# Patient Record
Sex: Male | Born: 1940 | Race: White | Hispanic: No | Marital: Married | Smoking: Former smoker
Health system: Southern US, Community
[De-identification: ages and names within clinical notes are randomized; demographics above are authoritative.]

## PROBLEM LIST (undated history)

## (undated) DIAGNOSIS — E119 Type 2 diabetes mellitus without complications: Secondary | ICD-10-CM

## (undated) DIAGNOSIS — I2510 Atherosclerotic heart disease of native coronary artery without angina pectoris: Secondary | ICD-10-CM

## (undated) DIAGNOSIS — J45909 Unspecified asthma, uncomplicated: Secondary | ICD-10-CM

## (undated) DIAGNOSIS — J449 Chronic obstructive pulmonary disease, unspecified: Secondary | ICD-10-CM

## (undated) DIAGNOSIS — Z951 Presence of aortocoronary bypass graft: Secondary | ICD-10-CM

## (undated) DIAGNOSIS — C61 Malignant neoplasm of prostate: Secondary | ICD-10-CM

## (undated) DIAGNOSIS — E785 Hyperlipidemia, unspecified: Secondary | ICD-10-CM

## (undated) DIAGNOSIS — Z9289 Personal history of other medical treatment: Secondary | ICD-10-CM

## (undated) HISTORY — DX: Chronic obstructive pulmonary disease, unspecified: J44.9

## (undated) HISTORY — PX: TONSILLECTOMY: SUR1361

## (undated) HISTORY — PX: PROSTATE BIOPSY: SHX241

## (undated) HISTORY — DX: Hyperlipidemia, unspecified: E78.5

## (undated) HISTORY — DX: Presence of aortocoronary bypass graft: Z95.1

## (undated) HISTORY — DX: Unspecified asthma, uncomplicated: J45.909

## (undated) HISTORY — DX: Atherosclerotic heart disease of native coronary artery without angina pectoris: I25.10

## (undated) HISTORY — PX: NASAL SEPTOPLASTY W/ TURBINOPLASTY: SHX2070

## (undated) HISTORY — DX: Malignant neoplasm of prostate: C61

## (undated) HISTORY — DX: Type 2 diabetes mellitus without complications: E11.9

## (undated) HISTORY — DX: Personal history of other medical treatment: Z92.89

---

## 1999-07-21 ENCOUNTER — Ambulatory Visit (HOSPITAL_COMMUNITY): Admission: RE | Admit: 1999-07-21 | Discharge: 1999-07-21 | Payer: Self-pay | Admitting: Internal Medicine

## 2001-03-19 HISTORY — PX: CARDIAC CATHETERIZATION: SHX172

## 2001-10-06 ENCOUNTER — Encounter: Payer: Self-pay | Admitting: Cardiology

## 2001-10-06 ENCOUNTER — Encounter: Admission: RE | Admit: 2001-10-06 | Discharge: 2001-10-06 | Payer: Self-pay | Admitting: Cardiology

## 2001-10-09 ENCOUNTER — Ambulatory Visit (HOSPITAL_COMMUNITY): Admission: RE | Admit: 2001-10-09 | Discharge: 2001-10-09 | Payer: Self-pay | Admitting: Cardiology

## 2001-10-20 ENCOUNTER — Encounter: Payer: Self-pay | Admitting: Cardiology

## 2001-10-20 ENCOUNTER — Ambulatory Visit (HOSPITAL_COMMUNITY): Admission: RE | Admit: 2001-10-20 | Discharge: 2001-10-20 | Payer: Self-pay | Admitting: Cardiology

## 2006-03-19 DIAGNOSIS — Z951 Presence of aortocoronary bypass graft: Secondary | ICD-10-CM

## 2006-03-19 HISTORY — DX: Presence of aortocoronary bypass graft: Z95.1

## 2006-05-29 ENCOUNTER — Encounter: Admission: RE | Admit: 2006-05-29 | Discharge: 2006-05-29 | Payer: Self-pay | Admitting: Cardiology

## 2006-05-30 ENCOUNTER — Inpatient Hospital Stay (HOSPITAL_COMMUNITY): Admission: RE | Admit: 2006-05-30 | Discharge: 2006-06-04 | Payer: Self-pay | Admitting: Cardiology

## 2006-05-30 ENCOUNTER — Ambulatory Visit: Payer: Self-pay | Admitting: Cardiothoracic Surgery

## 2006-05-30 ENCOUNTER — Encounter: Payer: Self-pay | Admitting: Vascular Surgery

## 2006-05-31 HISTORY — PX: CORONARY ARTERY BYPASS GRAFT: SHX141

## 2006-06-21 ENCOUNTER — Ambulatory Visit: Payer: Self-pay | Admitting: Cardiothoracic Surgery

## 2006-06-21 ENCOUNTER — Encounter: Admission: RE | Admit: 2006-06-21 | Discharge: 2006-06-21 | Payer: Self-pay | Admitting: Cardiothoracic Surgery

## 2006-06-27 ENCOUNTER — Encounter (HOSPITAL_COMMUNITY): Admission: RE | Admit: 2006-06-27 | Discharge: 2006-09-25 | Payer: Self-pay | Admitting: Cardiology

## 2006-07-19 ENCOUNTER — Encounter: Admission: RE | Admit: 2006-07-19 | Discharge: 2006-07-19 | Payer: Self-pay | Admitting: Cardiology

## 2006-07-23 ENCOUNTER — Ambulatory Visit (HOSPITAL_COMMUNITY): Admission: RE | Admit: 2006-07-23 | Discharge: 2006-07-23 | Payer: Self-pay | Admitting: Cardiothoracic Surgery

## 2006-10-01 ENCOUNTER — Encounter (HOSPITAL_COMMUNITY): Admission: RE | Admit: 2006-10-01 | Discharge: 2006-12-30 | Payer: Self-pay | Admitting: Cardiology

## 2007-01-18 ENCOUNTER — Encounter (HOSPITAL_COMMUNITY): Admission: RE | Admit: 2007-01-18 | Discharge: 2007-03-18 | Payer: Self-pay | Admitting: Cardiology

## 2007-03-20 ENCOUNTER — Encounter (HOSPITAL_COMMUNITY): Admission: RE | Admit: 2007-03-20 | Discharge: 2007-06-18 | Payer: Self-pay | Admitting: Cardiology

## 2007-06-19 ENCOUNTER — Encounter (HOSPITAL_COMMUNITY): Admission: RE | Admit: 2007-06-19 | Discharge: 2007-08-17 | Payer: Self-pay | Admitting: Cardiology

## 2007-10-08 DIAGNOSIS — Z9289 Personal history of other medical treatment: Secondary | ICD-10-CM

## 2007-10-08 HISTORY — DX: Personal history of other medical treatment: Z92.89

## 2008-07-02 ENCOUNTER — Inpatient Hospital Stay (HOSPITAL_COMMUNITY): Admission: AD | Admit: 2008-07-02 | Discharge: 2008-07-06 | Payer: Self-pay | Admitting: Internal Medicine

## 2008-07-08 ENCOUNTER — Inpatient Hospital Stay (HOSPITAL_COMMUNITY): Admission: AD | Admit: 2008-07-08 | Discharge: 2008-07-13 | Payer: Self-pay | Admitting: Internal Medicine

## 2010-06-18 HISTORY — PX: OTHER SURGICAL HISTORY: SHX169

## 2010-06-21 ENCOUNTER — Other Ambulatory Visit: Payer: Self-pay | Admitting: Internal Medicine

## 2010-06-21 DIAGNOSIS — R413 Other amnesia: Secondary | ICD-10-CM

## 2010-06-28 ENCOUNTER — Ambulatory Visit
Admission: RE | Admit: 2010-06-28 | Discharge: 2010-06-28 | Disposition: A | Discharge status: Home / Self Care | Payer: Medicare Other | Source: Ambulatory Visit | Attending: Internal Medicine | Admitting: Internal Medicine

## 2010-06-28 DIAGNOSIS — R413 Other amnesia: Secondary | ICD-10-CM

## 2010-06-28 LAB — GLUCOSE, CAPILLARY
Glucose-Capillary: 117 mg/dL — ABNORMAL HIGH (ref 70–99)
Glucose-Capillary: 118 mg/dL — ABNORMAL HIGH (ref 70–99)
Glucose-Capillary: 131 mg/dL — ABNORMAL HIGH (ref 70–99)
Glucose-Capillary: 105 mg/dL — ABNORMAL HIGH (ref 70–99)
Glucose-Capillary: 111 mg/dL — ABNORMAL HIGH (ref 70–99)
Glucose-Capillary: 115 mg/dL — ABNORMAL HIGH (ref 70–99)
Glucose-Capillary: 121 mg/dL — ABNORMAL HIGH (ref 70–99)
Glucose-Capillary: 122 mg/dL — ABNORMAL HIGH (ref 70–99)
Glucose-Capillary: 124 mg/dL — ABNORMAL HIGH (ref 70–99)
Glucose-Capillary: 153 mg/dL — ABNORMAL HIGH (ref 70–99)
Glucose-Capillary: 157 mg/dL — ABNORMAL HIGH (ref 70–99)
Glucose-Capillary: 177 mg/dL — ABNORMAL HIGH (ref 70–99)
Glucose-Capillary: 98 mg/dL (ref 70–99)

## 2010-06-28 LAB — COMPREHENSIVE METABOLIC PANEL
AST: 18 U/L (ref 0–37)
Albumin: 2.7 g/dL — ABNORMAL LOW (ref 3.5–5.2)
Alkaline Phosphatase: 45 U/L (ref 39–117)
Alkaline Phosphatase: 45 U/L (ref 39–117)
BUN: 15 mg/dL (ref 6–23)
BUN: 22 mg/dL (ref 6–23)
CO2: 25 mEq/L (ref 19–32)
CO2: 26 mEq/L (ref 19–32)
Calcium: 8.1 mg/dL — ABNORMAL LOW (ref 8.4–10.5)
Calcium: 9 mg/dL (ref 8.4–10.5)
Calcium: 9.2 mg/dL (ref 8.4–10.5)
Chloride: 106 mEq/L (ref 96–112)
Chloride: 108 mEq/L (ref 96–112)
Creatinine, Ser: 0.68 mg/dL (ref 0.4–1.5)
Creatinine, Ser: 0.71 mg/dL (ref 0.4–1.5)
GFR calc Af Amer: >60 mL/min (ref >60)
GFR calc Af Amer: >60 mL/min (ref >60)
GFR calc non Af Amer: >60 mL/min (ref >60)
GFR calc non Af Amer: >60 mL/min (ref >60)
Glucose, Bld: 92 mg/dL (ref 70–99)
Potassium: 3.4 mEq/L — ABNORMAL LOW (ref 3.5–5.1)
Potassium: 4 mEq/L (ref 3.5–5.1)
Potassium: 3.9 mEq/L (ref 3.5–5.1)
Total Protein: 6.8 g/dL (ref 6.0–8.3)

## 2010-06-28 LAB — CBC
HCT: 40.6 % (ref 39.0–52.0)
HCT: 39.3 % (ref 39.0–52.0)
HCT: 40 % (ref 39.0–52.0)
Hemoglobin: 12.7 g/dL — ABNORMAL LOW (ref 13.0–17.0)
Hemoglobin: 13.4 g/dL (ref 13.0–17.0)
Hemoglobin: 13.6 g/dL (ref 13.0–17.0)
MCHC: 34.7 g/dL (ref 30.0–36.0)
MCHC: 34.8 g/dL (ref 30.0–36.0)
MCHC: 34 g/dL (ref 30.0–36.0)
Platelets: 197 10*3/uL (ref 150–400)
RBC: 4.11 MIL/uL — ABNORMAL LOW (ref 4.22–5.81)
RBC: 4.42 MIL/uL (ref 4.22–5.81)
RBC: 4.48 MIL/uL (ref 4.22–5.81)
RDW: 12.2 % (ref 11.5–15.5)
RDW: 12.5 % (ref 11.5–15.5)
RDW: 12.6 % (ref 11.5–15.5)
WBC: 7.3 10*3/uL (ref 4.0–10.5)
WBC: 8 10*3/uL (ref 4.0–10.5)

## 2010-06-28 LAB — DIFFERENTIAL
Eosinophils Absolute: 0.3 10*3/uL (ref 0.0–0.7)
Eosinophils Relative: 4 % (ref 0–5)
Lymphocytes Relative: 28 % (ref 12–46)
Lymphs Abs: 2 10*3/uL (ref 0.7–4.0)
Monocytes Absolute: 0.7 10*3/uL (ref 0.1–1.0)
Monocytes Relative: 10 % (ref 3–12)

## 2010-06-28 LAB — CULTURE, BLOOD (ROUTINE X 2): Culture: NO GROWTH

## 2010-06-28 LAB — WOUND CULTURE

## 2010-06-28 LAB — BASIC METABOLIC PANEL
Calcium: 8.6 mg/dL (ref 8.4–10.5)
GFR calc Af Amer: >60 mL/min (ref >60)
GFR calc non Af Amer: >60 mL/min (ref >60)
Glucose, Bld: 127 mg/dL — ABNORMAL HIGH (ref 70–99)
Potassium: 4 mEq/L (ref 3.5–5.1)
Sodium: 137 mEq/L (ref 135–145)

## 2010-06-28 LAB — HIGH SENSITIVITY CRP: CRP, High Sensitivity: 161.5 mg/L — ABNORMAL HIGH

## 2010-06-28 LAB — SEDIMENTATION RATE: Sed Rate: 21 mm/hr — ABNORMAL HIGH (ref 0–16)

## 2010-08-01 NOTE — Discharge Summary (Signed)
[name], [name]                ACCOUNT NO.:  [id number]   MEDICAL RECORD NO.:  [id number]          PATIENT TYPE:  INP   LOCATION:  1518                         FACILITY:  [facility]   PHYSICIAN:  Massie Maroon, MD        DATE OF BIRTH:  1941/02/25   DATE OF ADMISSION:  07/08/2008  DATE OF DISCHARGE:  07/13/2008                               DISCHARGE SUMMARY   DISCHARGE DIAGNOSES:  1. Cellulitis.  2. Infected olecranon bursa?   CONSULTATIONS:  With Dr. Bradly Bienenstock IV.   PROCEDURES:  Right elbow x-ray which showed degenerative changes of the  elbow without osteolysis suspicious for osteomyelitis, subcutaneous  edema consistent clinically with cellulitis.   HOSPITAL COURSE:  A 70 year old male with a history of CAD status post  CABG who complained of redness over the right elbow beginning June 29, 2008.  Apparently after he had fallen the prior Saturday there was a  slight warmth.  There was no history of gout, but at 1 time his former  PCP thought he might have had it.  The patient is not sure if he has had  gout in the past or not.  The patient denied any fevers or chills prior  to that time.  There was slight pain with right arm movement, and the  skin over the right elbow was draining some slight serosanguinous fluid.  This was sent from outpatient clinic for a Gram's stain and culture on  June 30, 2008.  The patient was treated with Augmentin. However, the  redness spread down his right arm into his forearm and it was slightly  swollen, so he was reevaluated on July 02, 2008 and then admitted for  IV antibiotics and treated with vancomycin and Zosyn April 16 to April  20.  During that time Gram stain cultures revealed Staphylococcus aureus  that was pansensitive except to penicillin.  The warmth and redness down  his right arm receded, and there was no swelling at the time of  discharge.  There was some slight serous drainage, but this had  decreased.  The patient had been  told not to move his right elbow  excessively. However, he was using it, and there was still some redness  surrounding the right elbow, so he was readmitted on July 08, 2008 for  persistent cellulitis, possible  olecranon bursa infection.  The patient  was restarted on vancomycin and Zosyn, and seen by Dr. Melvyn Novas on July 10, 2008 and put in a splint.  He did not require debridement of the  bursa or bursa removal.  The patient appeared to be doing better  clinically on the vancomycin and Zosyn, and will be discharged home on  Avelox again.  Wound culture on admission showed no organisms and no  growth.   PAST MEDICAL HISTORY:  1. CAD status post CABG on May 30, 2006 by Dr. Donata Clay.  2. Hypertension.  3. Hyperlipidemia.  4. Glucose intolerance.  5. Dislocation of the left elbow.  6. Cataracts.  7. BPH.  8. Cellulitis June 29, 2008.  PAST SURGICAL HISTORY:  1. CABG on May 30, 2006.  2. Deviated septum repair.  3. T and A.   SOCIAL HISTORY:  The patient was born in [place], [place], and grew up  in [place].  He is a former smoker and quit smoking in 2004, and  smoked 1 pack per day x40 years.  He does not smoke presently.  He does  not use alcohol.   FAMILY HISTORY:  Mother had diabetes.  Father died at age 23 from  Alzheimer's disease.  His father had a history of coronary artery  disease with a heart attack at age 15.  Of course, he was a smoker up to  the age of 1 and a pipe smoker until the age of 55.  His mother died at  age 16 of a stroke.  She had hypertension as well as diabetes.  Siblings:  The patient has 1 brother in good health.  Paternal  grandmother had question of CVA/dementia.  Maternal grandmother had a  stroke in the past.   ALLERGIES:  No known drug allergies.   DISCHARGE MEDICATIONS:  1. Pravastatin 40 mg p.o. q.h.s.  2. Lisinopril 5 mg p.o. daily.  3. Metformin 500 mg 1/2 p.o. daily.  4. Enteric-coated aspirin 325 mg p.o. daily.  5.  Folic acid 1 mg p.o. daily.  6. Fish oil 300 mg p.o. b.i.d.  7. Avelox 40 mg p.o. daily.   PHYSICAL EXAMINATION:  Temperature 97.7, pulse 58, blood pressure  151/88, pulse ox 96% on room air.  HEENT:  Anicteric, EOMI, no  nystagmus.  Pupils 1.5 mm, symmetric, direct, consensual, near reflexes  intact.  Mucous membranes moist.  NECK:  No JVD, no bruit, no thyromegaly.  HEART:  Regular rate and rhythm, S1, S2.  No murmurs, gallops or rubs.  LUNGS:  Clear to auscultation bilaterally.  ABDOMEN:  Soft, nontender, nondistended, positive bowel sounds.  EXTREMITIES:  No cyanosis, clubbing or edema.  Slight erythema over the  right elbow at 2 cm diameter possibly due to immobility, no warmth, no  tenderness, drainage has apparently almost ceased.  There is a pinpoint  spot of yellow on the Telfa.  NEUROLOGICAL:  Nonfocal.  LYMPH NODES:  No adenopathy.   LABORATORIES:  WBC 8.0, hemoglobin 13.4, platelet count 299.   These are labs on July 10, 2008.   BUN 13, creatinine 0.82, sodium 137, potassium 4.0.   ASSESSMENT:  1. Cellulitis.  2. Possibly infected olecranon bursa.  3. Coronary artery disease status post CABG.  4. Hypertension.  5. Hyperlipidemia.  6. Glucose intolerance.  7. History of recurrent dislocation of left elbow.  8. Cataracts.  9. Benign prostatic hypertrophy.  10.Cellulitis June 29, 2008.   PLAN:  The patient will finish IV antibiotic today, and be discharged on  Avelox 400 mg p.o. daily x1 week.  The patient will follow up with Dr.  Melvyn Novas tomorrow and his primary care physician in 1 week.  The patient  will try to continue to not move his elbow excessively.  Wife  understands how to dress his elbow.  Appreciate Dr. Glenna Durand input.      Massie Maroon, MD  Electronically Signed     JYK/MEDQ  D:  07/13/2008  T:  07/13/2008  Job:  [id number]   cc:   Madelynn Done, MD  Fax: [phone]

## 2010-08-01 NOTE — Discharge Summary (Signed)
[name], [name]                ACCOUNT NO.:  [id number]   MEDICAL RECORD NO.:  [id number]          PATIENT TYPE:  INP   LOCATION:  6734                         FACILITY:  [facility]   PHYSICIAN:  Massie Maroon, MD        DATE OF BIRTH:  29-Apr-1940   DATE OF ADMISSION:  07/02/2008  DATE OF DISCHARGE:  07/06/2008                               DISCHARGE SUMMARY   DISCHARGE DIAGNOSES:  1. Cellulitis.  2. Hypokalemia.  3. Coronary artery disease status post coronary artery bypass graft on      May 30, 2006 by Dr. Donata Clay.  4. Hypertension.  5. Hyperlipidemia.  6. Glucose intolerance.  7. Recurrent dislocation of the left elbow.  8. Cataracts.  9. Benign prostatic hypertrophy.   PROCEDURES:  None.   ALLERGIES:  No known drug allergies.   DISCHARGE MEDICATIONS:  1. Avelox 400 mg p.o. daily x7 days.  2. Pravastatin 40 mg p.o. at bedtime.  3. Lexapro 5 mg p.o. daily.  4. Metformin 500 mg p.o. daily.  5. Enteric-coated aspirin 325 mg p.o. daily.  6. Folic acid 1 mg p.o. daily.  7. Fish oil 300 mg p.o. b.i.d.   HOSPITAL COURSE:  A 70 year old male with a history of CAD, status post  coronary artery bypass graft who complained of redness over the right  elbow.  Redness began on June 29, 2008.  The patient had apparently  fallen the prior Saturday and there was slight warmth.  There was no  definitive diagnosis of gout that at one time his prior physician, Dr.  Dimas [place] thought he might have it.  There was leg pain with  right arm movement initially and the skin over the right elbow was  draining some slight serosanguineous fluid.  This was aspirated in the  clinic on June 30, 2008 and sent for Gram-stain culture and the patient  was treated with Augmentin 875 mg p.o. b.i.d., however, the redness  spread down his right arm to his forearm and the patient was reevaluated  on July 02, 2008.  The patient was then admitted for IV antibiotics  after having failed p.o.  antibiotics.  The patient was started on  vancomycin 1 gram IV b.i.d. as well as Zosyn 3.375 grams IV q.6 h.  The  patient was treated empirically while awaiting cultures.  The patient  remained afebrile through the course of hospitalization.  He was still  having some serous drainage from his right elbow but this was decreasing  throughout the course of his stay.  His Gram-stain and culture came back  positive for Staphylococcus aureus, however, it was pansensitive except  for penicillin.  The warmth and redness down his right forearm receded  and at the time of discharge, there was no apparent evidence of  cellulitis like on his forearm nor his elbow.  There was some slight  serous drainage but this was decreased markedly and almost completely  stopped.  The patient has been moving his right elbow excessively and he  has been warned not to do this so that his elbow  can heal.   PAST MEDICAL HISTORY/PAST SURGICAL HISTORY/SOCIAL HISTORY/FAMILY  HISTORY:  Please see dictated H and P on July 02, 2008.   REVIEW OF SYSTEMS:  Negative for fever, negative for all 10 organ  systems except for pertinent positives as stated above.   PHYSICAL EXAMINATION:  VITAL SIGNS:  Temperature 98.0, pulse 62, blood  pressure 143/77, and pulse ox 100% on room air.  HEENT:  Anicteric, EOMI, no nystagmus, pupils 1.5 mm, symmetric, direct,  consensual reflexes intact.  NECK:  No JVD, no bruit, no thyromegaly.  HEART:  Regular rate and rhythm.  S1-S2.  No murmurs, gallops, or rubs.  LUNGS:  Clear to auscultation bilaterally.  ABDOMEN:  Soft, obese, nontender, and nondistended.  Positive bowel  sounds.  EXTREMITIES:  No cyanosis, clubbing or edema, swelling in the right  forearm has gone down to normal.  The patient has good movement of his  elbow.  There is some slight serous drainage from his right elbow  opening where he apparently had an abrasion.  It is very minimal at this  point.  NEUROLOGIC:   Nonfocal.  LYMPH NODES:  No adenopathy.   LABORATORY DATA:  July 04, 2008, uric acid 4.1.  Sodium 138, potassium  3.4, BUN 13, and creatinine 0.71.  AST 16, ALT 15, albumin 2.7, and  calcium 8.1.  WBC 7.3, hemoglobin 12.7, and platelet count 210.  July 02, 2008, ESR 21 and CRP 161.5.   ASSESSMENT:  1. Right elbow, forearm cellulitis refractory to Augmentin p.o.      antibiotic.  2. Hypokalemia.  3. Anemia.  4. Hypertension.  5. Hyperlipidemia.  6. Glucose intolerance.  7. Coronary artery disease status post coronary artery bypass graft,      May 30, 2006 by Dr. Donata Clay.  8. History of recurrent dislocation of left elbow.  9. Cataracts.  10.Benign prostatic hypertrophy.   PLAN:  The patient will be discharged on Avelox 400 mg p.o. daily x7  days for cellulitis.  Cellulitis should be sensitive to this since it  was staph aureus pansensitive except for penicillin.  Blood cultures x2  sets were negative for growth.  The patient will try to not move his  right elbow much.  He will try to move it minimally.  He can try to keep  it in a sling for a few days.  The patient will use bacitracin or triple  antibiotic ointment or Neosporin topically and cover with Telfa and keep  it wrapped until the wound is healed.  The patient should followup with  Dr. Pearson Grippe in 1 week.     Massie Maroon, MD  Electronically Signed    JYK/MEDQ  D:  07/06/2008  T:  07/07/2008  Job:  [id number]

## 2010-08-01 NOTE — H&P (Signed)
[name], [name]                ACCOUNT NO.:  [id number]   MEDICAL RECORD NO.:  [id number]          PATIENT TYPE:  OBV   LOCATION:  6729                         FACILITY:  [facility]   PHYSICIAN:  Massie Maroon, MD        DATE OF BIRTH:  05-29-1940   DATE OF ADMISSION:  07/02/2008  DATE OF DISCHARGE:                              HISTORY & PHYSICAL   CHIEF COMPLAINT:  Cellulitis.   HISTORY OF PRESENT ILLNESS:  A 70 year old male with a history of CAD,  status post CABG, who complains of redness over the elbow.  The redness  began on June 29, 2008.  He had apparently fallen the prior Saturday.  There was a slight warmth.  There is no history of gout.  Though at one-  time, Dr. Lendell Caprice, his previous primary care physician thought he might  have it.  The patient denies any fever, chills.  There is slight pain  with right arm movement.  The skin over the right elbow was draining  some slight serosanguineous fluid.  This was aspirated in the clinic  when he was seen on June 30, 2008, and sent for Gram stain and culture.  The patient was given Augmentin on June 30, 2008, 875 mg p.o. b.i.d. to  treat the cellulitis.  However, the redness spread down his arm into his  forearm.  The patient came to clinic on July 02, 2008, for reevaluation  and was noted to have spreading cellulitis.  The patient will be  admitted for observation and IV antibiotics.   PAST MEDICAL HISTORY:  1. CAD, status post CABG in May 30, 2006, by Dr. Donata Clay.  2. Hypertension.  3. Hyperlipidemia.  4. Glucose intolerance.  5. Recurrent dislocation of the left elbow.  6. Cataracts.  7. BPH.   PAST SURGICAL HISTORY:  1. CABG on May 30, 2006.  2. Deviated septum repair.  3. T&A.   SOCIAL HISTORY:  The patient was born in [place] and grew up  in [place].  He is a former smoker, who quit smoking in 2004 and  smoked about one pack per day x40 years.  He does not smoke presently  and he does not  use alcohol.   FAMILY HISTORY:  Mother had diabetes.  Father died at age 54 from  Alzheimer disease.  His father had a history of coronary artery disease  with heart attack at age 22.  Of course, he was a smoker up to age 42  and a pipe smoker until the age of 16.  His mother died at age 66 of a  stroke.  She had hypertension as well as diabetes.  Siblings:  The  patient has one brother in good health.  A paternal grandmother had?  CVA/dementia.  Maternal grandmother had a stroke in the past.   ALLERGIES:  No known drug allergies.   MEDICATIONS:  1. Pravastatin 40 mg p.o. nightly.  2. Lisinopril 5 mg p.o. daily.  3. Metformin 500 mg 1 p.o. daily.  4. Enteric-coated aspirin 325 mg p.o. daily.  5. Folic  acid 1 mg p.o. daily.  6. Fish oil 300 mg p.o. b.i.d.   REVIEW OF SYSTEMS:  Negative for fever, chills, chest pain,  palpitations, shortness of breath, nausea, vomiting, diarrhea.  Review  of systems is negative for all 10 organ systems except for pertinent  positives stated above.   PHYSICAL EXAMINATION:  VITAL SIGNS:  Temperature afebrile, 98.2, pulse  82, blood pressure 143/80, and pulse ox 96% on room air.  HEENT:  Anicteric, EOMI, no nystagmus, pupils 1.5 mm, symmetric, direct,  consensual reflexes intact.  Mucous membranes moist.  Tongue midline.  NECK:  No JVD, no bruit, no thyromegaly.  HEART:  Midline scar.  Regular rate and rhythm.  S1 and S2.  No murmurs,  gallops, or rubs.  LUNGS:  Clear to auscultation bilaterally.  ABDOMEN:  Soft, nontender, nondistended.  Positive bowel sounds.  EXTREMITIES:  There is very minimal swelling of the right forearm,  slight erythema on the lateral and medial aspect of the forearm.  There  is a slight opening over the right elbow, which drained some  serosanguinous fluid.  There is good range of motion at the right elbow.  There is no complaint of pain with movement of the right elbow on  passive or active motion.  The patient has good  pulses, radial and ulnar  in the right wrist.  NEUROLOGIC:  No resting tremor, cranial nerves II through XII intact,  reflexes 2+, symmetric, diffuse with downgoing toes bilaterally, motor  strength 5/5 in all 4 extremities, pinprick intact, monofilament intact.  LYMPH NODES:  No adenopathy.   LABORATORY DATA:  Blood cultures x2 sets are pending.  WBC 12.3,  hemoglobin 14.1, and platelet count 197.   Sodium 141, potassium 4.0, chloride 108, bicarb 25, BUN 15, creatinine  0.68, AST 18, and ALT 23.   Gram stain culture of the right elbow fluid is still pending.   ASSESSMENT:  1. Cellulitis.  2. Hypertension.  3. Hyperlipidemia.  4. Glucose intolerance.  5. Coronary artery disease, status post coronary artery bypass graft.  6. Benign prostatic hypertrophy.  7. History of recurrent dislocation of left elbow.   PLAN:  The patient has obviously failed outpatient oral antibiotics.  The patient will be started on vanco 1 g IV q.12 h. along with Zosyn  3.375 g IV q.6 h.  Blood cultures and wound culture are pending.  We  will tailor antibiotics based on the studies.  The patient's last  fingerstick blood sugars b.i.d.  He will be covered by insulin sliding  scale.  For his hyperlipidemia, we will continue him on his Vytorin.  For his  hypertension, we will continue on lisinopril 5 mg p.o. daily.  The  patient is to remain on aspirin for his coronary artery disease.  For  DVT prophylaxis, we will use Lovenox 40 mg subcu daily.      Massie Maroon, MD  Electronically Signed     JYK/MEDQ  D:  07/02/2008  T:  07/03/2008  Job:  [id number]   cc:   Thereasa Solo. Little, M.D.

## 2010-08-01 NOTE — H&P (Signed)
[name], [name]                ACCOUNT NO.:  [id number]   MEDICAL RECORD NO.:  [id number]          PATIENT TYPE:  INP   LOCATION:  1518                         [facility]ACILITY[facility]   PHYSICIAN:  Massie Maroon, MD        DATE OF BIRTH:  04/19/1940   DATE OF ADMISSION:  07/08/2008  DATE OF DISCHARGE:                              HISTORY & PHYSICAL   CHIEF COMPLAINT:  Redness, right elbow.   HISTORY OF PRESENT ILLNESS:  A 70 year old male with a history of CAD  status post CABG who complained of redness over the right elbow.  The  redness apparently began June 29, 2008, after he had apparently fallen  the prior Saturday.  There was some slight warmth.  There was no history  of gout.  At one time, Dr. Lendell Caprice, his primary care physician,  however, thought he might have had it.  So, the patient was not sure if  he had gout or not.  The patient denied any fever or chills prior to  that time.  There was slight pain with right arm movement, and the skin  over the right elbow was draining some slight serosanguineous fluid.  This was sent from the outpatient clinic for Gram's stain and culture.  This was on June 30, 2008.  The patient was given Augmentin June 30, 2008, 875 mg p.o. b.i.d. to treat cellulitis.  However, the redness  spread down his right arm into his forearm, and it was slightly swollen.  Thus, he came back to the clinic on July 02, 2008, for reevaluation.  The patient was admitted on July 02, 2008, for spreading cellulitis  refractory to p.o. antibiotics.  The patient was treated with vancomycin  and Zosyn during his inpatient admission from April 16 to July 06, 2008.  During that time, the Gram stain culture revealed Staphylococcus  aureus which was pansensitive except to penicillin.  The warmth and  redness down his right arm receded, and there was no swelling at the  time of discharge.  There was some slight serous drainage, but this had  decreased and almost  completely stopped.  The patient had been told not  to move his right elbow excessively.  However, he has been using it  according to his wife.  She noted some redness surrounding the right  elbow, and this was persistent besides being on p.o. Avelox which he has  been taking since discharge on July 06, 2008.  The patient will be  readmitted for cellulitis, rule out elbow infection.   PAST MEDICAL HISTORY:  1. CAD status post CABG on May 30, 2006, by Dr. Donata Clay.  2. Hypertension.  3. Hyperlipidemia.  4. Glucose intolerance.  5. Recurrent dislocation of left elbow.  6. Cataracts.  7. BPH.  8. Cellulitis June 29, 2008.   PAST SURGICAL HISTORY:  1. CABG on May 30, 2006.  2. Deviated septum repair.  3. T and A.   SOCIAL HISTORY:  The patient was born in [place], [place], and grew up  in [place].  He is a  former smoker and quit smoking in 2004 and smoked  1 pack per day for 40 years.  He does not smoke presently.  He does not  use alcohol.   FAMILY HISTORY:  Mother had diabetes.  Father died at age 6 from  Alzheimer's disease.  His father had a history of coronary artery  disease with a heart attack at age 35.  Of course, he was a smoker up to  age 88 and a pipe smoker until the age of 2.  His mother died at age 28  of a stroke.  She had hypertension as well as diabetes.  Siblings:  The  patient has one brother in good health.  Paternal grandmother had  question CVA/dementia.  Maternal grandmother had stroke in the past.   ALLERGIES:  No known drug allergies.   MEDICATIONS:  1. Pravastatin 40 mg p.o. every night.  2. Lisinopril 5 mg p.o. daily.  3. Metformin 500 mg 1 p.o. daily.  4. Enteric-coated aspirin 325 mg p.o. daily.  5. Folic acid 1 mg p.o. daily.  6. Fish oil 300 mg p.o. b.i.d.  7. Avelox 40 mg p.o. daily.  8. Lexapro 5 mg p.o. daily.   REVIEW OF SYSTEMS:  Negative for all 10 organ systems except for  pertinent positives stated above.  Specifically,  no fever, no chills.   PHYSICAL EXAMINATION:  Temperature 97.7, pulse 62, blood pressure  137/82, pulse oximetry 98% on room air.  HEENT:  Anicteric, EOMI, no nystagmus, pupils 1.5 mm, symmetric, direct,  consensual, near reflex intact.  Mucous membranes moist.  NECK:  No JVD, no bruit, no thyromegaly, no adenopathy.  HEART:  Midline scar, regular rate and rhythm.  S1-S2.  No murmurs,  gallops or rubs.  LUNGS:  Clear to auscultation bilaterally.  ABDOMEN:  Is soft, nontender, nondistended.  Positive bowel sounds.  EXTREMITIES:  No cyanosis, clubbing or edema.  There is a 0.5-mm pinhole  that is draining serous fluid from the right elbow, slight redness  around the right elbow, no warmth.  The patient has good range of motion  of the right elbow, no complaints of pain with movement.  NEUROLOGIC EXAM:  Nonfocal.  LYMPH NODES:  No adenopathy.  SKIN:  A 0.75 x 1.5-cm nodule on the inner aspect of the right elbow  which feels either like a lipoma or a possible hematoma.   LABORATORY DATA:  WBC 9.7, hemoglobin 13.6, platelet count 284.  Sodium  144, potassium 3.9, chloride 109, bicarb 26, BUN 22, creatinine 0.8, AST  48, ALT 47, calcium 9.2.   ASSESSMENT:  1. Cellulitis.  2. Coronary artery disease status post coronary artery bypass grafting      on May 30, 2006.  3. Hypertension.  4. Hyperlipidemia.  5. Glucose intolerance.  6. Recurrent dislocation of the left elbow.  7. Cataracts.  8. Benign prostatic hypertrophy.   PLAN:  The patient will be restarted on vancomycin and Zosyn.  We will  obtain a wound consult as well as orthopedic consult for evaluation of  his right elbow.  We will use triple antibiotic ointment or Neosporin  topically along with Telfa to cover the wound until healed.  We  appreciate the input from our wound care team as well as orthopedic  physicians.  Will obtain an x-ray of the elbow.  For DVT prophylaxis, we  will use SCDs and TEDS.  At the present time,  the patient is ambulatory.      Massie Maroon,  MD  Electronically Signed     JYK/MEDQ  D:  07/08/2008  T:  07/08/2008  Job:  [id number]   cc:   Massie Maroon, MD  Fax: [phone]

## 2010-08-01 NOTE — Consult Note (Signed)
NAMEANTUANE, EASTRIDGE                ACCOUNT NO.:  [id number]   MEDICAL RECORD NO.:  [id number]          PATIENT TYPE:  INP   LOCATION:  1518                         FACILITY:  [facility]   PHYSICIAN:  Madelynn Done, MD  DATE OF BIRTH:  October 15, 1940   DATE OF CONSULTATION:  07/10/2008  DATE OF DISCHARGE:                                 CONSULTATION   REASON FOR INPATIENT HOSPITAL CONSULTATION:  Right elbow olecranon  bursitis.   REQUESTING PHYSICIAN:  Dr. Pearson Grippe.   HISTORY:  Mr. Tatsch is a 70 year old right-hand dominant gentleman who  presented to his physician on June 29, 2008 with a painful swollen  right elbow.  The patient was then placed on oral antibiotics.  He has  pain and swelling increasing and he represented with hot draining from  his right elbow.  He was admitted to the hospital for a short course of  IV antibiotics and care.  He was discharged from the hospital after  this, subsequently went down and responded to treatment.  He was  followed up in the office and on Thursday, July 08, 2008 was noted to  still have a small amount of serous drainage from his elbow and some  redness and was readmitted back to [facility] for evaluation.  He was continued on the IV antibiotics he had been previously on.  For  himself and his wife, the cultures grew out staph and said it was not  MRSA.  He has no history of infections of the elbow.  He has otherwise  been in good health with no other reasons for hospitalization.   I was consulted for evaluation of the elbow given that it still has not  completely resolved in terms of the small amount of drainage that is  present and the small amount of redness.   PAST MEDICAL HISTORY:  Coronary artery disease, hypertension.   PAST SURGICAL HISTORY:  Coronary artery bypass grafting, deviated septum  and tonsils and adenoids.   MEDICATIONS:  See current medication list.   ALLERGIES:  NO KNOWN DRUG ALLERGIES.   SOCIAL  HISTORY:  He and his wife both live here in [place].  He is a  nonsmoker.   REVIEW OF SYSTEMS:  No recent illnesses or hospitalization.   EXAMINATION:  He is a healthy-appearing male.  Afebrile.  Vital signs are stable and normal.  He is alert and oriented to person, place and time, in no acute  distress.   On examination of the right upper extremity, the patient does have a  small amount of focal redness around the elbow olecranon surface.  It  does not extend up the arm.  There is no lymphangitis.  He has full  elbow motion that is pain free.  He has full forearm rotation that is  pain free.  He is able to flex and extend his wrist without pain.  He is  able to extend his thumb and extend his digits.  Fingertips are warm,  well-perfused, good capillary refill, good station.  He does have a  small area, about the  size is of a pinhole, over the olecranon surface  with a small amount of serous drainage, it did not appear to be  infected.   IMPRESSION:  Right elbow, septic bursitis, improved with antibiotics  with small amount of serous drainage.   PLAN:  Today the findings were reviewed with  Mr. Prien as well as his  wife. The patient has what sounds like a septic bursitis that  spontaneously drained.  It seems as though it responded to the oral and  IV antibiotic coverage, not completely resolved.  We talked about the  options at the bedside.  We talked about continuing the antibiotics and  a short course of immobilization versus opening the elbow up and  debriding what remains of the olecranon bursa.  After talking with them  today in detail, we have elected to try a course of immobilization and  continue the oral antibiotics that he has been on.  We talked about  doing daily dressing changes and continuing with the splint.  The wife  was at the bedside and was shown how to do the dressings and splint.  She is a former Engineer, civil (consulting) and voiced understanding of this procedure.  We   talked about if it starts getting more red and more swollen or if he  starts having any subjective fevers and chills, he needs to come back  in.  They were given my contact information to reach me if there are any  problems.  If any problems arise, the patient may require further  intervention, but they would like to consider trying again finishing up  a course of nonoperative treatment to avoid any further invasive  intervention for his elbow.  All questions were answered and encouraged.  They are going to come back and see me in the office on Wednesday for  evaluation and check.   The patient can go home on oral antibiotics and they are in agreement  with this.      Madelynn Done, MD  Electronically Signed     FWO/MEDQ  D:  07/11/2008  T:  07/11/2008  Job:  [id number]   cc:   Fayrene Fearing, MD Kin

## 2010-08-04 NOTE — Consult Note (Signed)
[name], [name] NO.:  [id number]   MEDICAL RECORD NO.:  [id number]          PATIENT TYPE:  INP   LOCATION:  2308                         FACILITY:  [facility]   PHYSICIAN:  Kerin Perna, M.D.  DATE OF BIRTH:  Oct 28, 1940   DATE OF CONSULTATION:  05/30/2006  DATE OF DISCHARGE:                                 CONSULTATION   REFERRING PHYSICIAN:  Thereasa Solo. Little, M.D.   PRIMARY CARE PHYSICIAN:  Janae Bridgeman. Lendell Caprice, M.D.   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   CHIEF COMPLAINT:  Chest pain.   HISTORY OF PRESENT ILLNESS:  I was asked to evaluate this 70 year old,  white male smoker for potential surgical coronary revascularization for  recently diagnosed severe three-vessel coronary disease.  The patient  had an abnormal stress test in 2003, and had a catheterization which  showed a chronically occluded LAD, but with good collateralization and  good LV function.  He had been treated medically until recently when he  developed exertional chest pain going up stairs or hills or with  increased effort.  The stress test repeated recently showed increased  ischemic changes and he underwent elective outpatient catheterization  today by Dr. Clarene Duke.  This demonstrated his occluded LAD, but with  decreased collateralization has he had a high-grade stenosis of the  circumflex, OM-2 90% and a 70% stenosis of the OM-1.  The distal right  coronary had a 70% stenosis and overall LV function was still well-  preserved.  LVEDP was 22 and there is no evidence of aortic stenosis or  mitral regurgitation.  Based on his coronary anatomy and symptoms, he  was felt to be a candidate for surgical coronary revascularization.   PAST MEDICAL HISTORY:  1. Hypertension.  2. Hyperlipidemia.  3. Active smoker.  4. No known allergies.  5. Status post nasoseptal repair under general anesthesia.  6. Three-vessel  coronary artery disease with Class III angina.   MEDICATIONS:  1. Cozaar 50 mg daily.  2. Aspirin 81 mg daily.  3. Toprol XL 25 mg daily.  4. Vytorin 10/20 one p.o. daily.  5. Vitamin C one tablet daily.   ALLERGIES:  No known drug allergies.   SOCIAL HISTORY:  He is retired and married.  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  He smokes half pack of cigarettes a day and drinks alcohol  infrequently.   FAMILY HISTORY:  Negative for diabetes or coronary disease.   REVIEW OF SYSTEMS:  CONSTITUTIONAL:  Negative for fever or weight loss.  ENT:  Negative for dental complaints of difficulty swallowing.  THORACIC:  Negative for chest trauma or abnormal chest x-ray.  CARDIAC:  Positive for Class III exertional tightness, but no resting symptoms of  orthopnea or PND.  GI:  Negative for hepatitis, jaundice or blood per  rectum.  GU:  Negative for kidney stones or BPH.  VASCULAR:  Negative  for DVT, claudication or TIA.  NEUROLOGIC:  Negative for stroke or  seizure.  HEMATOLOGIC:  Negative for anemia or bleeding disorder.  PHYSICAL EXAMINATION:  VITAL SIGNS:  Height 5 feet 6 inches, weight 150  pounds, blood pressure 130/60, pulse 80, respirations 18.  GENERAL:  A pleasant, middle-aged male in the catheterization lab  holding area in no distress.  HEENT:  Normocephalic, full EOMs.  Pharynx clear.  NECK:  Supple without JVD, mass or carotid bruit.  LYMPHATICS:  No palpable supraclavicular or cervical adenopathy.  LUNGS:  Breath sounds are clear and equal and there is no thoracic  deformity.  CARDIAC:  Regular rate and rhythm without S3 gallop or murmur.  ABDOMEN:  Soft, nontender without pulsatile mass or organomegaly.  EXTREMITIES:  No clubbing, cyanosis or edema.  Peripheral pulses are 2+  in all extremities.  SKIN:  Without rash or lesion.  NEUROLOGIC:  Alert and oriented without focal motor deficit.   LABORATORY DATA AND X-RAY FINDINGS:  The pre-CABG Doppler exam shows no   significant carotid artery stenosis.  ABIs are greater than 1  bilaterally and brachial artery pressures are equal in each upper  extremity.   I reviewed the coronary arteriogram with Dr. Clarene Duke and I agree with  this impression of severe three-vessel disease.   IMPRESSION/RECOMMENDATIONS:  The patient will be prepared for surgical  coronary revascularization for his severe three-vessel anatomy and Class  III progressive symptoms with an abnormal stress test.  I discussed the  procedure in detail with the patent and his wife and they understand and  agree to proceed with surgery with what I feel is an informed consent.   Thank you for the consultation.      Kerin Perna, M.D.  Electronically Signed     PV/MEDQ  D:  05/30/2006  T:  05/31/2006  Job:  [id number]

## 2010-08-04 NOTE — Discharge Summary (Signed)
[place]  Patient:    [name], [name] Visit Number: [id number] MRN: [id number]          Service Type: CAT Location: [facility] 2899 10 Attending Physician:  Loreli Dollar Dictated by:   Julieanne Manson, M.D. Admit Date:  10/09/2001 Discharge Date: 10/09/2001   CC:         Cardiac Catheterization Laboratory  Raymond C. Eloise Harman., M.D.   Discharge Summary  INDICATIONS FOR PROCEDURE: The patient is a 70 year old male who had a standard screening exercise treadmill test that was positive for anterior ischemia, brought in for outpatient cardiac catheterization. He is asymptomatic.  DESCRIPTION OF PROCEDURE: The patient was prepped and draped in the usual sterile fashion exposing the right groin.  Following local anesthetic with 1% Xylocaine, the Seldinger technique was employed and a 5 [place] introducer sheath was placed in the right femoral artery.  Selective left and right coronary arteriography, ventriculography in the RAO projection, and a distal aortogram was performed.  EQUIPMENT: The 5 French Judkins configuration catheters.  COMPLICATIONS: None.  TOTAL CONTRAST USED: 110.  RESULTS: 1. Hemodynamic monitoring: Central aortic pressure 157/83, left    ventricular pressure 158/15 with no significant aortic valve gradient noted    at the time of pullback. 2. Ventriculography: Ventriculography in the RAO projection revealed    normal left ventricular systolic function. The left ventricle is    well opacified. No mitral regurgitation was seen. Of note, the    anterior wall showed normal systolic function. Ejection fraction was    approximately 55%. The end-diastolic pressure was 15.  DISTAL AORTOGRAM: The distal aortogram showed only minimal irregularities. There was no evidence of renal artery stenosis and there were mild irregularities in the iliacs.  CORONARY ARTERIOGRAPHY: Calcification was seen on fluoroscopy in  the distribution of the left main LAD and right coronary artery. 1. Left main: Normal. 2. LAD: The LAD was 100% occluded proximally at the septal perforator. There    was late filling of the distal LAD via left to left collaterals. 3. Circumflex: The circumflex was a large vessel that gave rise to a hybrid    bifurcating large OM system. There was proximal 30% narrowing in the    circumflex. After the OM bifurcated into two large limbs, both the    limbs had some proximal 40% narrowing right at the bifurcation. The    remainder of the distal vessels were free of disease. 4. Right coronary artery: The right coronary artery had an area of 50%    narrowing in its midportion where calcification was noted. The remainder    of the irregularities in the distal vessel and in the PDA and    posterolateral branches were well less than 40%.  CONCLUSIONS: 1. Normal left ventricular systolic function, in particular normal anterior    wall motion. 2. Total occlusion of the left anterior descending with left to left    collaterals. 3. Moderate disease in the right coronary artery and mild disease in the    circumflex.  At this point, the patient will be managed medically. He needs a stress Cardiolite study to make sure he does not have a large anterior ischemic burden. He is asymptomatic and should not require any type of intervention to the LAD. My only concern is whether or not the lesion in the right could be causing some degree of ischemia, although there were no right to left collaterals noted. Dictated by:   Julieanne Manson, M.D.  Attending Physician:  Loreli Dollar DD:  10/09/01 TD:  10/13/01 Job: [id number]

## 2010-08-04 NOTE — Op Note (Signed)
NAMEHERNDON, GRILL                ACCOUNT NO.:  [id number]   MEDICAL RECORD NO.:  [id number]          PATIENT TYPE:  INP   LOCATION:  2308                         FACILITY:  [facility]   PHYSICIAN:  Kerin Perna, M.D.  DATE OF BIRTH:  1941-01-18   DATE OF PROCEDURE:  05/30/2006  DATE OF DISCHARGE:                               OPERATIVE REPORT   OPERATION:  Coronary artery bypass grafting x4 (left internal mammary  artery to LAD, sequential saphenous vein graft to OM 1 and OM 2,  saphenous vein graft to posterior descending) using endoscopically  harvested saphenous vein.   PREOPERATIVE DIAGNOSIS:  Class III progressive angina with three-vessel  disease.   POSTOPERATIVE DIAGNOSIS:  Class III progressive angina with three-vessel  disease.   SURGEON:  Kerin Perna, MD   ASSISTANT:  Gershon Crane, PA-C   ANESTHESIA:  General by Dr. Sharee Holster   INDICATIONS:  The patient is a 70 year old smoker with a history of  hyperlipidemia and hypertension with known coronary disease who  presented with increased exertional angina.  A stress test showed  ischemia anteriorly and an elective cath today showed chronically  occluded LAD with high-grade stenosis of the circumflex and a moderate  stenosis of the right coronary with reduced right to left collaterals to  the occluded LAD territory.  Because of his progress disease and  symptoms, he was felt to be a candidate for surgical revascularization.   Prior to surgery, I examined the patient in the cath lab holding area  and reviewed the results of the cath with the patient and his wife.  I  discussed indications, benefits and alternatives of coronary bypass  surgery, for treatment of his three-vessel coronary disease.  I reviewed  the risks of the operation including risks of MI, CVA, bleeding, blood  transfusion requirement, wound infection, and death.  After reviewing  the issues with the patient and family, he demonstrated his  understanding and agreed to proceed with the operation under what I felt  was an informed consent.   OPERATIVE FINDINGS:  The vein was harvested endoscopically from the  right leg and was small but was adequate.  The mammary artery was a good  vessel but small with good flow.  The coronaries were heavily diseased.  The LAD was occluded down to the distal third of its length where the  anastomosis was created.  The patient did not receive any blood products  for the operation.   PROCEDURE:  The patient was brought to the operating room and operative  placed supine on the operating table.  General anesthesia was induced  under invasive hemodynamic monitoring.  The chest, abdomen and legs were  prepped with Betadine and draped as a sterile field.  A sternal incision  was made.  The the saphenous vein was harvested endoscopically from the  right leg.  The left internal mammary artery was harvested as a pedicle  graft from its origin at the subclavian vessels.  Heparin was  administered and the ACT was documented as being therapeutic.  The  sternal retractor was placed, and  the pericardium was opened and  suspended.  Pursestrings are placed in the ascending aorta and right  atrium and the patient was cannulated and placed on bypass.  The  coronary arteries were identified for grafting.  The mammary artery and  vein grafts were prepared for the distal anastomoses.  Cardioplegia  catheters were placed for both antegrade aortic and retrograde coronary  sinus cardioplegia.  The patient was cooled to 32 degrees and the aortic  crossclamp was applied.   Then 800 mL of cold blood cardioplegia was delivered to the coronaries  in split doses between the antegrade aortic and retrograde coronary  sinus catheters.  There is good cardioplegic arrest.   The distal coronary anastomoses were then performed.  The first distal  anastomosis was to the posterior descending branch on the right.  This  was a  1.5-mm vessel with proximal 70-80% stenosis.  Reverse saphenous  vein was sewn end-to-side with running 7-0 Prolene.  There is good flow  through the graft.  The second and third distal anastomoses consisted of  a sequential vein graft to the OM 1 and OM 2.  The OM 1 was a 1.5 mm  vessel with proximal 70% stenosis.  A side-to-side anastomosis was  constructed using running 7-0 Prolene.  The third distal anastomosis was  a continuation of the sequential vein graft to the OM 2.  This was a  larger vessel with a high-grade 90% stenosis and the end of the vein was  sewn end-to-side with running 7-0 Prolene with good flow through the  graft.  Cardioplegia was redosed.  The fourth distal anastomosis was the  distal third of the LAD.  Here it was a 1.5-mm vessel and was totally  occluded proximally.  The left IMA pedicle was brought through an  opening created in the left lateral pericardium.  It was brought down  onto the LAD and sewn end-to-side with running 8-0 Prolene.  There is  excellent flow through the anastomosis after briefly releasing the  pedicle bulldog clamp on the mammary pedicle.  The bulldog was reapplied  and the pedicle was secured to the epicardium.  Cardioplegia was  redosed.   While the crossclamp was still in place, 2 proximal vein anastomoses  were placed on the ascending aorta using a 4.0-mm punch running 7-0  Prolene.  Prior to tying down the final proximal anastomosis, air was  vented from the coronaries and the left side of the heart using a dose  of retrograde warm blood cardioplegia (hot shot).  The proximal  anastomosis was tied, and the crossclamp was removed.   The heart resumed a spontaneous rhythm.  The cardioplegia catheters were  removed.  Air was aspirated from the vein grafts with a 27 gauge needle.  The grafts were open; each had good flow and hemostasis was documented  at the proximal and distal anastomoses.  The patient was rewarmed to 37 degrees and  temporary pacing wires were applied.  When the patient had  been adequately reperfused, the lungs were re-expanded and the  ventilator was resumed.  The patient was weaned from bypass without  difficulty.  Cardiac output and blood pressure were stable.  Protamine  was administered without adverse reaction.  The cannulas were removed.  The mediastinum was irrigated with warm antibiotic irrigation.  The leg  incision was irrigated and closed in the standard fashion.  The superior  pericardial fat was closed over the aorta.  Two mediastinal and a left  pleural chest tube were placed and brought out through separate  incisions.  The sternum was closed with interrupted steel wire.  The  pectoralis fascia was closed in running #1 Vicryl.  The subcutaneous and  skin layers were closed in running Vicryl and sterile dressings were  applied.  Total bypass time was 120 minutes; crossclamp time was 74  minutes.      Kerin Perna, M.D.  Electronically Signed     PV/MEDQ  D:  05/30/2006  T:  06/01/2006  Job:  [id number]   cc:   Thereasa Solo. Little, M.D.

## 2010-08-04 NOTE — Cardiovascular Report (Signed)
NAMEPRYOR, GUETTLER                ACCOUNT NO.:  [id number]   MEDICAL RECORD NO.:  [id number]          PATIENT TYPE:  INP   LOCATION:  2807                         FACILITY:  [facility]   PHYSICIAN:  Thereasa Solo. Little, M.D. DATE OF BIRTH:  1940-12-02   DATE OF PROCEDURE:  05/30/2006  DATE OF DISCHARGE:                            CARDIAC CATHETERIZATION   INDICATIONS FOR TEST:  This 70 year old male has a history of smoking,  family history of coronary disease, and has hyperlipidemia and  hypertension.  He had a cardiac catheterization performed in 2003 that  showed total occlusion of the LAD with left-to-left collaterals and mild  disease in his circumflex and right system.  A standard Cardiolite study  was performed, not for symptoms, on May 28, 2006.  This showed  inferior-anterior ischemia and well preserved LV function.  The patient  is not having angina but was brought in for outpatient cardiac  catheterization because of a high-risk nuclear study.   After obtaining informed consent, the patient was prepped and draped in  the usual sterile fashion exposing the right groin.  Local anesthetic  with 1% Xylocaine, the Seldinger technique employed, and a 5-French  introducer sheath was placed in the right femoral artery.  Left and  right coronary arteriography, ventriculography in the RAO projection and  distal aortogram was performed.   COMPLICATIONS:  None.   TOTAL CONTRAST:  90 mL.   EQUIPMENT:  5-French Judkins configuration catheters.   RESULTS:  1. Hemodynamic monitoring:  Central aortic pressure was 159/83.  Left      ventricular pressure was 161/10, and there was no aortic valve      gradient at the time of pullback.  2. Ventriculography.  Ventriculography in the RAO projection using 20      mL of contrasted at 12 mL per second showed normal LV systolic      function with no wall motion abnormalities.  Ejection fraction was      55%; no mitral regurgitation was seen.   Left ventricular end-      diastolic pressure was 23.  3. Distal aortogram.  The distal aortogram done above the level of the      renal arteries using 25 mL of contrast at 15 mL per second showed      no evidence of renal artery stenosis.  No abdominal aortic      aneurysm.  No proximal iliac disease.  4. Coronary arteriography:  On fluoroscopy, calcification seen in      distribution of the left main and proximal LAD.   1. Left main that bifurcated and was free of disease.  2. LAD.  The LAD was 100% occluded proximally.  There was faint      visualization of the LAD from left-to-left collaterals and right-to-      left collaterals.  3. Circumflex.  The circumflex was a moderate-sized vessel.  Just      proximal to the bifurcation of 2 OMs, there was an area of 60%      narrowing that extended down into the ostium OM #2, and this  ostium      was subtotaled; OM #2 was a large vessel.  OM #1 had moderate      disease in the proximal portion of 20-30%.  4. Right coronary artery.  The right coronary was a dominant vessel.      In the midportion was a concentric area of 70% narrowing, and there      were mild irregularities in the distal portion of the vessel.  The      PDA and posterolateral vessel were free of disease.  There was      faint collaterals from the right appeared to the distal LAD.   CONCLUSION:  Total occlusion of the left anterior descending with  collaterals, subtotaled ostium of obtuse marginal #2 with moderate  disease in the circumflex and moderate disease in the right coronary  artery.  1. Well-preserved left ventricular systolic function.   Because of his anatomy, he needs bypass surgery.  I have discussed this  with Dr. Donata Clay who will see the patient and proceed on with surgery,  either later today or the first of the week depending on what the  patient's preference is.           ______________________________  Thereasa Solo. Little, M.D.     ABL/MEDQ  D:   05/30/2006  T:  05/31/2006  Job:  [id number]   cc:   Janae Bridgeman. Eloise Harman., M.D.  Dr. Selena Batten  Cath Lab

## 2010-08-04 NOTE — Discharge Summary (Signed)
NAMEIRVAN, TIEDT NO.:  [id number]   MEDICAL RECORD NO.:  [id number]          PATIENT TYPE:  INP   LOCATION:  2007                         FACILITY:  [facility]   PHYSICIAN:  Kerin Perna, M.D.  DATE OF BIRTH:  [birth year]   DATE OF ADMISSION:  05/30/2006  DATE OF DISCHARGE:                               DISCHARGE SUMMARY   HISTORY OF PRESENT ILLNESS:  The patient is a 70 year old male who was  admitted this hospitalization due to increasing symptoms of chest pain  for cardiac catheterization.  The patient has a known abnormal stress  test in 2003 and a catheterization done at that time showed a  chronically occluded LAD, but with good collateralization and good LV  function.  He had been treated medically up until recently when he  developed exertional chest pain going up hills or stairs or with  increased effort.  A stress test was done recently and this revealed  ischemic changes and he was admitted this hospitalization for elective  cardiac catheterization by Dr. Clarene Duke.   PAST MEDICAL HISTORY:  1. Hypertension.  2. Hyperlipidemia.  3. Active smoker.  4. No known allergies.  5. Status post nasal septal repair.  6. 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   MEDICATIONS PRIOR TO ADMISSION:  1. Cozaar 50 mg daily.  2. Aspirin 81 mg daily.  3. Toprol XL 25 mg daily.  4. Vytorin 10/20 one daily.  5. Vitamin C one tablet daily.   ALLERGIES:  No known drug allergies.   SOCIAL HISTORY/FAMILY HISTORY/REVIEW OF SYMPTOMS/PHYSICAL EXAMINATION:  Please see the history and physical done at the time of admission.   HOSPITAL COURSE:  The patient was admitted as stated for cardiac  catheterization.  This demonstrated an occluded LAD, but with decreased  collateralization and a high-grade stenosis of the circumflex, obtuse  marginal 2, 90% and a 70% stenosis of the obtuse marginal 1.  The distal  right coronary had a 70% stenosis and overall  left ventricular function  was still well preserved.  Left ventricular end-diastolic pressure was  22 and there was no evidence of aortic stenosis or mitral regurgitation.  Based on his coronary anatomy and his symptoms, he was felt to be a  candidate for surgical revascularization and he was referred to Dr.  Kathlee Nations Trigt for cardiothoracic surgical consultation.  His studies  were reviewed by Dr. Donata Clay and after evaluating the patient he  agreed that surgical revascularization was his best option at this time  and, in fact, due to the severity of the findings, it was Dr. Zenaida Niece  Trigt's opinion that he should proceed emergently.   PROCEDURE:  On May 30, 2006, he was taken to the operating room on an  emergent basis and underwent the following procedure:  Coronary artery  bypass grafting x4 with the following grafts replaced:   1. Left internal mammary artery to the LAD.  2. Sequential saphenous graft to the obtuse marginal 1 and obtuse      marginal 2 in a sequential fashion.  3. Saphenous vein graft  to the posterior descending.   Patient tolerated the procedure well and was taken to the surgical  intensive care unit in stable condition.   POSTOPERATIVE HOSPITAL COURSE:  The patient has done quite well.  He has  maintained stable hemodynamics.  All routines lines, monitors and  drainage devices were discontinued in the standard fashion.  He  initially did require some low-dose pressor support, but this was weaned  without difficulty.  He has responded well to a gentle diuresis.  He was  weaned from the ventilator without difficulty.  He has tolerated a  gradual increase in activity commensurate to the level of postoperative  convalescence standard cardiac rehabilitation post cardiac surgical  modalities.  The patient has had a postoperative atrial fibrillation.  This has been subsequently chemically cardioverted with initially  intravenous amiodarone and subsequent conversion  to oral.  He is  maintaining a normal sinus rhythm at this time.  His incisions are  healing well without evidence of infection.  He is afebrile.  Oxygen has  been weaned and he maintains good saturations on room air.  His  laboratory values do reveal a moderate postoperative anemia, but this is  stable.  Most recent hemoglobin and hematocrit dated June 03, 2006 are  8.8 and 25.6, respectively.  Currently, the patient's status is felt to  be stable for tentative discharge in the morning of June 04, 2006  pending morning round reevaluation.   INSTRUCTIONS:  The patient received written instructions in regard to  medications, activity, diet, wound care and followup.   FOLLOWUP:  Followup includes:  1. Dr. Donata Clay in three weeks.  The office will arrange this.  2. Dr. Clarene Duke in two weeks.  He is instructed to call for an      appointment.   MEDICATIONS ON DISCHARGE:  Are as follows:  1. Aspirin 325 mg daily.  2. Lopressor 25 mg twice daily.  3. Currently, ACE inhibitor is on hold due to low relative blood      pressure, systolic in the low 100s.  4. Vytorin 10/20 mg daily.  5. Amiodarone 400 mg twice daily for an additional 13 days then 400 mg      daily.  6. For pain, oxycodone 5 mg one or two every four to six hours as      needed.   FINAL DIAGNOSIS:  Severe multi-vessel coronary artery disease as  described.   OTHER DIAGNOSES:  Include:  1. Postoperative atrial fibrillation.  2. Postoperative anemia.  3. Hypertension.  4. Hyperlipidemia.  5. Tobacco abuse.  6. Previous nasal septal repair.      Rowe Clack, P.A.-C.      Kerin Perna, M.D.  Electronically Signed    WEG/MEDQ  D:  06/03/2006  T:  06/03/2006  Job:  [id number]   cc:   Thereasa Solo. Little, M.D.  Janae Bridgeman. Eloise Harman., M.D.

## 2011-03-08 ENCOUNTER — Other Ambulatory Visit (HOSPITAL_COMMUNITY): Payer: Self-pay | Admitting: Internal Medicine

## 2011-03-08 ENCOUNTER — Other Ambulatory Visit (HOSPITAL_COMMUNITY): Payer: Self-pay | Admitting: *Deleted

## 2011-03-08 DIAGNOSIS — R221 Localized swelling, mass and lump, neck: Secondary | ICD-10-CM

## 2011-03-08 DIAGNOSIS — T17308A Unspecified foreign body in larynx causing other injury, initial encounter: Secondary | ICD-10-CM

## 2011-03-16 ENCOUNTER — Ambulatory Visit (HOSPITAL_COMMUNITY)
Admission: RE | Admit: 2011-03-16 | Discharge: 2011-03-16 | Disposition: A | Discharge status: Home / Self Care | Payer: Medicare Other | Source: Ambulatory Visit | Attending: Internal Medicine | Admitting: Internal Medicine

## 2011-03-16 DIAGNOSIS — R6889 Other general symptoms and signs: Secondary | ICD-10-CM | POA: Insufficient documentation

## 2011-03-16 DIAGNOSIS — R221 Localized swelling, mass and lump, neck: Secondary | ICD-10-CM

## 2011-03-16 DIAGNOSIS — T17308A Unspecified foreign body in larynx causing other injury, initial encounter: Secondary | ICD-10-CM

## 2011-03-16 DIAGNOSIS — R220 Localized swelling, mass and lump, head: Secondary | ICD-10-CM | POA: Insufficient documentation

## 2011-03-16 NOTE — Procedures (Signed)
Modified Barium Swallow Procedure Note Patient Details  Name: DEMON VOLANTE MRN: [id number] Date of Birth: 1940-10-02  Today's Date: 03/16/2011   Past Medical History: No past medical history on file. Past Surgical History: No past surgical history on file.  Recommendation/Prognosis  Clinical Impression Dysphagia Diagnosis: Within Functional Limits Clinical impression: Pt oropharyx viewed in both lateral and A-P positions.  No oral or pharyngeal dysphagia was identified, and no difficulty with thin, puree, solid consistency or tablet.  Given pt report of swelling to the right neck, radiologist confirmed no observable abnormality.  Recommend follow up with ENT to evaluate etiology of pt symptoms. Recommendations Recommended Consults: Consider ENT evaluation Solid Consistency: Regular Liquid Consistency: Thin Liquid Administration via: Cup;Straw Medication Administration: Whole meds with liquid Supervision: Patient able to self feed Oral Care Recommendations: Patient independent with oral care Follow up Recommendations: None Prognosis Prognosis for Safe Diet Advancement: Good Individuals Consulted Consulted and Agree with Results and Recommendations: Patient Report Sent to : Referring physician  General:  Date of Onset: 03/16/11 HPI: Pt indicated an episode a few weeks ago where he experienced swelling in his right neck after eating.  The sensation is variable.  The pt reports no overt s/s aspiration. Type of Study: Initial MBS Diet Prior to this Study: Regular;Thin liquids Temperature Spikes Noted: No Respiratory Status: Room air History of Intubation: No Behavior/Cognition: Alert;Cooperative;Pleasant mood Oral Cavity - Dentition: Adequate natural dentition Oral Motor / Sensory Function: Within functional limits Vision: Functional for self-feeding Patient Positioning: Upright in chair Baseline Vocal Quality: Normal Volitional Cough: Strong Volitional Swallow: Able to  elicit Anatomy: Within functional limits Pharyngeal Secretions: Not observed secondary MBS Ice chips: Not tested Oral Phase Oral Preparation/Oral Phase Oral Phase: WFL Pharyngeal Phase  Pharyngeal Phase Pharyngeal Phase: Within functional limits Cervical Esophageal Phase  Cervical Esophageal Phase Cervical Esophageal Phase: [facility] Maebelle Sulton B. Rucha Wissinger, MSP, CCC-SLP [phone]

## 2012-08-20 ENCOUNTER — Encounter: Payer: Self-pay | Admitting: Internal Medicine

## 2013-01-28 ENCOUNTER — Other Ambulatory Visit: Payer: Self-pay | Admitting: Internal Medicine

## 2013-01-28 DIAGNOSIS — R42 Dizziness and giddiness: Secondary | ICD-10-CM

## 2013-01-29 ENCOUNTER — Ambulatory Visit
Admission: RE | Admit: 2013-01-29 | Discharge: 2013-01-29 | Disposition: A | Discharge status: Home / Self Care | Payer: Medicare Other | Source: Ambulatory Visit | Attending: Internal Medicine | Admitting: Internal Medicine

## 2013-01-29 ENCOUNTER — Ambulatory Visit: Payer: Medicare Other | Admitting: Internal Medicine

## 2013-01-29 DIAGNOSIS — R42 Dizziness and giddiness: Secondary | ICD-10-CM

## 2013-01-30 ENCOUNTER — Encounter: Payer: Self-pay | Admitting: *Deleted

## 2013-02-05 ENCOUNTER — Ambulatory Visit (INDEPENDENT_AMBULATORY_CARE_PROVIDER_SITE_OTHER): Payer: Medicare Other | Admitting: Internal Medicine

## 2013-02-05 ENCOUNTER — Encounter: Payer: Self-pay | Admitting: Internal Medicine

## 2013-02-05 VITALS — BP 120/76 | HR 66 | Ht 66.5 in | Wt 153.9 lb

## 2013-02-05 DIAGNOSIS — E785 Hyperlipidemia, unspecified: Secondary | ICD-10-CM | POA: Insufficient documentation

## 2013-02-05 DIAGNOSIS — I2510 Atherosclerotic heart disease of native coronary artery without angina pectoris: Secondary | ICD-10-CM

## 2013-02-05 DIAGNOSIS — Z951 Presence of aortocoronary bypass graft: Secondary | ICD-10-CM

## 2013-02-05 DIAGNOSIS — D62 Acute posthemorrhagic anemia: Secondary | ICD-10-CM

## 2013-02-05 DIAGNOSIS — K922 Gastrointestinal hemorrhage, unspecified: Secondary | ICD-10-CM | POA: Insufficient documentation

## 2013-02-05 DIAGNOSIS — E119 Type 2 diabetes mellitus without complications: Secondary | ICD-10-CM

## 2013-02-05 HISTORY — DX: Presence of aortocoronary bypass graft: Z95.1

## 2013-02-05 HISTORY — DX: Type 2 diabetes mellitus without complications: E11.9

## 2013-02-05 NOTE — Progress Notes (Signed)
OFFICE NOTE  Chief Complaint:  Recent hypotension, bradycardia, dark stools  Primary Care Physician: Pearson Grippe, MD  HPI:  [name] is a pleasant 72 yo male with a history of CAD and emergent CABG in 2008 x 4 vessels (LIMA to LAD, sequential SVG to OM1 and OM 2, SVG to PDA) , complicated by transient postop atrial fibrillation. He has had no recurrence of any arrhythmias.  He has been maintained on 325 mg aspirin.  Any recurrence of chest pain. Recently was seen in Dr. Elmyra Ricks office after an episode of hypotension and bradycardia. He was noted to be pale and somewhat lethargic. His wife had noted that he had dark stools and there was concern for possible GI bleeding. He did have laboratory work which indicated an acute drop in hemoglobin and hematocrit. His H&H was 14 and 41 on 07/28/2012 and decreased to 10 and 31 on 01/29/2013.  Because he was having some confusion and dizziness apparently he underwent a CT scan of the head which was negative and a chest x-ray which was unremarkable. Stool Y. x-ray never performed. He was however referred to a GI doctor and has an upcoming appointment to his blood pressure medicine was decreased due to hypotension and is normotensive today. He still reports some fatigue but his symptoms have improved he denies any ongoing melena or hematochezia.  PMHx:  Past Medical History  Diagnosis Date  . CAD (coronary artery disease)     cath & CABG in 2008  . S/P CABG (coronary artery bypass graft) 2008    post-op AF with no recurrence   . Diabetes     type 2  . Dyslipidemia   . History of nuclear stress test 10/08/2007    bruce myoview; normal scan with attenuation artifact; no ischemia/infarct; low risk     Past Surgical History  Procedure Laterality Date  . Nasal septoplasty w/ turbinoplasty  1970s  . Cardiac catheterization  2003    total occlusion of LAD with left to left collaterals, mod disease of RCA & mild disease in Cfx (Dr. Langston Reusing)  .  Coronary artery bypass graft  05/31/2006    LIMA to LAD, SVG to OM1 & OM2, SVG to PDA (Dr. Donata Clay)  . Carotid doppler  06/2010    stenosis of ICA less than 50%     FAMHx:  Family History  Problem Relation Age of Onset  . Diabetes Mother     died of CVA at 74, also HTN  . Alzheimer's disease Father   . CAD Father   . Heart attack Father 56    also HTN  . Stroke Maternal Grandmother     SOCHx:   reports that he quit smoking about 6 years ago. He has never used smokeless tobacco. He reports that he does not drink alcohol or use illicit drugs.  ALLERGIES:  No Known Allergies  ROS: A comprehensive review of systems was negative except for: Constitutional: positive for fatigue Gastrointestinal: positive for melena  HOME MEDS: Current Outpatient Prescriptions  Medication Sig Dispense Refill  . albuterol (PROVENTIL HFA;VENTOLIN HFA) 108 (90 BASE) MCG/ACT inhaler Inhale 2 puffs into the lungs every 6 (six) hours as needed for wheezing or shortness of breath.      . Cyanocobalamin (VITAMIN B-12) 1000 MCG SUBL Place 2,000 mcg under the tongue daily.      . folic acid (FOLVITE) 1 MG tablet Take 1 mg by mouth daily.      [name]  lisinopril (PRINIVIL,ZESTRIL) 5 MG tablet Take 2.5 mg by mouth daily.       . metFORMIN (GLUCOPHAGE) 500 MG tablet Take 500 mg by mouth daily with breakfast.      . Omega-3 Fatty Acids (FISH OIL) 300 MG CAPS Take 1 capsule by mouth daily.       [name] omeprazole (PRILOSEC OTC) 20 MG tablet Take 20 mg by mouth daily.      . pioglitazone (ACTOS) 15 MG tablet Take 15 mg by mouth daily.      . pravastatin (PRAVACHOL) 40 MG tablet Take 40 mg by mouth daily.       No current facility-administered medications for this visit.    LABS/IMAGING: No results found for this or any previous visit (from the past 48 hour(s)). No results found.  VITALS: BP 120/76  Pulse 66  Ht 5' 6.5" (1.689 m)  Wt 153 lb 14.4 oz (69.809 kg)  BMI 24.47 kg/m2  EXAM: General appearance: alert  and no distress Neck: no carotid bruit and no JVD Lungs: clear to auscultation bilaterally Heart: regular rate and rhythm, S1, S2 normal, no murmur, click, rub or gallop Abdomen: soft, non-tender; bowel sounds normal; no masses,  no organomegaly Extremities: extremities normal, atraumatic, no cyanosis or edema Pulses: 2+ and symmetric Skin: Skin color pale, texture, turgor normal. No rashes or lesions Neurologic: Grossly normal Psych: Mood, affect normal  EKG: Normal sinus rhythm at 60  ASSESSMENT: 1. Coronary artery disease status post four-vessel CABG in  2. Postoperative atrial fibrillation without recurrence 3. Diabetes type 2 4. Dyslipidemia 5. Recent probable GI bleed 6. Acute blood loss anemia  PLAN: 1.   [name] most likely had a recent GI bleed. It seems to resolve spontaneously, but I agree with GI referral for endoscopy and/or colonoscopy. Given the recent bleeding, would recommend holding his aspirin for now until he can be better evaluated. He was recently started on omeprazole he should continue to take this for acid reduction. If his workup remains negative, then I would recommend restarting aspirin 81 mg daily. There is no benefit to higher dose 162 mg aspirin versus 81 mg aspirin other than increased bleeding risk. Plan to see him back annually or sooner as necessary.  Chrystie Nose, MD, [facility] Attending Cardiologist [facility]  Thelma Lorenzetti C 02/05/2013, 3:29 PM

## 2013-02-05 NOTE — Patient Instructions (Addendum)
STOP aspirin.  Your physician wants you to follow-up in: 1 year. You will receive a reminder letter in the mail two months in advance. If you don't receive a letter, please call our office to schedule the follow-up appointment.

## 2013-02-10 ENCOUNTER — Other Ambulatory Visit: Payer: Self-pay | Admitting: *Deleted

## 2013-02-10 MED ORDER — PRAVASTATIN SODIUM 40 MG PO TABS: 40.0000 mg | ORAL_TABLET | Freq: Every day | ORAL | Status: DC

## 2013-02-10 NOTE — Telephone Encounter (Signed)
Rx was sent to pharmacy electronically. 

## 2013-03-05 ENCOUNTER — Telehealth: Payer: Self-pay | Admitting: Internal Medicine

## 2013-03-05 NOTE — Telephone Encounter (Signed)
Returned call.  Left message to call back before 4pm or tomorrow before 4pm.    Per Dr. Blanchie Dessert note on 11.20.14, if pt's workup is negative, then he would recommend pt restart ASA at 81 mg.  Will confirm w/ Dr. [name].

## 2013-03-05 NOTE — Telephone Encounter (Signed)
Had GI bleed,the doctor wants him to take 81 mg of aspirin starting in January. Is this all right?If not at home please leave a message.

## 2013-03-05 NOTE — Telephone Encounter (Signed)
Message forwarded to Dr. Hilty.  

## 2013-03-06 NOTE — Telephone Encounter (Signed)
Pt called back and verified x 2.  Informed per MD.  Pt verbalized understanding and agreed w/ plan.

## 2013-03-06 NOTE — Telephone Encounter (Signed)
Yes .. Agree with starting 81 mg aspirin daily.  Dr. [name]

## 2013-03-06 NOTE — Telephone Encounter (Signed)
Returning your call from yesterday. °

## 2013-03-06 NOTE — Telephone Encounter (Signed)
Returned call.  Left message to call back before 4pm.  Will inform about ASA when call returned.

## 2013-04-28 ENCOUNTER — Other Ambulatory Visit: Payer: Self-pay | Admitting: *Deleted

## 2013-04-28 ENCOUNTER — Encounter: Payer: Medicare Other | Admitting: Neurology

## 2013-04-28 MED ORDER — LISINOPRIL 5 MG PO TABS: 2.5000 mg | ORAL_TABLET | Freq: Every day | ORAL | Status: DC

## 2013-04-29 ENCOUNTER — Encounter (INDEPENDENT_AMBULATORY_CARE_PROVIDER_SITE_OTHER): Payer: Self-pay

## 2013-04-29 ENCOUNTER — Ambulatory Visit (INDEPENDENT_AMBULATORY_CARE_PROVIDER_SITE_OTHER): Payer: Self-pay

## 2013-04-29 ENCOUNTER — Ambulatory Visit (INDEPENDENT_AMBULATORY_CARE_PROVIDER_SITE_OTHER): Payer: Medicare Other | Admitting: Diagnostic Neuroimaging

## 2013-04-29 DIAGNOSIS — R200 Anesthesia of skin: Secondary | ICD-10-CM

## 2013-04-29 DIAGNOSIS — R209 Unspecified disturbances of skin sensation: Secondary | ICD-10-CM

## 2013-04-29 DIAGNOSIS — Z0289 Encounter for other administrative examinations: Secondary | ICD-10-CM

## 2013-04-29 NOTE — Procedures (Signed)
   [facility]  NCS (NERVE CONDUCTION STUDY) WITH EMG (ELECTROMYOGRAPHY) REPORT   STUDY DATE: 04/29/13 PATIENT NAME: [name] DOB: 1941-02-11 MRN: [id number]  ORDERING CLINICIAN: Jani Gravel, MD  TECHNOLOGIST: Laretta Alstrom ELECTROMYOGRAPHER: Earlean Polka. Penumalli, MD  CLINICAL INFORMATION: 73 year old male with left thigh numbness and pain.  FINDINGS: NERVE CONDUCTION STUDY: Bilateral peroneal and bilateral tibial motor responses have normal distal latencies, amplitude, conduction velocities and F-wave latencies. Bilateral H reflex responses are normal. Bilateral peroneal, saphenous and lateral femoral cutaneous sensory responses are normal and symmetric.  NEEDLE ELECTROMYOGRAPHY: Needle examination of selected muscles of the left lower extremity (tibialis anterior, gastrocnemius, vastus medialis, tensor fascia lata, biceps femoris short head) shows no abnormal spontaneous activity at rest and normal motor unit recruitment on exertion. Left L4-5 and L5-S1 paraspinal muscles are normal.  IMPRESSION:  Normal study. No electrodiagnostic evidence of large fiber neuropathy or lumbar radiculopathy at this time.   INTERPRETING PHYSICIAN:  Penni Bombard, MD Certified in Neurology, Neurophysiology and Neuroimaging  [facility] [address] [place], [place] [address] [phone]

## 2014-02-02 ENCOUNTER — Ambulatory Visit (INDEPENDENT_AMBULATORY_CARE_PROVIDER_SITE_OTHER): Payer: Medicare Other | Admitting: Internal Medicine

## 2014-02-02 ENCOUNTER — Encounter: Payer: Self-pay | Admitting: Internal Medicine

## 2014-02-02 VITALS — BP 132/82 | HR 75 | Ht 65.0 in | Wt 159.7 lb

## 2014-02-02 DIAGNOSIS — I2583 Coronary atherosclerosis due to lipid rich plaque: Secondary | ICD-10-CM

## 2014-02-02 DIAGNOSIS — D62 Acute posthemorrhagic anemia: Secondary | ICD-10-CM

## 2014-02-02 DIAGNOSIS — E785 Hyperlipidemia, unspecified: Secondary | ICD-10-CM

## 2014-02-02 DIAGNOSIS — Z951 Presence of aortocoronary bypass graft: Secondary | ICD-10-CM

## 2014-02-02 DIAGNOSIS — I2510 Atherosclerotic heart disease of native coronary artery without angina pectoris: Secondary | ICD-10-CM

## 2014-02-02 NOTE — Patient Instructions (Signed)
Your physician wants you to follow-up in: 1 year with Dr. Hilty. You will receive a reminder letter in the mail two months in advance. If you don't receive a letter, please call our office to schedule the follow-up appointment.  

## 2014-02-02 NOTE — Progress Notes (Signed)
OFFICE NOTE  Chief Complaint:  No complaints  Primary Care Physician: Jani Gravel, MD  HPI:  [name] is a pleasant 73 yo male with a history of CAD and emergent CABG in 2008 x 4 vessels (LIMA to LAD, sequential SVG to OM1 and OM 2, SVG to PDA) , complicated by transient postop atrial fibrillation. He has had no recurrence of any arrhythmias.  He has been maintained on 325 mg aspirin.  Any recurrence of chest pain. Recently was seen in Dr. Julianne Rice office after an episode of hypotension and bradycardia. He was noted to be pale and somewhat lethargic. His wife had noted that he had dark stools and there was concern for possible GI bleeding. He did have laboratory work which indicated an acute drop in hemoglobin and hematocrit. His H&H was 14 and 41 on 07/28/2012 and decreased to 10 and 31 on 01/29/2013.  Because he was having some confusion and dizziness apparently he underwent a CT scan of the head which was negative and a chest x-ray which was unremarkable. Stool Y. x-ray never performed. He was however referred to a GI doctor and has an upcoming appointment to his blood pressure medicine was decreased due to hypotension and is normotensive today. He still reports some fatigue but his symptoms have improved he denies any ongoing melena or hematochezia.  [name] returns today for follow-up. It turns out that he did have some gastric erosions which appears to have healed on a PPI. I reduced his aspirin 81 mg and he's had no further bleeding episodes. He denies any chest pain or shortness of breath.  PMHx:  Past Medical History  Diagnosis Date  . CAD (coronary artery disease)     cath & CABG in 2008  . S/P CABG (coronary artery bypass graft) 2008    post-op AF with no recurrence   . Diabetes     type 2  . Dyslipidemia   . History of nuclear stress test 10/08/2007    bruce myoview; normal scan with attenuation artifact; no ischemia/infarct; low risk     Past Surgical History    Procedure Laterality Date  . Nasal septoplasty w/ turbinoplasty  1970s  . Cardiac catheterization  2003    total occlusion of LAD with left to left collaterals, mod disease of RCA & mild disease in Cfx (Dr. Rockne Menghini)  . Coronary artery bypass graft  05/31/2006    LIMA to LAD, SVG to OM1 & OM2, SVG to PDA (Dr. Prescott Gum)  . Carotid doppler  06/2010    stenosis of ICA less than 50%     FAMHx:  Family History  Problem Relation Age of Onset  . Diabetes Mother     died of CVA at 42, also HTN  . Alzheimer's disease Father   . CAD Father   . Heart attack Father 16    also HTN  . Stroke Maternal Grandmother     SOCHx:   reports that he quit smoking about 7 years ago. He has never used smokeless tobacco. He reports that he does not drink alcohol or use illicit drugs.  ALLERGIES:  No Known Allergies  ROS: A comprehensive review of systems was negative.  HOME MEDS: Current Outpatient Prescriptions  Medication Sig Dispense Refill  . albuterol (PROVENTIL HFA;VENTOLIN HFA) 108 (90 BASE) MCG/ACT inhaler Inhale 2 puffs into the lungs every 6 (six) hours as needed for wheezing or shortness of breath.    [name] aspirin (ASPIRIN EC) 81  MG EC tablet Take 81 mg by mouth daily. Swallow whole.    . Cyanocobalamin (VITAMIN B-12) 1000 MCG SUBL Place 2,000 mcg under the tongue daily.    . folic acid (FOLVITE) 1 MG tablet Take 1 mg by mouth daily.    [name] lisinopril (PRINIVIL,ZESTRIL) 5 MG tablet Take 0.5 tablets (2.5 mg total) by mouth daily. 90 tablet 2  . metFORMIN (GLUCOPHAGE) 500 MG tablet Take 500 mg by mouth daily with breakfast.    . Omega-3 Fatty Acids (FISH OIL) 300 MG CAPS Take 1 capsule by mouth daily.     [name] omeprazole (PRILOSEC OTC) 20 MG tablet Take 20 mg by mouth daily.    . pioglitazone (ACTOS) 30 MG tablet   3  . pravastatin (PRAVACHOL) 40 MG tablet Take 1 tablet (40 mg total) by mouth daily. 90 tablet 3   No current facility-administered medications for this visit.     LABS/IMAGING: No results found for this or any previous visit (from the past 48 hour(s)). No results found.  VITALS: BP 132/82 mmHg  Pulse 75  Ht 5\' 5"  (1.651 m)  Wt 159 lb 11.2 oz (72.439 kg)  BMI 26.58 kg/m2  EXAM: General appearance: alert and no distress Neck: no carotid bruit and no JVD Lungs: clear to auscultation bilaterally Heart: regular rate and rhythm, S1, S2 normal, no murmur, click, rub or gallop Abdomen: soft, non-tender; bowel sounds normal; no masses,  no organomegaly Extremities: extremities normal, atraumatic, no cyanosis or edema Pulses: 2+ and symmetric Skin: Skin color pale, texture, turgor normal. No rashes or lesions Neurologic: Grossly normal Psych: Mood, affect normal  EKG: Sinus rhythm with a PVs at 75  ASSESSMENT: 1. Coronary artery disease status post four-vessel CABG in 2008 2. Postoperative atrial fibrillation without recurrence 3. Diabetes type 2 4. Dyslipidemia 5. Recent GI bleed 6. Acute blood loss anemia - resolved  PLAN: 1.   [name] had GI bleeding due to erosive gastritis. His symptoms have resolved on PPI. We decreased his aspirin. He denies any chest pain or worsening shortness of breath. He's had no recurrence of A. Fib. His cholesterol is followed by his primary care provider and his anemia secondary to GI bleeding has resolved. No changes in his medications today and we'll see him back annually or sooner as necessary.  Pixie Casino, MD, [facility] Attending Cardiologist [facility]  HILTY,Kenneth C 02/02/2014, 2:17 PM

## 2014-02-03 ENCOUNTER — Encounter: Payer: Self-pay | Admitting: Neurology

## 2014-02-09 ENCOUNTER — Encounter: Payer: Self-pay | Admitting: Neurology

## 2014-02-17 ENCOUNTER — Other Ambulatory Visit: Payer: Self-pay

## 2014-02-17 MED ORDER — PRAVASTATIN SODIUM 40 MG PO TABS: 40.0000 mg | ORAL_TABLET | Freq: Every day | ORAL | Status: DC

## 2014-02-17 NOTE — Telephone Encounter (Signed)
Rx sent to pharmacy   

## 2014-04-13 ENCOUNTER — Other Ambulatory Visit: Payer: Self-pay | Admitting: *Deleted

## 2014-04-13 MED ORDER — LISINOPRIL 5 MG PO TABS: 2.5000 mg | ORAL_TABLET | Freq: Every day | ORAL | Status: DC

## 2014-04-19 ENCOUNTER — Other Ambulatory Visit: Payer: Self-pay | Admitting: Internal Medicine

## 2014-04-19 MED ORDER — LISINOPRIL 5 MG PO TABS: 2.5000 mg | ORAL_TABLET | Freq: Every day | ORAL | Status: DC

## 2014-04-19 NOTE — Telephone Encounter (Signed)
Pt need a new prescription ,please say it is an override over the mail order. Please call his Lisinopril 5 mg # 45 to [facility] [phone].

## 2014-04-19 NOTE — Telephone Encounter (Signed)
Rx refill sent to patient pharmacy   

## 2014-04-29 DIAGNOSIS — R7309 Other abnormal glucose: Secondary | ICD-10-CM | POA: Diagnosis not present

## 2014-04-29 DIAGNOSIS — I10 Essential (primary) hypertension: Secondary | ICD-10-CM | POA: Diagnosis not present

## 2014-04-29 DIAGNOSIS — Z125 Encounter for screening for malignant neoplasm of prostate: Secondary | ICD-10-CM | POA: Diagnosis not present

## 2014-05-06 ENCOUNTER — Encounter: Payer: Self-pay | Admitting: Internal Medicine

## 2014-05-06 DIAGNOSIS — Z0000 Encounter for general adult medical examination without abnormal findings: Secondary | ICD-10-CM | POA: Diagnosis not present

## 2014-05-06 DIAGNOSIS — R7309 Other abnormal glucose: Secondary | ICD-10-CM | POA: Diagnosis not present

## 2014-05-06 DIAGNOSIS — I10 Essential (primary) hypertension: Secondary | ICD-10-CM | POA: Diagnosis not present

## 2014-05-06 DIAGNOSIS — E780 Pure hypercholesterolemia: Secondary | ICD-10-CM | POA: Diagnosis not present

## 2014-08-11 DIAGNOSIS — R0981 Nasal congestion: Secondary | ICD-10-CM | POA: Diagnosis not present

## 2014-09-13 DIAGNOSIS — H02421 Myogenic ptosis of right eyelid: Secondary | ICD-10-CM | POA: Diagnosis not present

## 2014-09-13 DIAGNOSIS — H02422 Myogenic ptosis of left eyelid: Secondary | ICD-10-CM | POA: Diagnosis not present

## 2014-10-20 DIAGNOSIS — H2512 Age-related nuclear cataract, left eye: Secondary | ICD-10-CM | POA: Diagnosis not present

## 2014-11-05 DIAGNOSIS — R7309 Other abnormal glucose: Secondary | ICD-10-CM | POA: Diagnosis not present

## 2014-11-05 DIAGNOSIS — I10 Essential (primary) hypertension: Secondary | ICD-10-CM | POA: Diagnosis not present

## 2014-11-05 DIAGNOSIS — E538 Deficiency of other specified B group vitamins: Secondary | ICD-10-CM | POA: Diagnosis not present

## 2014-11-09 DIAGNOSIS — I10 Essential (primary) hypertension: Secondary | ICD-10-CM | POA: Diagnosis not present

## 2014-11-09 DIAGNOSIS — E780 Pure hypercholesterolemia: Secondary | ICD-10-CM | POA: Diagnosis not present

## 2014-11-09 DIAGNOSIS — R739 Hyperglycemia, unspecified: Secondary | ICD-10-CM | POA: Diagnosis not present

## 2014-11-09 DIAGNOSIS — I2510 Atherosclerotic heart disease of native coronary artery without angina pectoris: Secondary | ICD-10-CM | POA: Diagnosis not present

## 2014-11-10 DIAGNOSIS — H2512 Age-related nuclear cataract, left eye: Secondary | ICD-10-CM | POA: Diagnosis not present

## 2014-11-15 ENCOUNTER — Encounter: Payer: Self-pay | Admitting: Internal Medicine

## 2014-11-25 DIAGNOSIS — H2511 Age-related nuclear cataract, right eye: Secondary | ICD-10-CM | POA: Diagnosis not present

## 2015-01-03 DIAGNOSIS — H2511 Age-related nuclear cataract, right eye: Secondary | ICD-10-CM | POA: Diagnosis not present

## 2015-01-03 DIAGNOSIS — Z961 Presence of intraocular lens: Secondary | ICD-10-CM | POA: Diagnosis not present

## 2015-01-12 DIAGNOSIS — H2511 Age-related nuclear cataract, right eye: Secondary | ICD-10-CM | POA: Diagnosis not present

## 2015-02-03 ENCOUNTER — Ambulatory Visit (INDEPENDENT_AMBULATORY_CARE_PROVIDER_SITE_OTHER): Payer: Medicare Other | Admitting: Internal Medicine

## 2015-02-03 ENCOUNTER — Encounter: Payer: Self-pay | Admitting: Internal Medicine

## 2015-02-03 VITALS — BP 120/64 | HR 90 | Ht 66.0 in | Wt 156.0 lb

## 2015-02-03 DIAGNOSIS — Z951 Presence of aortocoronary bypass graft: Secondary | ICD-10-CM | POA: Diagnosis not present

## 2015-02-03 DIAGNOSIS — E785 Hyperlipidemia, unspecified: Secondary | ICD-10-CM | POA: Diagnosis not present

## 2015-02-03 DIAGNOSIS — I2510 Atherosclerotic heart disease of native coronary artery without angina pectoris: Secondary | ICD-10-CM

## 2015-02-03 DIAGNOSIS — E1100 Type 2 diabetes mellitus with hyperosmolarity without nonketotic hyperglycemic-hyperosmolar coma (NKHHC): Secondary | ICD-10-CM | POA: Diagnosis not present

## 2015-02-03 NOTE — Progress Notes (Signed)
OFFICE NOTE  Chief Complaint:  No complaints  Primary Care Physician: Jani Gravel, MD  HPI:  [name] is a pleasant 74 yo male with a history of CAD and emergent CABG in 2008 x 4 vessels (LIMA to LAD, sequential SVG to OM1 and OM 2, SVG to PDA) , complicated by transient postop atrial fibrillation. He has had no recurrence of any arrhythmias.  He has been maintained on 325 mg aspirin.  Any recurrence of chest pain. Recently was seen in Dr. Julianne Rice office after an episode of hypotension and bradycardia. He was noted to be pale and somewhat lethargic. His wife had noted that he had dark stools and there was concern for possible GI bleeding. He did have laboratory work which indicated an acute drop in hemoglobin and hematocrit. His H&H was 14 and 41 on 07/28/2012 and decreased to 10 and 31 on 01/29/2013.  Because he was having some confusion and dizziness apparently he underwent a CT scan of the head which was negative and a chest x-ray which was unremarkable. Stool Y. x-ray never performed. He was however referred to a GI doctor and has an upcoming appointment to his blood pressure medicine was decreased due to hypotension and is normotensive today. He still reports some fatigue but his symptoms have improved he denies any ongoing melena or hematochezia.  [name] returns today for follow-up. It turns out that he did have some gastric erosions which appears to have healed on a PPI. I reduced his aspirin 81 mg and he's had no further bleeding episodes. He denies any chest pain or shortness of breath.   I saw [name] back in the office today. Overall he seems to be doing well and is asymptomatic. He had recent laboratory work which again shows a stable cholesterol with a total around 150 and LDL around 80. He is tolerating medications without any difficulty. He remains physically active without any shortness of breath or chest pain.  It is been 8 years since his bypass surgery and he has not had  reassessment of he is bypass graft since that time.  PMHx:  Past Medical History  Diagnosis Date  . CAD (coronary artery disease)     cath & CABG in 2008  . S/P CABG (coronary artery bypass graft) 2008    post-op AF with no recurrence   . Diabetes ([place])     type 2  . Dyslipidemia   . History of nuclear stress test 10/08/2007    bruce myoview; normal scan with attenuation artifact; no ischemia/infarct; low risk     Past Surgical History  Procedure Laterality Date  . Nasal septoplasty w/ turbinoplasty  1970s  . Cardiac catheterization  2003    total occlusion of LAD with left to left collaterals, mod disease of RCA & mild disease in Cfx (Dr. Rockne Menghini)  . Coronary artery bypass graft  05/31/2006    LIMA to LAD, SVG to OM1 & OM2, SVG to PDA (Dr. Prescott Gum)  . Carotid doppler  06/2010    stenosis of ICA less than 50%     FAMHx:  Family History  Problem Relation Age of Onset  . Diabetes Mother     died of CVA at 65, also HTN  . Alzheimer's disease Father   . CAD Father   . Heart attack Father 78    also HTN  . Stroke Maternal Grandmother     SOCHx:   reports that he quit smoking about  8 years ago. He has never used smokeless tobacco. He reports that he does not drink alcohol or use illicit drugs.  ALLERGIES:  No Known Allergies  ROS: A comprehensive review of systems was negative.  HOME MEDS: Current Outpatient Prescriptions  Medication Sig Dispense Refill  . albuterol (PROVENTIL HFA;VENTOLIN HFA) 108 (90 BASE) MCG/ACT inhaler Inhale 2 puffs into the lungs every 6 (six) hours as needed for wheezing or shortness of breath.    [name] aspirin (ASPIRIN EC) 81 MG EC tablet Take 81 mg by mouth daily. Swallow whole.    . Cyanocobalamin (VITAMIN B-12) 1000 MCG SUBL Place 2,000 mcg under the tongue daily.    . folic acid (FOLVITE) 1 MG tablet Take 1 mg by mouth daily.    [name] lisinopril (PRINIVIL,ZESTRIL) 5 MG tablet Take 0.5 tablets (2.5 mg total) by mouth daily. 45 tablet 0  .  metFORMIN (GLUCOPHAGE) 500 MG tablet Take 500 mg by mouth daily with breakfast.    . Omega-3 Fatty Acids (FISH OIL) 300 MG CAPS Take 1 capsule by mouth daily.     [name] omeprazole (PRILOSEC OTC) 20 MG tablet Take 20 mg by mouth daily.    . pioglitazone (ACTOS) 30 MG tablet   3  . pravastatin (PRAVACHOL) 40 MG tablet Take 1 tablet (40 mg total) by mouth daily. 90 tablet 3   No current facility-administered medications for this visit.    LABS/IMAGING: No results found for this or any previous visit (from the past 48 hour(s)). No results found.  VITALS: BP 120/64 mmHg  Pulse 90  Ht 5\' 6"  (1.676 m)  Wt 156 lb (70.761 kg)  BMI 25.19 kg/m2  EXAM: General appearance: alert and no distress Neck: no carotid bruit and no JVD Lungs: clear to auscultation bilaterally Heart: regular rate and rhythm, S1, S2 normal, no murmur, click, rub or gallop Abdomen: soft, non-tender; bowel sounds normal; no masses,  no organomegaly Extremities: extremities normal, atraumatic, no cyanosis or edema Pulses: 2+ and symmetric Skin: Skin color pale, texture, turgor normal. No rashes or lesions Neurologic: Grossly normal Psych: Mood, affect normal  EKG: Sinus rhythm at 90, nonspecific ST changes  ASSESSMENT: 1. Coronary artery disease status post four-vessel CABG in 2008 2. Postoperative atrial fibrillation without recurrence 3. Diabetes type 2 4. Dyslipidemia 5.   History of GI bleeding  PLAN: 1.   [name]  Seems to be doing fairly well without any recurrent A. Fib. He had some GI bleeding last year but has not recurred. His diabetes is well controlled. Cholesterol is at goal. He is due for screening assessment of his coronary artery bypass graft which is indicated by guidelines in asymptomatic patients after 5 years.  I'm recommending an exercise nuclear stress test. I'll contact him with those results but otherwise plan to see him back in a year unless there abnormal.  Pixie Casino, MD,  [facility] Attending Cardiologist [place] 02/03/2015, 5:07 PM

## 2015-02-03 NOTE — Patient Instructions (Signed)
Your physician discussed the importance of regular exercise and recommended that you start or continue a regular exercise program for good health.  Your physician recommends that you schedule a follow-up appointment in 1 year.

## 2015-02-08 DIAGNOSIS — Z23 Encounter for immunization: Secondary | ICD-10-CM | POA: Diagnosis not present

## 2015-02-09 ENCOUNTER — Telehealth (HOSPITAL_COMMUNITY): Payer: Self-pay

## 2015-02-09 NOTE — Telephone Encounter (Signed)
Encounter complete. 

## 2015-02-15 ENCOUNTER — Ambulatory Visit (HOSPITAL_COMMUNITY)
Admission: RE | Admit: 2015-02-15 | Discharge: 2015-02-15 | Disposition: A | Discharge status: Home / Self Care | Payer: Medicare Other | Source: Ambulatory Visit | Attending: Cardiovascular Disease | Admitting: Cardiovascular Disease

## 2015-02-15 DIAGNOSIS — I10 Essential (primary) hypertension: Secondary | ICD-10-CM | POA: Diagnosis not present

## 2015-02-15 DIAGNOSIS — Z951 Presence of aortocoronary bypass graft: Secondary | ICD-10-CM | POA: Diagnosis not present

## 2015-02-15 DIAGNOSIS — R0609 Other forms of dyspnea: Secondary | ICD-10-CM | POA: Insufficient documentation

## 2015-02-15 DIAGNOSIS — R5383 Other fatigue: Secondary | ICD-10-CM | POA: Insufficient documentation

## 2015-02-15 DIAGNOSIS — Z8249 Family history of ischemic heart disease and other diseases of the circulatory system: Secondary | ICD-10-CM | POA: Diagnosis not present

## 2015-02-15 DIAGNOSIS — Z87891 Personal history of nicotine dependence: Secondary | ICD-10-CM | POA: Insufficient documentation

## 2015-02-15 LAB — MYOCARDIAL PERFUSION IMAGING
CHL CUP NUCLEAR SRS: 6
CHL CUP NUCLEAR SSS: 6
LV dias vol: 108 mL
LVSYSVOL: 59 mL
Peak HR: 88 {beats}/min
Rest HR: 60 {beats}/min
SDS: 0
TID: 1.04

## 2015-02-15 MED ORDER — TECHNETIUM TC 99M SESTAMIBI GENERIC - CARDIOLITE
10.8000 | Freq: Once | INTRAVENOUS | Status: AC | PRN
  Administered 2015-02-15: 10.8 via INTRAVENOUS

## 2015-02-15 MED ORDER — REGADENOSON 0.4 MG/5ML IV SOLN
0.4000 mg | Freq: Once | INTRAVENOUS | Status: AC
  Administered 2015-02-15: 0.4 mg via INTRAVENOUS

## 2015-02-15 MED ORDER — TECHNETIUM TC 99M SESTAMIBI GENERIC - CARDIOLITE
32.0000 | Freq: Once | INTRAVENOUS | Status: AC | PRN
  Administered 2015-02-15: 32 via INTRAVENOUS

## 2015-03-09 DIAGNOSIS — H00025 Hordeolum internum left lower eyelid: Secondary | ICD-10-CM | POA: Diagnosis not present

## 2015-03-31 DIAGNOSIS — Z961 Presence of intraocular lens: Secondary | ICD-10-CM | POA: Diagnosis not present

## 2015-03-31 DIAGNOSIS — H00025 Hordeolum internum left lower eyelid: Secondary | ICD-10-CM | POA: Diagnosis not present

## 2015-04-18 ENCOUNTER — Other Ambulatory Visit: Payer: Self-pay | Admitting: Internal Medicine

## 2015-04-18 NOTE — Telephone Encounter (Signed)
Rx request sent to pharmacy.  

## 2015-05-25 DIAGNOSIS — L729 Follicular cyst of the skin and subcutaneous tissue, unspecified: Secondary | ICD-10-CM | POA: Diagnosis not present

## 2015-05-25 DIAGNOSIS — D239 Other benign neoplasm of skin, unspecified: Secondary | ICD-10-CM | POA: Diagnosis not present

## 2015-05-25 DIAGNOSIS — L720 Epidermal cyst: Secondary | ICD-10-CM | POA: Diagnosis not present

## 2015-05-25 DIAGNOSIS — D485 Neoplasm of uncertain behavior of skin: Secondary | ICD-10-CM | POA: Diagnosis not present

## 2015-06-16 DIAGNOSIS — Z961 Presence of intraocular lens: Secondary | ICD-10-CM | POA: Diagnosis not present

## 2015-06-20 DIAGNOSIS — Z125 Encounter for screening for malignant neoplasm of prostate: Secondary | ICD-10-CM | POA: Diagnosis not present

## 2015-06-20 DIAGNOSIS — Z0000 Encounter for general adult medical examination without abnormal findings: Secondary | ICD-10-CM | POA: Diagnosis not present

## 2015-06-20 DIAGNOSIS — I10 Essential (primary) hypertension: Secondary | ICD-10-CM | POA: Diagnosis not present

## 2015-06-20 DIAGNOSIS — R739 Hyperglycemia, unspecified: Secondary | ICD-10-CM | POA: Diagnosis not present

## 2015-06-23 DIAGNOSIS — Z7982 Long term (current) use of aspirin: Secondary | ICD-10-CM | POA: Diagnosis not present

## 2015-06-23 DIAGNOSIS — Z1212 Encounter for screening for malignant neoplasm of rectum: Secondary | ICD-10-CM | POA: Diagnosis not present

## 2015-06-23 DIAGNOSIS — J301 Allergic rhinitis due to pollen: Secondary | ICD-10-CM | POA: Diagnosis not present

## 2015-06-23 DIAGNOSIS — Z23 Encounter for immunization: Secondary | ICD-10-CM | POA: Diagnosis not present

## 2015-06-23 DIAGNOSIS — Z0000 Encounter for general adult medical examination without abnormal findings: Secondary | ICD-10-CM | POA: Diagnosis not present

## 2015-06-27 DIAGNOSIS — E119 Type 2 diabetes mellitus without complications: Secondary | ICD-10-CM | POA: Diagnosis not present

## 2015-06-27 DIAGNOSIS — E7800 Pure hypercholesterolemia, unspecified: Secondary | ICD-10-CM | POA: Diagnosis not present

## 2015-06-27 DIAGNOSIS — I2510 Atherosclerotic heart disease of native coronary artery without angina pectoris: Secondary | ICD-10-CM | POA: Diagnosis not present

## 2015-07-07 DIAGNOSIS — E119 Type 2 diabetes mellitus without complications: Secondary | ICD-10-CM | POA: Diagnosis not present

## 2015-08-17 DIAGNOSIS — R198 Other specified symptoms and signs involving the digestive system and abdomen: Secondary | ICD-10-CM | POA: Diagnosis not present

## 2015-08-18 DIAGNOSIS — R197 Diarrhea, unspecified: Secondary | ICD-10-CM | POA: Diagnosis not present

## 2015-08-18 DIAGNOSIS — E119 Type 2 diabetes mellitus without complications: Secondary | ICD-10-CM | POA: Diagnosis not present

## 2015-08-31 DIAGNOSIS — N402 Nodular prostate without lower urinary tract symptoms: Secondary | ICD-10-CM | POA: Diagnosis not present

## 2015-09-15 DIAGNOSIS — N402 Nodular prostate without lower urinary tract symptoms: Secondary | ICD-10-CM | POA: Diagnosis not present

## 2015-09-16 ENCOUNTER — Encounter (HOSPITAL_COMMUNITY): Payer: Self-pay | Admitting: *Deleted

## 2015-09-16 ENCOUNTER — Emergency Department (HOSPITAL_COMMUNITY)
Admission: EM | Admit: 2015-09-16 | Discharge: 2015-09-16 | Disposition: A | Discharge status: Home / Self Care | Payer: Medicare Other | Attending: Emergency Medicine | Admitting: Emergency Medicine

## 2015-09-16 DIAGNOSIS — Z7984 Long term (current) use of oral hypoglycemic drugs: Secondary | ICD-10-CM | POA: Diagnosis not present

## 2015-09-16 DIAGNOSIS — Z79899 Other long term (current) drug therapy: Secondary | ICD-10-CM | POA: Diagnosis not present

## 2015-09-16 DIAGNOSIS — Z7982 Long term (current) use of aspirin: Secondary | ICD-10-CM | POA: Insufficient documentation

## 2015-09-16 DIAGNOSIS — Z951 Presence of aortocoronary bypass graft: Secondary | ICD-10-CM | POA: Insufficient documentation

## 2015-09-16 DIAGNOSIS — Z87891 Personal history of nicotine dependence: Secondary | ICD-10-CM | POA: Diagnosis not present

## 2015-09-16 DIAGNOSIS — M79605 Pain in left leg: Secondary | ICD-10-CM | POA: Diagnosis not present

## 2015-09-16 DIAGNOSIS — E119 Type 2 diabetes mellitus without complications: Secondary | ICD-10-CM | POA: Diagnosis not present

## 2015-09-16 DIAGNOSIS — M79662 Pain in left lower leg: Secondary | ICD-10-CM | POA: Diagnosis not present

## 2015-09-16 DIAGNOSIS — I2510 Atherosclerotic heart disease of native coronary artery without angina pectoris: Secondary | ICD-10-CM | POA: Diagnosis not present

## 2015-09-16 DIAGNOSIS — M7062 Trochanteric bursitis, left hip: Secondary | ICD-10-CM | POA: Diagnosis not present

## 2015-09-16 DIAGNOSIS — M25552 Pain in left hip: Secondary | ICD-10-CM | POA: Diagnosis not present

## 2015-09-16 LAB — I-STAT CHEM 8, ED
BUN: 16 mg/dL (ref 6–20)
CHLORIDE: 104 mmol/L (ref 101–111)
CREATININE: 0.9 mg/dL (ref 0.61–1.24)
Calcium, Ion: 1.19 mmol/L (ref 1.12–1.23)
GLUCOSE: 107 mg/dL — AB (ref 65–99)
HCT: 43 % (ref 39.0–52.0)
Hemoglobin: 14.6 g/dL (ref 13.0–17.0)
Potassium: 4.3 mmol/L (ref 3.5–5.1)
Sodium: 141 mmol/L (ref 135–145)
TCO2: 26 mmol/L (ref 0–100)

## 2015-09-16 LAB — D-DIMER, QUANTITATIVE (NOT AT ARMC): D DIMER QUANT: 0.34 ug{FEU}/mL (ref 0.00–0.50)

## 2015-09-16 NOTE — ED Notes (Signed)
Pt in c/o left calf pain that started this afternoon, pt had a prostate biopsy yesterday, called MD and they referred him here for blood clot r/o

## 2015-09-16 NOTE — Discharge Instructions (Signed)
You were seen and evaluated today for your intermittent leg pain. Please follow-up with your primary care physician for further evaluation. At this time it does not appear that you have a blood clot in your leg.  Musculoskeletal Pain Musculoskeletal pain is muscle and boney aches and pains. These pains can occur in any part of the body. Your caregiver may treat you without knowing the cause of the pain. They may treat you if blood or urine tests, X-rays, and other tests were normal.  CAUSES There is often not a definite cause or reason for these pains. These pains may be caused by a type of germ (virus). The discomfort may also come from overuse. Overuse includes working out too hard when your body is not fit. Boney aches also come from weather changes. Bone is sensitive to atmospheric pressure changes. HOME CARE INSTRUCTIONS   Ask when your test results will be ready. Make sure you get your test results.  Only take over-the-counter or prescription medicines for pain, discomfort, or fever as directed by your caregiver. If you were given medications for your condition, do not drive, operate machinery or power tools, or sign legal documents for 24 hours. Do not drink alcohol. Do not take sleeping pills or other medications that may interfere with treatment.  Continue all activities unless the activities cause more pain. When the pain lessens, slowly resume normal activities. Gradually increase the intensity and duration of the activities or exercise.  During periods of severe pain, bed rest may be helpful. Lay or sit in any position that is comfortable.  Putting ice on the injured area.  Put ice in a bag.  Place a towel between your skin and the bag.  Leave the ice on for 15 to 20 minutes, 3 to 4 times a day.  Follow up with your caregiver for continued problems and no reason can be found for the pain. If the pain becomes worse or does not go away, it may be necessary to repeat tests or do  additional testing. Your caregiver may need to look further for a possible cause. SEEK IMMEDIATE MEDICAL CARE IF:  You have pain that is getting worse and is not relieved by medications.  You develop chest pain that is associated with shortness or breath, sweating, feeling sick to your stomach (nauseous), or throw up (vomit).  Your pain becomes localized to the abdomen.  You develop any new symptoms that seem different or that concern you. MAKE SURE YOU:   Understand these instructions.  Will watch your condition.  Will get help right away if you are not doing well or get worse.   This information is not intended to replace advice given to you by your health care provider. Make sure you discuss any questions you have with your health care provider.   Document Released: 03/05/2005 Document Revised: 05/28/2011 Document Reviewed: 11/07/2012 Elsevier Interactive Patient Education [employer].

## 2015-09-16 NOTE — ED Provider Notes (Signed)
CSN: [id number]     Arrival date & time 09/16/15  18 History   First MD Initiated Contact with Patient 09/16/15 1705     Chief Complaint  Patient presents with  . Leg Pain   (Consider location/radiation/quality/duration/timing/severity/associated sxs/prior Treatment) HPI Patient presents with concerns of left calf pain started this afternoon. Patient had a prostate biopsy yesterday and a recent 5 hour trip last week. No history of DVT or PE. Not on any anticoagulation except aspirin (which he has recently discontinued for the biopsy) no active cancer (patient is being worked up for nodule but does not have a diagnosis of malignancy), shortness of breath or chest pain, no leg swelling or asymmetry, no recent surgery. Patient states the pain is immediately upon bearing weight, no recent falls. He states the pain is cramp-like and comes and goes with any pressure. No redness or swelling or rashes along the leg. No fevers no chills. He does have a coronary artery disease but does not describe the pain has progressively worse with exertion and it does not necessarily improve upon resting. The pain resolves with removal of weightbearing. He states it feels like he sprained his calf.  Past Medical History  Diagnosis Date  . CAD (coronary artery disease)     cath & CABG in 2008  . S/P CABG (coronary artery bypass graft) 2008    post-op AF with no recurrence   . Diabetes ([place])     type 2  . Dyslipidemia   . History of nuclear stress test 10/08/2007    bruce myoview; normal scan with attenuation artifact; no ischemia/infarct; low risk    Past Surgical History  Procedure Laterality Date  . Nasal septoplasty w/ turbinoplasty  1970s  . Cardiac catheterization  2003    total occlusion of LAD with left to left collaterals, mod disease of RCA & mild disease in Cfx (Dr. Rockne Menghini)  . Coronary artery bypass graft  05/31/2006    LIMA to LAD, SVG to OM1 & OM2, SVG to PDA (Dr. Prescott Gum)  . Carotid doppler   06/2010    stenosis of ICA less than 50%    Family History  Problem Relation Age of Onset  . Diabetes Mother     died of CVA at 92, also HTN  . Alzheimer's disease Father   . CAD Father   . Heart attack Father 52    also HTN  . Stroke Maternal Grandmother    Social History  Substance Use Topics  . Smoking status: Former Smoker    Quit date: 01/31/2007  . Smokeless tobacco: Never Used  . Alcohol Use: No     Comment: few drinks a month    Review of Systems  Constitutional: Negative for fever.  Musculoskeletal: Negative for joint swelling and arthralgias.  Skin: Negative for rash and wound.  Allergic/Immunologic: Negative for immunocompromised state.  Neurological: Negative for weakness.  Hematological: Does not bruise/bleed easily.  All other systems reviewed and are negative.     Allergies  Review of patient's allergies indicates no known allergies.  Home Medications   Prior to Admission medications   Medication Sig Start Date End Date Taking? Authorizing Provider  albuterol (PROVENTIL HFA;VENTOLIN HFA) 108 (90 BASE) MCG/ACT inhaler Inhale 2 puffs into the lungs every 6 (six) hours as needed for wheezing or shortness of breath.   Yes Historical Provider, MD  aspirin EC 81 MG tablet Take 81 mg by mouth at bedtime.   Yes Historical Provider, MD  Cyanocobalamin (VITAMIN B-12 SL) Place 1 tablet under the tongue daily.   Yes Historical Provider, MD  folic acid (FOLVITE) 1 MG tablet Take 1 mg by mouth daily.   Yes Historical Provider, MD  lisinopril (PRINIVIL,ZESTRIL) 5 MG tablet Take one-half tablet by  mouth daily Patient taking differently: Take one-half tablet by  mouth daily at bedtime 04/18/15  Yes Pixie Casino, MD  metFORMIN (GLUCOPHAGE) 500 MG tablet Take 500 mg by mouth 2 (two) times daily with a meal.    Yes Historical Provider, MD  Omega-3 Fatty Acids (FISH OIL PO) Take 1 capsule by mouth at bedtime.   Yes Historical Provider, MD  pravastatin (PRAVACHOL) 40 MG  tablet Take 1 tablet (40 mg total) by mouth daily. 02/17/14  Yes Pixie Casino, MD  ranitidine (ZANTAC) 300 MG tablet Take 300 mg by mouth daily. 07/05/15  Yes Historical Provider, MD   BP 131/75 mmHg  Pulse 74  Temp(Src) 98 F (36.7 C) (Oral)  Resp 16  SpO2 98% Physical Exam  Constitutional: He appears well-developed and well-nourished. No distress.  HENT:  Head: Normocephalic and atraumatic.  Left Ear: External ear normal.  Eyes: Conjunctivae are normal. Pupils are equal, round, and reactive to light. Right eye exhibits no discharge. Left eye exhibits no discharge.  Neck: Normal range of motion. Neck supple.  Cardiovascular: Normal rate and regular rhythm.   No murmur heard. Pulmonary/Chest: Effort normal and breath sounds normal. No respiratory distress.  Abdominal: Soft. Bowel sounds are normal. He exhibits no distension and no mass. There is no tenderness. There is no rebound and no guarding.  Musculoskeletal: Normal range of motion. He exhibits no edema or tenderness.  Left lower extremity without swelling, no pitting edema, no localized tenderness along the deep venous system, no swelling, no collateral superficial veins present, no asymmetry, no rash, no erythema, no warmth. 2+ DP and PT pulses. Normal sensation. Full range of motion of the knee/ankle. Good color.  Neurological: He is alert.  Skin: Skin is warm. He is not diaphoretic.  Psychiatric: He has a normal mood and affect.    ED Course  Procedures (including critical care time) Labs Review Labs Reviewed  I-STAT CHEM 8, ED - Abnormal; Notable for the following:    Glucose, Bld 107 (*)    All other components within normal limits  D-DIMER, QUANTITATIVE (NOT AT [facility])    Imaging Review No results found. I have personally reviewed and evaluated these images and lab results as part of my medical decision-making.   EKG Interpretation None      MDM   Final diagnoses:  Pain of left lower extremity    CMP  unremarkable, d-dimer negative and patient low risk for DVT. No ultrasound indicated. Patient's exam and reassuring without evidence of cellulitis, arterial occlusion, DVT, or other emergency. Likely musculoskeletal etiology. Patient safe for follow-up with primary care provider. Patient understands that he is to return for severe pain, fevers, numbness, or other concerning symptoms. Patient discharged in good condition.    Karma Greaser, MD 09/17/15 [id number]  Harvel Quale, MD 09/17/15 [id number]

## 2015-09-29 DIAGNOSIS — N402 Nodular prostate without lower urinary tract symptoms: Secondary | ICD-10-CM | POA: Diagnosis not present

## 2015-09-29 DIAGNOSIS — C61 Malignant neoplasm of prostate: Secondary | ICD-10-CM | POA: Diagnosis not present

## 2015-10-06 DIAGNOSIS — I10 Essential (primary) hypertension: Secondary | ICD-10-CM | POA: Diagnosis not present

## 2015-10-06 DIAGNOSIS — E7800 Pure hypercholesterolemia, unspecified: Secondary | ICD-10-CM | POA: Diagnosis not present

## 2015-10-06 DIAGNOSIS — E119 Type 2 diabetes mellitus without complications: Secondary | ICD-10-CM | POA: Diagnosis not present

## 2015-10-07 DIAGNOSIS — C61 Malignant neoplasm of prostate: Secondary | ICD-10-CM | POA: Diagnosis not present

## 2015-10-25 DIAGNOSIS — J069 Acute upper respiratory infection, unspecified: Secondary | ICD-10-CM | POA: Diagnosis not present

## 2015-10-25 DIAGNOSIS — Z789 Other specified health status: Secondary | ICD-10-CM | POA: Diagnosis not present

## 2015-11-14 DIAGNOSIS — H10502 Unspecified blepharoconjunctivitis, left eye: Secondary | ICD-10-CM | POA: Diagnosis not present

## 2015-11-28 ENCOUNTER — Other Ambulatory Visit (HOSPITAL_COMMUNITY): Payer: Self-pay | Admitting: Respiratory Therapy

## 2015-11-28 DIAGNOSIS — R05 Cough: Secondary | ICD-10-CM | POA: Diagnosis not present

## 2015-11-28 DIAGNOSIS — E119 Type 2 diabetes mellitus without complications: Secondary | ICD-10-CM | POA: Diagnosis not present

## 2015-11-28 DIAGNOSIS — Z23 Encounter for immunization: Secondary | ICD-10-CM | POA: Diagnosis not present

## 2015-11-28 DIAGNOSIS — I2510 Atherosclerotic heart disease of native coronary artery without angina pectoris: Secondary | ICD-10-CM | POA: Diagnosis not present

## 2015-11-28 DIAGNOSIS — R059 Cough, unspecified: Secondary | ICD-10-CM

## 2015-11-28 DIAGNOSIS — I10 Essential (primary) hypertension: Secondary | ICD-10-CM | POA: Diagnosis not present

## 2015-12-06 DIAGNOSIS — C61 Malignant neoplasm of prostate: Secondary | ICD-10-CM | POA: Diagnosis not present

## 2015-12-06 DIAGNOSIS — Z08 Encounter for follow-up examination after completed treatment for malignant neoplasm: Secondary | ICD-10-CM | POA: Diagnosis not present

## 2015-12-08 ENCOUNTER — Ambulatory Visit (HOSPITAL_COMMUNITY): Payer: Medicare Other

## 2015-12-12 DIAGNOSIS — J069 Acute upper respiratory infection, unspecified: Secondary | ICD-10-CM | POA: Diagnosis not present

## 2015-12-22 ENCOUNTER — Ambulatory Visit (HOSPITAL_COMMUNITY)
Admission: RE | Admit: 2015-12-22 | Discharge: 2015-12-22 | Disposition: A | Discharge status: Home / Self Care | Payer: Medicare Other | Source: Ambulatory Visit | Attending: Internal Medicine | Admitting: Internal Medicine

## 2015-12-22 DIAGNOSIS — R05 Cough: Secondary | ICD-10-CM | POA: Diagnosis not present

## 2015-12-26 DIAGNOSIS — J019 Acute sinusitis, unspecified: Secondary | ICD-10-CM | POA: Diagnosis not present

## 2015-12-26 DIAGNOSIS — R05 Cough: Secondary | ICD-10-CM | POA: Diagnosis not present

## 2015-12-26 LAB — PULMONARY FUNCTION TEST
DL/VA % pred: 53 %
DL/VA: 2.3 ml/min/mmHg/L
DLCO UNC % PRED: 45 %
DLCO UNC: 12.22 ml/min/mmHg
FEF 25-75 Pre: 0.49 L/sec
FEF2575-%Pred-Pre: 27 %
FEV1-%Pred-Pre: 58 %
FEV1-Pre: 1.47 L
FEV1FVC-%Pred-Pre: 63 %
FEV6-%PRED-PRE: 84 %
FEV6-Pre: 2.75 L
FEV6FVC-%Pred-Pre: 91 %
FVC-%Pred-Pre: 92 %
FVC-Pre: 3.23 L
PRE FEV1/FVC RATIO: 45 %
PRE FEV6/FVC RATIO: 85 %
RV % PRED: 203 %
RV: 4.82 L
TLC % pred: 133 %
TLC: 8.31 L

## 2016-01-12 DIAGNOSIS — J439 Emphysema, unspecified: Secondary | ICD-10-CM | POA: Diagnosis not present

## 2016-01-12 DIAGNOSIS — Z8709 Personal history of other diseases of the respiratory system: Secondary | ICD-10-CM | POA: Diagnosis not present

## 2016-02-03 ENCOUNTER — Encounter: Payer: Self-pay | Admitting: Pulmonary Disease

## 2016-02-03 ENCOUNTER — Other Ambulatory Visit: Payer: Self-pay | Admitting: Pulmonary Disease

## 2016-02-03 ENCOUNTER — Other Ambulatory Visit: Payer: Medicare Other

## 2016-02-03 ENCOUNTER — Ambulatory Visit (INDEPENDENT_AMBULATORY_CARE_PROVIDER_SITE_OTHER): Payer: Medicare Other | Admitting: Pulmonary Disease

## 2016-02-03 DIAGNOSIS — Z9109 Other allergy status, other than to drugs and biological substances: Secondary | ICD-10-CM

## 2016-02-03 DIAGNOSIS — J4489 Other specified chronic obstructive pulmonary disease: Secondary | ICD-10-CM

## 2016-02-03 DIAGNOSIS — I10 Essential (primary) hypertension: Secondary | ICD-10-CM | POA: Insufficient documentation

## 2016-02-03 DIAGNOSIS — J449 Chronic obstructive pulmonary disease, unspecified: Secondary | ICD-10-CM

## 2016-02-03 DIAGNOSIS — E781 Pure hyperglyceridemia: Secondary | ICD-10-CM | POA: Insufficient documentation

## 2016-02-03 DIAGNOSIS — C61 Malignant neoplasm of prostate: Secondary | ICD-10-CM | POA: Insufficient documentation

## 2016-02-03 HISTORY — DX: Other allergy status, other than to drugs and biological substances: Z91.09

## 2016-02-03 HISTORY — DX: Pure hyperglyceridemia: E78.1

## 2016-02-03 HISTORY — DX: Other specified chronic obstructive pulmonary disease: J44.89

## 2016-02-03 HISTORY — DX: Chronic obstructive pulmonary disease, unspecified: J44.9

## 2016-02-03 HISTORY — DX: Essential (primary) hypertension: I10

## 2016-02-03 NOTE — Progress Notes (Signed)
Subjective:    Patient ID: [name], male    DOB: 01/19/1941, 75 y.o.   MRN: [id number]  HPI Patient was started on Serevent Diskus on 10/26 with Atrovent HFA 4 times daily scheduled. The patient was only recently diagnosed with COPD. He reports over the last year he has had 3 "severe colds". He was treated with antibiotics but no Prednisone that he recalls. At the time he had a cough productive of a green mucus. He reports he has had allergies as a child that are seasonal as well as allergies to cats and hay. He denies any history of recurrent bronchitis prior to this year. He reports as a child he did have asthma/breathing problems. No history of pneumonia. He has been using an albuterol inhaler intermittent with wheezing & dyspnea, especially when he is around cats. He hasn't started the other inhalers he was prescribed. His wife reports he has a chronic cough. No nocturnal awakenings recently with breathing problems, coughing, or wheezing. Denies any recent sinus congestion or pressure. Reports "rare" reflux or dyspepsia. No morning brash water taste. He does take Zantac daily for previous peptic ulcer disease. No rashes but does bruise easily.   Review of Systems Does have some mild joint pain in his hip. Otherwise no joint swelling, stiffness, or erythema. Denies any dysuria or hematuria. A pertinent 14 point review of systems is negative except as per the history of presenting illness.  No Known Allergies  Current Outpatient Prescriptions on File Prior to Visit  Medication Sig Dispense Refill  . albuterol (PROVENTIL HFA;VENTOLIN HFA) 108 (90 BASE) MCG/ACT inhaler Inhale 2 puffs into the lungs every 6 (six) hours as needed for wheezing or shortness of breath.    [name] aspirin EC 81 MG tablet Take 81 mg by mouth at bedtime.    . Cyanocobalamin (VITAMIN B-12 SL) Place 1 tablet under the tongue daily.    . folic acid (FOLVITE) 1 MG tablet Take 1 mg by mouth daily.    [name] lisinopril  (PRINIVIL,ZESTRIL) 5 MG tablet Take one-half tablet by  mouth daily 45 tablet 2  . metFORMIN (GLUCOPHAGE) 500 MG tablet Take 500 mg by mouth 2 (two) times daily with a meal.     . Omega-3 Fatty Acids (FISH OIL PO) Take 1 capsule by mouth at bedtime.    . pravastatin (PRAVACHOL) 40 MG tablet Take 1 tablet (40 mg total) by mouth daily. 90 tablet 3  . ranitidine (ZANTAC) 300 MG tablet Take 300 mg by mouth daily.     No current facility-administered medications on file prior to visit.     Past Medical History:  Diagnosis Date  . Asthma   . CAD (coronary artery disease)    cath & CABG in 2008  . COPD (chronic obstructive pulmonary disease) ([place])   . Diabetes ([place])    type 2  . Dyslipidemia   . History of nuclear stress test 10/08/2007   bruce myoview; normal scan with attenuation artifact; no ischemia/infarct; low risk   . Prostate cancer ([place])   . S/P CABG (coronary artery bypass graft) 2008   post-op AF with no recurrence     Past Surgical History:  Procedure Laterality Date  . CARDIAC CATHETERIZATION  2003   total occlusion of LAD with left to left collaterals, mod disease of RCA & mild disease in Cfx (Dr. Rockne Menghini)  . Carotid Doppler  06/2010   stenosis of ICA less than 50%   . CORONARY ARTERY BYPASS  GRAFT  05/31/2006   LIMA to LAD, SVG to OM1 & OM2, SVG to PDA (Dr. Prescott Gum)  . NASAL SEPTOPLASTY W/ TURBINOPLASTY  1970s  . PROSTATE BIOPSY    . TONSILLECTOMY      Family History  Problem Relation Age of Onset  . Diabetes Mother     died of CVA at 21, also HTN  . Alzheimer's disease Father   . CAD Father   . Heart attack Father 75    also HTN  . Stroke Maternal Grandmother   . Lung disease Neg Hx     Social History   Social History  . Marital status: Married    Spouse name: N/A  . Number of children: N/A  . Years of education: N/A   Social History Main Topics  . Smoking status: Former Smoker    Packs/day: 1.00    Years: 43.00    Types: Cigarettes    Start  date: 12/25/1963    Quit date: 01/31/2007  . Smokeless tobacco: Never Used  . Alcohol use 0.0 oz/week     Comment: few drinks a month  . Drug use: No  . Sexual activity: Not Asked   Other Topics Concern  . None   Social History Narrative   Originally from [place], [place]. Moved to [place] in 1968. 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 He grew up in [place], [place]. Currently has a dog. No bird or mold exposure.       Objective:   Physical Exam BP 116/64 (BP Location: Left Arm, Cuff Size: Normal)   Pulse 70   Ht 5\' 6"  (1.676 m)   Wt 148 lb (67.1 kg)   SpO2 97%   BMI 23.89 kg/m  General:  Awake. Alert. No acute distress. Wife with the patient today. Integument:  Warm & dry. No rash on exposed skin. No bruising. Lymphatics:  No appreciated cervical or supraclavicular lymphadenoapthy. HEENT:  Moist mucus membranes. No oral ulcers. No scleral injection or icterus.  Cardiovascular:  Regular rate. No edema. Normal S1 & S2.  Pulmonary:  Good aeration & clear to auscultation bilaterally. Symmetric chest wall expansion. No accessory muscle use. Abdomen: Soft. Normal bowel sounds. Nondistended. Grossly nontender. Musculoskeletal:  Normal bulk and tone. Hand grip strength 5/5 bilaterally. No joint deformity or effusion appreciated. Neurological:  CN 2-12 grossly in tact. No meningismus. Moving all 4 extremities equally. Symmetric brachioradialis deep tendon reflexes. Psychiatric:  Mood and affect congruent. Speech normal rhythm, rate & tone.   PFT 12/22/15: FVC 3.23 L (92%) FEV1 1.47 L (58%) FEV1/FVC 0.45 FEF 25-75 0.49 L (26%) TLC 8.31 L (133%) RV 203% ERV 218% DLCO uncorrected 45%    Assessment & Plan:  75 y.o. male with newly diagnosed COPD. Patient appears to have mixed type based on spirometry with evidence of air trapping and hyperinflation as well on lung volumes and personal history of environmental allergies. I spent a significant amount  time today discussing the pathophysiology of asthma as well as COPD. I instructed the patient contact my office if he had any new breathing problems or questions before his next appointment.  1. COPD Mixed Type: Screening for alpha-1 antitrypsin deficiency. Repeat spirometry with bronchodilator challenge and next appointment. Starting patient on Qvar 2 puffs twice daily. 2. Environmental Allergies: Checking RAST panel. 3. Health Maintenance:  S/P Influenza Vaccine October 2017. Patient's wife investigating on pneumonia vaccines. 4. Follow-up: Return to clinic in  6 weeks or sooner if needed.  Sonia Baller Ashok Cordia, M.D. [facility] Pager:  [phone] After 3pm or if no response, call [phone] 11:28 AM 02/03/16

## 2016-02-03 NOTE — Patient Instructions (Addendum)
   Continue using your Albuterol/ProAir inhaler as needed.  Use the Qvar inhaler we have given you - 2 puff twice daily. Make sure you rinse, gargle, spit, brush your teeth, & brush your tongue after you use this to keep from getting thrush.   Call me if you have any new breathing problems before before your next appointment.  TESTS ORDERED: 1. 6MWT on room air at next appointment 2. Spirometry with bronchodilator at next appointment 3. Serum alpha-1 antitrypsin & RAST Panel today

## 2016-02-06 LAB — RESPIRATORY ALLERGY PROFILE REGION II ~~LOC~~
ALLERGEN, A. ALTERNATA, M6: 4.8 kU/L — AB
ALLERGEN, CEDAR TREE, T6: 0.12 kU/L — AB
ALLERGEN, D PTERNOYSSINUS, D1: 6.23 kU/L — AB
ALLERGEN, P. NOTATUM, M1: 0.55 kU/L — AB
Allergen, C. Herbarum, M2: 0.97 kU/L — ABNORMAL HIGH
Allergen, Comm Silver Birch, t9: <0.1 kU/L
Allergen, Cottonwood, t14: <0.1 kU/L
Allergen, Mouse Urine Protein, e78: 0.82 kU/L — ABNORMAL HIGH
Allergen, Mulberry, t76: <0.1 kU/L
Allergen, Oak,t7: <0.1 kU/L
Bermuda Grass: <0.1 kU/L
COMMON RAGWEED: 0.18 kU/L — AB
Cat Dander: 27.5 kU/L — ABNORMAL HIGH
Cockroach: 0.18 kU/L — ABNORMAL HIGH
D. FARINAE: 9.29 kU/L — AB
Dog Dander: 6.74 kU/L — ABNORMAL HIGH
Elm IgE: <0.1 kU/L
IgE (Immunoglobulin E), Serum: 689 kU/L — ABNORMAL HIGH (ref <115)
ROUGH PIGWEED IGE: 0.27 kU/L — AB
TIMOTHY GRASS: 2.24 kU/L — AB

## 2016-02-11 LAB — ALPHA-1 ANTITRYPSIN PHENOTYPE: A-1 Antitrypsin: 134 mg/dL (ref 83–199)

## 2016-02-15 ENCOUNTER — Ambulatory Visit (INDEPENDENT_AMBULATORY_CARE_PROVIDER_SITE_OTHER): Payer: Medicare Other | Admitting: Internal Medicine

## 2016-02-15 ENCOUNTER — Telehealth: Payer: Self-pay | Admitting: Internal Medicine

## 2016-02-15 ENCOUNTER — Encounter: Payer: Self-pay | Admitting: Internal Medicine

## 2016-02-15 VITALS — BP 136/80 | HR 72 | Ht 66.0 in | Wt 151.4 lb

## 2016-02-15 DIAGNOSIS — I255 Ischemic cardiomyopathy: Secondary | ICD-10-CM | POA: Diagnosis not present

## 2016-02-15 DIAGNOSIS — Z951 Presence of aortocoronary bypass graft: Secondary | ICD-10-CM

## 2016-02-15 DIAGNOSIS — E785 Hyperlipidemia, unspecified: Secondary | ICD-10-CM

## 2016-02-15 DIAGNOSIS — I10 Essential (primary) hypertension: Secondary | ICD-10-CM | POA: Diagnosis not present

## 2016-02-15 MED ORDER — LISINOPRIL 5 MG PO TABS: 2.5000 mg | ORAL_TABLET | Freq: Two times a day (BID) | ORAL | 2 refills | Status: DC

## 2016-02-15 NOTE — Telephone Encounter (Signed)
Returned call and spoke to [name]. She's aware she may drop off results from outside lab from April 2017. This was for lipid and CMET. Will have converted to EMR & review w Dr. Debara Pickett to see if recommended to recheck at interval based on available data.

## 2016-02-15 NOTE — Patient Instructions (Signed)
Medication Instructions:  DOUBLE UP on your Lisinopril--CALL THE OFFICE ONCE YOU GET HOME TO GIVE [place] YOUR CURRENT DOSAGE OF LISINOPRIL  Labwork: Your physician recommends that you return for lab work in: [place] FASTING-CMP, LIPIDS  Testing/Procedures: Your physician has requested that you have an echocardiogram. Echocardiography is a painless test that uses sound waves to create images of your heart. It provides your doctor with information about the size and shape of your heart and how well your heart's chambers and valves are working. This procedure takes approximately one hour. There are no restrictions for this procedure.  Follow-Up: Your physician wants you to follow-up in: [place]. You will receive a reminder letter in the mail two months in advance. If you don't receive a letter, please call our office to schedule the follow-up appointment.  Any Other Special Instructions Will Be Listed Below (If Applicable).     If you need a refill on your cardiac medications before your next appointment, please call your pharmacy.

## 2016-02-15 NOTE — Telephone Encounter (Signed)
Pt was seen today and labs were ordered, he had them done by PCP in April, does he need to repeat them? Wife will bring the results by the office tomorrow

## 2016-02-16 ENCOUNTER — Telehealth: Payer: Self-pay | Admitting: *Deleted

## 2016-02-16 DIAGNOSIS — I255 Ischemic cardiomyopathy: Secondary | ICD-10-CM

## 2016-02-16 HISTORY — DX: Ischemic cardiomyopathy: I25.5

## 2016-02-16 NOTE — Progress Notes (Addendum)
OFFICE NOTE  Chief Complaint:  No complaints  Primary Care Physician: Horatio Pel, MD  HPI:  [name] is a pleasant 75 yo male with a history of CAD and emergent CABG in 2008 x 4 vessels (LIMA to LAD, sequential SVG to OM1 and OM 2, SVG to PDA) , complicated by transient postop atrial fibrillation. He has had no recurrence of any arrhythmias.  He has been maintained on 325 mg aspirin.  Any recurrence of chest pain. Recently was seen in Dr. Julianne Rice office after an episode of hypotension and bradycardia. He was noted to be pale and somewhat lethargic. His wife had noted that he had dark stools and there was concern for possible GI bleeding. He did have laboratory work which indicated an acute drop in hemoglobin and hematocrit. His H&H was 14 and 41 on 07/28/2012 and decreased to 10 and 31 on 01/29/2013.  Because he was having some confusion and dizziness apparently he underwent a CT scan of the head which was negative and a chest x-ray which was unremarkable. Stool Y. x-ray never performed. He was however referred to a GI doctor and has an upcoming appointment to his blood pressure medicine was decreased due to hypotension and is normotensive today. He still reports some fatigue but his symptoms have improved he denies any ongoing melena or hematochezia.  [name] returns today for follow-up. It turns out that he did have some gastric erosions which appears to have healed on a PPI. I reduced his aspirin 81 mg and he's had no further bleeding episodes. He denies any chest pain or shortness of breath.   I saw [name] back in the office today. Overall he seems to be doing well and is asymptomatic. He had recent laboratory work which again shows a stable cholesterol with a total around 150 and LDL around 80. He is tolerating medications without any difficulty. He remains physically active without any shortness of breath or chest pain.  It is been 8 years since his bypass surgery and he  has not had reassessment of he is bypass graft since that time.  02/15/2016  [name] returns today for follow-up. Overall he feels well. He denies any chest pain or worsening shortness of breath. He's had no recurrent A. fib. His blood pressure is well-controlled. Weight is within normal limits. He was switched from lisinopril to losartan 25 mg daily due to cough.  PMHx:  Past Medical History:  Diagnosis Date  . Asthma   . CAD (coronary artery disease)    cath & CABG in 2008  . COPD (chronic obstructive pulmonary disease) ([place])   . Diabetes ([place])    type 2  . Dyslipidemia   . History of nuclear stress test 10/08/2007   bruce myoview; normal scan with attenuation artifact; no ischemia/infarct; low risk   . Prostate cancer ([place])   . S/P CABG (coronary artery bypass graft) 2008   post-op AF with no recurrence     Past Surgical History:  Procedure Laterality Date  . CARDIAC CATHETERIZATION  2003   total occlusion of LAD with left to left collaterals, mod disease of RCA & mild disease in Cfx (Dr. Rockne Menghini)  . Carotid Doppler  06/2010   stenosis of ICA less than 50%   . CORONARY ARTERY BYPASS GRAFT  05/31/2006   LIMA to LAD, SVG to OM1 & OM2, SVG to PDA (Dr. Prescott Gum)  . NASAL SEPTOPLASTY W/ TURBINOPLASTY  1970s  . PROSTATE BIOPSY    .  TONSILLECTOMY      FAMHx:  Family History  Problem Relation Age of Onset  . Diabetes Mother     died of CVA at 34, also HTN  . Alzheimer's disease Father   . CAD Father   . Heart attack Father 32    also HTN  . Stroke Maternal Grandmother   . Lung disease Neg Hx     SOCHx:   reports that he quit smoking about 9 years ago. His smoking use included Cigarettes. He started smoking about 52 years ago. He has a 43.00 pack-year smoking history. He has never used smokeless tobacco. He reports that he drinks alcohol. He reports that he does not use drugs.  ALLERGIES:  No Known Allergies  ROS: A comprehensive review of systems was  negative.  HOME MEDS: Current Outpatient Prescriptions  Medication Sig Dispense Refill  . beclomethasone (QVAR) 40 MCG/ACT inhaler Inhale 2 puffs into the lungs 2 (two) times daily.    [name] aspirin EC 81 MG tablet Take 81 mg by mouth at bedtime.    . Cyanocobalamin (VITAMIN B-12 SL) Place 1 tablet under the tongue daily.    . folic acid (FOLVITE) 1 MG tablet Take 1 mg by mouth daily.    [name] lisinopril (PRINIVIL,ZESTRIL) 5 MG tablet Take 0.5 tablets (2.5 mg total) by mouth 2 (two) times daily. 60 tablet 2  . metFORMIN (GLUCOPHAGE) 500 MG tablet Take 500 mg by mouth 2 (two) times daily with a meal.     . Omega-3 Fatty Acids (FISH OIL PO) Take 1 capsule by mouth at bedtime.    . pantoprazole (PROTONIX) 40 MG tablet Take 1 tablet by mouth as directed.    . pravastatin (PRAVACHOL) 40 MG tablet Take 1 tablet (40 mg total) by mouth daily. 90 tablet 3  . ranitidine (ZANTAC) 300 MG tablet Take 300 mg by mouth daily.     No current facility-administered medications for this visit.     LABS/IMAGING: No results found for this or any previous visit (from the past 48 hour(s)). No results found.  VITALS: BP 136/80   Pulse 72   Ht 5\' 6"  (1.676 m)   Wt 151 lb 6.4 oz (68.7 kg)   BMI 24.44 kg/m   EXAM: General appearance: alert and no distress Neck: no carotid bruit and no JVD Lungs: clear to auscultation bilaterally Heart: regular rate and rhythm, S1, S2 normal, no murmur, click, rub or gallop Abdomen: soft, non-tender; bowel sounds normal; no masses,  no organomegaly Extremities: extremities normal, atraumatic, no cyanosis or edema Pulses: 2+ and symmetric Skin: Skin color pale, texture, turgor normal. No rashes or lesions Neurologic: Grossly normal Psych: Mood, affect normal  EKG: Sinus rhythm at 72  ASSESSMENT: 1. Coronary artery disease status post four-vessel CABG in 2008 2. Postoperative atrial fibrillation without recurrence 3. Diabetes type 2 4. Dyslipidemia 5.   History of GI  bleeding  PLAN: 1.   [name] seems to be doing fairly well without any recurrent A. Fib. His diabetes is well controlled. Cholesterol is at goal. Stress testing last year was negative for ischemia. Blood pressure is Well controlled-continue current dose of losartan. Overall he think he is doing well. Check routine FLP and CMET. Follow-up with me in 6 months or sooner as necessary.  Pixie Casino, MD, [facility] Attending Cardiologist [facility]  Pixie Casino 02/16/2016, 5:50 PM

## 2016-02-16 NOTE — Telephone Encounter (Signed)
Keep on losartan 25 mg daily.  Dr. Lemmie Evens

## 2016-02-16 NOTE — Telephone Encounter (Signed)
Pt of Dr. Debara Pickett Seen 11/29 and had med adjustment  Wife wanted to clarify that the patient was not taking lisinopril when seen the other day - he was actually taking losartan 25mg  daily as prescribed by his PCP. He was switched due to persistent dry cough on ACE  He received instruction to double the dose of lisinopril - since he is not taking this, does he need follow that instruction with the losartan, or keep it at the 25mg  daily?  Also had labwork to be scanned. Nothing more recent than April of 2017 but wife wanted to know if pt needs the lipid panel done at this time or if this can wait. I will put to attn of medical records to scan.  Msg routed to Dr. Debara Pickett.

## 2016-02-20 NOTE — Telephone Encounter (Signed)
Left detailed msg (OK per DPR) w instruction to call if further questions.

## 2016-02-23 DIAGNOSIS — J439 Emphysema, unspecified: Secondary | ICD-10-CM | POA: Diagnosis not present

## 2016-03-01 ENCOUNTER — Telehealth: Payer: Self-pay | Admitting: Pulmonary Disease

## 2016-03-01 MED ORDER — BECLOMETHASONE DIPROPIONATE 80 MCG/ACT IN AERS: 1.0000 | INHALATION_SPRAY | Freq: Two times a day (BID) | RESPIRATORY_TRACT | 0 refills | Status: DC

## 2016-03-01 NOTE — Telephone Encounter (Signed)
We can do Qvar 51mcg. Just do 1 puff twice daily. Thanks.

## 2016-03-01 NOTE — Telephone Encounter (Signed)
Spoke with pt's wife, aware of recs. Sample left up front for pickup.  Nothing further needed.

## 2016-03-01 NOTE — Telephone Encounter (Signed)
Called and spoke with pts wife and she is aware that we do not have any samples of the qvar 40.    JN she stated that the pt is in the donut hole and cannot afford to pay over $200 for the qvar 40.  We do have some samples of the qvar 80 here, or if you wanted to try him on something else.  She stated that they think that this medication has helped some.  She stated that the pt has appt with JN in January and will have a different insurance starting Jan 1.  JN please advise. thanks

## 2016-03-08 ENCOUNTER — Ambulatory Visit (HOSPITAL_COMMUNITY): Payer: Medicare Other | Attending: Internal Medicine

## 2016-03-08 ENCOUNTER — Other Ambulatory Visit: Payer: Self-pay

## 2016-03-08 DIAGNOSIS — I340 Nonrheumatic mitral (valve) insufficiency: Secondary | ICD-10-CM | POA: Insufficient documentation

## 2016-03-08 DIAGNOSIS — I255 Ischemic cardiomyopathy: Secondary | ICD-10-CM | POA: Diagnosis not present

## 2016-03-09 ENCOUNTER — Telehealth: Payer: Self-pay | Admitting: Pulmonary Disease

## 2016-03-09 NOTE — Telephone Encounter (Signed)
IMAGING CXR PA/LAT 11/28/15 (personally reviewed by me):  Mild flattening of the diaphragms. Low lung volumes. No pleural effusion appreciated. No parenchymal opacity or mass appreciated. Heart normal in size & mediastinum normal in contour.

## 2016-03-16 ENCOUNTER — Ambulatory Visit (INDEPENDENT_AMBULATORY_CARE_PROVIDER_SITE_OTHER): Payer: Medicare Other | Admitting: Pulmonary Disease

## 2016-03-16 DIAGNOSIS — J449 Chronic obstructive pulmonary disease, unspecified: Secondary | ICD-10-CM | POA: Diagnosis not present

## 2016-03-16 LAB — PULMONARY FUNCTION TEST
FEF 25-75 Post: 1.06 L/s
FEF 25-75 Pre: 0.7 L/s
FEF2575-%Change-Post: 50 %
FEF2575-%Pred-Post: 60 %
FEF2575-%Pred-Pre: 40 %
FEV1-%Change-Post: 16 %
FEV1-%Pred-Post: 70 %
FEV1-%Pred-Pre: 60 %
FEV1-Post: 1.74 L
FEV1-Pre: 1.49 L
FEV1FVC-%Change-Post: -2 %
FEV1FVC-%Pred-Pre: 75 %
FEV6-%Change-Post: 16 %
FEV6-%Pred-Post: 98 %
FEV6-%Pred-Pre: 84 %
FEV6-Post: 3.18 L
FEV6-Pre: 2.73 L
FEV6FVC-%Change-Post: -2 %
FEV6FVC-%Pred-Post: 104 %
FEV6FVC-%Pred-Pre: 107 %
FVC-%Change-Post: 19 %
FVC-%Pred-Post: 93 %
FVC-%Pred-Pre: 78 %
FVC-Post: 3.27 L
FVC-Pre: 2.74 L
Post FEV1/FVC ratio: 53 %
Post FEV6/FVC ratio: 98 %
Pre FEV1/FVC ratio: 54 %
Pre FEV6/FVC Ratio: 100 %

## 2016-03-16 NOTE — Progress Notes (Signed)
Pre & Post Spirometry was performed today.

## 2016-03-22 ENCOUNTER — Ambulatory Visit (INDEPENDENT_AMBULATORY_CARE_PROVIDER_SITE_OTHER): Payer: Medicare Other | Admitting: Pulmonary Disease

## 2016-03-22 ENCOUNTER — Encounter: Payer: Self-pay | Admitting: Pulmonary Disease

## 2016-03-22 VITALS — BP 124/68 | HR 73 | Ht 66.0 in | Wt 152.6 lb

## 2016-03-22 DIAGNOSIS — Z23 Encounter for immunization: Secondary | ICD-10-CM

## 2016-03-22 DIAGNOSIS — R0602 Shortness of breath: Secondary | ICD-10-CM

## 2016-03-22 DIAGNOSIS — J449 Chronic obstructive pulmonary disease, unspecified: Secondary | ICD-10-CM | POA: Diagnosis not present

## 2016-03-22 DIAGNOSIS — Z9109 Other allergy status, other than to drugs and biological substances: Secondary | ICD-10-CM

## 2016-03-22 MED ORDER — ALBUTEROL SULFATE HFA 108 (90 BASE) MCG/ACT IN AERS: 2.0000 | INHALATION_SPRAY | Freq: Four times a day (QID) | RESPIRATORY_TRACT | 3 refills | Status: DC | PRN

## 2016-03-22 MED ORDER — AEROCHAMBER MV MISC: 0 refills | Status: DC

## 2016-03-22 MED ORDER — BUDESONIDE-FORMOTEROL FUMARATE 160-4.5 MCG/ACT IN AERO: 2.0000 | INHALATION_SPRAY | Freq: Two times a day (BID) | RESPIRATORY_TRACT | 3 refills | Status: DC

## 2016-03-22 MED ORDER — MONTELUKAST SODIUM 10 MG PO TABS: 10.0000 mg | ORAL_TABLET | Freq: Every day | ORAL | 3 refills | Status: DC

## 2016-03-22 NOTE — Addendum Note (Signed)
Addended by: Parke Poisson E on: 03/22/2016 05:19 PM   Modules accepted: Orders

## 2016-03-22 NOTE — Progress Notes (Signed)
Test reviewed.  

## 2016-03-22 NOTE — Patient Instructions (Signed)
   Remember to rinse, gargle & spit after the Symbicort inhaler.  Remember to use the spacer with your Symbicort inhaler but you can also use it with your Ventolin inhaler.  Stop using the Qvar inhaler.  I will see you back in 3 months with a breathing test or sooner if needed.  TESTS ORDERED: 1. Spirometry with bronchodilator challenge at next appointment

## 2016-03-22 NOTE — Progress Notes (Signed)
Subjective:    Patient ID: [name], male    DOB: 10-29-1940, 76 y.o.   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   HPI COPD Mixed Type:  Started on Qvar 80 g 2 puffs twice daily at last appointment. His wife reports he has been coughing frequently and clears his throat. His wife feels his cough is more productive. No audible wheezing. Does wake up at night occasional to cough into his handkerchief.   Environmental Allergies:  Patient tested positive for multiple environmental allergens including dogs and cats.  Review of Systems No chest tightness or pressure. No fever, chills, or sweats. No rashes or bruising.   No Known Allergies  Current Outpatient Prescriptions on File Prior to Visit  Medication Sig Dispense Refill  . aspirin EC 81 MG tablet Take 81 mg by mouth at bedtime.    . beclomethasone (QVAR) 80 MCG/ACT inhaler Inhale 1 puff into the lungs 2 (two) times daily. 1 Inhaler 0  . Cyanocobalamin (VITAMIN B-12 SL) Place 1 tablet under the tongue daily.    . folic acid (FOLVITE) 1 MG tablet Take 1 mg by mouth daily.    [name] lisinopril (PRINIVIL,ZESTRIL) 5 MG tablet Take 0.5 tablets (2.5 mg total) by mouth 2 (two) times daily. 60 tablet 2  . metFORMIN (GLUCOPHAGE) 500 MG tablet Take 500 mg by mouth 2 (two) times daily with a meal.     . Omega-3 Fatty Acids (FISH OIL PO) Take 1 capsule by mouth at bedtime.    . pantoprazole (PROTONIX) 40 MG tablet Take 1 tablet by mouth as directed.    . pravastatin (PRAVACHOL) 40 MG tablet Take 1 tablet (40 mg total) by mouth daily. 90 tablet 3  . beclomethasone (QVAR) 40 MCG/ACT inhaler Inhale 2 puffs into the lungs 2 (two) times daily.     No current facility-administered medications on file prior to visit.     Past Medical History:  Diagnosis Date  . Asthma   . CAD (coronary artery disease)    cath & CABG in 2008  . COPD (chronic obstructive pulmonary disease) ([place])   . Diabetes ([place])    type 2    . Dyslipidemia   . History of nuclear stress test 10/08/2007   bruce myoview; normal scan with attenuation artifact; no ischemia/infarct; low risk   . Prostate cancer ([place])   . S/P CABG (coronary artery bypass graft) 2008   post-op AF with no recurrence     Past Surgical History:  Procedure Laterality Date  . CARDIAC CATHETERIZATION  2003   total occlusion of LAD with left to left collaterals, mod disease of RCA & mild disease in Cfx (Dr. Rockne Menghini)  . Carotid Doppler  06/2010   stenosis of ICA less than 50%   . CORONARY ARTERY BYPASS GRAFT  05/31/2006   LIMA to LAD, SVG to OM1 & OM2, SVG to PDA (Dr. Prescott Gum)  . NASAL SEPTOPLASTY W/ TURBINOPLASTY  1970s  . PROSTATE BIOPSY    . TONSILLECTOMY      Family History  Problem Relation Age of Onset  . Diabetes Mother     died of CVA at 37, also HTN  . Alzheimer's disease Father   . CAD Father   . Heart attack Father 32    also HTN  . Stroke Maternal Grandmother   . Lung disease Neg Hx     Social History   Social History  . Marital status:  Married    Spouse name: N/A  . Number of children: N/A  . Years of education: N/A   Social History Main Topics  . Smoking status: Former Smoker    Packs/day: 1.00    Years: 43.00    Types: Cigarettes    Start date: 12/25/1963    Quit date: 01/31/2007  . Smokeless tobacco: Never Used  . Alcohol use 0.0 oz/week     Comment: few drinks a month  . Drug use: No  . Sexual activity: Not Asked   Other Topics Concern  . None   Social History Narrative   Originally from [place], [place]. Moved to [place] in 1968. 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 He grew up in [place], [place]. Currently has a dog. No bird or mold exposure.       Objective:   Physical Exam BP 124/68 (BP Location: Left Arm, Cuff Size: Normal)   Pulse 73   Ht 5\' 6"  (1.676 m)   Wt 152 lb 9.6 oz (69.2 kg)   SpO2 96%   BMI 24.63 kg/m  General:  Awake. Alert. No distress. Wife  with the patient today. Integument:  Warm & dry. No rash on exposed skin.  Lymphatics:  No appreciated cervical or supraclavicular lymphadenoapthy. HEENT:  Moist mucus membranes. No oral ulcers. No nasal turbinate swelling. Cardiovascular:  Regular rate. No edema. Normal S1 & S2.  Pulmonary:  Clear with auscultation bilaterally. No accessory muscle use. Speaking in complete sentences. Abdomen: Soft. Normal bowel sounds. Nontender.  PFT 03/16/16: FVC 2.74 L (78%) FEV1 1.49 L (60%) FEV1/FVC 0.54 FEF 25-75 0.70 L (40%) positive bronchodilator response 12/22/15: FVC 3.23 L (92%) FEV1 1.47 L (58%) FEV1/FVC 0.45 FEF 25-75 0.49 L (26%)                                                     TLC 8.31 L (133%) RV 203% ERV 218% DLCO uncorrected 45%  6MWT 03/22/16:  Walked 438 meters / Baseline Sat 97% on RA / nadir Sat 97% on RA  LABS 02/03/16 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) RAST Panel:  Cat Dander 27.5 / D farinae 9.29 / Dog Dander 6.74 / Multiple Positives IgE:  689    Assessment & Plan:  76 y.o. male with COPD mixed type & multiple environmental allergens. Patient does have multiple potential causes for worsening of his underlying COPD/asthma. His spirometry today has significantly worsened since previous testing which is likely multifactorial given his significant bronchodilator response. I do feel he would benefit from a long-acting bronchodilator in his regimen. He may also benefit from the addition of Singulair to his regimen. I instructed the patient to notify my office if he had any new breathing problems or questions before his next appointment.  1. COPD Mixed Type: Starting patient on Singulair today. Switching from Qvar to Symbicort 160/4.5 with a spacer. Also prescribing the patient an albuterol inhaler to use as needed. 2. Environmental Allergies: Starting patient on Singulair 10 mg by mouth daily at bedtime. 3. Health Maintenance:  S/P Influenza Vaccine October 2017. Previously had Pneumovax  per wife. Administering Prevnar vaccine today.  4. Follow-up: Return to clinic in 3 months or sooner if needed.  Sonia Baller Ashok Cordia, M.D. [facility] Pulmonary & Critical Care Pager:  [phone] After 3pm or if no response, call [phone] 3:31 PM 03/22/16

## 2016-03-22 NOTE — Progress Notes (Signed)
Patient seen in the office today and instructed on use of Symbicort 160 and Aerochamber.  Patient expressed understanding and demonstrated technique. Parke Poisson, CMA 03/22/16

## 2016-03-29 DIAGNOSIS — M79675 Pain in left toe(s): Secondary | ICD-10-CM | POA: Diagnosis not present

## 2016-04-03 DIAGNOSIS — R972 Elevated prostate specific antigen [PSA]: Secondary | ICD-10-CM | POA: Diagnosis not present

## 2016-04-10 DIAGNOSIS — C61 Malignant neoplasm of prostate: Secondary | ICD-10-CM | POA: Diagnosis not present

## 2016-04-10 DIAGNOSIS — N402 Nodular prostate without lower urinary tract symptoms: Secondary | ICD-10-CM | POA: Diagnosis not present

## 2016-06-26 ENCOUNTER — Other Ambulatory Visit: Payer: Self-pay | Admitting: Pulmonary Disease

## 2016-06-26 DIAGNOSIS — R0600 Dyspnea, unspecified: Secondary | ICD-10-CM

## 2016-06-27 ENCOUNTER — Encounter: Payer: Self-pay | Admitting: Pulmonary Disease

## 2016-06-27 ENCOUNTER — Ambulatory Visit (INDEPENDENT_AMBULATORY_CARE_PROVIDER_SITE_OTHER): Payer: Medicare Other | Admitting: Pulmonary Disease

## 2016-06-27 VITALS — BP 110/80 | HR 78 | Ht 66.0 in | Wt 151.0 lb

## 2016-06-27 DIAGNOSIS — J449 Chronic obstructive pulmonary disease, unspecified: Secondary | ICD-10-CM

## 2016-06-27 DIAGNOSIS — Z9109 Other allergy status, other than to drugs and biological substances: Secondary | ICD-10-CM

## 2016-06-27 DIAGNOSIS — Z0000 Encounter for general adult medical examination without abnormal findings: Secondary | ICD-10-CM | POA: Diagnosis not present

## 2016-06-27 DIAGNOSIS — Z125 Encounter for screening for malignant neoplasm of prostate: Secondary | ICD-10-CM | POA: Diagnosis not present

## 2016-06-27 DIAGNOSIS — R0600 Dyspnea, unspecified: Secondary | ICD-10-CM

## 2016-06-27 DIAGNOSIS — E119 Type 2 diabetes mellitus without complications: Secondary | ICD-10-CM | POA: Diagnosis not present

## 2016-06-27 LAB — PULMONARY FUNCTION TEST
FEF 25-75 PRE: 0.66 L/s
FEF 25-75 Post: 1.03 L/sec
FEF2575-%Change-Post: 56 %
FEF2575-%PRED-PRE: 37 %
FEF2575-%Pred-Post: 59 %
FEV1-%Change-Post: 15 %
FEV1-%Pred-Post: 75 %
FEV1-%Pred-Pre: 65 %
FEV1-Post: 1.86 L
FEV1-Pre: 1.61 L
FEV1FVC-%CHANGE-POST: 0 %
FEV1FVC-%Pred-Pre: 73 %
FEV6-%CHANGE-POST: 14 %
FEV6-%PRED-POST: 103 %
FEV6-%PRED-PRE: 90 %
FEV6-Post: 3.34 L
FEV6-Pre: 2.92 L
FEV6FVC-%CHANGE-POST: 0 %
FEV6FVC-%Pred-Post: 103 %
FEV6FVC-%Pred-Pre: 103 %
FVC-%Change-Post: 14 %
FVC-%PRED-POST: 99 %
FVC-%Pred-Pre: 87 %
FVC-POST: 3.47 L
FVC-PRE: 3.03 L
POST FEV6/FVC RATIO: 96 %
PRE FEV1/FVC RATIO: 53 %
Post FEV1/FVC ratio: 54 %
Pre FEV6/FVC Ratio: 96 %

## 2016-06-27 MED ORDER — BUDESONIDE-FORMOTEROL FUMARATE 160-4.5 MCG/ACT IN AERO: 2.0000 | INHALATION_SPRAY | Freq: Two times a day (BID) | RESPIRATORY_TRACT | 6 refills | Status: DC

## 2016-06-27 MED ORDER — MONTELUKAST SODIUM 10 MG PO TABS: 10.0000 mg | ORAL_TABLET | Freq: Every day | ORAL | 6 refills | Status: DC

## 2016-06-27 NOTE — Patient Instructions (Addendum)
   Continue using your Symbicort and Singulair as prescribed.  Call me if you have any new breathing problems or questions before your next appointment.

## 2016-06-27 NOTE — Addendum Note (Signed)
Addended by: Tyson Dense on: 06/27/2016 04:22 PM   Modules accepted: Orders

## 2016-06-27 NOTE — Progress Notes (Signed)
PFT done today. 

## 2016-06-27 NOTE — Progress Notes (Signed)
Subjective:    Patient ID: [name], male    DOB: 14-Mar-1941, 76 y.o.   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   HPI COPD mixed type: Switched from Qvar to Symbicort 160/4.5 at last appointment. Also started on Singulair at last appointment. He reports his breathing has been doing better. He reports rare, intermittent coughing. No wheezing. He denies any nocturnal awakenings with any coughing or wheezing. He reports rare need for his rescue medication. He is compliant but admits he is only using one inhalation at night.   Environmental allergies: Started on Singulair at last appointment. Denies any sinus congestion, pressure, watery eyes, etc.   Review of Systems No chest pain or pressure. No fever or chills. No abdominal pain or nausea.   No Known Allergies  Current Outpatient Prescriptions on File Prior to Visit  Medication Sig Dispense Refill  . albuterol (PROVENTIL HFA;VENTOLIN HFA) 108 (90 Base) MCG/ACT inhaler Inhale 2 puffs into the lungs every 6 (six) hours as needed for wheezing or shortness of breath. 1 Inhaler 3  . aspirin EC 81 MG tablet Take 81 mg by mouth at bedtime.    . budesonide-formoterol (SYMBICORT) 160-4.5 MCG/ACT inhaler Inhale 2 puffs into the lungs 2 (two) times daily. 1 Inhaler 3  . Cyanocobalamin (VITAMIN B-12 SL) Place 1 tablet under the tongue daily.    . folic acid (FOLVITE) 1 MG tablet Take 1 mg by mouth daily.    [name] lisinopril (PRINIVIL,ZESTRIL) 5 MG tablet Take 0.5 tablets (2.5 mg total) by mouth 2 (two) times daily. 60 tablet 2  . metFORMIN (GLUCOPHAGE) 500 MG tablet Take 500 mg by mouth 2 (two) times daily with a meal.     . montelukast (SINGULAIR) 10 MG tablet Take 1 tablet (10 mg total) by mouth at bedtime. 30 tablet 3  . Omega-3 Fatty Acids (FISH OIL PO) Take 1 capsule by mouth at bedtime.    . pantoprazole (PROTONIX) 40 MG tablet Take 1 tablet by mouth as directed.    . pravastatin (PRAVACHOL) 40 MG  tablet Take 1 tablet (40 mg total) by mouth daily. 90 tablet 3  . Spacer/Aero-Holding Chambers (AEROCHAMBER MV) inhaler Use as instructed 1 each 0  . beclomethasone (QVAR) 40 MCG/ACT inhaler Inhale 2 puffs into the lungs 2 (two) times daily.    . beclomethasone (QVAR) 80 MCG/ACT inhaler Inhale 1 puff into the lungs 2 (two) times daily. (Patient not taking: Reported on 06/27/2016) 1 Inhaler 0   No current facility-administered medications on file prior to visit.     Past Medical History:  Diagnosis Date  . Asthma   . CAD (coronary artery disease)    cath & CABG in 2008  . COPD (chronic obstructive pulmonary disease) ([place])   . Diabetes ([place])    type 2  . Dyslipidemia   . History of nuclear stress test 10/08/2007   bruce myoview; normal scan with attenuation artifact; no ischemia/infarct; low risk   . Prostate cancer ([place])   . S/P CABG (coronary artery bypass graft) 2008   post-op AF with no recurrence     Past Surgical History:  Procedure Laterality Date  . CARDIAC CATHETERIZATION  2003   total occlusion of LAD with left to left collaterals, mod disease of RCA & mild disease in Cfx (Dr. Rockne Menghini)  . Carotid Doppler  06/2010   stenosis of ICA less than 50%   . CORONARY ARTERY BYPASS GRAFT  05/31/2006  LIMA to LAD, SVG to OM1 & OM2, SVG to PDA (Dr. Prescott Gum)  . NASAL SEPTOPLASTY W/ TURBINOPLASTY  1970s  . PROSTATE BIOPSY    . TONSILLECTOMY      Family History  Problem Relation Age of Onset  . Diabetes Mother     died of CVA at 35, also HTN  . Alzheimer's disease Father   . CAD Father   . Heart attack Father 18    also HTN  . Stroke Maternal Grandmother   . Lung disease Neg Hx     Social History   Social History  . Marital status: Married    Spouse name: N/A  . Number of children: N/A  . Years of education: N/A   Social History Main Topics  . Smoking status: Former Smoker    Packs/day: 1.00    Years: 43.00    Types: Cigarettes    Start date: 12/25/1963    Quit  date: 01/31/2007  . Smokeless tobacco: Never Used  . Alcohol use 0.0 oz/week     Comment: few drinks a month  . Drug use: No  . Sexual activity: Not Asked   Other Topics Concern  . None   Social History Narrative   Originally from [place], [place]. Moved to [place] in 1968. 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 He grew up in [place], [place]. Currently has a dog. No bird or mold exposure.       Objective:   Physical Exam BP 110/80 (BP Location: Right Arm, Patient Position: Sitting, Cuff Size: Normal)   Pulse 78   Ht 5\' 6"  (1.676 m)   Wt 151 lb (68.5 kg)   SpO2 95%   BMI 24.37 kg/m   Gen.: Awake. No distress. Alert. Integument: No rash or bruising on exposed skin. Warm and dry. Pulmonary: Again clear with auscultation. No accessory muscle use on room air. Abdomen: Soft. Mildly protuberant. Normal bowel sounds. Cardiovascular: Regular rate. Regular rhythm. No edema.  PFT 06/27/16: FVC 3.03 L (87%) FEV1 1.61 L (65%) FEV1/FVC 0.53 FEF 25-75 0.66 L (37%) positive bronchodilator response 03/16/16: FVC 2.74 L (78%) FEV1 1.49 L (60%) FEV1/FVC 0.54 FEF 25-75 0.70 L (40%) positive bronchodilator response 12/22/15: FVC 3.23 L (92%) FEV1 1.47 L (58%) FEV1/FVC 0.45 FEF 25-75 0.49 L (26%)                                                     TLC 8.31 L (133%) RV 203% ERV 218% DLCO uncorrected 45%  6MWT 03/22/16:  Walked 438 meters / Baseline Sat 97% on RA / nadir Sat 97% on RA  LABS 02/03/16 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) RAST Panel:  Cat Dander 27.5 / D farinae 9.29 / Dog Dander 6.74 / Multiple Positives IgE:  689    Assessment & Plan:  76 y.o. male with COPD mixed type & multiple environmental allergies. Patient's spirometry has significantly improved since previous testing and with his improved symptom control I do feel that his current regimen is working quite well. With his continued significant bronchodilator response however I do feel that  continuing 2 inhalations twice daily is imperative. I do not feel that adjusting or increasing his treatment regimen for his multiple environmental allergies would be of great benefit at this  time. I instructed the patient contact my office if he had any new breathing problems or questions before his next appointment.  1. COPD mixed type: Continuing Symbicort 160/4.5. Patient instructed to return to using 2 puffs twice daily as prescribed. Consider de-escalating to Symbicort 80/4.5 at next appointment if symptom control continues. 2. Multiple environmental allergies: Continuing Singulair. No changes. 3. Health maintenance: Status post influenza vaccine October 2017 & previous Pneumovax. Prevnar administered at last appointment. 4. Follow-up: Return to clinic in 6 months or sooner if needed.  Sonia Baller Ashok Cordia, M.D. [facility] Pulmonary & Critical Care Pager:  [phone] After 3pm or if no response, call [phone] 4:08 PM 06/27/16

## 2016-07-02 DIAGNOSIS — Z0001 Encounter for general adult medical examination with abnormal findings: Secondary | ICD-10-CM | POA: Diagnosis not present

## 2016-08-12 DIAGNOSIS — L03116 Cellulitis of left lower limb: Secondary | ICD-10-CM | POA: Diagnosis not present

## 2016-08-16 DIAGNOSIS — E119 Type 2 diabetes mellitus without complications: Secondary | ICD-10-CM | POA: Diagnosis not present

## 2016-08-16 DIAGNOSIS — H524 Presbyopia: Secondary | ICD-10-CM | POA: Diagnosis not present

## 2016-08-20 DIAGNOSIS — L03119 Cellulitis of unspecified part of limb: Secondary | ICD-10-CM | POA: Diagnosis not present

## 2016-08-20 DIAGNOSIS — L821 Other seborrheic keratosis: Secondary | ICD-10-CM | POA: Diagnosis not present

## 2016-08-20 DIAGNOSIS — I10 Essential (primary) hypertension: Secondary | ICD-10-CM | POA: Diagnosis not present

## 2016-08-20 DIAGNOSIS — C44319 Basal cell carcinoma of skin of other parts of face: Secondary | ICD-10-CM | POA: Diagnosis not present

## 2016-08-20 DIAGNOSIS — D229 Melanocytic nevi, unspecified: Secondary | ICD-10-CM | POA: Diagnosis not present

## 2016-08-20 DIAGNOSIS — C4491 Basal cell carcinoma of skin, unspecified: Secondary | ICD-10-CM

## 2016-08-20 DIAGNOSIS — L570 Actinic keratosis: Secondary | ICD-10-CM | POA: Diagnosis not present

## 2016-08-20 HISTORY — DX: Basal cell carcinoma of skin, unspecified: C44.91

## 2016-09-14 ENCOUNTER — Ambulatory Visit (INDEPENDENT_AMBULATORY_CARE_PROVIDER_SITE_OTHER): Payer: Medicare Other | Admitting: Internal Medicine

## 2016-09-14 ENCOUNTER — Encounter: Payer: Self-pay | Admitting: Internal Medicine

## 2016-09-14 VITALS — BP 124/80 | HR 83 | Ht 66.0 in | Wt 147.6 lb

## 2016-09-14 DIAGNOSIS — E781 Pure hyperglyceridemia: Secondary | ICD-10-CM | POA: Diagnosis not present

## 2016-09-14 DIAGNOSIS — Z951 Presence of aortocoronary bypass graft: Secondary | ICD-10-CM

## 2016-09-14 DIAGNOSIS — J449 Chronic obstructive pulmonary disease, unspecified: Secondary | ICD-10-CM

## 2016-09-14 DIAGNOSIS — I10 Essential (primary) hypertension: Secondary | ICD-10-CM

## 2016-09-14 DIAGNOSIS — E785 Hyperlipidemia, unspecified: Secondary | ICD-10-CM

## 2016-09-14 NOTE — Progress Notes (Signed)
OFFICE NOTE  Chief Complaint:  No complaints  Primary Care Physician: [name], MD  HPI:  ONDRE SALVETTI is a pleasant 76 yo male with a history of CAD and emergent CABG in 2008 x 4 vessels (LIMA to LAD, sequential SVG to OM1 and OM 2, SVG to PDA) , complicated by transient postop atrial fibrillation. He has had no recurrence of any arrhythmias.  He has been maintained on 325 mg aspirin.  Any recurrence of chest pain. Recently was seen in Dr. Julianne Rice office after an episode of hypotension and bradycardia. He was noted to be pale and somewhat lethargic. His wife had noted that he had dark stools and there was concern for possible GI bleeding. He did have laboratory work which indicated an acute drop in hemoglobin and hematocrit. His H&H was 14 and 41 on 07/28/2012 and decreased to 10 and 31 on 01/29/2013.  Because he was having some confusion and dizziness apparently he underwent a CT scan of the head which was negative and a chest x-ray which was unremarkable. Stool Y. x-ray never performed. He was however referred to a GI doctor and has an upcoming appointment to his blood pressure medicine was decreased due to hypotension and is normotensive today. He still reports some fatigue but his symptoms have improved he denies any ongoing melena or hematochezia.  Mr. Bothwell returns today for follow-up. It turns out that he did have some gastric erosions which appears to have healed on a PPI. I reduced his aspirin 81 mg and he's had no further bleeding episodes. He denies any chest pain or shortness of breath.   I saw Mr. Chriscoe back in the office today. Overall he seems to be doing well and is asymptomatic. He had recent laboratory work which again shows a stable cholesterol with a total around 150 and LDL around 80. He is tolerating medications without any difficulty. He remains physically active without any shortness of breath or chest pain.  It is been 8 years since his bypass surgery and he has not  had reassessment of he is bypass graft since that time.  02/15/2016  Mr. Stfort returns today for follow-up. Overall he feels well. He denies any chest pain or worsening shortness of breath. He's had no recurrent A. fib. His blood pressure is well-controlled. Weight is within normal limits. He was switched from lisinopril to losartan 25 mg daily due to cough.  09/14/2016  Mr. Hardgrove is seen today in follow-up. Overall he seems to be doing well. He recently went on a walk with his wife few days ago about 45 minutes up and down hills with no worsening shortness of breath or chest pain. He's been working with Dr. Ashok Cordia with pulmonary for COPD has been on Symbicort. This is helped his breathing. Of note his last EF was 50-55% on echo last year. He denies any worsening shortness of breath or chest pain. Blood pressure is well-controlled today. He is overdue for a lipid profile which we will assess. He is not on a beta blocker due to his lung disease this point.  PMHx:  Past Medical History:  Diagnosis Date  . Asthma   . CAD (coronary artery disease)    cath & CABG in 2008  . COPD (chronic obstructive pulmonary disease) ([place])   . Diabetes ([place])    type 2  . Dyslipidemia   . History of nuclear stress test 10/08/2007   bruce myoview; normal scan with attenuation artifact; no ischemia/infarct; low  risk   . Prostate cancer ([place])   . S/P CABG (coronary artery bypass graft) 2008   post-op AF with no recurrence     Past Surgical History:  Procedure Laterality Date  . CARDIAC CATHETERIZATION  2003   total occlusion of LAD with left to left collaterals, mod disease of RCA & mild disease in Cfx (Dr. Rockne Menghini)  . Carotid Doppler  06/2010   stenosis of ICA less than 50%   . CORONARY ARTERY BYPASS GRAFT  05/31/2006   LIMA to LAD, SVG to OM1 & OM2, SVG to PDA (Dr. Prescott Gum)  . NASAL SEPTOPLASTY W/ TURBINOPLASTY  1970s  . PROSTATE BIOPSY    . TONSILLECTOMY      FAMHx:  Family History  Problem  Relation Age of Onset  . Diabetes Mother        died of CVA at 13, also HTN  . Alzheimer's disease Father   . CAD Father   . Heart attack Father 96       also HTN  . Stroke Maternal Grandmother   . Lung disease Neg Hx     SOCHx:   reports that he quit smoking about 9 years ago. His smoking use included Cigarettes. He started smoking about 52 years ago. He has a 43.00 pack-year smoking history. He has never used smokeless tobacco. He reports that he drinks alcohol. He reports that he does not use drugs.  ALLERGIES:  No Known Allergies  ROS: Pertinent items noted in HPI and remainder of comprehensive ROS otherwise negative.  HOME MEDS: Current Outpatient Prescriptions  Medication Sig Dispense Refill  . albuterol (PROVENTIL HFA;VENTOLIN HFA) 108 (90 Base) MCG/ACT inhaler Inhale 2 puffs into the lungs every 6 (six) hours as needed for wheezing or shortness of breath. 1 Inhaler 3  . aspirin EC 81 MG tablet Take 81 mg by mouth at bedtime.    . budesonide-formoterol (SYMBICORT) 160-4.5 MCG/ACT inhaler Inhale 2 puffs into the lungs 2 (two) times daily. 1 Inhaler 6  . Cyanocobalamin (VITAMIN B-12 SL) Place 1 tablet under the tongue daily.    . folic acid (FOLVITE) 1 MG tablet Take 1 mg by mouth daily.    [name] lisinopril (PRINIVIL,ZESTRIL) 5 MG tablet Take 0.5 tablets (2.5 mg total) by mouth 2 (two) times daily. 60 tablet 2  . metFORMIN (GLUCOPHAGE) 500 MG tablet Take 500 mg by mouth 2 (two) times daily with a meal.     . montelukast (SINGULAIR) 10 MG tablet Take 1 tablet (10 mg total) by mouth at bedtime. 30 tablet 6  . Omega-3 Fatty Acids (FISH OIL PO) Take 1 capsule by mouth at bedtime.    . pantoprazole (PROTONIX) 40 MG tablet Take 1 tablet by mouth as directed.    . pravastatin (PRAVACHOL) 40 MG tablet Take 1 tablet (40 mg total) by mouth daily. 90 tablet 3   No current facility-administered medications for this visit.     LABS/IMAGING: No results found for this or any previous visit  (from the past 48 hour(s)). No results found.  VITALS: BP 124/80   Pulse 83   Ht 5\' 6"  (1.676 m)   Wt 147 lb 9.6 oz (67 kg)   BMI 23.82 kg/m   EXAM: General appearance: alert and no distress Neck: no carotid bruit, no JVD and thyroid not enlarged, symmetric, no tenderness/mass/nodules Lungs: diminished breath sounds bilaterally Heart: regular rate and rhythm Abdomen: soft, non-tender; bowel sounds normal; no masses,  no organomegaly Extremities: extremities normal, atraumatic,  no cyanosis or edema Pulses: 2+ and symmetric Skin: Skin color, texture, turgor normal. No rashes or lesions Neurologic: Grossly normal Psych: Pleasant  EKG: Normal sinus rhythm at 83, nonspecific ST changes-personally reviewed  ASSESSMENT: 1. Coronary artery disease status post four-vessel CABG in 2008 2. Postoperative atrial fibrillation without recurrence 3. Diabetes type 2 4. Dyslipidemia 5.   History of GI bleeding 6.   COPD  PLAN: 1.   Mr. Tejera seems to be doing well without worsening shortness of breath or chest pain. He had four-vessel CABG in 2008. Recent EF was 50-55%, and 2017. He is due for repeat lipid profile which we'll order today. He is on Symbicort for COPD and sees Dr. Ashok Cordia in pulmonary. Blood pressure is well-controlled. We'll continue current medications plan to see him back annually or sooner as necessary.  Pixie Casino, MD, [facility] Attending Cardiologist [place] 09/14/2016, 2:22 PM

## 2016-09-14 NOTE — Patient Instructions (Signed)
Your physician recommends that you return for lab work fasting to check cholesterol   Your physician wants you to follow-up in: ONE YEAR with Dr. Hilty. You will receive a reminder letter in the mail two months in advance. If you don't receive a letter, please call our office to schedule the follow-up appointment.   

## 2016-09-20 DIAGNOSIS — C4491 Basal cell carcinoma of skin, unspecified: Secondary | ICD-10-CM

## 2016-09-20 DIAGNOSIS — C44319 Basal cell carcinoma of skin of other parts of face: Secondary | ICD-10-CM | POA: Diagnosis not present

## 2016-09-20 HISTORY — DX: Basal cell carcinoma of skin, unspecified: C44.91

## 2016-10-18 DIAGNOSIS — C61 Malignant neoplasm of prostate: Secondary | ICD-10-CM | POA: Diagnosis not present

## 2016-10-18 DIAGNOSIS — N402 Nodular prostate without lower urinary tract symptoms: Secondary | ICD-10-CM | POA: Diagnosis not present

## 2016-10-31 DIAGNOSIS — M21612 Bunion of left foot: Secondary | ICD-10-CM | POA: Diagnosis not present

## 2016-10-31 DIAGNOSIS — M79673 Pain in unspecified foot: Secondary | ICD-10-CM | POA: Diagnosis not present

## 2016-10-31 DIAGNOSIS — M21611 Bunion of right foot: Secondary | ICD-10-CM | POA: Diagnosis not present

## 2016-10-31 DIAGNOSIS — M65872 Other synovitis and tenosynovitis, left ankle and foot: Secondary | ICD-10-CM | POA: Diagnosis not present

## 2016-10-31 DIAGNOSIS — M67471 Ganglion, right ankle and foot: Secondary | ICD-10-CM | POA: Diagnosis not present

## 2016-11-07 DIAGNOSIS — M722 Plantar fascial fibromatosis: Secondary | ICD-10-CM | POA: Diagnosis not present

## 2016-11-07 DIAGNOSIS — M65872 Other synovitis and tenosynovitis, left ankle and foot: Secondary | ICD-10-CM | POA: Diagnosis not present

## 2016-11-07 DIAGNOSIS — M21612 Bunion of left foot: Secondary | ICD-10-CM | POA: Diagnosis not present

## 2016-12-18 ENCOUNTER — Ambulatory Visit
Admission: RE | Admit: 2016-12-18 | Discharge: 2016-12-18 | Disposition: A | Discharge status: Home / Self Care | Payer: Self-pay | Source: Ambulatory Visit | Attending: Pulmonary Disease | Admitting: Pulmonary Disease

## 2016-12-18 ENCOUNTER — Other Ambulatory Visit: Payer: Self-pay | Admitting: Pulmonary Disease

## 2016-12-18 DIAGNOSIS — J449 Chronic obstructive pulmonary disease, unspecified: Secondary | ICD-10-CM

## 2016-12-18 DIAGNOSIS — C44319 Basal cell carcinoma of skin of other parts of face: Secondary | ICD-10-CM | POA: Diagnosis not present

## 2016-12-25 ENCOUNTER — Ambulatory Visit (INDEPENDENT_AMBULATORY_CARE_PROVIDER_SITE_OTHER): Payer: Medicare Other | Admitting: Pulmonary Disease

## 2016-12-25 ENCOUNTER — Encounter: Payer: Self-pay | Admitting: Pulmonary Disease

## 2016-12-25 VITALS — BP 130/70 | HR 85 | Ht 66.0 in | Wt 147.4 lb

## 2016-12-25 DIAGNOSIS — J449 Chronic obstructive pulmonary disease, unspecified: Secondary | ICD-10-CM | POA: Diagnosis not present

## 2016-12-25 DIAGNOSIS — Z9109 Other allergy status, other than to drugs and biological substances: Secondary | ICD-10-CM

## 2016-12-25 MED ORDER — BUDESONIDE-FORMOTEROL FUMARATE 80-4.5 MCG/ACT IN AERO
2.0000 | INHALATION_SPRAY | Freq: Two times a day (BID) | RESPIRATORY_TRACT | 12 refills | Status: DC
Start: 2016-12-25 — End: 2017-07-01

## 2016-12-25 NOTE — Patient Instructions (Addendum)
   Continue using your Symbicort inhaler as you have been. But we are going to decrease the dose to 80/4.5 from 160/4.5.  Call my office if you feel you are having any increased cough or trouble breathing.  We will see you back in 6 months or sooner if needed.

## 2016-12-25 NOTE — Progress Notes (Signed)
Subjective:    Patient ID: [name], male    DOB: 07-19-1940, 76 y.o.   MRN: [id number]  C.C.:  Follow-up for COPD Mixed Type & Environmental Allergies.   HPI Mixed type COPD: Previously on Qvar. Patient instructed to use Symbicort 160/4.52 puffs twice daily at last appointment. Also on Singulair. He reports he has been doing 1-2 puffs once daily. He reports no exacerbations since last appointment. He has still noticed some mild cough with exertion & dyspnea on exertion depending on the weather. He denies any wheezing. Hasn't needed his rescue inhaler.   Environmental allergies: Multiple identified on serum testing. Previously prescribed Singulair. No sinus congestion, sinus drainage or itchy eyes. He is still taking Singulair.   Review of Systems No chest pain, pressure or tightness. No fever or chills. No abdominal pain or nausea.   No Known Allergies  Current Outpatient Prescriptions on File Prior to Visit  Medication Sig Dispense Refill  . albuterol (PROVENTIL HFA;VENTOLIN HFA) 108 (90 Base) MCG/ACT inhaler Inhale 2 puffs into the lungs every 6 (six) hours as needed for wheezing or shortness of breath. 1 Inhaler 3  . aspirin EC 81 MG tablet Take 81 mg by mouth at bedtime.    . budesonide-formoterol (SYMBICORT) 160-4.5 MCG/ACT inhaler Inhale 2 puffs into the lungs 2 (two) times daily. 1 Inhaler 6  . Cyanocobalamin (VITAMIN B-12 SL) Place 1 tablet under the tongue daily.    . folic acid (FOLVITE) 1 MG tablet Take 1 mg by mouth daily.    [name] lisinopril (PRINIVIL,ZESTRIL) 5 MG tablet Take 0.5 tablets (2.5 mg total) by mouth 2 (two) times daily. 60 tablet 2  . metFORMIN (GLUCOPHAGE) 500 MG tablet Take 500 mg by mouth 2 (two) times daily with a meal.     . montelukast (SINGULAIR) 10 MG tablet Take 1 tablet (10 mg total) by mouth at bedtime. 30 tablet 6  . Omega-3 Fatty Acids (FISH OIL PO) Take 1 capsule by mouth at bedtime.    . pantoprazole (PROTONIX) 40 MG tablet Take 1 tablet by  mouth as directed.    . pravastatin (PRAVACHOL) 40 MG tablet Take 1 tablet (40 mg total) by mouth daily. 90 tablet 3   No current facility-administered medications on file prior to visit.     Past Medical History:  Diagnosis Date  . Asthma   . CAD (coronary artery disease)    cath & CABG in 2008  . COPD (chronic obstructive pulmonary disease) ([place])   . Diabetes ([place])    type 2  . Dyslipidemia   . History of nuclear stress test 10/08/2007   bruce myoview; normal scan with attenuation artifact; no ischemia/infarct; low risk   . Prostate cancer ([place])   . S/P CABG (coronary artery bypass graft) 2008   post-op AF with no recurrence     Past Surgical History:  Procedure Laterality Date  . CARDIAC CATHETERIZATION  2003   total occlusion of LAD with left to left collaterals, mod disease of RCA & mild disease in Cfx (Dr. Rockne Menghini)  . Carotid Doppler  06/2010   stenosis of ICA less than 50%   . CORONARY ARTERY BYPASS GRAFT  05/31/2006   LIMA to LAD, SVG to OM1 & OM2, SVG to PDA (Dr. Prescott Gum)  . NASAL SEPTOPLASTY W/ TURBINOPLASTY  1970s  . PROSTATE BIOPSY    . TONSILLECTOMY      Family History  Problem Relation Age of Onset  . Diabetes Mother  died of CVA at 56, also HTN  . Alzheimer's disease Father   . CAD Father   . Heart attack Father 31       also HTN  . Stroke Maternal Grandmother   . Lung disease Neg Hx     Social History   Social History  . Marital status: Married    Spouse name: N/A  . Number of children: N/A  . Years of education: N/A   Social History Main Topics  . Smoking status: Former Smoker    Packs/day: 1.00    Years: 43.00    Types: Cigarettes    Start date: 12/25/1963    Quit date: 01/31/2007  . Smokeless tobacco: Never Used  . Alcohol use 0.0 oz/week     Comment: few drinks a month  . Drug use: No  . Sexual activity: Not Asked   Other Topics Concern  . None   Social History Narrative   Originally from [place], [place]. Moved to [place] in 1968.  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 He grew up in [place], [place]. Currently has a dog. No bird or mold exposure.       Objective:   Physical Exam BP 130/70 (BP Location: Right Arm, Cuff Size: Normal)   Pulse 85   Ht 5\' 6"  (1.676 m)   Wt 147 lb 6 oz (66.8 kg)   SpO2 96%   BMI 23.79 kg/m   General:  Awake. Calm. No distress. Integument:  Warm. Dry. No rash appreciated. Extremities:  No cyanosis or clubbing.  HEENT:  No scleral icterus. Moist membranes. No oral ulcers. Cardiovascular:  Regular rate. No edema. No appreciable JVD.  Pulmonary:  Clear bilaterally with auscultation. Normal work of breathing on room air. Abdomen: Soft. Normal bowel sounds. Minimally protuberant. Musculoskeletal:  Normal bulk and tone. No joint effusion appreciated. Neurological:  Cranial nerves 2-12 grossly in tact. No meningismus. Moving all 4 extremities equally.   PFT 06/27/16: FVC 3.03 L (87%) FEV1 1.61 L (65%) FEV1/FVC 0.53 FEF 25-75 0.66 L (37%) positive bronchodilator response 03/16/16: FVC 2.74 L (78%) FEV1 1.49 L (60%) FEV1/FVC 0.54 FEF 25-75 0.70 L (40%) positive bronchodilator response 12/22/15: FVC 3.23 L (92%) FEV1 1.47 L (58%) FEV1/FVC 0.45 FEF 25-75 0.49 L (26%)                                                     TLC 8.31 L (133%) RV 203% ERV 218% DLCO uncorrected 45%  6MWT 03/22/16:  Walked 438 meters / Baseline Sat 97% on RA / nadir Sat 97% on RA  LABS 02/03/16 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) RAST Panel:  Cat Dander 27.5 / D farinae 9.29 / Dog Dander 6.74 / Multiple Positives IgE:  689    Assessment & Plan:  76 y.o. male with underlying mixed type COPD and multiple environmental allergies. Patient's symptom control remains excellent with his current regimen. He has tapered his Symbicort inhaler himself. I believe it's reasonable to further de-escalate his inhaler regimen with regards to his inhaled corticosteroid dose. I instructed  the patient to monitor his symptoms closely and contact my office if there were any new breathing problems or questions before his next appointment.  1. COPD mixed type: Attempting to de-escalate to Symbicort 80/4.5. Patient  will continue using 1-2 puffs daily for now. 2. Multiple environmental allergies: Continuing Singulair. No changes. 3. Health maintenance: Status post Pneumovax April 2011 & Prevnar January 2018. Administering high-dose influenza vaccine today. 4. Follow-up: Return to clinic in 6 months or sooner if needed.  Sonia Baller Ashok Cordia, M.D. [facility] Pager:  [phone] After 3pm or if no response, call [phone] 1:40 PM 12/25/16

## 2016-12-27 ENCOUNTER — Telehealth: Payer: Self-pay | Admitting: Pulmonary Disease

## 2016-12-27 DIAGNOSIS — Z5181 Encounter for therapeutic drug level monitoring: Secondary | ICD-10-CM

## 2016-12-27 NOTE — Telephone Encounter (Signed)
Contacted patient's wife and explained the letter I received detailing the mistaken switch between Losartan and Singulair from the manufacturer. Also explained this can cause renal failure and low blood pressure. They are going to take the medication to his pharmacy to confirm that the Singulair/Montelukast is correct. I will ask staff to place an order for a BMP to be done at our office and they will come tomorrow to have it done.   Please order a serum BMP.  Thank you.

## 2016-12-27 NOTE — Telephone Encounter (Signed)
Lab ordered as requested.  Nothing further needed.

## 2016-12-28 ENCOUNTER — Other Ambulatory Visit (INDEPENDENT_AMBULATORY_CARE_PROVIDER_SITE_OTHER): Payer: Medicare Other

## 2016-12-28 DIAGNOSIS — Z5181 Encounter for therapeutic drug level monitoring: Secondary | ICD-10-CM | POA: Diagnosis not present

## 2016-12-28 LAB — BASIC METABOLIC PANEL
BUN: 16 mg/dL (ref 6–23)
CALCIUM: 8.9 mg/dL (ref 8.4–10.5)
CO2: 28 mEq/L (ref 19–32)
Chloride: 105 mEq/L (ref 96–112)
Creatinine, Ser: 0.75 mg/dL (ref 0.40–1.50)
GFR: 107.05 mL/min (ref >60.00)
GLUCOSE: 127 mg/dL — AB (ref 70–99)
Potassium: 4.6 mEq/L (ref 3.5–5.1)
Sodium: 140 mEq/L (ref 135–145)

## 2017-01-07 ENCOUNTER — Telehealth: Payer: Self-pay | Admitting: Pulmonary Disease

## 2017-01-07 NOTE — Telephone Encounter (Signed)
According to my last note he should still be taking Singulair.

## 2017-01-07 NOTE — Telephone Encounter (Signed)
Spoke with pt, he wanted to see if Dr. Ashok Cordia wanted him to discontinue Singular. It is up for refill and wants to know he should pick it up from the pharmacy. He doesn't remember what you told him and I could not locate any correspondence in your note regarding this.

## 2017-01-07 NOTE — Telephone Encounter (Signed)
Spoke with patient. He is aware of JN's recs. Nothing else needed at time of call.

## 2017-03-25 DIAGNOSIS — J00 Acute nasopharyngitis [common cold]: Secondary | ICD-10-CM | POA: Diagnosis not present

## 2017-04-01 ENCOUNTER — Other Ambulatory Visit: Payer: Self-pay

## 2017-04-01 MED ORDER — MONTELUKAST SODIUM 10 MG PO TABS: 10.0000 mg | ORAL_TABLET | Freq: Every day | ORAL | 6 refills | Status: DC

## 2017-05-06 DIAGNOSIS — H903 Sensorineural hearing loss, bilateral: Secondary | ICD-10-CM | POA: Diagnosis not present

## 2017-05-06 DIAGNOSIS — H9113 Presbycusis, bilateral: Secondary | ICD-10-CM | POA: Insufficient documentation

## 2017-05-06 HISTORY — DX: Presbycusis, bilateral: H91.13

## 2017-06-03 DIAGNOSIS — C61 Malignant neoplasm of prostate: Secondary | ICD-10-CM | POA: Diagnosis not present

## 2017-06-13 DIAGNOSIS — C61 Malignant neoplasm of prostate: Secondary | ICD-10-CM | POA: Diagnosis not present

## 2017-06-13 DIAGNOSIS — N402 Nodular prostate without lower urinary tract symptoms: Secondary | ICD-10-CM | POA: Diagnosis not present

## 2017-06-24 ENCOUNTER — Ambulatory Visit: Payer: Medicare Other | Admitting: Adult Health

## 2017-07-01 ENCOUNTER — Encounter: Payer: Self-pay | Admitting: Adult Health

## 2017-07-01 ENCOUNTER — Ambulatory Visit: Payer: Medicare Other | Admitting: Adult Health

## 2017-07-01 DIAGNOSIS — Z9109 Other allergy status, other than to drugs and biological substances: Secondary | ICD-10-CM

## 2017-07-01 DIAGNOSIS — J449 Chronic obstructive pulmonary disease, unspecified: Secondary | ICD-10-CM | POA: Diagnosis not present

## 2017-07-01 MED ORDER — BUDESONIDE-FORMOTEROL FUMARATE 80-4.5 MCG/ACT IN AERO: 2.0000 | INHALATION_SPRAY | Freq: Two times a day (BID) | RESPIRATORY_TRACT | 5 refills | Status: DC

## 2017-07-01 NOTE — Patient Instructions (Signed)
Continue on Symbicort 2 puffs Twice daily  , rinse after use. Continue on Singulair daily.  Follow up with Dr. [name]  In 6 months and As needed

## 2017-07-01 NOTE — Assessment & Plan Note (Signed)
Cont on current regimen  

## 2017-07-01 NOTE — Progress Notes (Signed)
_0  ID: [name], male    DOB: 09-08-1940, 77 y.o.   MRN: [id number]  Chief Complaint  Patient presents with  . Follow-up    COPD    Referring provider: [name], MD  HPI: 77 year old male former smoker followed for COPD and AR   TEST  PFT 06/27/16: FVC 3.03 L (87%) FEV1 1.61 L (65%) FEV1/FVC 0.53 FEF 25-75 0.66 L (37%) positive bronchodilator response 03/16/16: FVC 2.74 L (78%) FEV1 1.49 L (60%) FEV1/FVC 0.54 FEF 25-75 0.70 L (40%) positive bronchodilator response 12/22/15: FVC 3.23 L (92%) FEV1 1.47 L (58%) FEV1/FVC 0.45 FEF 25-75 0.49 L (26%)                                                     TLC 8.31 L (133%) RV 203% ERV 218% DLCO uncorrected 45%  6MWT 03/22/16:  Walked 438 meters / Baseline Sat 97% on RA / nadir Sat 97% on RA  LABS 02/03/16 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) RAST Panel:  Cat Dander 27.5 / D farinae 9.29 / Dog Dander 6.74 / Multiple Positives IgE:  689     07/01/2017 Follow up ; COPD  Presents for a six-month follow-up for COPD and allergic rhinitis. Patient remains on Symbicort twice daily.  Says overall breathing has been doing okay.  He denies any flare cough or wheezing.  He has allergic rhinitis with multiple  environmental allergies.  He is on Singulair daily.  Denies any increased nasal drainage.   PVX and Prevnar are utd.   Does yoga 3 x week.    No Known Allergies  Immunization History  Administered Date(s) Administered  . Influenza, High Dose Seasonal PF 01/03/2016, 12/17/2016  . Pneumococcal Conjugate-13 07/24/2011, 03/22/2016  . Pneumococcal Polysaccharide-23 07/07/2009  . Zoster Recombinat (Shingrix) 02/16/2017    Past Medical History:  Diagnosis Date  . Asthma   . CAD (coronary artery disease)    cath & CABG in 2008  . COPD (chronic obstructive pulmonary disease) ([place])   . Diabetes ([place])    type 2  . Dyslipidemia   . History of nuclear stress test 10/08/2007   bruce myoview; normal scan with attenuation  artifact; no ischemia/infarct; low risk   . Prostate cancer ([place])   . 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 Packs/day: 1.00  . Years: 43.00  . Pack years: 43.00  . Types: Cigarettes  . Start date: 12/25/1963  . Last attempt to quit: 01/31/2007  . Years since quitting: 10.4  Smokeless Tobacco Never Used   Counseling given: Not Answered   Outpatient Encounter Medications as of 07/01/2017  Medication Sig  . albuterol (PROVENTIL HFA;VENTOLIN HFA) 108 (90 Base) MCG/ACT inhaler Inhale 2 puffs into the lungs every 6 (six) hours as needed for wheezing or shortness of breath.  [name] aspirin EC 81 MG tablet Take 81 mg by mouth at bedtime.  . budesonide-formoterol (SYMBICORT) 80-4.5 MCG/ACT inhaler Inhale 2 puffs into the lungs 2 (two) times daily.  . Cyanocobalamin (VITAMIN B-12 SL) Place 1 tablet under the tongue daily.  . folic acid (FOLVITE) 1 MG tablet Take 1 mg by mouth daily.  [name] lisinopril (PRINIVIL,ZESTRIL) 5 MG tablet Take 0.5  tablets (2.5 mg total) by mouth 2 (two) times daily.  . metFORMIN (GLUCOPHAGE) 500 MG tablet Take 500 mg by mouth 2 (two) times daily with a meal.   . Omega-3 Fatty Acids (FISH OIL PO) Take 1 capsule by mouth at bedtime.  . pantoprazole (PROTONIX) 40 MG tablet Take 1 tablet by mouth as directed.  . pravastatin (PRAVACHOL) 40 MG tablet Take 1 tablet (40 mg total) by mouth daily.  . [DISCONTINUED] budesonide-formoterol (SYMBICORT) 80-4.5 MCG/ACT inhaler Inhale 2 puffs into the lungs 2 (two) times daily.  . montelukast (SINGULAIR) 10 MG tablet Take 1 tablet (10 mg total) by mouth at bedtime. (Patient not taking: Reported on 07/01/2017)   No facility-administered encounter medications on file as of 07/01/2017.      Review of Systems  Constitutional:   No  weight loss, night sweats,  Fevers, chills, fatigue, or  lassitude.  HEENT:   No  headaches,  Difficulty swallowing,  Tooth/dental problems, or  Sore throat,                No sneezing, itching, ear ache, nasal congestion, post nasal drip,   CV:  No chest pain,  Orthopnea, PND, swelling in lower extremities, anasarca, dizziness, palpitations, syncope.   GI  No heartburn, indigestion, abdominal pain, nausea, vomiting, diarrhea, change in bowel habits, loss of appetite, bloody stools.   Resp: No shortness of breath with exertion or at rest.  No excess mucus, no productive cough,  No non-productive cough,  No coughing up of blood.  No change in color of mucus.  No wheezing.  No chest wall deformity  Skin: no rash or lesions.  GU: no dysuria, change in color of urine, no urgency or frequency.  No flank pain, no hematuria   MS:  No joint pain or swelling.  No decreased range of motion.  No back pain.    Physical Exam  BP 104/68 (BP Location: Left Arm, Cuff Size: Normal)   Pulse 82   Ht _0  (1.676 m)   Wt 147 lb 9.6 oz (67 kg)   SpO2 95%   BMI 23.82 kg/m   GEN: A/Ox3; pleasant , NAD, well nourished    HEENT:  [place]/AT,  EACs-clear, TMs-wnl, NOSE-clear, THROAT-clear, no lesions, no postnasal drip or exudate noted.   NECK:  Supple w/ fair ROM; no JVD; normal carotid impulses w/o bruits; no thyromegaly or nodules palpated; no lymphadenopathy.    RESP  Clear  P & A; w/o, wheezes/ rales/ or rhonchi. no accessory muscle use, no dullness to percussion  CARD:  RRR, no m/r/g, no peripheral edema, pulses intact, no cyanosis or clubbing.  GI:   Soft & nt; nml bowel sounds; no organomegaly or masses detected.   Musco: Warm bil, no deformities or joint swelling noted.   Neuro: alert, no focal deficits noted.    Skin: Warm, no lesions or rashes    Lab Results:  CBC    Component Value Date/Time   WBC 8.0 07/10/2008 0855   RBC 4.42 07/10/2008 0855   HGB 14.6 09/16/2015 1749   HCT 43.0 09/16/2015 1749   PLT 299 07/10/2008 0855   MCV 89.0 07/10/2008 0855   MCHC  34.2 07/10/2008 0855   RDW 12.7 07/10/2008 0855   LYMPHSABS 2.0 07/04/2008 0358   MONOABS 0.7 07/04/2008 0358   EOSABS 0.3 07/04/2008 0358   BASOSABS 0.0 07/04/2008 0358    BMET    Component Value Date/Time   NA 140 12/28/2016 1014  K 4.6 12/28/2016 1014   CL 105 12/28/2016 1014   CO2 28 12/28/2016 1014   GLUCOSE 127 (H) 12/28/2016 1014   BUN 16 12/28/2016 1014   CREATININE 0.75 12/28/2016 1014   CALCIUM 8.9 12/28/2016 1014   GFRNONAA >60 07/10/2008 0855   GFRAA  07/10/2008 0855    >60        The eGFR has been calculated using the MDRD equation. This calculation has not been validated in all clinical situations. eGFR's persistently <60 mL/min signify possible Chronic Kidney Disease.    BNP No results found for: BNP  ProBNP No results found for: PROBNP  Imaging: No results found.   Assessment & Plan:   COPD mixed type ([place]) Controlled on current regimen   Plan  Patient Instructions  Continue on Symbicort 2 puffs Twice daily  , rinse after use. Continue on Singulair daily.  Follow up with Dr. [name]  In 6 months and As needed        Environmental allergies Cont on current regimen      Rexene Edison, NP 07/01/2017

## 2017-07-01 NOTE — Assessment & Plan Note (Signed)
Controlled on current regimen   Plan  Patient Instructions  Continue on Symbicort 2 puffs Twice daily  , rinse after use. Continue on Singulair daily.  Follow up with Dr. [name]  In 6 months and As needed

## 2017-07-03 DIAGNOSIS — I10 Essential (primary) hypertension: Secondary | ICD-10-CM | POA: Diagnosis not present

## 2017-07-03 DIAGNOSIS — Z125 Encounter for screening for malignant neoplasm of prostate: Secondary | ICD-10-CM | POA: Diagnosis not present

## 2017-07-03 DIAGNOSIS — E782 Mixed hyperlipidemia: Secondary | ICD-10-CM | POA: Diagnosis not present

## 2017-07-03 DIAGNOSIS — Z7982 Long term (current) use of aspirin: Secondary | ICD-10-CM | POA: Diagnosis not present

## 2017-07-08 DIAGNOSIS — I2510 Atherosclerotic heart disease of native coronary artery without angina pectoris: Secondary | ICD-10-CM | POA: Diagnosis not present

## 2017-07-08 DIAGNOSIS — Z0001 Encounter for general adult medical examination with abnormal findings: Secondary | ICD-10-CM | POA: Diagnosis not present

## 2017-07-08 DIAGNOSIS — R739 Hyperglycemia, unspecified: Secondary | ICD-10-CM | POA: Diagnosis not present

## 2017-07-08 DIAGNOSIS — E782 Mixed hyperlipidemia: Secondary | ICD-10-CM | POA: Diagnosis not present

## 2017-07-08 DIAGNOSIS — E119 Type 2 diabetes mellitus without complications: Secondary | ICD-10-CM | POA: Diagnosis not present

## 2017-07-08 DIAGNOSIS — I10 Essential (primary) hypertension: Secondary | ICD-10-CM | POA: Diagnosis not present

## 2017-07-09 ENCOUNTER — Telehealth: Payer: Self-pay | Admitting: Internal Medicine

## 2017-07-09 NOTE — Telephone Encounter (Signed)
Received records from [facility] on 07/09/17, Appt 08/19/17 @ 9:00AM. [place]

## 2017-08-19 ENCOUNTER — Encounter: Payer: Self-pay | Admitting: Internal Medicine

## 2017-08-19 ENCOUNTER — Ambulatory Visit: Payer: Medicare Other | Admitting: Internal Medicine

## 2017-08-19 VITALS — BP 116/62 | HR 66 | Ht 66.0 in | Wt 148.8 lb

## 2017-08-19 DIAGNOSIS — I10 Essential (primary) hypertension: Secondary | ICD-10-CM | POA: Diagnosis not present

## 2017-08-19 DIAGNOSIS — E785 Hyperlipidemia, unspecified: Secondary | ICD-10-CM | POA: Diagnosis not present

## 2017-08-19 DIAGNOSIS — Z951 Presence of aortocoronary bypass graft: Secondary | ICD-10-CM | POA: Diagnosis not present

## 2017-08-19 DIAGNOSIS — J449 Chronic obstructive pulmonary disease, unspecified: Secondary | ICD-10-CM | POA: Diagnosis not present

## 2017-08-19 NOTE — Progress Notes (Signed)
OFFICE NOTE  Chief Complaint:  I feel well  Primary Care Physician: [name], MD  HPI:  [name] is a pleasant 77 yo male with a history of CAD and emergent CABG in 2008 x 4 vessels (LIMA to LAD, sequential SVG to OM1 and OM 2, SVG to PDA) , complicated by transient postop atrial fibrillation. He has had no recurrence of any arrhythmias.  He has been maintained on 325 mg aspirin.  Any recurrence of chest pain. Recently was seen in Dr. Julianne Rice office after an episode of hypotension and bradycardia. He was noted to be pale and somewhat lethargic. His wife had noted that he had dark stools and there was concern for possible GI bleeding. He did have laboratory work which indicated an acute drop in hemoglobin and hematocrit. His H&H was 14 and 41 on 07/28/2012 and decreased to 10 and 31 on 01/29/2013.  Because he was having some confusion and dizziness apparently he underwent a CT scan of the head which was negative and a chest x-ray which was unremarkable. Stool Y. x-ray never performed. He was however referred to a GI doctor and has an upcoming appointment to his blood pressure medicine was decreased due to hypotension and is normotensive today. He still reports some fatigue but his symptoms have improved he denies any ongoing melena or hematochezia.  [name] returns today for follow-up. It turns out that he did have some gastric erosions which appears to have healed on a PPI. I reduced his aspirin 81 mg and he's had no further bleeding episodes. He denies any chest pain or shortness of breath.   I saw [name] back in the office today. Overall he seems to be doing well and is asymptomatic. He had recent laboratory work which again shows a stable cholesterol with a total around 150 and LDL around 80. He is tolerating medications without any difficulty. He remains physically active without any shortness of breath or chest pain.  It is been 8 years since his bypass surgery and he has not  had reassessment of he is bypass graft since that time.  02/15/2016  [name] returns today for follow-up. Overall he feels well. He denies any chest pain or worsening shortness of breath. He's had no recurrent A. fib. His blood pressure is well-controlled. Weight is within normal limits. He was switched from lisinopril to losartan 25 mg daily due to cough.  09/14/2016  [name] is seen today in follow-up. Overall he seems to be doing well. He recently went on a walk with his wife few days ago about 45 minutes up and down hills with no worsening shortness of breath or chest pain. He's been working with Dr. Ashok Cordia with pulmonary for COPD has been on Symbicort. This is helped his breathing. Of note his last EF was 50-55% on echo last year. He denies any worsening shortness of breath or chest pain. Blood pressure is well-controlled today. He is overdue for a lipid profile which we will assess. He is not on a beta blocker due to his lung disease this point.  08/19/2017  [name] returns today for follow-up.  He denies any chest pain or worsening shortness of breath.  He has noted some persistent cough.  He was seen by pulmonary in the past and is been on Symbicort.  He has a recently been having an increase in LDL cholesterol.  Over the past 3 years the LDL is risen from 66-78-79.  This may  be likely due to dietary changes.  He reports compliance with pravastatin.   PMHx:  Past Medical History:  Diagnosis Date  . Asthma   . CAD (coronary artery disease)    cath & CABG in 2008  . COPD (chronic obstructive pulmonary disease) ([place])   . Diabetes ([place])    type 2  . Dyslipidemia   . History of nuclear stress test 10/08/2007   bruce myoview; normal scan with attenuation artifact; no ischemia/infarct; low risk   . Prostate cancer ([place])   . S/P CABG (coronary artery bypass graft) 2008   post-op AF with no recurrence     Past Surgical History:  Procedure Laterality Date  . CARDIAC CATHETERIZATION   2003   total occlusion of LAD with left to left collaterals, mod disease of RCA & mild disease in Cfx (Dr. Rockne Menghini)  . Carotid Doppler  06/2010   stenosis of ICA less than 50%   . CORONARY ARTERY BYPASS GRAFT  05/31/2006   LIMA to LAD, SVG to OM1 & OM2, SVG to PDA (Dr. Prescott Gum)  . NASAL SEPTOPLASTY W/ TURBINOPLASTY  1970s  . PROSTATE BIOPSY    . TONSILLECTOMY      FAMHx:  Family History  Problem Relation Age of Onset  . Diabetes Mother        died of CVA at 59, also HTN  . Alzheimer's disease Father   . CAD Father   . Heart attack Father 61       also HTN  . Stroke Maternal Grandmother   . Lung disease Neg Hx     SOCHx:   reports that he quit smoking about 10 years ago. His smoking use included cigarettes. He started smoking about 53 years ago. He has a 43.00 pack-year smoking history. He has never used smokeless tobacco. He reports that he drinks alcohol. He reports that he does not use drugs.  ALLERGIES:  No Known Allergies  ROS: Pertinent items noted in HPI and remainder of comprehensive ROS otherwise negative.  HOME MEDS: Current Outpatient Medications  Medication Sig Dispense Refill  . albuterol (PROVENTIL HFA;VENTOLIN HFA) 108 (90 Base) MCG/ACT inhaler Inhale 2 puffs into the lungs every 6 (six) hours as needed for wheezing or shortness of breath. 1 Inhaler 3  . aspirin EC 81 MG tablet Take 81 mg by mouth at bedtime.    . budesonide-formoterol (SYMBICORT) 80-4.5 MCG/ACT inhaler Inhale 2 puffs into the lungs 2 (two) times daily. 1 Inhaler 5  . Cyanocobalamin (VITAMIN B-12 SL) Place 1 tablet under the tongue daily.    . folic acid (FOLVITE) 1 MG tablet Take 1 mg by mouth daily.    [name] losartan (COZAAR) 25 MG tablet Take 25 mg by mouth daily. as directed  1  . metFORMIN (GLUCOPHAGE) 500 MG tablet Take 500 mg by mouth 2 (two) times daily with a meal.     . montelukast (SINGULAIR) 10 MG tablet Take 1 tablet (10 mg total) by mouth at bedtime. 30 tablet 6  . Omega-3 Fatty  Acids (FISH OIL PO) Take 1 capsule by mouth at bedtime.    . pravastatin (PRAVACHOL) 40 MG tablet Take 1 tablet (40 mg total) by mouth daily. 90 tablet 3   No current facility-administered medications for this visit.     LABS/IMAGING: No results found for this or any previous visit (from the past 48 hour(s)). No results found.  VITALS: BP 116/62   Pulse 66   Ht 5\' 6"  (1.676 m)  Wt 148 lb 12.8 oz (67.5 kg)   BMI 24.02 kg/m   EXAM: General appearance: alert and no distress Neck: no carotid bruit, no JVD and thyroid not enlarged, symmetric, no tenderness/mass/nodules Lungs: diminished breath sounds bilaterally Heart: regular rate and rhythm Abdomen: soft, non-tender; bowel sounds normal; no masses,  no organomegaly Extremities: extremities normal, atraumatic, no cyanosis or edema Pulses: 2+ and symmetric Skin: Skin color, texture, turgor normal. No rashes or lesions Neurologic: Grossly normal Psych: Pleasant  EKG: Normal sinus rhythm at 66 personally reviewed  ASSESSMENT: 1. Coronary artery disease status post four-vessel CABG in 2008 2. Postoperative atrial fibrillation without recurrence 3. Diabetes type 2 4. Dyslipidemia 5.   History of GI bleeding 6.   COPD  PLAN: 1.   [name] continues to be without complaints.  He denies any recurrent palpitations.  His COPD may have been acting up recently with some persistent cough.  He was previously seen Dr. Ashok Cordia however he is left [place].  Hopefully he will follow-up with a new pulmonologist.  He sees his primary care provider twice a year.  His LDL has continued to creep up.  His goal is less than 70.  I discussed options including switching to high intensity statin or ezetimibe, but he wants to work on diet at this time.  He should have a recheck of his cholesterol in 6 months after aggressive dietary intervention.  Follow-up with me annually or sooner as necessary.  Pixie Casino, MD, [facility], [place] Director of the Advanced Lipid Disorders &  Cardiovascular Risk Reduction Clinic Diplomate of the American Board of Clinical Lipidology Attending Cardiologist  Direct Dial: [phone]  Fax: [phone]  Website:  [email or web address] 08/19/2017, 9:13 AM

## 2017-08-19 NOTE — Patient Instructions (Signed)
Your physician wants you to follow-up in: ONE YEAR with Dr. Hilty. You will receive a reminder letter in the mail two months in advance. If you don't receive a letter, please call our office to schedule the follow-up appointment.  

## 2017-10-20 ENCOUNTER — Other Ambulatory Visit: Payer: Self-pay | Admitting: Acute Care

## 2017-11-11 DIAGNOSIS — M7581 Other shoulder lesions, right shoulder: Secondary | ICD-10-CM | POA: Diagnosis not present

## 2017-11-11 DIAGNOSIS — E119 Type 2 diabetes mellitus without complications: Secondary | ICD-10-CM | POA: Diagnosis not present

## 2017-11-19 DIAGNOSIS — M10071 Idiopathic gout, right ankle and foot: Secondary | ICD-10-CM | POA: Diagnosis not present

## 2017-12-04 DIAGNOSIS — H00011 Hordeolum externum right upper eyelid: Secondary | ICD-10-CM | POA: Diagnosis not present

## 2017-12-06 DIAGNOSIS — Z23 Encounter for immunization: Secondary | ICD-10-CM | POA: Diagnosis not present

## 2017-12-13 DIAGNOSIS — H26491 Other secondary cataract, right eye: Secondary | ICD-10-CM | POA: Diagnosis not present

## 2017-12-13 DIAGNOSIS — H01021 Squamous blepharitis right upper eyelid: Secondary | ICD-10-CM | POA: Diagnosis not present

## 2017-12-13 DIAGNOSIS — H0011 Chalazion right upper eyelid: Secondary | ICD-10-CM | POA: Diagnosis not present

## 2017-12-13 DIAGNOSIS — H43813 Vitreous degeneration, bilateral: Secondary | ICD-10-CM | POA: Diagnosis not present

## 2017-12-13 DIAGNOSIS — H01022 Squamous blepharitis right lower eyelid: Secondary | ICD-10-CM | POA: Diagnosis not present

## 2017-12-23 DIAGNOSIS — D229 Melanocytic nevi, unspecified: Secondary | ICD-10-CM | POA: Diagnosis not present

## 2017-12-23 DIAGNOSIS — L409 Psoriasis, unspecified: Secondary | ICD-10-CM | POA: Diagnosis not present

## 2018-01-27 ENCOUNTER — Other Ambulatory Visit: Payer: Self-pay

## 2018-01-27 ENCOUNTER — Ambulatory Visit: Payer: Medicare Other | Admitting: Podiatry

## 2018-02-03 ENCOUNTER — Ambulatory Visit: Payer: Medicare Other | Admitting: Podiatry

## 2018-02-03 ENCOUNTER — Ambulatory Visit (INDEPENDENT_AMBULATORY_CARE_PROVIDER_SITE_OTHER): Payer: Medicare Other

## 2018-02-03 ENCOUNTER — Encounter: Payer: Self-pay | Admitting: Podiatry

## 2018-02-03 VITALS — BP 143/57 | HR 43

## 2018-02-03 DIAGNOSIS — M7751 Other enthesopathy of right foot: Secondary | ICD-10-CM | POA: Diagnosis not present

## 2018-02-03 DIAGNOSIS — M7750 Other enthesopathy of unspecified foot: Secondary | ICD-10-CM

## 2018-02-03 DIAGNOSIS — R234 Changes in skin texture: Secondary | ICD-10-CM | POA: Diagnosis not present

## 2018-02-03 DIAGNOSIS — M79671 Pain in right foot: Secondary | ICD-10-CM

## 2018-02-05 NOTE — Progress Notes (Signed)
   HPI: 77 year old male presenting today as a new patient with a chief complaint of plantar heel pain of the right foot that began 1-2 weeks ago. He states the pain intensifies with every step he takes. He has been using a heel pad to help alleviate the symptoms which helps slightly. Patient is here for further evaluation and treatment.   Past Medical History:  Diagnosis Date  . Asthma   . CAD (coronary artery disease)    cath & CABG in 2008  . COPD (chronic obstructive pulmonary disease) ([place])   . Diabetes ([place])    type 2  . Dyslipidemia   . History of nuclear stress test 10/08/2007   bruce myoview; normal scan with attenuation artifact; no ischemia/infarct; low risk   . Prostate cancer ([place])   . S/P CABG (coronary artery bypass graft) 2008   post-op AF with no recurrence      Physical Exam: General: The patient is alert and oriented x3 in no acute distress.  Dermatology: Right heel fissure noted. Skin is warm, dry and supple bilateral lower extremities.  Vascular: Palpable pedal pulses bilaterally. No edema or erythema noted. Capillary refill within normal limits.  Neurological: Epicritic and protective threshold grossly intact bilaterally.   Musculoskeletal Exam: Range of motion within normal limits to all pedal and ankle joints bilateral. Muscle strength 5/5 in all groups bilateral.   Radiographic Exam:  Normal osseous mineralization. Joint spaces preserved. No fracture/dislocation/boney destruction.    Assessment: 1. Heel fissure right heel    Plan of Care:  1. Patient evaluated. X-Rays reviewed.  2. Light debridement performed with tissue nipper.  3. Recommended antibiotic ointment daily with a bandage.  4. Recommended good foot lotion.  5. Return to clinic as needed.      Edrick Kins, DPM [facility]  Dr. Edrick Kins, DPM    [address], [place] [address]                Office [phone]  Fax [phone]

## 2018-03-17 DIAGNOSIS — E119 Type 2 diabetes mellitus without complications: Secondary | ICD-10-CM | POA: Diagnosis not present

## 2018-03-17 DIAGNOSIS — R1319 Other dysphagia: Secondary | ICD-10-CM | POA: Diagnosis not present

## 2018-03-27 DIAGNOSIS — J00 Acute nasopharyngitis [common cold]: Secondary | ICD-10-CM | POA: Diagnosis not present

## 2018-03-28 ENCOUNTER — Other Ambulatory Visit: Payer: Self-pay | Admitting: Gastroenterology

## 2018-03-28 DIAGNOSIS — R1311 Dysphagia, oral phase: Secondary | ICD-10-CM

## 2018-04-01 ENCOUNTER — Encounter: Payer: Self-pay | Admitting: Nurse Practitioner

## 2018-04-01 ENCOUNTER — Telehealth: Payer: Self-pay | Admitting: Nurse Practitioner

## 2018-04-01 ENCOUNTER — Ambulatory Visit (INDEPENDENT_AMBULATORY_CARE_PROVIDER_SITE_OTHER): Payer: Medicare Other | Admitting: Nurse Practitioner

## 2018-04-01 VITALS — BP 130/76 | HR 88 | Temp 97.8°F | Ht 66.0 in | Wt 145.6 lb

## 2018-04-01 DIAGNOSIS — J441 Chronic obstructive pulmonary disease with (acute) exacerbation: Secondary | ICD-10-CM

## 2018-04-01 MED ORDER — DOXYCYCLINE HYCLATE 100 MG PO TABS: 100.0000 mg | ORAL_TABLET | Freq: Two times a day (BID) | ORAL | 0 refills | Status: DC

## 2018-04-01 MED ORDER — PREDNISONE 10 MG PO TABS: ORAL_TABLET | ORAL | 0 refills | Status: DC

## 2018-04-01 NOTE — Patient Instructions (Addendum)
Will order doxycycline Will order prednisone taper May continue mucinex Continue Symbicort Continue Proventil as needed Follow up with Dr. [name] to establish care at his first available appointment

## 2018-04-01 NOTE — Telephone Encounter (Signed)
Called and spoke with patients wife, advised her of clarification on mucinex. Patient verbalized understanding. Nothing further needed.

## 2018-04-01 NOTE — Telephone Encounter (Signed)
Called and spoke with patients wife, she stated that the antibiotic and prednisone were sent to his mail order pharmacy and it needed to be sent to his local pharmacy. Apologized to patients wife and stated that I would get this sent over to the local pharmacy. Patients wife asked that I call the mail order pharmacy and cancel the order because they were not going to pay for it. Called mail order and asked that they cancel the order that was sent by mistake. Orders have been cancelled per Lorriane Shire who I spoke with. Called and made patients wife aware. Nothing further needed.

## 2018-04-01 NOTE — Progress Notes (Signed)
@Patient  ID: [name], male    DOB: [birth year], 78 y.o.   MRN: [id number]  Chief Complaint  Patient presents with  . Shortness of Breath    with productive cough    Referring provider: [name], MD  HPI  78 year old male former smoker with COPD formerly followed by Dr. Ashok Cordia.  Tests:  6MWT 03/22/16: Walked 438 meters / Baseline Sat 97% on RA / nadir Sat 97% on RA  LABS 02/03/16 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) RAST Panel: Cat Dander 27.5 / D farinae 9.29 / Dog Dander 6.74 / Multiple Positives IgE: 689  PFT:  PFT Results Latest Ref Rng & Units 06/27/2016 03/16/2016 11/28/2015  FVC-Pre L 3.03 2.74 3.23  FVC-Predicted Pre % 87 78 92  FVC-Post L 3.47 3.27 -  FVC-Predicted Post % 99 93 -  Pre FEV1/FVC % % 53 54 45  Post FEV1/FCV % % 54 53 -  FEV1-Pre L 1.61 1.49 1.47  FEV1-Predicted Pre % 65 60 58  FEV1-Post L 1.86 1.74 -  DLCO UNC% % - - 45  DLCO COR %Predicted % - - 53  TLC L - - 8.31  TLC % Predicted % - - 133  RV % Predicted % - - 203   OV 04/02/18 - cough and chest congestion Patient presents with cough and chest congestion.  He states that he has been having some intermittent shortness of breath.  He states that his cough is productive of yellow sputum.  He saw his PCP last Thursday and was told to take Mucinex.  He states that symptoms have progressively worsened.  He is compliant with Symbicort and Proventil.  He is taking Singulair.  He denies any fever.  Denies any chest pain or edema.  Vital signs are stable today.  He is afebrile today.   No Known Allergies  Immunization History  Administered Date(s) Administered  . Influenza, High Dose Seasonal PF 01/03/2016, 12/17/2016, 11/30/2017  . Pneumococcal Conjugate-13 07/24/2011, 03/22/2016, 03/08/2017  . Pneumococcal Polysaccharide-23 07/07/2009  . Zoster Recombinat (Shingrix) 02/16/2017    Past Medical History:  Diagnosis Date  . Asthma   . CAD (coronary artery disease)    cath & CABG in 2008   . COPD (chronic obstructive pulmonary disease) ([place])   . Diabetes ([place])    type 2  . Dyslipidemia   . History of nuclear stress test 10/08/2007   bruce myoview; normal scan with attenuation artifact; no ischemia/infarct; low risk   . Prostate cancer ([place])   . 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 Packs/day: 1.00  . Years: 43.00  . Pack years: 43.00  . Types: Cigarettes  . Start date: 12/25/1963  . Last attempt to quit: 01/31/2007  . Years since quitting: 11.1  Smokeless Tobacco Never Used   Counseling given: Yes   Outpatient Encounter Medications as of 04/01/2018  Medication Sig  . albuterol (PROVENTIL HFA;VENTOLIN HFA) 108 (90 Base) MCG/ACT inhaler Inhale 2 puffs into the lungs every 6 (six) hours as needed for wheezing or shortness of breath.  [name] aspirin EC 81 MG tablet Take 81 mg by mouth at bedtime.  . budesonide-formoterol (SYMBICORT) 80-4.5 MCG/ACT inhaler Inhale 2 puffs into the lungs 2 (two) times daily.  . Cyanocobalamin (VITAMIN B-12 SL) Place 1 tablet under the tongue daily.  [name] erythromycin ophthalmic ointment APPLY 1 INCH EXTERNALLY  QID X 5 DAYS  . folic acid (FOLVITE) 1 MG tablet Take 1 mg by mouth daily.  [name] lisinopril (PRINIVIL,ZESTRIL) 5 MG tablet Take one-half tablet by  mouth daily  . losartan (COZAAR) 25 MG tablet Take 25 mg by mouth daily. as directed  . metFORMIN (GLUCOPHAGE) 500 MG tablet Take 500 mg by mouth 2 (two) times daily with a meal.   . montelukast (SINGULAIR) 10 MG tablet TAKE 1 TABLET BY MOUTH AT BEDTIME  . Omega-3 Fatty Acids (FISH OIL PO) Take 1 capsule by mouth at bedtime.  . pantoprazole (PROTONIX) 40 MG tablet Take by mouth.  . pravastatin (PRAVACHOL) 40 MG tablet Take 1 tablet (40 mg total) by mouth daily.  . predniSONE (DELTASONE) 10 MG tablet   . ranitidine (ZANTAC) 300 MG tablet Take by mouth.  . [DISCONTINUED]  doxycycline (VIBRA-TABS) 100 MG tablet Take 1 tablet (100 mg total) by mouth 2 (two) times daily.  . [DISCONTINUED] predniSONE (DELTASONE) 10 MG tablet Take 4 tabs for 2 days, then 3 tabs for 2 days, then 2 tabs for 2 days, then 1 tab for 2 days, then stop   No facility-administered encounter medications on file as of 04/01/2018.      Review of Systems  Review of Systems  Constitutional: Negative.  Negative for chills and fever.  HENT: Negative.   Respiratory: Positive for cough and shortness of breath.   Cardiovascular: Negative.  Negative for chest pain, palpitations and leg swelling.  Gastrointestinal: Negative.   Allergic/Immunologic: Negative.   Neurological: Negative.   Psychiatric/Behavioral: Negative.        Physical Exam  BP 130/76 (BP Location: Left Arm, Patient Position: Sitting, Cuff Size: Normal)   Pulse 88   Temp 97.8 F (36.6 C)   Ht 5\' 6"  (1.676 m)   Wt 145 lb 9.6 oz (66 kg)   SpO2 95%   BMI 23.50 kg/m   Wt Readings from Last 5 Encounters:  04/01/18 145 lb 9.6 oz (66 kg)  08/19/17 148 lb 12.8 oz (67.5 kg)  07/01/17 147 lb 9.6 oz (67 kg)  12/25/16 147 lb 6 oz (66.8 kg)  09/14/16 147 lb 9.6 oz (67 kg)     Physical Exam Vitals signs and nursing note reviewed.  Constitutional:      General: He is not in acute distress.    Appearance: He is well-developed.  Cardiovascular:     Rate and Rhythm: Normal rate and regular rhythm.  Pulmonary:     Effort: Pulmonary effort is normal.     Breath sounds: Normal breath sounds. No decreased breath sounds, wheezing or rhonchi.  Skin:    General: Skin is warm and dry.  Neurological:     Mental Status: He is alert and oriented to person, place, and time.        Assessment & Plan:   COPD with acute exacerbation ([place]) Patient did not improve with mucinex. Will treat with antibiotic and prednisone.  Patient Instructions  Will order doxycycline Will order prednisone taper May continue mucinex Continue  Symbicort Continue Proventil as needed Follow up with Dr. [name] to establish care at his first available appointment     Fenton Foy, NP 04/02/2018

## 2018-04-02 ENCOUNTER — Ambulatory Visit
Admission: RE | Admit: 2018-04-02 | Discharge: 2018-04-02 | Disposition: A | Discharge status: Home / Self Care | Payer: Medicare Other | Source: Ambulatory Visit | Attending: Gastroenterology | Admitting: Gastroenterology

## 2018-04-02 ENCOUNTER — Encounter: Payer: Self-pay | Admitting: Nurse Practitioner

## 2018-04-02 DIAGNOSIS — E1310 Other specified diabetes mellitus with ketoacidosis without coma: Secondary | ICD-10-CM | POA: Diagnosis not present

## 2018-04-02 DIAGNOSIS — J441 Chronic obstructive pulmonary disease with (acute) exacerbation: Secondary | ICD-10-CM | POA: Insufficient documentation

## 2018-04-02 DIAGNOSIS — R1311 Dysphagia, oral phase: Secondary | ICD-10-CM

## 2018-04-02 DIAGNOSIS — J45901 Unspecified asthma with (acute) exacerbation: Secondary | ICD-10-CM | POA: Insufficient documentation

## 2018-04-02 HISTORY — DX: Chronic obstructive pulmonary disease with (acute) exacerbation: J44.1

## 2018-04-02 NOTE — Assessment & Plan Note (Signed)
Patient did not improve with mucinex. Will treat with antibiotic and prednisone.  Patient Instructions  Will order doxycycline Will order prednisone taper May continue mucinex Continue Symbicort Continue Proventil as needed Follow up with Dr. [name] to establish care at his first available appointment

## 2018-04-08 ENCOUNTER — Ambulatory Visit: Payer: Medicare Other | Admitting: Pulmonary Disease

## 2018-04-08 ENCOUNTER — Ambulatory Visit (INDEPENDENT_AMBULATORY_CARE_PROVIDER_SITE_OTHER)
Admission: RE | Admit: 2018-04-08 | Discharge: 2018-04-08 | Disposition: A | Discharge status: Home / Self Care | Payer: Medicare Other | Source: Ambulatory Visit | Attending: Pulmonary Disease | Admitting: Pulmonary Disease

## 2018-04-08 ENCOUNTER — Encounter: Payer: Self-pay | Admitting: Pulmonary Disease

## 2018-04-08 DIAGNOSIS — J441 Chronic obstructive pulmonary disease with (acute) exacerbation: Secondary | ICD-10-CM | POA: Diagnosis not present

## 2018-04-08 DIAGNOSIS — Z9109 Other allergy status, other than to drugs and biological substances: Secondary | ICD-10-CM

## 2018-04-08 DIAGNOSIS — R918 Other nonspecific abnormal finding of lung field: Secondary | ICD-10-CM | POA: Diagnosis not present

## 2018-04-08 NOTE — Progress Notes (Signed)
   Subjective:    Patient ID: [name], male    DOB: 06-06-1940, 78 y.o.   MRN: [id number]  HPI  78 year old ex-smoker with COPD, formally followed by Dr. Ashok Cordia, now presents to establish care with me. He is a retired Teacher, music for the news and record He was seen recently 04/01/2018 for COPD exacerbation and given doxycycline and prednisone  Chief Complaint  Patient presents with  . Follow-up    follows for acute cold     He feels much improved and is back to baseline. He is compliant with Symbicort.  He gets a winter cold every year that always takes an antibiotic and prednisone to improve. Review of his meds shows ARB, lisinopril was discontinued in the past.  He has a known history of CAD status post CABG and hyperlipidemia  He complained of dysphagia and a barium swallow test was done on 04/02/2018 which was normal  Significant tests/ events reviewed  PFT 06/27/16: FVC 3.03 L (87%) FEV1 1.61 L (65%) FEV1/FVC 0.53  positive bronchodilator response 03/16/16: FVC 2.74 L (78%) FEV1 1.49 L (60%) FEV1/FVC 0.54  positive bronchodilator response 12/22/15: FVC 3.23 L (92%) FEV1 1.47 L (58%) FEV1/FVC 0.45 TLC 8.31 L (133%) RV 203% ERV 218% DLCO uncorrected 45%   LABS 02/03/16 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) RAST Panel: Cat Dander 27.5 / D farinae 9.29 / Dog Dander 6.74 / Multiple Positives IgE: 689  Review of Systems Patient denies significant dyspnea,cough, hemoptysis,  chest pain, palpitations, pedal edema, orthopnea, paroxysmal nocturnal dyspnea, lightheadedness, nausea, vomiting, abdominal or  leg pains      Objective:   Physical Exam   Gen. Pleasant, well-nourished, in no distress ENT - no thrush, no pallor/icterus,no post nasal drip Neck: No JVD, no thyromegaly, no carotid bruits Lungs: no use of accessory muscles, no dullness to percussion, clear without rales or rhonchi  Cardiovascular: Rhythm regular, heart sounds  normal, no murmurs or gallops, no  peripheral edema Musculoskeletal: No deformities, no cyanosis or clubbing         Assessment & Plan:

## 2018-04-08 NOTE — Assessment & Plan Note (Signed)
Continue Singulair for now-can change this to use seasonally

## 2018-04-08 NOTE — Assessment & Plan Note (Signed)
Chest x-ray today. Stay on Symbicort 2 puffs twice daily, rinse mouth after use

## 2018-04-08 NOTE — Patient Instructions (Signed)
Chest x-ray today. Stay on Symbicort 2 puffs twice daily, rinse mouth after use

## 2018-04-10 ENCOUNTER — Other Ambulatory Visit: Payer: Self-pay

## 2018-04-10 ENCOUNTER — Ambulatory Visit: Payer: Medicare Other

## 2018-04-10 DIAGNOSIS — J189 Pneumonia, unspecified organism: Secondary | ICD-10-CM

## 2018-04-15 DIAGNOSIS — R1084 Generalized abdominal pain: Secondary | ICD-10-CM | POA: Diagnosis not present

## 2018-04-15 DIAGNOSIS — N200 Calculus of kidney: Secondary | ICD-10-CM | POA: Diagnosis not present

## 2018-04-16 ENCOUNTER — Other Ambulatory Visit: Payer: Medicare Other

## 2018-04-18 DIAGNOSIS — R109 Unspecified abdominal pain: Secondary | ICD-10-CM | POA: Diagnosis not present

## 2018-04-18 DIAGNOSIS — R1084 Generalized abdominal pain: Secondary | ICD-10-CM | POA: Diagnosis not present

## 2018-04-18 DIAGNOSIS — E782 Mixed hyperlipidemia: Secondary | ICD-10-CM | POA: Diagnosis not present

## 2018-04-18 DIAGNOSIS — N130 Hydronephrosis with ureteropelvic junction obstruction: Secondary | ICD-10-CM | POA: Diagnosis not present

## 2018-04-29 ENCOUNTER — Telehealth: Payer: Self-pay | Admitting: Pulmonary Disease

## 2018-04-29 NOTE — Telephone Encounter (Signed)
Called and left message for Patients Wife, Hassan Rowan, to call back.

## 2018-04-30 DIAGNOSIS — N1330 Unspecified hydronephrosis: Secondary | ICD-10-CM | POA: Diagnosis not present

## 2018-04-30 DIAGNOSIS — R1084 Generalized abdominal pain: Secondary | ICD-10-CM | POA: Diagnosis not present

## 2018-04-30 NOTE — Telephone Encounter (Signed)
Called patient, unable to reach. Left message to give us a call back.  

## 2018-04-30 NOTE — Telephone Encounter (Signed)
I advised patient that this can be used as a Scientist, research (medical) but he could go over any questions with BM at next ov on 05/14/18 Nothing further needed at this time.

## 2018-04-30 NOTE — Telephone Encounter (Signed)
Patient's wife Hassan Rowan returning phone call.  Patient phone number is [phone].

## 2018-05-13 NOTE — Progress Notes (Signed)
_0  ID: [name], male    DOB: [birth year], 78 y.o.   MRN: [id number]  Chief Complaint  Patient presents with  . 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  Quit 2008.  43-pack-year smoking history. Maintenance: Symbicort 80 Pt of: Dr. [name]  05/14/2018  - Visit   78 year old male former smoker presenting to our office today for a follow-up visit with a chest x-ray.  Patient was previously seen in our office on 04/09/2018 where chest x-ray was performed which showed a potential early pneumonia.  Patient had a follow-up chest x-ray prior to our office visit today.  Chest x-ray results are listed below:  05/14/2018- chest x-ray-persistent ill-defined right lung interstitial and airspace process possible persistent bronchopneumonia, chest CT baby may be more helpful for further evaluation  Patient reporting that since last office visit his cough is still present but has slightly improved.  Patient reports adherence to Symbicort 80 as well as Singulair.  Patient has not had to use his rescue inhaler at all since last office visit.  Patient with known environmental allergies.  Patient reports he does own a dog.  The dog does not sleep with him.  Patient with dust mite allergies.  Patient does not have dust mite covers.  Patient does not take a daily antihistamine.  Patient does not do nasal saline rinses.  MMRC - Breathlessness Score 2 - on level ground, I walk slower than people of the same age because of breathlessness, or have to stop for breathe when walking to my own pace   Tests:   04/09/2018-chest x-ray-ill-defined peripheral right midlung airspace opacity possibly early pneumonia,    >>>follow-up PA lateral chest x-ray is recommended in 3 to 4 weeks following trial of antibiotic therapy to ensure resolution and exclude underlying malignancy  05/14/2018- chest x-ray-persistent ill-defined right lung interstitial and airspace process possible persistent bronchopneumonia, chest CT baby may be more helpful for further evaluation  02/03/2016-respiratory allergy panel, environmental allergens to dust mites, cat dander, dog dander, Timothy grass, cockroach, fungus, cedar trees, ragweed, rough pigweed, mouse urine, IgE is 689 >>>Largest elevations are to cat dander, dog dander, dust mites  06/27/2016-pulmonary function test-FVC 3.03 (87% predicted), postbronchodilator ratio 54, postbronchodilator FEV1 of 1.86 (75% predicted), positive bronchodilator response  03/08/2016-echocardiogram-LV ejection fraction 50 to 96%, grade 1 diastolic dysfunction, PA P pressure 28  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)   FENO:  No results found for: NITRICOXIDE  PFT: PFT Results Latest Ref Rng & Units 06/27/2016 03/16/2016 11/28/2015  FVC-Pre L 3.03 2.74 3.23  FVC-Predicted Pre % 87 78 92  FVC-Post L 3.47 3.27 -  FVC-Predicted Post % 99 93 -  Pre FEV1/FVC % % 53 54 45  Post FEV1/FCV % % 54 53 -  FEV1-Pre L 1.61 1.49 1.47  FEV1-Predicted Pre % 65 60 58  FEV1-Post L 1.86 1.74 -  DLCO UNC% % - - 45  DLCO COR %Predicted % - - 53  TLC L - - 8.31  TLC % Predicted % - - 133  RV % Predicted % - - 203    Imaging: Dg Chest 2 View  Result Date: 05/14/2018 CLINICAL DATA:  Follow-up pneumonia. EXAM: CHEST - 2 VIEW COMPARISON:  Chest  x-ray 04/08/2018 FINDINGS: The cardiac silhouette, mediastinal and hilar contours are within normal limits and stable. There is mild tortuosity and calcification of the thoracic aorta. Stable surgical changes from bypass surgery. Persistent ill-defined streaky airspace opacity, interstitial thickening and mild nodularity in the right lower lobe. This could reflect  persistent bronchopneumonia. I do not see a discrete mass or hilar adenopathy. Given the chronicity, chest CT may be helpful for further evaluation. No definite pleural effusions. IMPRESSION: 1. Persistent ill-defined right lung interstitial and airspace process, possible persistent bronchopneumonia. Chest CT may be helpful for further evaluation. 2. Stable surgical changes from bypass surgery. Electronically Signed   By: Marijo Sanes M.D.   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 Influenza, High Dose Seasonal PF 01/03/2016, 12/17/2016, 11/30/2017  . Pneumococcal Conjugate-13 07/24/2011, 03/22/2016, 03/08/2017  . Pneumococcal Polysaccharide-23 07/07/2009  . Zoster Recombinat (Shingrix) 02/16/2017   Up-to-date  Past Medical History:  Diagnosis Date  . Asthma   . CAD (coronary artery disease)    cath & CABG in 2008  . COPD (chronic obstructive pulmonary disease) ([place])   . Diabetes ([place])    type 2  . Dyslipidemia   . History of nuclear stress test 10/08/2007   bruce myoview; normal scan with attenuation artifact; no ischemia/infarct; low risk   . Prostate cancer ([place])   . 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 Packs/day: 1.00  . Years: 43.00  . Pack years: 43.00  . Types: Cigarettes  . Start date: 12/25/1963  . Last attempt to quit: 01/31/2007  . Years since quitting: 11.2  Smokeless Tobacco Never Used   Counseling given: Yes  Continue to not smoke  Outpatient Encounter Medications as of 05/14/2018  Medication Sig  . albuterol (PROVENTIL HFA;VENTOLIN HFA) 108 (90 Base) MCG/ACT inhaler Inhale 2 puffs into the lungs every 6 (six) hours as needed for wheezing or shortness of breath.  [name] aspirin EC 81 MG tablet Take 81 mg by mouth at bedtime.  . budesonide-formoterol (SYMBICORT) 80-4.5 MCG/ACT inhaler Inhale 2 puffs into the lungs 2 (two) times daily.  . Cyanocobalamin (VITAMIN B-12 SL) Place 1 tablet under the tongue daily.  . folic acid (FOLVITE) 1 MG tablet Take 1 mg by mouth daily.  [name] losartan (COZAAR) 25 MG tablet Take 25 mg by mouth daily. as directed  . metFORMIN (GLUCOPHAGE) 500 MG tablet Take 500 mg by mouth 2 (two) times daily with a meal.   . montelukast (SINGULAIR) 10 MG tablet TAKE 1 TABLET BY MOUTH AT BEDTIME  . Omega-3 Fatty Acids (FISH OIL PO) Take 1 capsule by mouth at bedtime.  . pravastatin (PRAVACHOL) 40 MG tablet Take 1 tablet (40 mg total) by mouth daily.  . ranitidine (ZANTAC)  300 MG tablet Take by mouth.  . pantoprazole (PROTONIX) 40 MG tablet Take by mouth.  . predniSONE (DELTASONE) 10 MG tablet   . [DISCONTINUED] doxycycline (VIBRA-TABS) 100 MG tablet Take 1 tablet (100 mg total) by mouth 2 (two) times daily.  . [DISCONTINUED] erythromycin ophthalmic ointment APPLY 1 INCH EXTERNALLY QID X 5 DAYS   No facility-administered encounter medications on file as of 05/14/2018.      Review of Systems  Review of Systems  Constitutional: Negative for fatigue.  HENT: Negative for congestion, sinus pressure and sinus pain.   Respiratory: Positive for cough (improved cough, still productive with brown mucous). Negative for shortness of breath and wheezing.     Cardiovascular: Negative for chest pain and palpitations.  Gastrointestinal: Negative for diarrhea, nausea and vomiting.  Allergic/Immunologic: Positive for environmental allergies.     Physical Exam  BP 130/80 (BP Location: Left Arm, Cuff Size: Normal)   Pulse 77   Ht '5\' 6"'$  (1.676 m)   Wt 144 lb 4.8 oz (65.5 kg)   SpO2 98%   BMI 23.29 kg/m   Wt Readings from Last 5 Encounters:  05/14/18 144 lb 4.8 oz (65.5 kg)  04/08/18 146 lb 12.8 oz (66.6 kg)  04/01/18 145 lb 9.6 oz (66 kg)  08/19/17 148 lb 12.8 oz (67.5 kg)  07/01/17 147 lb 9.6 oz (67 kg)     Physical Exam  Constitutional: He is oriented to person, place, and time and well-developed, well-nourished, and in no distress. No distress.  HENT:  Head: Normocephalic and atraumatic.  Right Ear: Hearing, tympanic membrane, external ear and ear canal normal.  Left Ear: Hearing, tympanic membrane, external ear and ear canal normal.  Nose: Nose normal. Right sinus exhibits no maxillary sinus tenderness and no frontal sinus tenderness. Left sinus exhibits no maxillary sinus tenderness and no frontal sinus tenderness.  Mouth/Throat: Uvula is midline and oropharynx is clear and moist. No oropharyngeal exudate.  Eyes: Pupils are equal, round, and reactive to light.  Neck: Normal range of motion. Neck supple.  Cardiovascular: Normal rate, regular rhythm and normal heart sounds.  Pulmonary/Chest: Effort normal and breath sounds normal. No accessory muscle usage. No respiratory distress. He has no decreased breath sounds. He has no wheezes. He has no rhonchi. He has no rales.  Abdominal: Soft. Bowel sounds are normal. There is no abdominal tenderness.  Musculoskeletal: Normal range of motion.        General: No edema.  Lymphadenopathy:    He has no cervical adenopathy.  Neurological: He is alert and oriented to person, place, and time. Gait normal.  Skin: Skin is warm and dry. He is not diaphoretic. No erythema.  Psychiatric: Mood,  memory, affect and judgment normal.  Nursing note and vitals reviewed.     Lab Results:  CBC    Component Value Date/Time   WBC 8.0 07/10/2008 0855   RBC 4.42 07/10/2008 0855   HGB 14.6 09/16/2015 1749   HCT 43.0 09/16/2015 1749   PLT 299 07/10/2008 0855   MCV 89.0 07/10/2008 0855   MCHC 34.2 07/10/2008 0855   RDW 12.7 07/10/2008 0855   LYMPHSABS 2.0 07/04/2008 0358   MONOABS 0.7 07/04/2008 0358   EOSABS 0.3 07/04/2008 0358   BASOSABS 0.0 07/04/2008 0358    BMET    Component Value Date/Time   NA 140 12/28/2016 1014   K 4.6 12/28/2016 1014   CL 105 12/28/2016 1014   CO2 28 12/28/2016 1014   GLUCOSE 127 (H) 12/28/2016 1014  BUN 16 12/28/2016 1014   CREATININE 0.75 12/28/2016 1014   CALCIUM 8.9 12/28/2016 1014   GFRNONAA >60 07/10/2008 0855   GFRAA  07/10/2008 0855    >60        The eGFR has been calculated using the MDRD equation. This calculation has not been validated in all clinical situations. eGFR's persistently <60 mL/min signify possible Chronic Kidney Disease.    BNP No results found for: BNP  ProBNP No results found for: PROBNP    Assessment & Plan:    Asthma-COPD overlap syndrome (HCC) Assessment: Multiple elevations on RAST panel in 2017 Patient with known dog allergy, patient has dog at home but does not sleep with him Patient maintained on Symbicort 80 2 puffs twice daily Patient has not had to use rescue inhaler since last office visit Patient with positive bronchodilator response on 2018 pulmonary function test 43-pack-year smoking history, stopped in 2008 Lungs clear to auscultation today mMRC 2  Plan: Continue Symbicort 80 Chest x-ray today shows persistent opacity will further evaluate with CT Continue Singulair daily Could consider starting daily antihistamine as well as nasal nasal saline rinses for management of environmental allergies if needed Tentative follow-up with our office in 6 weeks Will move up follow-up based  off of CT results Continue to not smoke  Environmental allergies Assessment: Patient owns a dog Multiple known allergies hives elevations to dust mites, cat dander and dog dander, 2017 IgE of 689 Maintained on Symbicort 80 Maintained on daily Singulair Patient does not have dust mite covers Patient is not taking a daily antihistamine or doing nasal saline rinses  Plan: Continue to avoid known allergens Continue daily Singulair Can start driving pillows weekly to help with dust mites Can start dust mite covers for mattress as well as pillowcases Recommend vacuuming daily if dog is present in bedroom Can start daily antihistamine if having trouble maintaining allergies Can start nasal saline rinses for allergy management if needed   Former smoker Assessment: 43-pack-year smoking history Quit 2008 Currently still not smoking Repeat chest x-ray today shows ill-defined right lung interstitial air space process  Plan: We will further evaluate with a CT Continue to not smoke  Abnormal finding on lung imaging Assessment: 43-pack-year smoker Quit 2008 January/2020 chest x-ray shows ill-defined peripheral right midlung airspace opacity February/2020 chest x-ray shows persistent ill-defined peripheral right midlung airspace opacity despite antibiotic treatment  Plan: CT chest ordered     Lauraine Rinne, NP 05/14/2018   This appointment was 30 min long with over 50% of the time in direct face-to-face patient care, assessment, plan of care, and follow-up.

## 2018-05-14 ENCOUNTER — Ambulatory Visit (INDEPENDENT_AMBULATORY_CARE_PROVIDER_SITE_OTHER)
Admission: RE | Admit: 2018-05-14 | Discharge: 2018-05-14 | Disposition: A | Discharge status: Home / Self Care | Payer: Medicare Other | Source: Ambulatory Visit | Attending: Pulmonary Disease | Admitting: Pulmonary Disease

## 2018-05-14 ENCOUNTER — Ambulatory Visit: Payer: Medicare Other | Admitting: Pulmonary Disease

## 2018-05-14 ENCOUNTER — Encounter: Payer: Self-pay | Admitting: Pulmonary Disease

## 2018-05-14 VITALS — BP 130/80 | HR 77 | Ht 66.0 in | Wt 144.3 lb

## 2018-05-14 DIAGNOSIS — Z9109 Other allergy status, other than to drugs and biological substances: Secondary | ICD-10-CM

## 2018-05-14 DIAGNOSIS — R918 Other nonspecific abnormal finding of lung field: Secondary | ICD-10-CM | POA: Insufficient documentation

## 2018-05-14 DIAGNOSIS — Z87891 Personal history of nicotine dependence: Secondary | ICD-10-CM

## 2018-05-14 DIAGNOSIS — J189 Pneumonia, unspecified organism: Secondary | ICD-10-CM

## 2018-05-14 DIAGNOSIS — J449 Chronic obstructive pulmonary disease, unspecified: Secondary | ICD-10-CM | POA: Diagnosis not present

## 2018-05-14 HISTORY — DX: Personal history of nicotine dependence: Z87.891

## 2018-05-14 HISTORY — DX: Other nonspecific abnormal finding of lung field: R91.8

## 2018-05-14 NOTE — Assessment & Plan Note (Signed)
Assessment: 43-pack-year smoker Quit 2008 January/2020 chest x-ray shows ill-defined peripheral right midlung airspace opacity February/2020 chest x-ray shows persistent ill-defined peripheral right midlung airspace opacity despite antibiotic treatment  Plan: CT chest ordered

## 2018-05-14 NOTE — Assessment & Plan Note (Signed)
Assessment: 43-pack-year smoking history Quit 2008 Currently still not smoking Repeat chest x-ray today shows ill-defined right lung interstitial air space process  Plan: We will further evaluate with a CT Continue to not smoke

## 2018-05-14 NOTE — Patient Instructions (Addendum)
Chest x-ray results reviewed today >>>We will go ahead and order a CT without contrast >>>Please complete this within the next week  Proceed to patient care coordinators today and they will get you scheduled for the CT  Continue Symbicort 80 >>> 2 puffs in the morning right when you wake up, rinse out your mouth after use, 12 hours later 2 puffs, rinse after use >>> Take this daily, no matter what >>> This is not a rescue inhaler   Only use your albuterol as a rescue medication to be used if you can't catch your breath by resting or doing a relaxed purse lip breathing pattern.  - The less you use it, the better it will work when you need it. - Ok to use up to 2 puffs  every 4 hours if you must but call for immediate appointment if use goes up over your usual need - Don't leave home without it !!  (think of it like the spare tire for your car)   Continue daily Singulair  Can consider starting daily antihistamine if you start having nasal congestion or other allergy symptoms If you do decide to start a daily antihistamine please follow the instructions listed below: >>>choose one of: zyrtec, claritin, allegra, or xyzal  >>>these are over the counter medications  >>>can choose generic option  >>>take daily  >>>this medication helps with allergies, post nasal drip, and cough   Can also start nasal saline rinses if you start having nasal congestion or increased allergy symptoms  Allergy info:   Dust Mite Allergy  >>>levels of these increase in November / December  >>>this can heighten your bronchial hypersensitivity and increase chance of respiratory exacerbation  >>> Treatment options: Can get dust mite covers, dry pillows weekly and dryer, in case mattress with cover, cleaning carpets regularly if you have them  Local pollens: Tree pollen-seen more in spring Grass pollen-seen more than summertime Weeds-seen more in fall  Perennial allergens:  Dust mite, cockroach, mold indoor,  animal dander  Mold-seen more mid summer and fall   Follow-up with our office in 6 weeks >>>With Dr. [name] or Wyn Quaker NP >>> This appointment may be moved up based off the results of CT     It is flu season:   >>> Best ways to protect herself from the flu: Receive the yearly flu vaccine, practice good hand hygiene washing with soap and also using hand sanitizer when available, eat a nutritious meals, get adequate rest, hydrate appropriately   Please contact the office if your symptoms worsen or you have concerns that you are not improving.   Thank you for choosing [facility] for your healthcare, and for allowing [place] to partner with you on your healthcare journey. I am thankful to be able to provide care to you today.   Wyn Quaker FNP-C

## 2018-05-14 NOTE — Assessment & Plan Note (Signed)
Assessment: Patient owns a dog Multiple known allergies hives elevations to dust mites, cat dander and dog dander, 2017 IgE of 689 Maintained on Symbicort 80 Maintained on daily Singulair Patient does not have dust mite covers Patient is not taking a daily antihistamine or doing nasal saline rinses  Plan: Continue to avoid known allergens Continue daily Singulair Can start driving pillows weekly to help with dust mites Can start dust mite covers for mattress as well as pillowcases Recommend vacuuming daily if dog is present in bedroom Can start daily antihistamine if having trouble maintaining allergies Can start nasal saline rinses for allergy management if needed

## 2018-05-14 NOTE — Assessment & Plan Note (Signed)
Assessment: Multiple elevations on RAST panel in 2017 Patient with known dog allergy, patient has dog at home but does not sleep with him Patient maintained on Symbicort 80 2 puffs twice daily Patient has not had to use rescue inhaler since last office visit Patient with positive bronchodilator response on 2018 pulmonary function test 43-pack-year smoking history, stopped in 2008 Lungs clear to auscultation today mMRC 2  Plan: Continue Symbicort 80 Chest x-ray today shows persistent opacity will further evaluate with CT Continue Singulair daily Could consider starting daily antihistamine as well as nasal nasal saline rinses for management of environmental allergies if needed Tentative follow-up with our office in 6 weeks Will move up follow-up based off of CT results Continue to not smoke

## 2018-05-20 ENCOUNTER — Ambulatory Visit (INDEPENDENT_AMBULATORY_CARE_PROVIDER_SITE_OTHER)
Admission: RE | Admit: 2018-05-20 | Discharge: 2018-05-20 | Disposition: A | Discharge status: Home / Self Care | Payer: Medicare Other | Source: Ambulatory Visit | Attending: Pulmonary Disease | Admitting: Pulmonary Disease

## 2018-05-20 DIAGNOSIS — R918 Other nonspecific abnormal finding of lung field: Secondary | ICD-10-CM

## 2018-05-20 DIAGNOSIS — J189 Pneumonia, unspecified organism: Secondary | ICD-10-CM

## 2018-05-20 DIAGNOSIS — J181 Lobar pneumonia, unspecified organism: Secondary | ICD-10-CM | POA: Diagnosis not present

## 2018-05-22 ENCOUNTER — Encounter: Payer: Self-pay | Admitting: Pulmonary Disease

## 2018-05-22 ENCOUNTER — Telehealth: Payer: Self-pay | Admitting: Pulmonary Disease

## 2018-05-22 DIAGNOSIS — R918 Other nonspecific abnormal finding of lung field: Secondary | ICD-10-CM

## 2018-05-22 NOTE — Telephone Encounter (Signed)
Called and spoke with Patient's Wife, Hassan Rowan. Hassan Rowan asked to speak with Aaron Edelman, NP about CT results. Asked if I could help her?  She asked if Aaron Edelman, NP could call her at [phone].  Explained Aaron Edelman was seeing Patients this afternoon, and was unsure when he would contact her.  Hassan Rowan stated understanding.   Message routed to [facility]

## 2018-05-22 NOTE — Telephone Encounter (Signed)
05/22/2018 1708   Called and spoke to patients wife.  All questions were answered.  New Recommendations:  Please start taking a daily antihistamine:  >>>choose one of: zyrtec, claritin, allegra, or xyzal  >>>these are over the counter medications  >>>can choose generic option  >>>take daily  >>>this medication helps with allergies, post nasal drip, and cough   Nasal Salines rinses 1-2x a day as needed for congestion   Patient's wife reports that she will be at next office visit.  Can review CT me in more depth at that point in time.  Nothing further needed at this time.  Wyn Quaker FNP-C

## 2018-05-22 NOTE — Progress Notes (Signed)
See telephone note from 05/22/2018.  Called and discussed results with patient.  Will repeat CT in June/2020.  No further changes at this time.  Patient to keep scheduled follow-up with Dr. [name] in April/2020.  Wyn Quaker, FNP

## 2018-05-22 NOTE — Telephone Encounter (Signed)
05/22/2018 1105  Called and reached the patient discussed CT chest without contrast from 05/20/2018 results.  Discussed patient's current symptoms.  Patient reports that his cough has improved since his doxycycline treatment.  Patient reports he is doing okay.  He has occasional sneezing and allergy-like symptoms which is a known condition.  Patient also has occasional shortness of breath which she attributes to being his baseline.  Patient with known COPD asthma overlap syndrome.  Patient reports no worsening fatigue since last office visit and also no fevers.  Patient agrees with treatment plan to hold off on further antibiotic treatment at this time.  We will repeat CT of his chest in June/2020 the following ensure that this is clearing. Order for CT placed. Patient will keep scheduled follow-up appointment with Dr. [name] in April/2020.  Patient knows to follow-up with our office sooner if symptoms worsen or he has new onset of symptoms or questions or concerns.  Will route to Dr. [name] as Juluis Rainier.  Nothing further needed at this time.  Wyn Quaker, FNP

## 2018-05-30 ENCOUNTER — Telehealth: Payer: Self-pay | Admitting: Pulmonary Disease

## 2018-05-30 NOTE — Telephone Encounter (Signed)
Called and spoke with the wife I advised her that they should avoid large crowds and practice hand hygiene. An to call if there is any symptoms come up.

## 2018-06-06 ENCOUNTER — Other Ambulatory Visit: Payer: Self-pay | Admitting: Adult Health

## 2018-06-19 ENCOUNTER — Telehealth: Payer: Self-pay | Admitting: Pulmonary Disease

## 2018-06-19 NOTE — Telephone Encounter (Signed)
Returned call to spouse. Made aware code was sent. Code was sent to pt cell # She verified # and says they were walking dog but would attempt to activate when they got back home. Informed if any trouble call office back. Nothing further needed.

## 2018-06-23 NOTE — Progress Notes (Signed)
Virtual Visit via Telephone Note  I connected with [name] on 06/24/18 at 10:30 AM EDT by telephone and verified that I am speaking with the correct person using two identifiers.   I discussed the limitations, risks, security and privacy concerns of performing an evaluation and management service by telephone and the availability of in person appointments. I also discussed with the patient that there may be a patient responsible charge related to this service. The patient expressed understanding and agreed to proceed.  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  Quit 2008.  43-pack-year smoking history. Maintenance: Symbicort 80 Pt of: Dr. [name]  Patient consented to consult via telephone: Yes People present and their role in pt care: Pt and Pt Spouse   Chief complaint: Abnormal CT, CAOS  78 year old male former smoker followed in our office for asthma COPD overlap syndrome reports that his breathing has been going really well since last office visit.  He did complete a CT that showed a potential pneumonia or cryptogenic organizing pneumonia.  Which we are following with a June/2020 CT.  Patient was also treated with doxycycline and prednisone at the time of the illness.  Denies any sort of fevers, body aches, chills, changes in diet or fatigue.  He reports he does have an occasional cough that is typically productive with small amounts of sputum that is yellow.  He attributes this to his allergies.  See below 2017 RAST panel shows significant environmental allergies.  Patient is maintained on Singulair as well as Zyrtec.  He  is not doing nasal saline rinses    Observations/Objective:  04/09/2018-chest x-ray-ill-defined peripheral right midlung airspace opacity possibly early pneumonia,  >>>follow-up PA lateral chest x-ray is recommended in 3 to 4 weeks following trial of antibiotic therapy to ensure resolution and exclude underlying malignancy  05/14/2018- chest x-ray-persistent ill-defined right lung interstitial and airspace process possible persistent bronchopneumonia, chest CT baby may be more helpful for further evaluation  05/20/2018-CT chest without contrast- peribronchovascular nodule likely areas of consolidation as well as bronchial wall thickening and peribronchial vascular reticular and hazy airspace opacities most evident in the right lower lobe which may reflect multifocal bronchopneumonia pattern also raises suspicion for cryptogenic organizing pneumonia, 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  02/03/2016-respiratory allergy panel, environmental allergens to dust mites, cat dander, dog dander, Timothy grass, cockroach, fungus, cedar trees, ragweed, rough pigweed, mouse urine, IgE is 689 >>>Largest elevations are to cat dander, dog dander, dust mites  06/27/2016-pulmonary function test-FVC 3.03 (87% predicted), postbronchodilator ratio 54, postbronchodilator FEV1 of 1.86 (75% predicted), positive bronchodilator response  03/08/2016-echocardiogram-LV ejection fraction 50 to 50%, grade 1 diastolic dysfunction, PAP pressure 28  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)  No results found for: NITRICOXIDE  Assessment and Plan:  Asthma-COPD overlap syndrome (HCC) Assessment: Multiple elevations on RAST panel 2017 Patient has known dog allergy with dog at home Maintained on Symbicort 80 taken 2 puffs twice daily Has not used rescue inhaler since last office visit April/2018 pulmonary function test shows a positive bronchodilator response Patient maintained on Singulair as well as Zyrtec 43-pack-year smoking  history March/2020 CT shows 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 [name] at that time and it was felt that since the patient was feeling clinically better there is no additional treatment needed  Plan: June/2020 CT of chest  Environmental allergies Assessment: Multiple known allergies on 2017 RAST panel Patient has allergy to dog dander, patient has dog at home Patient maintained on Zyrtec daily Patient maintained on Singulair daily Patient maintained on Symbicort 80  Plan: Continue Singulair daily Continue Symbicort 80 Continue Zyrtec Start nasal saline rinses twice daily Continue to vacuum house often Do not allow dog to sleep with you     Follow Up Instructions:  Return in about 2 months (around 08/24/2018), or if symptoms worsen or fail to improve, for Follow up with Dr. [name].    I discussed the assessment and treatment plan with the patient. The patient was provided an opportunity to ask questions and all were answered. The patient agreed with the plan and demonstrated an understanding of the instructions.   The patient was advised to call back or seek an in-person evaluation if the symptoms worsen or if the condition fails to improve as anticipated.  I provided 25 minutes of non-face-to-face time during this encounter.   Lauraine Rinne, NP

## 2018-06-24 ENCOUNTER — Other Ambulatory Visit: Payer: Self-pay

## 2018-06-24 ENCOUNTER — Encounter: Payer: Self-pay | Admitting: Pulmonary Disease

## 2018-06-24 ENCOUNTER — Ambulatory Visit (INDEPENDENT_AMBULATORY_CARE_PROVIDER_SITE_OTHER): Payer: Medicare Other | Admitting: Pulmonary Disease

## 2018-06-24 DIAGNOSIS — Z9109 Other allergy status, other than to drugs and biological substances: Secondary | ICD-10-CM | POA: Diagnosis not present

## 2018-06-24 DIAGNOSIS — J449 Chronic obstructive pulmonary disease, unspecified: Secondary | ICD-10-CM

## 2018-06-24 DIAGNOSIS — R918 Other nonspecific abnormal finding of lung field: Secondary | ICD-10-CM | POA: Diagnosis not present

## 2018-06-24 NOTE — Patient Instructions (Addendum)
Continue forward with planned CT chest in June/2020  Continue Symbicort 80 >>> 2 puffs in the morning right when you wake up, rinse out your mouth after use, 12 hours later 2 puffs, rinse after use >>> Take this daily, no matter what >>> This is not a rescue inhaler   Only use your albuterol as a rescue medication to be used if you can't catch your breath by resting or doing a relaxed purse lip breathing pattern.  - The less you use it, the better it will work when you need it. - Ok to use up to 2 puffs every 4 hours if you must but call for immediate appointment if use goes up over your usual need - Don't leave home without it !! (think of it like the spare tire for your car)   Continue daily Singulair  Continue daily zyrtec   Start nasal saline rinses twice daily   Allergy info:   Dust Mite Allergy  >>>levels of these increase in November / December  >>>this can heighten your bronchial hypersensitivity and increase chance of respiratory exacerbation  >>> Treatment options: Can get dust mite covers, dry pillows weekly and dryer, in case mattress with cover, cleaning carpets regularly if you have them  Local pollens: Tree pollen-seen more in spring Grass pollen-seen more than summertime Weeds-seen more in fall  Perennial allergens:  Dust mite, cockroach, mold indoor, animal dander  Mold-seen more mid summer and fall   Return in about 2 months (around 08/24/2018), or if symptoms worsen or fail to improve, for Follow up with Dr. [name].   Coronavirus (COVID-19) Are you at risk?  Are you at risk for the Coronavirus (COVID-19)?  To be considered HIGH RISK for Coronavirus (COVID-19), you have to meet the following criteria:  . Traveled to [place], [place], [place], [place] or [place]; or in the [place] to [place], [place], [place], or [place]; and have fever, cough, and shortness of breath within the last 2 weeks of travel OR . Been in close contact with a  person diagnosed with COVID-19 within the last 2 weeks and have fever, cough, and shortness of breath . IF YOU DO NOT MEET THESE CRITERIA, YOU ARE CONSIDERED LOW RISK FOR COVID-19.  What to do if you are HIGH RISK for COVID-19?  [name] If you are having a medical emergency, call 911. . Seek medical care right away. Before you go to a doctor's office, urgent care or emergency department, call ahead and tell them about your recent travel, contact with someone diagnosed with COVID-19, and your symptoms. You should receive instructions from your physician's office regarding next steps of care.  . When you arrive at healthcare provider, tell the healthcare staff immediately you have returned from visiting [place], [place], [place], [place] or [place]; or traveled in the [place] to [place], [place], [place], or [place]; in the last two weeks or you have been in close contact with a person diagnosed with COVID-19 in the last 2 weeks.   . Tell the health care staff about your symptoms: fever, cough and shortness of breath. . After you have been seen by a medical provider, you will be either: o Tested for (COVID-19) and discharged home on quarantine except to seek medical care if symptoms worsen, and asked to  - Stay home and avoid contact with others until you get your results (4-5 days)  - Avoid travel on public transportation if possible (such as bus, train,  or airplane) or o Sent to the Emergency Department by EMS for evaluation, COVID-19 testing, and possible admission depending on your condition and test results.  What to do if you are LOW RISK for COVID-19?  Reduce your risk of any infection by using the same precautions used for avoiding the common cold or flu:  [name] Wash your hands often with soap and warm water for at least 20 seconds.  If soap and water are not readily available, use an alcohol-based hand sanitizer with at least 60% alcohol.  . If coughing or sneezing, cover your mouth and  nose by coughing or sneezing into the elbow areas of your shirt or coat, into a tissue or into your sleeve (not your hands). . Avoid shaking hands with others and consider head nods or verbal greetings only. . Avoid touching your eyes, nose, or mouth with unwashed hands.  . Avoid close contact with people who are sick. . Avoid places or events with large numbers of people in one location, like concerts or sporting events. . Carefully consider travel plans you have or are making. . If you are planning any travel outside or inside the [place], visit the CDC's Travelers' Health webpage for the latest health notices. . If you have some symptoms but not all symptoms, continue to monitor at home and seek medical attention if your symptoms worsen. . If you are having a medical emergency, call 911.   [place] / e-Visit: [email or web address]         [facility]: [facility]: [phone]                   [facility]: [phone]           It is flu season:   >>> Best ways to protect herself from the flu: Receive the yearly flu vaccine, practice good hand hygiene washing with soap and also using hand sanitizer when available, eat a nutritious meals, get adequate rest, hydrate appropriately   Please contact the office if your symptoms worsen or you have concerns that you are not improving.   Thank you for choosing [facility] for your healthcare, and for allowing [place] to partner with you on your healthcare journey. I am thankful to be able to provide care to you today.   Wyn Quaker FNP-C

## 2018-06-24 NOTE — Assessment & Plan Note (Signed)
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 [name] at that time and it was felt that since the patient was feeling clinically better there is no additional treatment needed  Plan: June/2020 CT of chest

## 2018-06-24 NOTE — Assessment & Plan Note (Signed)
Assessment: Multiple known allergies on 2017 RAST panel Patient has allergy to dog dander, patient has dog at home Patient maintained on Zyrtec daily Patient maintained on Singulair daily Patient maintained on Symbicort 80  Plan: Continue Singulair daily Continue Symbicort 80 Continue Zyrtec Start nasal saline rinses twice daily Continue to vacuum house often Do not allow dog to sleep with you

## 2018-06-24 NOTE — Assessment & Plan Note (Signed)
Assessment: Multiple elevations on RAST panel 2017 Patient has known dog allergy with dog at home Maintained on Symbicort 80 taken 2 puffs twice daily Has not used rescue inhaler since last office visit April/2018 pulmonary function test shows a positive bronchodilator response Patient maintained on Singulair as well as Zyrtec 43-pack-year smoking history March/2020 CT shows moderate to severe emphysema Patient denies wheezing or increased shortness of breath  Plan: Continue Symbicort 80 Continue rescue inhaler as needed Continue Singulair daily Continue Zyrtec daily Start nasal saline rinses twice daily Follow-up CT of chest in June/2020 Follow-up with Dr. [name] in 2 months

## 2018-06-25 ENCOUNTER — Ambulatory Visit: Payer: Medicare Other | Admitting: Pulmonary Disease

## 2018-07-22 ENCOUNTER — Telehealth: Payer: Self-pay | Admitting: Pulmonary Disease

## 2018-07-22 NOTE — Telephone Encounter (Signed)
Spoke with patient. He stated that he had seen on the news that [facility] was now offering testing for covid-19 at [facility]. He wanted to know if he should get tested. He does not have any symptoms at the moment but stated he is over age 78 and does have a lot of health problems. I advised patient that this testing was for people who have symptoms, he verbalized understanding but wanted to know Aaron Edelman thinks.   Aaron Edelman, please advise. Thanks!

## 2018-07-22 NOTE — Telephone Encounter (Signed)
If asymptomatic and stable then I do not recommend testing. If fevers develop, flu like symptoms, worsened shortness of breath, or exposure to COVID19 positive patients then we will need to consider testing.   Aaron Edelman

## 2018-07-22 NOTE — Telephone Encounter (Signed)
LVMTCB X 1 for patient.

## 2018-07-26 ENCOUNTER — Other Ambulatory Visit: Payer: Self-pay | Admitting: Adult Health

## 2018-08-04 ENCOUNTER — Telehealth: Payer: Self-pay | Admitting: *Deleted

## 2018-08-04 NOTE — Telephone Encounter (Signed)
A message was left to give our office a call.

## 2018-08-20 ENCOUNTER — Telehealth: Payer: Self-pay | Admitting: *Deleted

## 2018-08-20 NOTE — Telephone Encounter (Signed)

## 2018-08-22 ENCOUNTER — Ambulatory Visit (INDEPENDENT_AMBULATORY_CARE_PROVIDER_SITE_OTHER)
Admission: RE | Admit: 2018-08-22 | Discharge: 2018-08-22 | Disposition: A | Discharge status: Home / Self Care | Payer: Medicare Other | Source: Ambulatory Visit | Attending: Pulmonary Disease | Admitting: Pulmonary Disease

## 2018-08-22 ENCOUNTER — Other Ambulatory Visit: Payer: Self-pay

## 2018-08-22 DIAGNOSIS — R918 Other nonspecific abnormal finding of lung field: Secondary | ICD-10-CM

## 2018-08-22 DIAGNOSIS — J432 Centrilobular emphysema: Secondary | ICD-10-CM | POA: Diagnosis not present

## 2018-08-22 NOTE — Progress Notes (Signed)
CT scan results have come back.  In comparison to her March/2020 CT this is improved.  Right upper lobe density is previously seen in March has improved and is now likely just residual scarring.  No suspicious pulmonary nodules.  This is great news.  Can keep follow-up with our office as scheduled.  No further changes at this time.  Wyn Quaker, FNP

## 2018-09-01 ENCOUNTER — Ambulatory Visit: Payer: Medicare Other | Admitting: Pulmonary Disease

## 2018-09-01 DIAGNOSIS — M25511 Pain in right shoulder: Secondary | ICD-10-CM | POA: Diagnosis not present

## 2018-09-01 DIAGNOSIS — M7541 Impingement syndrome of right shoulder: Secondary | ICD-10-CM | POA: Diagnosis not present

## 2018-09-07 NOTE — Progress Notes (Signed)
@Patient  ID: [name], male    DOB: Mar 11, 1941, 78 y.o.   MRN: [id number]  Chief Complaint  Patient presents with  . 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  Quit 2008.  43-pack-year smoking history. Maintenance: Symbicort 80 Pt of: Dr. [name]   09/08/2018  - Visit   78 year old male former smoker followed in our office for asthma COPD overlap syndrome.  Patient presenting to our office today to discuss June/2020 CT chest results.  Patient was having a CT chest completed to follow his March/2020 CT chest results.  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  He feels clinically significantly improved.  He has not been having any worsened allergy symptoms.  He continues to be maintained on all of his medications at this time.  He reports no issues with his inhalers.   Tests:   04/09/2018-chest x-ray-ill-defined peripheral right midlung airspace opacity possibly early pneumonia,  >>>follow-up PA lateral chest x-ray is recommended in 3 to 4 weeks following trial of antibiotic therapy to ensure resolution and exclude underlying malignancy   05/14/2018- chest x-ray-persistent ill-defined right lung interstitial and airspace process possible persistent bronchopneumonia, chest CT may be more helpful for further evaluation  05/20/2018-CT chest without contrast- peribronchovascular nodule likely areas of consolidation as well as bronchial wall thickening and peribronchial vascular reticular and hazy airspace opacities most evident in the right lower lobe which may reflect multifocal bronchopneumonia pattern also raises suspicion for cryptogenic organizing pneumonia, 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  08/22/2018-CT chest without contrast- previously seen density in the inferior right upper lobe has improved, now platelike most compatible with residual scarring  02/03/2016-respiratory allergy panel, environmental allergens to dust mites, cat dander, dog dander, Timothy grass, cockroach, fungus, cedar trees, ragweed, rough pigweed, mouse urine, IgE is 689 >>>Largest elevations are to cat dander, dog dander, dust mites  06/27/2016-pulmonary function test-FVC 3.03 (87% predicted), postbronchodilator ratio 54, postbronchodilator FEV1 of 1.86 (75% predicted), positive bronchodilator response  03/08/2016-echocardiogram-LV ejection fraction 50 to 37%, grade 1 diastolic dysfunction, PAP pressure 28  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)  FENO:  No results found for: NITRICOXIDE  PFT: PFT Results Latest Ref Rng & Units 06/27/2016 03/16/2016 11/28/2015  FVC-Pre L 3.03 2.74 3.23  FVC-Predicted Pre % 87 78 92  FVC-Post L 3.47 3.27 -  FVC-Predicted Post % 99 93 -  Pre FEV1/FVC % % 53 54 45  Post FEV1/FCV % % 54 53 -  FEV1-Pre L 1.61 1.49 1.47  FEV1-Predicted Pre % 65 60 58  FEV1-Post L 1.86 1.74 -  DLCO UNC% % - - 45  DLCO COR %Predicted % - - 53  TLC L - - 8.31  TLC % Predicted % - - 133  RV % Predicted % - - 203    Imaging:  Ct Chest Wo Contrast  Result Date: 08/22/2018 CLINICAL DATA:  Productive cough EXAM: CT CHEST WITHOUT CONTRAST  TECHNIQUE: Multidetector CT imaging of the chest was performed following the standard protocol without IV contrast. COMPARISON:  05/20/2018 FINDINGS: Cardiovascular: Prior CABG. Heart is normal size. Aortic atherosclerosis. No aneurysm. Mediastinum/Nodes: Small scattered mediastinal lymph nodes, none pathologically enlarged. No mediastinal, hilar, or axillary adenopathy. Lungs/Pleura: Moderate centrilobular emphysema. Platelike scarring in the inferior right upper lobe is less pronounced than prior study. No suspicious pulmonary nodules, confluent opacities or effusions. Upper Abdomen: Imaging into the upper abdomen shows no acute findings. Musculoskeletal: No acute bony abnormality. Chest wall soft tissues are unremarkable. IMPRESSION: Previously seen density in the inferior right upper lobe has improved, and now platelike most compatible with residual scarring. No acute cardiopulmonary disease. Aortic Atherosclerosis (ICD10-I70.0) and Emphysema (ICD10-J43.9). Electronically Signed   By: Rolm Baptise M.D.   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)  06/27/2016-pulmonary function test-FVC 3.03 (87% predicted), postbronchodilator ratio 54, postbronchodilator FEV1 of 1.86 (75% predicted), positive bronchodilator   05/20/2018-CT chest without contrast- peribronchovascular nodule likely areas of consolidation as well as bronchial wall thickening and peribronchial vascular reticular and hazy airspace opacities most evident in the right lower lobe which may reflect multifocal bronchopneumonia pattern also raises suspicion for cryptogenic organizing  pneumonia, 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       COPD with acute exacerbation ([place])      No Known Allergies  Immunization History  Administered Date(s) Administered  . Influenza, High Dose Seasonal PF 01/03/2016, 12/17/2016, 11/30/2017  . Pneumococcal Conjugate-13 07/24/2011, 03/22/2016, 03/08/2017  . Pneumococcal Polysaccharide-23 07/07/2009  . Zoster Recombinat (Shingrix) 02/16/2017    Past Medical History:  Diagnosis Date  . Asthma   . CAD (coronary artery disease)    cath & CABG in 2008  . COPD (chronic obstructive pulmonary disease) ([place])   . Diabetes ([place])    type 2  . Dyslipidemia   . History of nuclear stress test 10/08/2007   bruce myoview; normal scan with attenuation artifact; no ischemia/infarct; low risk   . Prostate cancer ([place])   . 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 Packs/day: 1.00  . Years: 43.00  . Pack years: 43.00  . Types: Cigarettes  . Start date: 12/25/1963  . Quit date: 01/31/2007  . Years since quitting: 11.6  Smokeless Tobacco Never Used   Counseling given: Yes   Continue to not smoke  Outpatient Encounter Medications as of 09/08/2018  Medication Sig  . albuterol (PROVENTIL HFA;VENTOLIN HFA) 108 (90 Base) MCG/ACT inhaler Inhale 2 puffs into the lungs every 6 (six) hours as needed for wheezing or shortness of breath.  [name] aspirin EC 81 MG tablet Take 81 mg by mouth at bedtime.  . Cyanocobalamin (VITAMIN B-12 SL) Place 1 tablet under the tongue daily.  . folic acid (FOLVITE) 1 MG tablet Take 1 mg by mouth daily.  [name] losartan (COZAAR) 25 MG tablet Take 25 mg by mouth daily. as directed  .  metFORMIN (GLUCOPHAGE) 500 MG tablet Take 500 mg by mouth 2 (two) times daily with a meal.   . montelukast (SINGULAIR) 10 MG tablet TAKE 1 TABLET BY MOUTH AT BEDTIME  . Omega-3 Fatty Acids (FISH OIL PO) Take 1 capsule by mouth at  bedtime.  . pantoprazole (PROTONIX) 40 MG tablet Take by mouth.  . pravastatin (PRAVACHOL) 40 MG tablet Take 1 tablet (40 mg total) by mouth daily.  . predniSONE (DELTASONE) 10 MG tablet   . ranitidine (ZANTAC) 300 MG tablet Take by mouth.  . SYMBICORT 80-4.5 MCG/ACT inhaler INHALE 2 PUFFS INTO THE LUNGS TWICE DAILY   No facility-administered encounter medications on file as of 09/08/2018.      Review of Systems  Review of Systems  Constitutional: Negative for activity change, chills, fatigue, fever and unexpected weight change.  HENT: Negative for postnasal drip, rhinorrhea, sinus pressure, sinus pain, sneezing and sore throat.   Eyes: Negative.   Respiratory: Positive for cough (occasional productive cough with clear mucous ). Negative for shortness of breath and wheezing.   Cardiovascular: Negative for chest pain and palpitations.  Gastrointestinal: Negative for constipation, diarrhea, nausea and vomiting.  Endocrine: Negative.   Musculoskeletal: Negative.   Skin: Negative.   Neurological: Negative for dizziness and headaches.  Psychiatric/Behavioral: Negative.  Negative for dysphoric mood. The patient is not nervous/anxious.   All other systems reviewed and are negative.    Physical Exam  BP 126/70 (BP Location: Left Arm, Patient Position: Sitting, Cuff Size: Normal)   Pulse 79   Temp 97.6 F (36.4 C)   Ht 5' 5"  (1.651 m)   Wt 141 lb 9.6 oz (64.2 kg)   SpO2 97%   BMI 23.56 kg/m   Wt Readings from Last 5 Encounters:  09/08/18 141 lb 9.6 oz (64.2 kg)  05/14/18 144 lb 4.8 oz (65.5 kg)  04/08/18 146 lb 12.8 oz (66.6 kg)  04/01/18 145 lb 9.6 oz (66 kg)  08/19/17 148 lb 12.8 oz (67.5 kg)     Physical Exam  Constitutional: He is oriented to person, place, and time and well-developed, well-nourished, and in no distress. No distress.  HENT:  Head: Normocephalic and atraumatic.  Right Ear: Hearing, tympanic membrane, external ear and ear canal normal.  Left Ear:  Hearing, tympanic membrane, external ear and ear canal normal.  Nose: Nose normal. Left sinus exhibits no maxillary sinus tenderness.  Eyes: Pupils are equal, round, and reactive to light.  Neck: Normal range of motion. Neck supple.  Cardiovascular: Normal rate, regular rhythm and normal heart sounds.  Pulmonary/Chest: Effort normal. No accessory muscle usage. No respiratory distress. He has no decreased breath sounds. He has no wheezes. He has no rhonchi.  Abdominal: Soft. Bowel sounds are normal. He exhibits no distension. There is no abdominal tenderness.  Musculoskeletal: Normal range of motion.        General: No edema.  Lymphadenopathy:    He has no cervical adenopathy.  Neurological: He is alert and oriented to person, place, and time. Gait normal.  Skin: Skin is warm and dry. He is not diaphoretic. No erythema.  Psychiatric: Mood, memory, affect and judgment normal.  Nursing note and vitals reviewed.     Lab Results:  CBC    Component Value Date/Time   WBC 8.0 07/10/2008 0855   RBC 4.42 07/10/2008 0855   HGB 14.6 09/16/2015 1749   HCT 43.0 09/16/2015 1749   PLT 299 07/10/2008 0855   MCV 89.0 07/10/2008 0855   MCHC 34.2  07/10/2008 0855   RDW 12.7 07/10/2008 0855   LYMPHSABS 2.0 07/04/2008 0358   MONOABS 0.7 07/04/2008 0358   EOSABS 0.3 07/04/2008 0358   BASOSABS 0.0 07/04/2008 0358    BMET    Component Value Date/Time   NA 140 12/28/2016 1014   K 4.6 12/28/2016 1014   CL 105 12/28/2016 1014   CO2 28 12/28/2016 1014   GLUCOSE 127 (H) 12/28/2016 1014   BUN 16 12/28/2016 1014   CREATININE 0.75 12/28/2016 1014   CALCIUM 8.9 12/28/2016 1014   GFRNONAA >60 07/10/2008 0855   GFRAA  07/10/2008 0855    >60        The eGFR has been calculated using the MDRD equation. This calculation has not been validated in all clinical situations. eGFR's persistently <60 mL/min signify possible Chronic Kidney Disease.    BNP No results found for: BNP  ProBNP No results  found for: PROBNP    Assessment & Plan:   Asthma-COPD overlap syndrome (HCC) Assessment: Multiple elevations on RAST panel in 2017 Patient has known dog allergy with dog at home Maintained on Symbicort 80 Has not needed rescue inhaler since last office visit April/2018 pulmonary function test shows positive bronchodilator response Patient is maintained on Singulair as well as Zyrtec 43-pack-year smoking history March/2020 CT chest shows moderate to severe emphysema June/2020 CT chest shows moderate to severe emphysema, improved right upper lobe opacity Patient denies wheezing or increased shortness of breath  Plan: Continue Symbicort 80, rescue inhaler, Singulair, Zyrtec, nasal saline rinses Follow-up with our office in 4 months  Abnormal finding on lung imaging Assessment: March/2020 CT chest without contrast shows potential multifocal bronchopneumonia pattern or cryptic genic organizing pneumonia, 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 [name], Follow up with Wyn Quaker FNP-C.   Lauraine Rinne, NP 09/08/2018   This appointment was 26 minutes long with over 50% of the time in direct face-to-face patient care, assessment, plan of care, and follow-up.

## 2018-09-08 ENCOUNTER — Other Ambulatory Visit: Payer: Self-pay

## 2018-09-08 ENCOUNTER — Encounter: Payer: Self-pay | Admitting: Pulmonary Disease

## 2018-09-08 ENCOUNTER — Ambulatory Visit: Payer: Medicare Other | Admitting: Pulmonary Disease

## 2018-09-08 DIAGNOSIS — R918 Other nonspecific abnormal finding of lung field: Secondary | ICD-10-CM | POA: Diagnosis not present

## 2018-09-08 DIAGNOSIS — J449 Chronic obstructive pulmonary disease, unspecified: Secondary | ICD-10-CM | POA: Diagnosis not present

## 2018-09-08 NOTE — Assessment & Plan Note (Signed)
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 

## 2018-09-08 NOTE — Assessment & Plan Note (Signed)
Assessment: Multiple elevations on RAST panel in 2017 Patient has known dog allergy with dog at home Maintained on Symbicort 80 Has not needed rescue inhaler since last office visit April/2018 pulmonary function test shows positive bronchodilator response Patient is maintained on Singulair as well as Zyrtec 43-pack-year smoking history March/2020 CT chest shows moderate to severe emphysema June/2020 CT chest shows moderate to severe emphysema, improved right upper lobe opacity Patient denies wheezing or increased shortness of breath  Plan: Continue Symbicort 80, rescue inhaler, Singulair, Zyrtec, nasal saline rinses Follow-up with our office in 4 months

## 2018-09-08 NOTE — Patient Instructions (Addendum)
Continue Symbicort80 >>> 2 puffs in the morning right when you wake up, rinse out your mouth after use, 12 hours later 2 puffs, rinse after use >>> Take this daily, no matter what >>> This is not a rescue inhaler  Only use your albuterol as a rescue medication to be used if you can't catch your breath by resting or doing a relaxed purse lip breathing pattern.  - The less you use it, the better it will work when you need it. - Ok to use up to 2 puffs every 4 hours if you must but call for immediate appointment if use goes up over your usual need - Don't leave home without it !! (think of it like the spare tire for your car)  Continue daily Singulair  Continue daily zyrtec   Start nasal saline rinses twice daily   Allergy info:  Dust Mite Allergy  >>>levels of these increase in November / December  >>>this can heighten your bronchial hypersensitivity and increase chance of respiratory exacerbation  >>> Treatment options: Can get dust mite covers, dry pillows weekly and dryer, in case mattress with cover, cleaning carpets regularly if you have them  Local pollens: Tree pollen-seen more in spring Grass pollen-seen more than summertime Weeds-seen more in fall  Perennial allergens: Dust mite, cockroach, mold indoor, animal dander  Mold-seen more mid summer and fall   Return in about 4 months (around 01/08/2019), or if symptoms worsen or fail to improve, for Follow up with Dr. [name], Follow up with Wyn Quaker FNP-C.   Coronavirus (COVID-19) Are you at risk?  Are you at risk for the Coronavirus (COVID-19)?  To be considered HIGH RISK for Coronavirus (COVID-19), you have to meet the following criteria:  . Traveled to [place], [place], [place], [place] or [place]; or in the [place] to [place], [place], [place], or [place]; and have fever, cough, and shortness of breath within the last 2 weeks of travel OR . Been in close contact with a person diagnosed  with COVID-19 within the last 2 weeks and have fever, cough, and shortness of breath . IF YOU DO NOT MEET THESE CRITERIA, YOU ARE CONSIDERED LOW RISK FOR COVID-19.  What to do if you are HIGH RISK for COVID-19?  [name] If you are having a medical emergency, call 911. . Seek medical care right away. Before you go to a doctor's office, urgent care or emergency department, call ahead and tell them about your recent travel, contact with someone diagnosed with COVID-19, and your symptoms. You should receive instructions from your physician's office regarding next steps of care.  . When you arrive at healthcare provider, tell the healthcare staff immediately you have returned from visiting [place], [place], [place], [place] or [place]; or traveled in the [place] to [place], [place], [place], or [place]; in the last two weeks or you have been in close contact with a person diagnosed with COVID-19 in the last 2 weeks.   . Tell the health care staff about your symptoms: fever, cough and shortness of breath. . After you have been seen by a medical provider, you will be either: o Tested for (COVID-19) and discharged home on quarantine except to seek medical care if symptoms worsen, and asked to  - Stay home and avoid contact with others until you get your results (4-5 days)  - Avoid travel on public transportation if possible (such as bus, train, or airplane) or o Sent to the Emergency  Department by EMS for evaluation, COVID-19 testing, and possible admission depending on your condition and test results.  What to do if you are LOW RISK for COVID-19?  Reduce your risk of any infection by using the same precautions used for avoiding the common cold or flu:  [name] Wash your hands often with soap and warm water for at least 20 seconds.  If soap and water are not readily available, use an alcohol-based hand sanitizer with at least 60% alcohol.  . If coughing or sneezing, cover your mouth and nose by coughing  or sneezing into the elbow areas of your shirt or coat, into a tissue or into your sleeve (not your hands). . Avoid shaking hands with others and consider head nods or verbal greetings only. . Avoid touching your eyes, nose, or mouth with unwashed hands.  . Avoid close contact with people who are sick. . Avoid places or events with large numbers of people in one location, like concerts or sporting events. . Carefully consider travel plans you have or are making. . If you are planning any travel outside or inside the [place], visit the CDC's Travelers' Health webpage for the latest health notices. . If you have some symptoms but not all symptoms, continue to monitor at home and seek medical attention if your symptoms worsen. . If you are having a medical emergency, call 911.   [place] / e-Visit: [email or web address]         [facility]: [facility]: [phone]                   [facility]: [phone]           It is flu season:   >>> Best ways to protect herself from the flu: Receive the yearly flu vaccine, practice good hand hygiene washing with soap and also using hand sanitizer when available, eat a nutritious meals, get adequate rest, hydrate appropriately   Please contact the office if your symptoms worsen or you have concerns that you are not improving.   Thank you for choosing [facility] for your healthcare, and for allowing [place] to partner with you on your healthcare journey. I am thankful to be able to provide care to you today.   Wyn Quaker FNP-C

## 2018-09-29 DIAGNOSIS — M7541 Impingement syndrome of right shoulder: Secondary | ICD-10-CM | POA: Diagnosis not present

## 2018-10-07 ENCOUNTER — Telehealth (INDEPENDENT_AMBULATORY_CARE_PROVIDER_SITE_OTHER): Payer: Medicare Other | Admitting: Internal Medicine

## 2018-10-07 ENCOUNTER — Encounter: Payer: Self-pay | Admitting: Internal Medicine

## 2018-10-07 VITALS — BP 110/82 | HR 60 | Ht 66.0 in | Wt 138.0 lb

## 2018-10-07 DIAGNOSIS — Z8679 Personal history of other diseases of the circulatory system: Secondary | ICD-10-CM

## 2018-10-07 DIAGNOSIS — E785 Hyperlipidemia, unspecified: Secondary | ICD-10-CM

## 2018-10-07 DIAGNOSIS — I10 Essential (primary) hypertension: Secondary | ICD-10-CM

## 2018-10-07 DIAGNOSIS — I255 Ischemic cardiomyopathy: Secondary | ICD-10-CM | POA: Diagnosis not present

## 2018-10-07 DIAGNOSIS — Z951 Presence of aortocoronary bypass graft: Secondary | ICD-10-CM

## 2018-10-07 DIAGNOSIS — J449 Chronic obstructive pulmonary disease, unspecified: Secondary | ICD-10-CM | POA: Diagnosis not present

## 2018-10-07 DIAGNOSIS — I499 Cardiac arrhythmia, unspecified: Secondary | ICD-10-CM

## 2018-10-07 NOTE — Patient Instructions (Addendum)
Medication Instructions:  Your physician recommends that you continue on your current medications as directed. Please refer to the Current Medication list given to you today.  If you need a refill on your cardiac medications before your next appointment, please call your pharmacy.   Testing/Procedures: Your provider has ordered a ZIO monitor. You will wear this for 14 days.   1. Avoid showering during the first 24 hours of wearing the monitor. 2. Avoid excessive sweating to help maximize wear time. 3. Do not submerge the device, no hot tubs, and no swimming pools. 4. Keep any lotions or oils away from the patch. 5. After 24 hours you may shower with the patch on. Take brief showers with your back facing the shower head.  6. Do not remove patch once it has been placed because that will interrupt data and decrease adhesive wear time. 7. Push the button when you have any symptoms and write down what you were feeling. 8. Once you have completed wearing your monitor, remove and place into box which has postage paid and place in your outgoing mailbox.  9. If for some reason you have misplaced your box then call our office and we can provide another box and/or mail it off for you.   Follow-Up: At [facility], you and your health needs are our priority.  As part of our continuing mission to provide you with exceptional heart care, we have created designated Provider Care Teams.  These Care Teams include your primary Cardiologist (physician) and Advanced Practice Providers (APPs -  Physician Assistants and Nurse Practitioners) who all work together to provide you with the care you need, when you need it. You will need a follow up appointment in 4-6 weeks after monitor. You may see Dr. Debara Pickett or one of the following Advanced Practice Providers on your designated Care Team: Almyra Deforest, [place] . Fabian Sharp, PA-C

## 2018-10-07 NOTE — Progress Notes (Signed)
Virtual Visit via Video Note   This visit type was conducted due to national recommendations for restrictions regarding the COVID-19 Pandemic (e.g. social distancing) in an effort to limit this patient's exposure and mitigate transmission in our community.  Due to his co-morbid illnesses, this patient is at least at moderate risk for complications without adequate follow up.  This format is felt to be most appropriate for this patient at this time.  All issues noted in this document were discussed and addressed.  A limited physical exam was performed with this format.  Please refer to the patient's chart for his consent to telehealth for [facility].   Evaluation Performed:  Doxy.me video  Date:  10/07/2018   ID:  [name], DOB Apr 17, 1940, MRN [id number]  Patient Location:  [address] [place] [address]  Provider location:   [address], [place], [place] [address]  PCP:  Jonathan Pretty, MD  Cardiologist:  No primary care provider on file. Electrophysiologist:  None   Chief Complaint:  Shortness of breath, irregular heartbeat  History of Present Illness:    [name] is a 78 y.o. male who presents via audio/video conferencing for a telehealth visit today.  [name] was seen today in video follow-up.  He has a past medical history significant for coronary artery bypass grafting in 2008.  Postoperatively he had atrial fibrillation but has not had recurrence since then.  In fact every office visit EKGs have shown sinus rhythm.  He also has type 2 diabetes, COPD, dyslipidemia and history of prostate cancer.  Recently reports he has had a little worsening shortness of breath particularly walking upstairs.  He denies any chest pain with this.  Is not clear whether this could represent worsening coronary disease.  He had a stress test about 3 years ago which showed small fixed defect possibly scar with decreased EF to 46%, however an echo subsequent to that  showed EF was 50 to 55%.  Otherwise no complaints.  He did provide vitals today including well-controlled blood pressure and heart rate of 60, however the blood pressure cuff noted that his heart rhythm was irregular.  He denies any palpitations.  The patient does not have symptoms concerning for COVID-19 infection (fever, chills, cough, or new SHORTNESS OF BREATH).    Prior CV studies:   The following studies were reviewed today:  Chart reviewed   PMHx:  Past Medical History:  Diagnosis Date  . Asthma   . CAD (coronary artery disease)    cath & CABG in 2008  . COPD (chronic obstructive pulmonary disease) ([place])   . Diabetes ([place])    type 2  . Dyslipidemia   . History of nuclear stress test 10/08/2007   bruce myoview; normal scan with attenuation artifact; no ischemia/infarct; low risk   . Prostate cancer ([place])   . S/P CABG (coronary artery bypass graft) 2008   post-op AF with no recurrence     Past Surgical History:  Procedure Laterality Date  . CARDIAC CATHETERIZATION  2003   total occlusion of LAD with left to left collaterals, mod disease of RCA & mild disease in Cfx (Dr. Rockne Menghini)  . Carotid Doppler  06/2010   stenosis of ICA less than 50%   . CORONARY ARTERY BYPASS GRAFT  05/31/2006   LIMA to LAD, SVG to OM1 & OM2, SVG to PDA (Dr. Prescott Gum)  . NASAL SEPTOPLASTY W/ TURBINOPLASTY  1970s  . PROSTATE BIOPSY    .  TONSILLECTOMY      FAMHx:  Family History  Problem Relation Age of Onset  . Diabetes Mother        died of CVA at 79, also HTN  . Alzheimer's disease Father   . CAD Father   . Heart attack Father 34       also HTN  . Stroke Maternal Grandmother   . Lung disease Neg Hx     SOCHx:   reports that he quit smoking about 11 years ago. His smoking use included cigarettes. He started smoking about 54 years ago. He has a 43.00 pack-year smoking history. He has never used smokeless tobacco. He reports current alcohol use. He reports that he does not use drugs.   ALLERGIES:  No Known Allergies  MEDS:  Current Meds  Medication Sig  . albuterol (PROVENTIL HFA;VENTOLIN HFA) 108 (90 Base) MCG/ACT inhaler Inhale 2 puffs into the lungs every 6 (six) hours as needed for wheezing or shortness of breath.  [name] aspirin EC 81 MG tablet Take 81 mg by mouth at bedtime.  . Cyanocobalamin (VITAMIN B-12 SL) Place 1 tablet under the tongue daily.  . folic acid (FOLVITE) 1 MG tablet Take 1 mg by mouth daily.  [name] losartan (COZAAR) 25 MG tablet Take 25 mg by mouth daily. as directed  . metFORMIN (GLUCOPHAGE) 500 MG tablet Take 500 mg by mouth 2 (two) times daily with a meal.   . montelukast (SINGULAIR) 10 MG tablet TAKE 1 TABLET BY MOUTH AT BEDTIME  . Omega-3 Fatty Acids (FISH OIL PO) Take 1 capsule by mouth at bedtime.  . pravastatin (PRAVACHOL) 40 MG tablet Take 1 tablet (40 mg total) by mouth daily.  . SYMBICORT 80-4.5 MCG/ACT inhaler INHALE 2 PUFFS INTO THE LUNGS TWICE DAILY  . [DISCONTINUED] ranitidine (ZANTAC) 300 MG tablet Take by mouth.     ROS: Pertinent items noted in HPI and remainder of comprehensive ROS otherwise negative.  Labs/Other Tests and Data Reviewed:    Recent Labs: No results found for requested labs within last 8760 hours.   Recent Lipid Panel No results found for: CHOL, TRIG, HDL, CHOLHDL, LDLCALC, LDLDIRECT  Wt Readings from Last 3 Encounters:  10/07/18 138 lb (62.6 kg)  09/08/18 141 lb 9.6 oz (64.2 kg)  05/14/18 144 lb 4.8 oz (65.5 kg)     Exam:    Vital Signs:  BP 110/82   Pulse 60   Ht 5\' 6"  (1.676 m)   Wt 138 lb (62.6 kg)   BMI 22.27 kg/m    General appearance: alert and no distress Lungs: no audible wheezing Abdomen: soft, non-tender; bowel sounds normal; no masses,  no organomegaly Extremities: extremities normal, atraumatic, no cyanosis or edema Skin: Skin color, texture, turgor normal. No rashes or lesions Neurologic: Mental status: Alert, oriented, thought content appropriate Psych: Pleasant  ASSESSMENT &  PLAN:    1. Irregular heartbeat 2. Coronary artery disease status post four-vessel CABG in 2008 3. Postoperative atrial fibrillation without recurrence 4. Diabetes type 2 5. Dyslipidemia 5.   History of GI bleeding 6.   COPD  Mr. Morandi was found to have irregular heartbeat on his blood pressure monitor today.  Although we do not have the capability to do EKG, and concern is could represent A. fib.  He is recently had a little more dyspnea with exertion.  Heart rate is slow and he is not on a beta-blocker or calcium channel blocker.  It is feasible this could be atrial fibrillation because he  had it postoperatively however could also be PACs or PVCs.  I like to place a 2-week ZIO patch to see why his heart rate is irregular as he would be at increased risk for stroke if he has A. fib.  We will follow-up virtually.  COVID-19 Education: The signs and symptoms of COVID-19 were discussed with the patient and how to seek care for testing (follow up with PCP or arrange E-visit).  The importance of social distancing was discussed today.  Patient Risk:   After full review of this patients clinical status, I feel that they are at least moderate risk at this time.  Time:   Today, I have spent 25 minutes with the patient with telehealth technology discussing irregular heartbeat, CABG, hypertension, type 2 diabetes, dyslipidemia, COPD.     Medication Adjustments/Labs and Tests Ordered: Current medicines are reviewed at length with the patient today.  Concerns regarding medicines are outlined above.   Tests Ordered: Orders Placed This Encounter  Procedures  . LONG TERM MONITOR (3-14 DAYS)    Medication Changes: No orders of the defined types were placed in this encounter.   Disposition:  in 1 month(s)  Pixie Casino, MD, [facility], [place] Director of the Advanced Lipid Disorders &  Cardiovascular Risk Reduction Clinic Diplomate of the American Board of  Clinical Lipidology Attending Cardiologist  Direct Dial: [phone]  Fax: [phone]  Website:  [email or web address]  Pixie Casino, MD  10/07/2018 9:17 AM

## 2018-10-08 ENCOUNTER — Telehealth: Payer: Self-pay | Admitting: *Deleted

## 2018-10-08 ENCOUNTER — Ambulatory Visit: Payer: Medicare Other | Admitting: Adult Health

## 2018-10-08 NOTE — Telephone Encounter (Signed)
14 day ZIO XT long term holter monitor to be mailed to patients home.  Instructions reviewed briefly as they are included in the monitor kit.

## 2018-10-16 NOTE — Telephone Encounter (Signed)
Follow up   Patient has questions about the monitor. Please call.

## 2018-10-17 NOTE — Telephone Encounter (Signed)
Spoke with patients wife and answered all her questions about the monitor. She knows to call back if she has difficulty applying monitor and can come in wed for me to help.

## 2018-10-18 ENCOUNTER — Ambulatory Visit (INDEPENDENT_AMBULATORY_CARE_PROVIDER_SITE_OTHER): Payer: Medicare Other

## 2018-10-18 DIAGNOSIS — I499 Cardiac arrhythmia, unspecified: Secondary | ICD-10-CM

## 2018-10-18 DIAGNOSIS — I4891 Unspecified atrial fibrillation: Secondary | ICD-10-CM

## 2018-10-18 DIAGNOSIS — Z8679 Personal history of other diseases of the circulatory system: Secondary | ICD-10-CM

## 2018-10-18 DIAGNOSIS — I9789 Other postprocedural complications and disorders of the circulatory system, not elsewhere classified: Secondary | ICD-10-CM

## 2018-10-18 DIAGNOSIS — R0609 Other forms of dyspnea: Secondary | ICD-10-CM | POA: Diagnosis not present

## 2018-10-18 DIAGNOSIS — Z951 Presence of aortocoronary bypass graft: Secondary | ICD-10-CM | POA: Diagnosis not present

## 2018-11-12 DIAGNOSIS — I499 Cardiac arrhythmia, unspecified: Secondary | ICD-10-CM | POA: Diagnosis not present

## 2018-11-12 DIAGNOSIS — I4891 Unspecified atrial fibrillation: Secondary | ICD-10-CM | POA: Diagnosis not present

## 2018-11-12 DIAGNOSIS — R0609 Other forms of dyspnea: Secondary | ICD-10-CM | POA: Diagnosis not present

## 2018-11-13 ENCOUNTER — Other Ambulatory Visit: Payer: Self-pay | Admitting: Internal Medicine

## 2018-11-13 DIAGNOSIS — R0609 Other forms of dyspnea: Secondary | ICD-10-CM

## 2018-11-13 DIAGNOSIS — Z951 Presence of aortocoronary bypass graft: Secondary | ICD-10-CM

## 2018-11-13 DIAGNOSIS — Z8679 Personal history of other diseases of the circulatory system: Secondary | ICD-10-CM

## 2018-11-13 DIAGNOSIS — I4891 Unspecified atrial fibrillation: Secondary | ICD-10-CM

## 2018-11-13 DIAGNOSIS — I499 Cardiac arrhythmia, unspecified: Secondary | ICD-10-CM

## 2018-11-17 ENCOUNTER — Telehealth: Payer: Self-pay | Admitting: Internal Medicine

## 2018-11-17 NOTE — Telephone Encounter (Signed)
New Message  Patient's wife is calling in to get approval to accompany the patient at his appointment on 11/18/18 at 9:45 am. Please give patient's wife a call back to confirm.

## 2018-11-17 NOTE — Telephone Encounter (Signed)
Wife made aware she can come to visit with MD. Durward Fortes to wear mask, she is aware they will be temperature screened/COVID19 screened

## 2018-11-17 NOTE — Telephone Encounter (Signed)
Spoke with wife// voices concerns and would like permission to attend Mr [facility] appointment tomorrow 11/18/2018.  - Wife/ pt unhappy with virtual app, monitor placed w/o ekg, pulse or ascultation prior to order. Felt pt should have been seen. - pt has hearing and slight remembering issues. Would prefer that she accompanies him. But will write questions for him if she can't come.  Please advise, wife was told she will hear back today.

## 2018-11-18 ENCOUNTER — Other Ambulatory Visit: Payer: Self-pay

## 2018-11-18 ENCOUNTER — Ambulatory Visit (INDEPENDENT_AMBULATORY_CARE_PROVIDER_SITE_OTHER): Payer: Medicare Other | Admitting: Internal Medicine

## 2018-11-18 ENCOUNTER — Encounter: Payer: Self-pay | Admitting: Internal Medicine

## 2018-11-18 VITALS — BP 134/76 | HR 53 | Ht 66.0 in | Wt 144.2 lb

## 2018-11-18 DIAGNOSIS — I471 Supraventricular tachycardia, unspecified: Secondary | ICD-10-CM

## 2018-11-18 DIAGNOSIS — I491 Atrial premature depolarization: Secondary | ICD-10-CM | POA: Diagnosis not present

## 2018-11-18 DIAGNOSIS — I493 Ventricular premature depolarization: Secondary | ICD-10-CM | POA: Diagnosis not present

## 2018-11-18 DIAGNOSIS — Z951 Presence of aortocoronary bypass graft: Secondary | ICD-10-CM | POA: Diagnosis not present

## 2018-11-18 DIAGNOSIS — I472 Ventricular tachycardia: Secondary | ICD-10-CM | POA: Diagnosis not present

## 2018-11-18 DIAGNOSIS — I4729 Other ventricular tachycardia: Secondary | ICD-10-CM

## 2018-11-18 DIAGNOSIS — I10 Essential (primary) hypertension: Secondary | ICD-10-CM

## 2018-11-18 NOTE — Patient Instructions (Addendum)
Medication Instructions:  Your physician recommends that you continue on your current medications as directed. Please refer to the Current Medication list given to you today.  If you need a refill on your cardiac medications before your next appointment, please call your pharmacy.    Testing/Procedures: Dr. Debara Pickett has ordered a Lexiscan Myocardial Perfusion Imaging Study (stress test).  This is to be scheduled at [address]rd [address] ([facility])  Please arrive 15 minutes prior to your appointment time for registration and insurance purposes.   The test will take approximately 3 to 4 hours to complete; you may bring reading material.  If someone comes with you to your appointment, they will need to remain in the main lobby due to limited space in the testing area.  How to prepare for your Myocardial Perfusion Test:  Do not eat or drink 3 hours prior to your test, except you may have water.  Do not consume products containing caffeine (regular or decaffeinated) 12 hours prior to your test. (ex: coffee, chocolate, sodas, tea).  Do wear comfortable clothes (no dresses or overalls) and walking shoes, tennis shoes preferred (No heels or open toe shoes are allowed).  Do NOT wear cologne, perfume, aftershave, or lotions (deodorant is allowed).  If you use an inhaler, use it the AM of your test and bring it with you.   If you use a nebulizer, use it the AM of your test.   If these instructions are not followed, your test will have to be rescheduled.   Follow-Up: At [facility], you and your health needs are our priority.  As part of our continuing mission to provide you with exceptional heart care, we have created designated Provider Care Teams.  These Care Teams include your primary Cardiologist (physician) and Advanced Practice Providers (APPs -  Physician Assistants and Nurse Practitioners) who all work together to provide you with the care you need, when you need it. You  will need a follow up appointment in 3 months. You may see Dr. Debara Pickett or one of the following Advanced Practice Providers on your designated Care Team: Almyra Deforest, [place] . Fabian Sharp, PA-C  Any Other Special Instructions Will Be Listed Below (If Applicable).  You have been referred to Dr. Reggy Eye @ [address]rd [address]  -- cardiac electrophysiologist

## 2018-11-18 NOTE — Progress Notes (Signed)
OFFICE NOTE  Chief Complaint:  Follow-up monitor  Primary Care Physician: [name], MD  HPI:  [name] is a pleasant 78 yo male with a history of CAD and emergent CABG in 2008 x 4 vessels (LIMA to LAD, sequential SVG to OM1 and OM 2, SVG to PDA) , complicated by transient postop atrial fibrillation. He has had no recurrence of any arrhythmias.  He has been maintained on 325 mg aspirin.  Any recurrence of chest pain. Recently was seen in Dr. Julianne Rice office after an episode of hypotension and bradycardia. He was noted to be pale and somewhat lethargic. His wife had noted that he had dark stools and there was concern for possible GI bleeding. He did have laboratory work which indicated an acute drop in hemoglobin and hematocrit. His H&H was 14 and 41 on 07/28/2012 and decreased to 10 and 31 on 01/29/2013.  Because he was having some confusion and dizziness apparently he underwent a CT scan of the head which was negative and a chest x-ray which was unremarkable. Stool Y. x-ray never performed. He was however referred to a GI doctor and has an upcoming appointment to his blood pressure medicine was decreased due to hypotension and is normotensive today. He still reports some fatigue but his symptoms have improved he denies any ongoing melena or hematochezia.  [name] returns today for follow-up. It turns out that he did have some gastric erosions which appears to have healed on a PPI. I reduced his aspirin 81 mg and he's had no further bleeding episodes. He denies any chest pain or shortness of breath.   I saw [name] back in the office today. Overall he seems to be doing well and is asymptomatic. He had recent laboratory work which again shows a stable cholesterol with a total around 150 and LDL around 80. He is tolerating medications without any difficulty. He remains physically active without any shortness of breath or chest pain.  It is been 8 years since his bypass surgery and he has  not had reassessment of he is bypass graft since that time.  02/15/2016  [name] returns today for follow-up. Overall he feels well. He denies any chest pain or worsening shortness of breath. He's had no recurrent A. fib. His blood pressure is well-controlled. Weight is within normal limits. He was switched from lisinopril to losartan 25 mg daily due to cough.  09/14/2016  [name] is seen today in follow-up. Overall he seems to be doing well. He recently went on a walk with his wife few days ago about 45 minutes up and down hills with no worsening shortness of breath or chest pain. He's been working with Dr. Ashok Cordia with pulmonary for COPD has been on Symbicort. This is helped his breathing. Of note his last EF was 50-55% on echo last year. He denies any worsening shortness of breath or chest pain. Blood pressure is well-controlled today. He is overdue for a lipid profile which we will assess. He is not on a beta blocker due to his lung disease this point.  08/19/2017  [name] returns today for follow-up.  He denies any chest pain or worsening shortness of breath.  He has noted some persistent cough.  He was seen by pulmonary in the past and is been on Symbicort.  He has a recently been having an increase in LDL cholesterol.  Over the past 3 years the LDL is risen from 66-78-79.  This may be  likely due to dietary changes.  He reports compliance with pravastatin.   11/18/2018  [name] is here today to follow-up his monitor.  I recently saw him during a telemedicine visit, and he is been noted by his wife to have an irregular heart rate when taking his pulse.  He denies any palpitations, shortness of breath or worsening chest pain.  Based on this we placed a monitor to rule out any A. fib.  The monitor was negative for A. fib, however did show PVCs, PACs, some NSVT and SVT.  He is not on beta-blocker or calcium channel blocker due to symptomatic bradycardia in the past.  Heart rate runs in the low  50s.  Ultimately I suspect that he may need an EP evaluation for antiarrhythmics.  Based on this, however though he does have a history of coronary disease and prior CABG in 2008.  Certainly his increase in ectopy may be related to ischemia and I like to rule out any silent ischemia, even though he is not describing chest pain symptoms.  PMHx:  Past Medical History:  Diagnosis Date  . Asthma   . CAD (coronary artery disease)    cath & CABG in 2008  . COPD (chronic obstructive pulmonary disease) ([place])   . Diabetes ([place])    type 2  . Dyslipidemia   . History of nuclear stress test 10/08/2007   bruce myoview; normal scan with attenuation artifact; no ischemia/infarct; low risk   . Prostate cancer ([place])   . S/P CABG (coronary artery bypass graft) 2008   post-op AF with no recurrence     Past Surgical History:  Procedure Laterality Date  . CARDIAC CATHETERIZATION  2003   total occlusion of LAD with left to left collaterals, mod disease of RCA & mild disease in Cfx (Dr. Rockne Menghini)  . Carotid Doppler  06/2010   stenosis of ICA less than 50%   . CORONARY ARTERY BYPASS GRAFT  05/31/2006   LIMA to LAD, SVG to OM1 & OM2, SVG to PDA (Dr. Prescott Gum)  . NASAL SEPTOPLASTY W/ TURBINOPLASTY  1970s  . PROSTATE BIOPSY    . TONSILLECTOMY      FAMHx:  Family History  Problem Relation Age of Onset  . Diabetes Mother        died of CVA at 63, also HTN  . Alzheimer's disease Father   . CAD Father   . Heart attack Father 53       also HTN  . Stroke Maternal Grandmother   . Lung disease Neg Hx     SOCHx:   reports that he quit smoking about 11 years ago. His smoking use included cigarettes. He started smoking about 54 years ago. He has a 43.00 pack-year smoking history. He has never used smokeless tobacco. He reports current alcohol use. He reports that he does not use drugs.  ALLERGIES:  No Known Allergies  ROS: Pertinent items noted in HPI and remainder of comprehensive ROS otherwise  negative.  HOME MEDS: Current Outpatient Medications  Medication Sig Dispense Refill  . albuterol (PROVENTIL HFA;VENTOLIN HFA) 108 (90 Base) MCG/ACT inhaler Inhale 2 puffs into the lungs every 6 (six) hours as needed for wheezing or shortness of breath. 1 Inhaler 3  . aspirin EC 81 MG tablet Take 81 mg by mouth at bedtime.    . Cyanocobalamin (VITAMIN B-12 SL) Place 1 tablet under the tongue daily.    . folic acid (FOLVITE) 1 MG tablet Take 1 mg  by mouth daily.    [name] losartan (COZAAR) 25 MG tablet Take 25 mg by mouth daily. as directed  1  . metFORMIN (GLUCOPHAGE) 500 MG tablet Take 500 mg by mouth 2 (two) times daily with a meal.     . montelukast (SINGULAIR) 10 MG tablet TAKE 1 TABLET BY MOUTH AT BEDTIME 90 tablet 1  . Omega-3 Fatty Acids (FISH OIL PO) Take 1 capsule by mouth at bedtime.    . pravastatin (PRAVACHOL) 40 MG tablet Take 1 tablet (40 mg total) by mouth daily. 90 tablet 3  . SYMBICORT 80-4.5 MCG/ACT inhaler INHALE 2 PUFFS INTO THE LUNGS TWICE DAILY 10.2 g 3   No current facility-administered medications for this visit.     LABS/IMAGING: No results found for this or any previous visit (from the past 48 hour(s)). No results found.  VITALS: BP 134/76   Pulse (!) 53   Ht 5\' 6"  (1.676 m)   Wt 144 lb 3.2 oz (65.4 kg)   BMI 23.27 kg/m   EXAM: Deferred  EKG: Deferred  ASSESSMENT: 1. 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 Coronary artery disease status post four-vessel CABG in 2008 3. Postoperative atrial fibrillation without recurrence 4. Diabetes type 2 5. Dyslipidemia 5.   History of GI bleeding 6.   COPD  PLAN: 1.   Mr. Eschete was noted to have asymptomatic frequent ventricular and atrial ectopy with some nonsustained VT.  He has remote bypass surgery in 2008.  His last ischemic evaluation was 2016.  I recommend we repeat Myoview stress testing.  If there is any evidence of significant ischemia, then he may need a repeat catheterization.  Otherwise I  would refer him to EP for evaluation of possible antiarrhythmic therapy.  His wife noted that after bypass he had some A. fib and was on amiodarone but not tolerant of it.  There could be other options for suppressing his ventricular ectopy as well.  I appreciate EP opinion on this, given his baseline bradycardia with heart rates around 50.  Follow-up with me in 3 months.  Pixie Casino, MD, [facility], [place] Director of the Advanced Lipid Disorders &  Cardiovascular Risk Reduction Clinic Diplomate of the American Board of Clinical Lipidology Attending Cardiologist  Direct Dial: [phone]  Fax: [phone]  Website:  [email or web address] 11/18/2018, 12:51 PM

## 2018-11-20 ENCOUNTER — Telehealth (HOSPITAL_COMMUNITY): Payer: Self-pay | Admitting: *Deleted

## 2018-11-20 NOTE — Telephone Encounter (Signed)
Patient given detailed instructions per Myocardial Perfusion Study Information Sheet for the test on 11/25/18. Patient notified to arrive 15 minutes early and that it is imperative to arrive on time for appointment to keep from having the test rescheduled.  If you need to cancel or reschedule your appointment, please call the office within 24 hours of your appointment. . Patient verbalized understanding. .[name]     

## 2018-11-25 ENCOUNTER — Ambulatory Visit (HOSPITAL_COMMUNITY): Payer: Medicare Other | Attending: Cardiovascular Disease

## 2018-11-25 ENCOUNTER — Other Ambulatory Visit: Payer: Self-pay

## 2018-11-25 DIAGNOSIS — I471 Supraventricular tachycardia: Secondary | ICD-10-CM | POA: Diagnosis not present

## 2018-11-25 DIAGNOSIS — I472 Ventricular tachycardia: Secondary | ICD-10-CM | POA: Insufficient documentation

## 2018-11-25 DIAGNOSIS — I493 Ventricular premature depolarization: Secondary | ICD-10-CM | POA: Diagnosis not present

## 2018-11-25 DIAGNOSIS — I4729 Other ventricular tachycardia: Secondary | ICD-10-CM

## 2018-11-25 DIAGNOSIS — I491 Atrial premature depolarization: Secondary | ICD-10-CM

## 2018-11-25 DIAGNOSIS — Z951 Presence of aortocoronary bypass graft: Secondary | ICD-10-CM | POA: Insufficient documentation

## 2018-11-25 LAB — MYOCARDIAL PERFUSION IMAGING
LV dias vol: 128 mL (ref 62–150)
LV sys vol: 73 mL
Peak HR: 83 {beats}/min
Rest HR: 59 {beats}/min
SDS: 4
SRS: 2
SSS: 6
TID: 1.01

## 2018-11-25 MED ORDER — TECHNETIUM TC 99M TETROFOSMIN IV KIT
30.9000 | PACK | Freq: Once | INTRAVENOUS | Status: AC | PRN
  Administered 2018-11-25: 30.9 via INTRAVENOUS
  Filled 2018-11-25: qty 31

## 2018-11-25 MED ORDER — REGADENOSON 0.4 MG/5ML IV SOLN
0.4000 mg | Freq: Once | INTRAVENOUS | Status: AC
  Administered 2018-11-25: 0.4 mg via INTRAVENOUS

## 2018-11-25 MED ORDER — TECHNETIUM TC 99M TETROFOSMIN IV KIT
10.4000 | PACK | Freq: Once | INTRAVENOUS | Status: AC | PRN
  Administered 2018-11-25: 10.4 via INTRAVENOUS
  Filled 2018-11-25: qty 11

## 2018-12-03 ENCOUNTER — Telehealth: Payer: Self-pay | Admitting: Cardiology

## 2018-12-03 NOTE — Telephone Encounter (Signed)
Wife states she needs to be in the room during her Husband's upcoming appointment with Dr. Curt Bears. She states her husband does not have the best memory, and is worried that he may forget to mention key details to Dr. Curt Bears.   The wife also states that patient has COPD and other lung conditions. She wants to make sure that any new medications could not cause adverse reactions.   Please let the wife know what she will be able to do

## 2018-12-03 NOTE — Telephone Encounter (Signed)
Spoke to pt.   Conformed that it was ok for his wife to come with him to appt. He appreciates this and will see [place] next week for consult.

## 2018-12-09 ENCOUNTER — Ambulatory Visit (INDEPENDENT_AMBULATORY_CARE_PROVIDER_SITE_OTHER): Payer: Medicare Other | Admitting: Cardiology

## 2018-12-09 ENCOUNTER — Other Ambulatory Visit: Payer: Self-pay

## 2018-12-09 ENCOUNTER — Encounter: Payer: Self-pay | Admitting: Cardiology

## 2018-12-09 ENCOUNTER — Telehealth: Payer: Self-pay | Admitting: Cardiology

## 2018-12-09 VITALS — BP 114/66 | HR 144 | Ht 66.0 in | Wt 142.0 lb

## 2018-12-09 DIAGNOSIS — I480 Paroxysmal atrial fibrillation: Secondary | ICD-10-CM

## 2018-12-09 DIAGNOSIS — I10 Essential (primary) hypertension: Secondary | ICD-10-CM | POA: Diagnosis not present

## 2018-12-09 MED ORDER — APIXABAN 5 MG PO TABS: 5.0000 mg | ORAL_TABLET | Freq: Two times a day (BID) | ORAL | 11 refills | Status: DC

## 2018-12-09 MED ORDER — DILTIAZEM HCL ER COATED BEADS 180 MG PO CP24: 180.0000 mg | ORAL_CAPSULE | Freq: Every day | ORAL | 11 refills | Status: DC

## 2018-12-09 NOTE — Telephone Encounter (Signed)
Informed wife that instructions at today's OV were not completely correct. Informed that Dr. Curt Bears wanted:  DCCV in 3-4 weeks, EKG prior to DCCV (if NSR, cancel DCCV), and follow up with Rebecca Eaton post procedure. Advised that I would call this week/next to make these arrangement. Wife verbalized understanding and agreeable to plan.

## 2018-12-09 NOTE — Patient Instructions (Addendum)
Medication Instructions:  Please discontinue your Aspirin. Please start Eliquis 5 mg twice a day. Start Diltiazem 180 mg daily. Continue all other medications as listed.  If you need a refill on your cardiac medications before your next appointment, please call your pharmacy.    Testing/Procedures: Your physician has requested that you have a Cardioversion in 3 to 4 weeks. Electrical Cardioversion uses a jolt of electricity to your heart either through paddles or wired patches attached to your chest. This is a controlled, usually prescheduled, procedure. This procedure is done at the hospital and you are not awake during the procedure. You usually go home the day of the procedure. Please see the instruction sheet given to you today for more information.  Follow-Up: At [facility], you and your health needs are our priority.  As part of our continuing mission to provide you with exceptional heart care, we have created designated Provider Care Teams.  These Care Teams include your primary Cardiologist (physician) and Advanced Practice Providers (APPs -  Physician Assistants and Nurse Practitioners) who all work together to provide you with the care you need, when you need it. . Follow up in 1 month with EP APP for EKG prior to cardioversion. . Follow up with Dr Curt Bears in 3 months.  Thank you for choosing [facility]!!      Atrial Fibrillation  Atrial fibrillation is a type of heartbeat that is irregular or fast (rapid). If you have this condition, your heart beats without any order. This makes it hard for your heart to pump blood in a normal way. Having this condition gives you more risk for stroke, heart failure, and other heart problems. Atrial fibrillation may start all of a sudden and then stop on its own, or it may become a long-lasting problem. What are the causes? This condition may be caused by heart conditions, such as:  High blood pressure.  Heart failure.   Heart valve disease.  Heart surgery. Other causes include:  Pneumonia.  Obstructive sleep apnea.  Lung cancer.  Thyroid disease.  Drinking too much alcohol. Sometimes the cause is not known. What increases the risk? You are more likely to develop this condition if:  You smoke.  You are older.  You have diabetes.  You are overweight.  You have a family history of this condition.  You exercise often and hard. What are the signs or symptoms? Common symptoms of this condition include:  A feeling like your heart is beating very fast.  Chest pain.  Feeling short of breath.  Feeling light-headed or weak.  Getting tired easily. Follow these instructions at home: Medicines  Take over-the-counter and prescription medicines only as told by your doctor.  If your doctor gives you a blood-thinning medicine, take it exactly as told. Taking too much of it can cause bleeding. Taking too little of it does not protect you against clots. Clots can cause a stroke. Lifestyle      Do not use any tobacco products. These include cigarettes, chewing tobacco, and e-cigarettes. If you need help quitting, ask your doctor.  Do not drink alcohol.  Do not drink beverages that have caffeine. These include coffee, soda, and tea.  Follow diet instructions as told by your doctor.  Exercise regularly as told by your doctor. General instructions  If you have a condition that causes breathing to stop for a short period of time (apnea), treat it as told by your doctor.  Keep a healthy weight. Do not use  diet pills unless your doctor says they are safe for you. Diet pills may make heart problems worse.  Keep all follow-up visits as told by your doctor. This is important. Contact a doctor if:  You notice a change in the speed, rhythm, or strength of your heartbeat.  You are taking a blood-thinning medicine and you see more bruising.  You get tired more easily when you move or  exercise.  You have a sudden change in weight. Get help right away if:   You have pain in your chest or your belly (abdomen).  You have trouble breathing.  You have blood in your vomit, poop, or pee (urine).  You have any signs of a stroke. "BE FAST" is an easy way to remember the main warning signs: ? B - Balance. Signs are dizziness, sudden trouble walking, or loss of balance. ? E - Eyes. Signs are trouble seeing or a change in how you see. ? F - Face. Signs are sudden weakness or loss of feeling in the face, or the face or eyelid drooping on one side. ? A - Arms. Signs are weakness or loss of feeling in an arm. This happens suddenly and usually on one side of the body. ? S - Speech. Signs are sudden trouble speaking, slurred speech, or trouble understanding what people say. ? T - Time. Time to call emergency services. Write down what time symptoms started.  You have other signs of a stroke, such as: ? A sudden, very bad headache with no known cause. ? Feeling sick to your stomach (nausea). ? Throwing up (vomiting). ? Jerky movements you cannot control (seizure). These symptoms may be an emergency. Do not wait to see if the symptoms will go away. Get medical help right away. Call your local emergency services (911 in the [place] Do not drive yourself to the hospital. Summary  Atrial fibrillation is a type of heartbeat that is irregular or fast (rapid).  You are at higher risk of this condition if you smoke, are older, have diabetes, or are overweight.  Follow your doctor's instructions about medicines, diet, exercise, and follow-up visits.  Get help right away if you think that you have signs of a stroke. This information is not intended to replace advice given to you by your health care provider. Make sure you discuss any questions you have with your health care provider. Document Released: 12/13/2007 Document Revised: 05/09/2017 Document Reviewed: 04/26/2017 Elsevier Patient  Education  2020 [employer].   Curator cardioversion is the delivery of a jolt of electricity to restore a normal rhythm to the heart. A rhythm that is too fast or is not regular keeps the heart from pumping well. In this procedure, sticky patches or metal paddles are placed on the chest to deliver electricity to the heart from a device. This procedure may be done in an emergency if: There is low or no blood pressure as a result of the heart rhythm. Normal rhythm must be restored as fast as possible to protect the brain and heart from further damage. It may save a life. This procedure may also be done for irregular or fast heart rhythms that are not immediately life-threatening. Tell a health care provider about: Any allergies you have. All medicines you are taking, including vitamins, herbs, eye drops, creams, and over-the-counter medicines. Any problems you or family members have had with anesthetic medicines. Any blood disorders you have. Any surgeries you have had. Any medical conditions you  have. Whether you are pregnant or may be pregnant. What are the risks? Generally, this is a safe procedure. However, problems may occur, including: Allergic reactions to medicines. A blood clot that breaks free and travels to other parts of your body. The possible return of an abnormal heart rhythm within hours or days after the procedure. Your heart stopping (cardiac arrest). This is rare. What happens before the procedure? Medicines Your health care provider may have you start taking: Blood-thinning medicines (anticoagulants) so your blood does not clot as easily. Medicines may be given to help stabilize your heart rate and rhythm. Ask your health care provider about changing or stopping your regular medicines. This is especially important if you are taking diabetes medicines or blood thinners. General instructions Plan to have someone take you home from the  hospital or clinic. If you will be going home right after the procedure, plan to have someone with you for 24 hours. Follow instructions from your health care provider about eating or drinking restrictions. What happens during the procedure? To lower your risk of infection: Your health care team will wash or sanitize their hands. Your skin will be washed with soap. An IV tube will be inserted into one of your veins. You will be given a medicine to help you relax (sedative). Sticky patches (electrodes) or metal paddles may be placed on your chest. An electrical shock will be delivered. The procedure may vary among health care providers and hospitals. What happens after the procedure?  Your blood pressure, heart rate, breathing rate, and blood oxygen level will be monitored until the medicines you were given have worn off. Do not drive for 24 hours if you were given a sedative. Your heart rhythm will be watched to make sure it does not change. This information is not intended to replace advice given to you by your health care provider. Make sure you discuss any questions you have with your health care provider. Document Released: 02/23/2002 Document Revised: 02/15/2017 Document Reviewed: 09/09/2015 Elsevier Patient Education  [place]. Diltiazem extended-release capsules or tablets What is this medicine? DILTIAZEM (dil TYE a zem) is a calcium-channel blocker. It affects the amount of calcium found in your heart and muscle cells. This relaxes your blood vessels, which can reduce the amount of work the heart has to do. This medicine is used to treat high blood pressure and chest pain caused by angina. This medicine may be used for other purposes; ask your health care provider or pharmacist if you have questions. COMMON BRAND NAME(S): Cardizem CD, Cardizem LA, Cardizem SR, Cartia XT, Dilacor XR, Dilt-CD, Diltia XT, Diltzac, Matzim LA, Rema Fendt, TIADYLT ER, Tiamate, Tiazac What should I  tell my health care provider before I take this medicine? They need to know if you have any of these conditions:  heart problems, low blood pressure, irregular heartbeat  liver disease  previous heart attack  an unusual or allergic reaction to diltiazem, other medicines, foods, dyes, or preservatives  pregnant or trying to get pregnant  breast-feeding How should I use this medicine? Take this medicine by mouth with a glass of water. Follow the directions on the prescription label. Swallow whole, do not crush or chew. Ask your doctor or pharmacist if your should take this medicine with food. Take your doses at regular intervals. Do not take your medicine more often then directed. Do not stop taking except on the advice of your doctor or health care professional. Ask your doctor or health care  professional how to gradually reduce the dose. Talk to your pediatrician regarding the use of this medicine in children. Special care may be needed. Overdosage: If you think you have taken too much of this medicine contact a poison control center or emergency room at once. NOTE: This medicine is only for you. Do not share this medicine with others. What if I miss a dose? If you miss a dose, take it as soon as you can. If it is almost time for your next dose, take only that dose. Do not take double or extra doses. What may interact with this medicine? Do not take this medicine with any of the following medications:  cisapride  hawthorn  pimozide  ranolazine  red yeast rice This medicine may also interact with the following medications:  buspirone  carbamazepine  cimetidine  cyclosporine  digoxin  local anesthetics or general anesthetics  lovastatin  medicines for anxiety or difficulty sleeping like midazolam and triazolam  medicines for high blood pressure or heart problems  quinidine  rifampin, rifabutin, or rifapentine This list may not describe all possible interactions.  Give your health care provider a list of all the medicines, herbs, non-prescription drugs, or dietary supplements you use. Also tell them if you smoke, drink alcohol, or use illegal drugs. Some items may interact with your medicine. What should I watch for while using this medicine? Check your blood pressure and pulse rate regularly. Ask your doctor or health care professional what your blood pressure and pulse rate should be and when you should contact him or her. You may feel dizzy or lightheaded. Do not drive, use machinery, or do anything that needs mental alertness until you know how this medicine affects you. To reduce the risk of dizzy or fainting spells, do not sit or stand up quickly, especially if you are an older patient. Alcohol can make you more dizzy or increase flushing and rapid heartbeats. Avoid alcoholic drinks. What side effects may I notice from receiving this medicine? Side effects that you should report to your doctor or health care professional as soon as possible:  allergic reactions like skin rash, itching or hives, swelling of the face, lips, or tongue  confusion, mental depression  feeling faint or lightheaded, falls  redness, blistering, peeling or loosening of the skin, including inside the mouth  slow, irregular heartbeat  swelling of the feet and ankles  unusual bleeding or bruising, pinpoint red spots on the skin Side effects that usually do not require medical attention (report to your doctor or health care professional if they continue or are bothersome):  constipation or diarrhea  difficulty sleeping  facial flushing  headache  nausea, vomiting  sexual dysfunction  weak or tired This list may not describe all possible side effects. Call your doctor for medical advice about side effects. You may report side effects to FDA at 1-800-FDA-1088. Where should I keep my medicine? Keep out of the reach of children. Store at room temperature between 15  and 30 degrees C (59 and 86 degrees F). Protect from humidity. Throw away any unused medicine after the expiration date. NOTE: This sheet is a summary. It may not cover all possible information. If you have questions about this medicine, talk to your doctor, pharmacist, or health care provider.  2020 Elsevier/Gold Standard (2007-06-26 14:35:47)

## 2018-12-09 NOTE — Telephone Encounter (Signed)
New Message  Patient/patient's wife is calling in with questions regarding the health plan that was made today with Dr.Camnitz. Patient would like to know that if he is in Afib, how long after the follow up visit on 01/07/19 will the Cardioversion be scheduled. Patient is wondering because his family has a 4-day beach trip scheduled for October 29th. Also, patient would like to know if he is able to drink coffee (at most 2 cups a day). Please give patient/patient's wife a call back to discuss.

## 2018-12-09 NOTE — Progress Notes (Signed)
Electrophysiology Office Note   Date:  12/09/2018   ID:  [name], DOB 07/14/1940, MRN [id number]  PCP:  [name], MD  Cardiologist:  Debara Pickett Primary Electrophysiologist:  Thailan Sava Meredith Leeds, MD    Chief Complaint: palpitations   History of Present Illness: [name] is a 78 y.o. male who is being seen today for the evaluation of SVT, VT at the request of Hilty, Nadean Corwin, MD. Presenting today for electrophysiology evaluation.  He has a history of coronary artery disease status post emergent CABG in 20084 with postoperative atrial fibrillation, COPD, hyperlipidemia, asthma.  He recently wore a cardiac monitor that was negative for atrial fibrillation, though did show PVCs, PACs, nonsustained VT and SVT.  He is currently not on a beta-blocker due to symptomatic bradycardia.  Today, he denies symptoms of palpitations, chest pain, shortness of breath, orthopnea, PND, lower extremity edema, claudication, dizziness, presyncope, syncope, bleeding, or neurologic sequela. The patient is tolerating medications without difficulties.  He gets mildly short of breath when he climbs stairs, but otherwise feels well.  His ECG today shows rapid atrial fibrillation with heart rates in the 140s.   Past Medical History:  Diagnosis Date  . Asthma   . CAD (coronary artery disease)    cath & CABG in 2008  . COPD (chronic obstructive pulmonary disease) ([place])   . Diabetes ([place])    type 2  . Dyslipidemia   . History of nuclear stress test 10/08/2007   bruce myoview; normal scan with attenuation artifact; no ischemia/infarct; low risk   . Prostate cancer ([place])   . S/P CABG (coronary artery bypass graft) 2008   post-op AF with no recurrence    Past Surgical History:  Procedure Laterality Date  . CARDIAC CATHETERIZATION  2003   total occlusion of LAD with left to left collaterals, mod disease of RCA & mild disease in Cfx (Dr. Rockne Menghini)  . 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 CORONARY ARTERY BYPASS GRAFT  05/31/2006   LIMA to LAD, SVG to OM1 & OM2, SVG to PDA (Dr. Prescott Gum)  . NASAL SEPTOPLASTY W/ TURBINOPLASTY  1970s  . PROSTATE BIOPSY    . TONSILLECTOMY       Current Outpatient Medications  Medication Sig Dispense Refill  . albuterol (PROVENTIL HFA;VENTOLIN HFA) 108 (90 Base) MCG/ACT inhaler Inhale 2 puffs into the lungs every 6 (six) hours as needed for wheezing or shortness of breath. 1 Inhaler 3  . Cyanocobalamin (VITAMIN B-12 SL) Place 1 tablet under the tongue daily.    . folic acid (FOLVITE) 1 MG tablet Take 1 mg by mouth daily.    [name] losartan (COZAAR) 25 MG tablet Take 25 mg by mouth daily. as directed  1  . metFORMIN (GLUCOPHAGE) 500 MG tablet Take 500 mg by mouth 2 (two) times daily with a meal.     . montelukast (SINGULAIR) 10 MG tablet TAKE 1 TABLET BY MOUTH AT BEDTIME 90 tablet 1  . nitroGLYCERIN (NITROSTAT) 0.4 MG SL tablet Place 0.4 mg under the tongue every 5 (five) minutes x 3 doses as needed.    . Omega-3 Fatty Acids (FISH OIL PO) Take 1 capsule by mouth at bedtime.    . pravastatin (PRAVACHOL) 40 MG tablet Take 1 tablet (40 mg total) by mouth daily. 90 tablet 3  . SYMBICORT 80-4.5 MCG/ACT inhaler INHALE 2 PUFFS INTO THE LUNGS TWICE DAILY 10.2 g 3  . apixaban (  ELIQUIS) 5 MG TABS tablet Take 1 tablet (5 mg total) by mouth 2 (two) times daily. 60 tablet 11  . diltiazem (CARDIZEM CD) 180 MG 24 hr capsule Take 1 capsule (180 mg total) by mouth daily. 30 capsule 11   No current facility-administered medications for this visit.     Allergies:   Patient has no known allergies.   Social History:  The patient  reports that he quit smoking about 11 years ago. His smoking use included cigarettes. He started smoking about 54 years ago. He has a 43.00 pack-year smoking history. He has never used smokeless tobacco. He reports current alcohol use. He reports that he does not use drugs.   Family History:  The patient's family history includes  Alzheimer's disease in his father; CAD in his father; Diabetes in his mother; Heart attack (age of onset: 63) in his father; Stroke in his maternal grandmother.    ROS:  Please see the history of present illness.   Otherwise, review of systems is positive for none.   All other systems are reviewed and negative.    PHYSICAL EXAM: VS:  BP 114/66   Pulse (!) 144   Ht 5\' 6"  (1.676 m)   Wt 142 lb (64.4 kg)   SpO2 99%   BMI 22.92 kg/m  , BMI Body mass index is 22.92 kg/m. GEN: Well nourished, well developed, in no acute distress  HEENT: normal  Neck: no JVD, carotid bruits, or masses Cardiac: Tachycardic, irregular; no murmurs, rubs, or gallops,no edema  Respiratory:  clear to auscultation bilaterally, normal work of breathing GI: soft, nontender, nondistended, + BS MS: no deformity or atrophy  Skin: warm and dry Neuro:  Strength and sensation are intact Psych: euthymic mood, full affect  EKG:  EKG is ordered today. Personal review of the ekg ordered shows atrial fibrillation, rate 144  Recent Labs: No results found for requested labs within last 8760 hours.    Lipid Panel  No results found for: CHOL, TRIG, HDL, CHOLHDL, VLDL, LDLCALC, LDLDIRECT   Wt Readings from Last 3 Encounters:  12/09/18 142 lb (64.4 kg)  11/25/18 144 lb (65.3 kg)  11/18/18 144 lb 3.2 oz (65.4 kg)      Other studies Reviewed: Additional studies/ records that were reviewed today include: TTE 03/08/16  Review of the above records today demonstrates:  - Left ventricle: The cavity size was normal. There was mild   concentric hypertrophy. Systolic function was normal. The   estimated ejection fraction was in the range of 50% to 55%. Wall   motion was normal; there were no regional wall motion   abnormalities. Doppler parameters are consistent with abnormal   left ventricular relaxation (grade 1 diastolic dysfunction). The   E/e&' ratio is between 8-15, suggesting indeterminate LV filling   pressure./  - Mitral valve: Mildly thickened leaflets . There was mild   regurgitation. - Left atrium: The atrium was normal in size. - Right ventricle: The cavity size was mildly dilated. Systolic   function is mildly reduced. - Right atrium: The atrium was mildly dilated. - Tricuspid valve: There was trivial regurgitation. - Pulmonary arteries: PA peak pressure: 28 mm Hg (S). - Inferior vena cava: The vessel was normal in size. The   respirophasic diameter changes were in the normal range (= 50%),   consistent with normal central venous pressure.  Monitor 11/17/18 personally reviewed Moderate burden of PVCs and PACs, intermittent SVT and short runs of NSVT noted.  SPECT 11/25/18  Nuclear stress EF: 43%.  There was no ST segment deviation noted during stress.  No T wave inversion was noted during stress.  Findings consistent with prior myocardial infarction.  This is a low risk study.  The left ventricular ejection fraction is moderately decreased (30-44%).  ASSESSMENT AND PLAN:  1.  PACs/nonsustained VT: Questionable his symptoms, though he does have some palpitations.  Is unclear to me if his palpitations are due to PVCs or atrial fibrillation.  I Addalyn Speedy hold off on further management until we get atrial fibrillation under better control.  2.  Coronary artery disease status post CABG: No current chest pain  3.  Atrial fibrillation: At this point is unclear to me how long he has been in atrial fibrillation.  His rate is quite fast and thus he Chauncy Mangiaracina need improved rate control.  We Naome Brigandi start him on Eliquis and 180 mg of diltiazem.  We Mayrin Schmuck also set him up for cardioversion in 3 weeks.  I think that he Levonia Wolfley likely need an antiarrhythmic after that to keep him in rhythm.  I think amiodarone would likely be his best option as he does have ventricular arrhythmias as seen on his cardiac monitor.  I have discussed this with his primary cardiologist.   Current medicines are reviewed at length with the  patient today.   The patient does not have concerns regarding his medicines.  The following changes were made today: Start Eliquis, diltiazem  Labs/ tests ordered today include:  Orders Placed This Encounter  Procedures  . EKG 12-Lead     Disposition:   FU with Fransisca Shawn 3 months  Signed, Dason Mosley Meredith Leeds, MD  12/09/2018 10:41 AM     [facility] [address] [place] [address] [phone] (office) [phone] (fax)

## 2018-12-19 DIAGNOSIS — Z23 Encounter for immunization: Secondary | ICD-10-CM | POA: Diagnosis not present

## 2018-12-19 DIAGNOSIS — R234 Changes in skin texture: Secondary | ICD-10-CM | POA: Diagnosis not present

## 2018-12-19 DIAGNOSIS — M205X1 Other deformities of toe(s) (acquired), right foot: Secondary | ICD-10-CM | POA: Diagnosis not present

## 2018-12-19 DIAGNOSIS — M67471 Ganglion, right ankle and foot: Secondary | ICD-10-CM | POA: Diagnosis not present

## 2018-12-19 DIAGNOSIS — M6740 Ganglion, unspecified site: Secondary | ICD-10-CM | POA: Diagnosis not present

## 2018-12-25 ENCOUNTER — Other Ambulatory Visit: Payer: Self-pay

## 2018-12-25 NOTE — Patient Outreach (Signed)
[facility]) Care Management  12/25/2018  [name] 15-Jun-1940 [id number]   Medication Adherence call to [name] [facility] Identifiers Verify spoke with patient he is past due on Metformin 500 mg patient explain he is only taking 1 tablet 2 times daily not 2 tablets 2 times daily per doctor's instructions patient explain he has plenty at this time and wont need any for another 2 month. [name] is showing past due under [place].   [facility] Direct Dial [phone]  Fax [phone] [email or web address]

## 2018-12-26 DIAGNOSIS — M6740 Ganglion, unspecified site: Secondary | ICD-10-CM | POA: Diagnosis not present

## 2019-01-07 ENCOUNTER — Ambulatory Visit: Payer: Medicare Other | Admitting: Student

## 2019-01-09 NOTE — Telephone Encounter (Signed)
Spent 20 min on phone w/ pts wife. She reports pt is doing well and has had only one episode of AFib since seeing Dr. Curt Bears. Prefer to have EKG done before arranging DCCV.  Scheduled to have pt come by the office next Tuesday for and EKG.  Aware that if abnormal rhythm we will schedule DCCV, if NSR we would schedule 1 month follow up with Jonni Sanger, [place]. Wife verbalized understanding and agreeable to plan.

## 2019-01-13 ENCOUNTER — Other Ambulatory Visit: Payer: Self-pay

## 2019-01-13 ENCOUNTER — Ambulatory Visit (INDEPENDENT_AMBULATORY_CARE_PROVIDER_SITE_OTHER): Payer: Medicare Other | Admitting: *Deleted

## 2019-01-13 VITALS — Ht 66.0 in | Wt 144.8 lb

## 2019-01-13 DIAGNOSIS — I480 Paroxysmal atrial fibrillation: Secondary | ICD-10-CM | POA: Diagnosis not present

## 2019-01-13 NOTE — Progress Notes (Signed)
1.  Reason for visit: EKG  2.  Name of MD requesting visit:  Camnitz  3. H&P:  See epic  4.  ROS related to problem:  See epic  5.  Assessment and plan per MD:   EKG performed showing NSR, HR 68. Reviewed by Dr. Curt Bears, no orders.  Keep current follow up in December.

## 2019-01-22 DIAGNOSIS — R234 Changes in skin texture: Secondary | ICD-10-CM | POA: Diagnosis not present

## 2019-01-22 DIAGNOSIS — M67471 Ganglion, right ankle and foot: Secondary | ICD-10-CM | POA: Diagnosis not present

## 2019-01-22 DIAGNOSIS — E1351 Other specified diabetes mellitus with diabetic peripheral angiopathy without gangrene: Secondary | ICD-10-CM | POA: Diagnosis not present

## 2019-01-30 DIAGNOSIS — S299XXA Unspecified injury of thorax, initial encounter: Secondary | ICD-10-CM | POA: Diagnosis not present

## 2019-01-30 DIAGNOSIS — R0781 Pleurodynia: Secondary | ICD-10-CM | POA: Diagnosis not present

## 2019-01-30 DIAGNOSIS — I10 Essential (primary) hypertension: Secondary | ICD-10-CM | POA: Diagnosis not present

## 2019-02-02 ENCOUNTER — Telehealth: Payer: Self-pay | Admitting: Adult Health

## 2019-02-02 DIAGNOSIS — H0102A Squamous blepharitis right eye, upper and lower eyelids: Secondary | ICD-10-CM | POA: Diagnosis not present

## 2019-02-02 DIAGNOSIS — E119 Type 2 diabetes mellitus without complications: Secondary | ICD-10-CM | POA: Diagnosis not present

## 2019-02-02 DIAGNOSIS — H0102B Squamous blepharitis left eye, upper and lower eyelids: Secondary | ICD-10-CM | POA: Diagnosis not present

## 2019-02-02 DIAGNOSIS — Z961 Presence of intraocular lens: Secondary | ICD-10-CM | POA: Diagnosis not present

## 2019-02-02 DIAGNOSIS — H26491 Other secondary cataract, right eye: Secondary | ICD-10-CM | POA: Diagnosis not present

## 2019-02-02 MED ORDER — BUDESONIDE-FORMOTEROL FUMARATE 80-4.5 MCG/ACT IN AERO: 2.0000 | INHALATION_SPRAY | Freq: Two times a day (BID) | RESPIRATORY_TRACT | 0 refills | Status: DC

## 2019-02-02 MED ORDER — MONTELUKAST SODIUM 10 MG PO TABS: 10.0000 mg | ORAL_TABLET | Freq: Every day | ORAL | 0 refills | Status: DC

## 2019-02-02 NOTE — Telephone Encounter (Signed)
Received faxed refill requests from [facility] for Singulair 10mg  #90 and Symbicort 80 #10.2g  Last office visit 6.22.2020 with Warner Mccreedy NP with recs to follow up in 4 months - no pending appts  Singulair 10mg  last refilled 5.11.2020 #90 w/ 1 Symbicort 80 last refilled 06/06/18 #10.2g w/ 3  Medications refilled x1 with note that patient is overdue for appt  Will sign off

## 2019-02-09 NOTE — Telephone Encounter (Signed)
LMTCB to discuss 02/19/19 appt - virtual clinic ONLY Appointment type has been changed - need to confirm with patient or r/s

## 2019-02-17 ENCOUNTER — Telehealth: Payer: Self-pay | Admitting: Internal Medicine

## 2019-02-17 NOTE — Telephone Encounter (Signed)
New message   Patient wife states that she will be coming with patient to visit on 02/19/19 because the patient does not remember well. Please advise.

## 2019-02-18 NOTE — Telephone Encounter (Signed)
Spoke with wife and explained that 02/19/2019 visit is virtual only. Notified her that we tried to contact them via MyChart and phone. She would like him to be r/s for in-office -- arranged on 02/20/2019 @ 2:30pm. OK for her to attend - patient has difficulty remembering, does not know medications, does not ask questions

## 2019-02-19 ENCOUNTER — Telehealth: Payer: Medicare Other | Admitting: Internal Medicine

## 2019-02-20 ENCOUNTER — Ambulatory Visit: Payer: Medicare Other | Admitting: Internal Medicine

## 2019-02-20 ENCOUNTER — Other Ambulatory Visit: Payer: Self-pay

## 2019-02-20 VITALS — BP 114/72 | HR 69 | Ht 66.0 in | Wt 146.4 lb

## 2019-02-20 DIAGNOSIS — Z951 Presence of aortocoronary bypass graft: Secondary | ICD-10-CM | POA: Diagnosis not present

## 2019-02-20 DIAGNOSIS — I480 Paroxysmal atrial fibrillation: Secondary | ICD-10-CM

## 2019-02-20 DIAGNOSIS — I10 Essential (primary) hypertension: Secondary | ICD-10-CM | POA: Diagnosis not present

## 2019-02-20 DIAGNOSIS — I471 Supraventricular tachycardia: Secondary | ICD-10-CM

## 2019-02-20 NOTE — Patient Instructions (Signed)
Medication Instructions:  Your physician recommends that you continue on your current medications as directed. Please refer to the Current Medication list given to you today. *If you need a refill on your cardiac medications before your next appointment, please call your pharmacy*  Lab Work: NONE If you have labs (blood work) drawn today and your tests are completely normal, you will receive your results only by: [name] MyChart Message (if you have MyChart) OR . A paper copy in the mail If you have any lab test that is abnormal or we need to change your treatment, we will call you to review the results.  Testing/Procedures: NONE  Follow-Up: At [facility], you and your health needs are our priority.  As part of our continuing mission to provide you with exceptional heart care, we have created designated Provider Care Teams.  These Care Teams include your primary Cardiologist (physician) and Advanced Practice Providers (APPs -  Physician Assistants and Nurse Practitioners) who all work together to provide you with the care you need, when you need it.  Your next appointment:   6 month(s)  The format for your next appointment:   In Person  Provider:   You may see Dr. Debara Pickett or one of the following Advanced Practice Providers on your designated Care Team:    Almyra Deforest, PA-C  Fabian Sharp, [place] or   Roby Lofts, [place]   Other Instructions

## 2019-02-20 NOTE — Progress Notes (Signed)
OFFICE NOTE  Chief Complaint:  Follow-up  Primary Care Physician: [name], MD  HPI:  [name] is a pleasant 78 yo male with a history of CAD and emergent CABG in 2008 x 4 vessels (LIMA to LAD, sequential SVG to OM1 and OM 2, SVG to PDA) , complicated by transient postop atrial fibrillation. He has had no recurrence of any arrhythmias.  He has been maintained on 325 mg aspirin.  Any recurrence of chest pain. Recently was seen in Dr. Julianne Rice office after an episode of hypotension and bradycardia. He was noted to be pale and somewhat lethargic. His wife had noted that he had dark stools and there was concern for possible GI bleeding. He did have laboratory work which indicated an acute drop in hemoglobin and hematocrit. His H&H was 14 and 41 on 07/28/2012 and decreased to 10 and 31 on 01/29/2013.  Because he was having some confusion and dizziness apparently he underwent a CT scan of the head which was negative and a chest x-ray which was unremarkable. Stool Y. x-ray never performed. He was however referred to a GI doctor and has an upcoming appointment to his blood pressure medicine was decreased due to hypotension and is normotensive today. He still reports some fatigue but his symptoms have improved he denies any ongoing melena or hematochezia.  [name] returns today for follow-up. It turns out that he did have some gastric erosions which appears to have healed on a PPI. I reduced his aspirin 81 mg and he's had no further bleeding episodes. He denies any chest pain or shortness of breath.   I saw [name] back in the office today. Overall he seems to be doing well and is asymptomatic. He had recent laboratory work which again shows a stable cholesterol with a total around 150 and LDL around 80. He is tolerating medications without any difficulty. He remains physically active without any shortness of breath or chest pain.  It is been 8 years since his bypass surgery and he has not had  reassessment of he is bypass graft since that time.  02/15/2016  [name] returns today for follow-up. Overall he feels well. He denies any chest pain or worsening shortness of breath. He's had no recurrent A. fib. His blood pressure is well-controlled. Weight is within normal limits. He was switched from lisinopril to losartan 25 mg daily due to cough.  09/14/2016  [name] is seen today in follow-up. Overall he seems to be doing well. He recently went on a walk with his wife few days ago about 45 minutes up and down hills with no worsening shortness of breath or chest pain. He's been working with Dr. Ashok Cordia with pulmonary for COPD has been on Symbicort. This is helped his breathing. Of note his last EF was 50-55% on echo last year. He denies any worsening shortness of breath or chest pain. Blood pressure is well-controlled today. He is overdue for a lipid profile which we will assess. He is not on a beta blocker due to his lung disease this point.  08/19/2017  [name] returns today for follow-up.  He denies any chest pain or worsening shortness of breath.  He has noted some persistent cough.  He was seen by pulmonary in the past and is been on Symbicort.  He has a recently been having an increase in LDL cholesterol.  Over the past 3 years the LDL is risen from 66-78-79.  This may be likely  due to dietary changes.  He reports compliance with pravastatin.   11/18/2018  [name] is here today to follow-up his monitor.  I recently saw him during a telemedicine visit, and he is been noted by his wife to have an irregular heart rate when taking his pulse.  He denies any palpitations, shortness of breath or worsening chest pain.  Based on this we placed a monitor to rule out any A. fib.  The monitor was negative for A. fib, however did show PVCs, PACs, some NSVT and SVT.  He is not on beta-blocker or calcium channel blocker due to symptomatic bradycardia in the past.  Heart rate runs in the low 50s.   Ultimately I suspect that he may need an EP evaluation for antiarrhythmics.  Based on this, however though he does have a history of coronary disease and prior CABG in 2008.  Certainly his increase in ectopy may be related to ischemia and I like to rule out any silent ischemia, even though he is not describing chest pain symptoms.  02/20/2019  [name] returns today for follow-up.  Overall he is doing well.  In the interim he saw Dr. Curt Bears with electrophysiology who was following up on monitor findings of NSVT and atrial and ventricular ectopy.  In addition he was found to have some A. fib ultimately.  He had remote A. fib post CABG in 2008 and was on amiodarone for short period of time but this was discontinued.  Based on some of these findings now, he was started on diltiazem and seems to be tolerating that well.  He denies any significant recurrent A. fib or palpitations.  He is blood pressure is also well controlled at 114/72.  He was also started on Eliquis 5 mg twice daily which again he is tolerating.  PMHx:  Past Medical History:  Diagnosis Date  . Asthma   . CAD (coronary artery disease)    cath & CABG in 2008  . COPD (chronic obstructive pulmonary disease) ([place])   . Diabetes ([place])    type 2  . Dyslipidemia   . History of nuclear stress test 10/08/2007   bruce myoview; normal scan with attenuation artifact; no ischemia/infarct; low risk   . Prostate cancer ([place])   . S/P CABG (coronary artery bypass graft) 2008   post-op AF with no recurrence     Past Surgical History:  Procedure Laterality Date  . CARDIAC CATHETERIZATION  2003   total occlusion of LAD with left to left collaterals, mod disease of RCA & mild disease in Cfx (Dr. Rockne Menghini)  . Carotid Doppler  06/2010   stenosis of ICA less than 50%   . CORONARY ARTERY BYPASS GRAFT  05/31/2006   LIMA to LAD, SVG to OM1 & OM2, SVG to PDA (Dr. Prescott Gum)  . NASAL SEPTOPLASTY W/ TURBINOPLASTY  1970s  . PROSTATE BIOPSY    .  TONSILLECTOMY      FAMHx:  Family History  Problem Relation Age of Onset  . Diabetes Mother        died of CVA at 61, also HTN  . Alzheimer's disease Father   . CAD Father   . Heart attack Father 63       also HTN  . Stroke Maternal Grandmother   . Lung disease Neg Hx     SOCHx:   reports that he quit smoking about 12 years ago. His smoking use included cigarettes. He started smoking about 55 years ago. He  has a 43.00 pack-year smoking history. He has never used smokeless tobacco. He reports current alcohol use. He reports that he does not use drugs.  ALLERGIES:  No Known Allergies  ROS: Pertinent items noted in HPI and remainder of comprehensive ROS otherwise negative.  HOME MEDS: Current Outpatient Medications  Medication Sig Dispense Refill  . albuterol (PROVENTIL HFA;VENTOLIN HFA) 108 (90 Base) MCG/ACT inhaler Inhale 2 puffs into the lungs every 6 (six) hours as needed for wheezing or shortness of breath. 1 Inhaler 3  . apixaban (ELIQUIS) 5 MG TABS tablet Take 1 tablet (5 mg total) by mouth 2 (two) times daily. 60 tablet 11  . budesonide-formoterol (SYMBICORT) 80-4.5 MCG/ACT inhaler Inhale 2 puffs into the lungs 2 (two) times daily. Needs appt for further refills 10.2 g 0  . Cyanocobalamin (VITAMIN B-12 SL) Place 1 tablet under the tongue daily.    [name] diltiazem (CARDIZEM CD) 180 MG 24 hr capsule Take 1 capsule (180 mg total) by mouth daily. 30 capsule 11  . folic acid (FOLVITE) 1 MG tablet Take 1 mg by mouth daily.    [name] losartan (COZAAR) 25 MG tablet Take 25 mg by mouth daily. as directed  1  . metFORMIN (GLUCOPHAGE) 500 MG tablet Take 500 mg by mouth 2 (two) times daily with a meal.     . montelukast (SINGULAIR) 10 MG tablet Take 1 tablet (10 mg total) by mouth at bedtime. Needs appt for further refills 90 tablet 0  . nitroGLYCERIN (NITROSTAT) 0.4 MG SL tablet Place 0.4 mg under the tongue every 5 (five) minutes x 3 doses as needed.    . Omega-3 Fatty Acids (FISH OIL PO)  Take 1 capsule by mouth at bedtime.    . pravastatin (PRAVACHOL) 40 MG tablet Take 1 tablet (40 mg total) by mouth daily. 90 tablet 3   No current facility-administered medications for this visit.     LABS/IMAGING: No results found for this or any previous visit (from the past 48 hour(s)). No results found.  VITALS: BP 114/72   Pulse 69   Ht 5\' 6"  (1.676 m)   Wt 146 lb 6.4 oz (66.4 kg)   SpO2 94%   BMI 23.63 kg/m   EXAM: General appearance: alert and no distress Lungs: clear to auscultation bilaterally Heart: regular rate and rhythm, S1, S2 normal, no murmur, click, rub or gallop Extremities: extremities normal, atraumatic, no cyanosis or edema Neurologic: Mental status: Awake, somewhat confused, follow commands  EKG: Deferred  ASSESSMENT: 1. PAF-CHA2DS2-VASc score 3, now on Eliquis and diltiazem 2. 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 Coronary artery disease status post four-vessel CABG in 2008 4. Postoperative atrial fibrillation without recurrence 5. Diabetes type 2 6. Dyslipidemia 5.   History of GI bleeding 6.   COPD  PLAN: 1.   [name] had some NSVT and atrial and ventricular ectopy.  Subsequently was found to have A. fib and saw Dr. Curt Bears who recommended diltiazem.  This seems to have worked well to both help with his A. fib as well as his ectopy.  Follow-up EKG showed he was in sinus rhythm and he has a regular pulse rate today on exam.  He is tolerating Eliquis without bleeding difficulty although has a history of GI bleeding in the past.  No other changes made to his medicines today.  He is scheduled to see Dr. Curt Bears in about a week.  Follow-up with me in 6 months  Pixie Casino, MD, [facility], [place]  [place] Director of the Advanced Lipid Disorders &  Cardiovascular Risk Reduction Clinic Diplomate of the American Board of Clinical Lipidology Attending Cardiologist  Direct Dial: [phone]  Fax:  [phone]  Website:  [email or web address] 02/20/2019, 2:53 PM

## 2019-02-22 ENCOUNTER — Encounter: Payer: Self-pay | Admitting: Internal Medicine

## 2019-03-05 ENCOUNTER — Other Ambulatory Visit: Payer: Self-pay

## 2019-03-05 ENCOUNTER — Ambulatory Visit: Payer: Medicare Other | Admitting: Cardiology

## 2019-03-05 ENCOUNTER — Encounter: Payer: Self-pay | Admitting: Cardiology

## 2019-03-05 VITALS — BP 128/64 | HR 64 | Ht 66.0 in | Wt 148.0 lb

## 2019-03-05 DIAGNOSIS — I4819 Other persistent atrial fibrillation: Secondary | ICD-10-CM

## 2019-03-05 NOTE — Progress Notes (Signed)
Electrophysiology Office Note   Date:  03/05/2019   ID:  [name], DOB 12/14/1940, MRN [id number]  PCP:  [name], MD  Cardiologist:  Debara Pickett Primary Electrophysiologist:  Jackson Coffield Meredith Leeds, MD    Chief Complaint: palpitations   History of Present Illness: [name] is a 78 y.o. male who is being seen today for the evaluation of SVT, VT at the request of [name], MD. Presenting today for electrophysiology evaluation.  He has a history of coronary artery disease status post emergent CABG in 20084 with postoperative atrial fibrillation, COPD, hyperlipidemia, asthma.  He recently wore a cardiac monitor that was negative for atrial fibrillation, though did show PVCs, PACs, nonsustained VT and SVT.  He is currently not on a beta-blocker due to symptomatic bradycardia.  Today, denies symptoms of palpitations, chest pain, shortness of breath, orthopnea, PND, lower extremity edema, claudication, dizziness, presyncope, syncope, bleeding, or neurologic sequela. The patient is tolerating medications without difficulties.  Overall he is doing well.  He feels much improved since he converted to sinus rhythm.    Past Medical History:  Diagnosis Date  . Asthma   . CAD (coronary artery disease)    cath & CABG in 2008  . COPD (chronic obstructive pulmonary disease) ([place])   . Diabetes ([place])    type 2  . Dyslipidemia   . History of nuclear stress test 10/08/2007   bruce myoview; normal scan with attenuation artifact; no ischemia/infarct; low risk   . Prostate cancer ([place])   . S/P CABG (coronary artery bypass graft) 2008   post-op AF with no recurrence    Past Surgical History:  Procedure Laterality Date  . CARDIAC CATHETERIZATION  2003   total occlusion of LAD with left to left collaterals, mod disease of RCA & mild disease in Cfx (Dr. Rockne Menghini)  . Carotid Doppler  06/2010   stenosis of ICA less than 50%   . CORONARY ARTERY BYPASS GRAFT  05/31/2006   LIMA to LAD, SVG  to OM1 & OM2, SVG to PDA (Dr. Prescott Gum)  . NASAL SEPTOPLASTY W/ TURBINOPLASTY  1970s  . PROSTATE BIOPSY    . TONSILLECTOMY       Current Outpatient Medications  Medication Sig Dispense Refill  . albuterol (PROVENTIL HFA;VENTOLIN HFA) 108 (90 Base) MCG/ACT inhaler Inhale 2 puffs into the lungs every 6 (six) hours as needed for wheezing or shortness of breath. 1 Inhaler 3  . apixaban (ELIQUIS) 5 MG TABS tablet Take 1 tablet (5 mg total) by mouth 2 (two) times daily. 60 tablet 11  . budesonide-formoterol (SYMBICORT) 80-4.5 MCG/ACT inhaler Inhale 2 puffs into the lungs 2 (two) times daily. Needs appt for further refills 10.2 g 0  . Cyanocobalamin (VITAMIN B-12 SL) Place 1 tablet under the tongue daily.    [name] diltiazem (CARDIZEM CD) 180 MG 24 hr capsule Take 1 capsule (180 mg total) by mouth daily. 30 capsule 11  . folic acid (FOLVITE) 1 MG tablet Take 1 mg by mouth daily.    [name] losartan (COZAAR) 25 MG tablet Take 25 mg by mouth daily. as directed  1  . metFORMIN (GLUCOPHAGE) 500 MG tablet Take 500 mg by mouth 2 (two) times daily with a meal.     . montelukast (SINGULAIR) 10 MG tablet Take 1 tablet (10 mg total) by mouth at bedtime. Needs appt for further refills 90 tablet 0  . nitroGLYCERIN (NITROSTAT) 0.4 MG SL tablet Place 0.4 mg under the tongue every 5 (  five) minutes x 3 doses as needed.    . Omega-3 Fatty Acids (FISH OIL PO) Take 1 capsule by mouth at bedtime.    . pravastatin (PRAVACHOL) 40 MG tablet Take 1 tablet (40 mg total) by mouth daily. 90 tablet 3   No current facility-administered medications for this visit.    Allergies:   Patient has no known allergies.   Social History:  The patient  reports that he quit smoking about 12 years ago. His smoking use included cigarettes. He started smoking about 55 years ago. He has a 43.00 pack-year smoking history. He has never used smokeless tobacco. He reports current alcohol use. He reports that he does not use drugs.   Family History:   The patient's family history includes Alzheimer's disease in his father; CAD in his father; Diabetes in his mother; Heart attack (age of onset: 37) in his father; Stroke in his maternal grandmother.    ROS:  Please see the history of present illness.   Otherwise, review of systems is positive for none.   All other systems are reviewed and negative.   PHYSICAL EXAM: VS:  BP 128/64   Pulse 64   Ht 5\' 6"  (1.676 m)   Wt 148 lb (67.1 kg)   SpO2 94%   BMI 23.89 kg/m  , BMI Body mass index is 23.89 kg/m. GEN: Well nourished, well developed, in no acute distress  HEENT: normal  Neck: no JVD, carotid bruits, or masses Cardiac: RRR; no murmurs, rubs, or gallops,no edema  Respiratory:  clear to auscultation bilaterally, normal work of breathing GI: soft, nontender, nondistended, + BS MS: no deformity or atrophy  Skin: warm and dry Neuro:  Strength and sensation are intact Psych: euthymic mood, full affect  EKG:  EKG is ordered today. Personal review of the ekg ordered shows sinus rhythm, PACs, rate 64  Recent Labs: No results found for requested labs within last 8760 hours.    Lipid Panel  No results found for: CHOL, TRIG, HDL, CHOLHDL, VLDL, LDLCALC, LDLDIRECT   Wt Readings from Last 3 Encounters:  03/05/19 148 lb (67.1 kg)  02/20/19 146 lb 6.4 oz (66.4 kg)  01/13/19 144 lb 12.8 oz (65.7 kg)      Other studies Reviewed: Additional studies/ records that were reviewed today include: TTE 03/08/16  Review of the above records today demonstrates:  - Left ventricle: The cavity size was normal. There was mild   concentric hypertrophy. Systolic function was normal. The   estimated ejection fraction was in the range of 50% to 55%. Wall   motion was normal; there were no regional wall motion   abnormalities. Doppler parameters are consistent with abnormal   left ventricular relaxation (grade 1 diastolic dysfunction). The   E/e&' ratio is between 8-15, suggesting indeterminate LV  filling   pressure./ - Mitral valve: Mildly thickened leaflets . There was mild   regurgitation. - Left atrium: The atrium was normal in size. - Right ventricle: The cavity size was mildly dilated. Systolic   function is mildly reduced. - Right atrium: The atrium was mildly dilated. - Tricuspid valve: There was trivial regurgitation. - Pulmonary arteries: PA peak pressure: 28 mm Hg (S). - Inferior vena cava: The vessel was normal in size. The   respirophasic diameter changes were in the normal range (= 50%),   consistent with normal central venous pressure.  Monitor 11/17/18 personally reviewed Moderate burden of PVCs and PACs, intermittent SVT and short runs of NSVT noted.  SPECT  11/25/18  Nuclear stress EF: 43%.  There was no ST segment deviation noted during stress.  No T wave inversion was noted during stress.  Findings consistent with prior myocardial infarction.  This is a low risk study.  The left ventricular ejection fraction is moderately decreased (30-44%).  ASSESSMENT AND PLAN:  1.  PACs/nonsustained VT: None noted on ECG today.  He is minimally symptomatic.  No changes.  2.  Coronary artery disease status post CABG: No current chest pain.  3.  Atrial fibrillation: Only on Eliquis and diltiazem.  CHA2DS2-VASc of at least 3.  Remains in sinus rhythm.  No changes.   Current medicines are reviewed at length with the patient today.   The patient does not have concerns regarding his medicines.  The following changes were made today: None  Labs/ tests ordered today include:  Orders Placed This Encounter  Procedures  . EKG 12-Lead     Disposition:   FU with Lurlene Ronda 6 months  Signed, Kajuan Guyton Meredith Leeds, MD  03/05/2019 12:05 PM     [place] [address] [phone] (office) [phone] (fax)

## 2019-04-07 ENCOUNTER — Other Ambulatory Visit: Payer: Self-pay

## 2019-04-07 MED ORDER — BUDESONIDE-FORMOTEROL FUMARATE 80-4.5 MCG/ACT IN AERO: 2.0000 | INHALATION_SPRAY | Freq: Two times a day (BID) | RESPIRATORY_TRACT | 0 refills | Status: DC

## 2019-04-29 ENCOUNTER — Other Ambulatory Visit: Payer: Self-pay | Admitting: Adult Health

## 2019-05-07 ENCOUNTER — Telehealth: Payer: Self-pay | Admitting: Cardiology

## 2019-05-07 NOTE — Telephone Encounter (Signed)
Patient's wife called stating he bumped his head yesterday when he was walking the dog. He has a hematoma on his forehead and a black eye today.  He did put an ice pack when it happened.  Patient is on Eliquis.  Wife is concerned because the eye is black.  She wants to know if this is normal.  He does not have a headache or confusion.

## 2019-05-07 NOTE — Telephone Encounter (Signed)
Followed up with wife, pt in the background on speakerphone included in the call. Discussed concern.  Hematoma not worsened, reports improved since yesterday. Aware reported bruising sounds normal, wife states no concussion or signs of. Aware of being on Eliquis and how bruising can be worse than if not on it.   Wife will continue to monitor, if concussion signs begin and/or bruising begins to spread to other areas they will go to urgent care/ED for evaluation.. Wife and pt appreciate the return call and agreeable to plan.

## 2019-05-08 ENCOUNTER — Other Ambulatory Visit: Payer: Self-pay

## 2019-05-08 ENCOUNTER — Ambulatory Visit (HOSPITAL_COMMUNITY)
Admission: RE | Admit: 2019-05-08 | Discharge: 2019-05-08 | Disposition: A | Discharge status: Home / Self Care | Payer: Medicare Other | Source: Ambulatory Visit | Attending: Nurse Practitioner | Admitting: Nurse Practitioner

## 2019-05-08 ENCOUNTER — Telehealth: Payer: Self-pay | Admitting: Cardiology

## 2019-05-08 ENCOUNTER — Encounter (HOSPITAL_COMMUNITY): Payer: Self-pay

## 2019-05-08 DIAGNOSIS — S0990XA Unspecified injury of head, initial encounter: Secondary | ICD-10-CM

## 2019-05-08 DIAGNOSIS — S0003XA Contusion of scalp, initial encounter: Secondary | ICD-10-CM | POA: Diagnosis not present

## 2019-05-08 NOTE — Telephone Encounter (Signed)
New Message    Pt c/o medication issue:  1. Name of Medication: Eliquis   2. How are you currently taking this medication (dosage and times per day)?    5 mg  2 x daily   3. Are you having a reaction (difficulty breathing--STAT)? No   4. What is your medication issue? Pt is having Swelling in his face and eyes. Right eye is almost closed and both eyes are black and blue  She is wondering if he should be continuing his Eliquis    Please call

## 2019-05-08 NOTE — Telephone Encounter (Signed)
I spoke with patient's wife and gave her instructions from Dr Harrington Challenger and reviewed ct results with her.  She has received link to activate my chart and will try to do this and send pictures.  Patient's wife expressed concerns about patient being on Eliquis long term and would like to get Dr [employer] input on this.

## 2019-05-08 NOTE — Telephone Encounter (Signed)
Reviewed with Dr Harrington Challenger and patient should hold tonight's dose of Eliquis. He should resume tomorrow. Take picture and send through my chart if possible.

## 2019-05-08 NOTE — Telephone Encounter (Signed)
New message   Patient's wife states that he hit his head on Wednesday and wants to know if he can have a CT Scan. She states that the patient is on blood thinner and he has a lot of bruising on his head. Please call to discuss.

## 2019-05-08 NOTE — Telephone Encounter (Signed)
Pt scheduled for head CT at [facility] today at 1:00 pm.  Wife aware to arrive at 12:45 PM to main entrance of [facility].  Wife thanked Marine scientist for assistance.  Will continue to monitor.

## 2019-05-08 NOTE — Telephone Encounter (Signed)
See previous phone notes. I spoke with patient's wife who reports patient continues to have swelling and bruising. Right eye is almost swollen shut. She feels the swelling is worsening. Both eyes are black and blue and bruising is getting larger. Patient had CT scan today but has not heard results yet. Patient's wife will try to send pictures through my chart. She is asking if patient should continue Eliquis.  Will forward to Dr Harrington Challenger (D[facility]) for review of CT and advisement regarding Eliquis.

## 2019-05-08 NOTE — Telephone Encounter (Signed)
Reviewed with [name] Pt had fall  CT shows bruising but no intracranial bleed I recomm holding Eliquis tonight and then resume tomorrow Wife has concerns about long term anticoagulation [place] defer to [employer]

## 2019-05-08 NOTE — Telephone Encounter (Signed)
Phone call from yesterday and today reviewed by on call.  Advised Pt should have a head CT.  Call placed to radiology at [facility] advised Pt would need prior approval for this test.  Outreach made to precert dept.  Per precert-no precert needed for this test.  Returned call to radiology.  They will call around to see where this test can be done today.  Call placed to Pt's wife.  Pt's wife very happy CT can be done today.  Advised wife would call her as soon as test scheduled to let her know where/when.  Wife thanked nurse for follow up.  Will continue to monitor.

## 2019-05-11 NOTE — Telephone Encounter (Signed)
Left message to call back to discuss.

## 2019-05-11 NOTE — Telephone Encounter (Signed)
I will send message to Sherri P. RN for Dr. Curt Bears that Clifton James, CMA in our office brought the photos to me and I said that I will get them Sherri, RN tomorrow when back in our [facility] location. I will send this note to Sherri to reach out to the pt to assure them she is aware of the photos.

## 2019-05-11 NOTE — Telephone Encounter (Signed)
Left message informing wife I was aware that she dropped off pictures for Dr. Curt Bears to review.

## 2019-05-11 NOTE — Telephone Encounter (Signed)
Patient's wife returning call. She states she came to the office today and dropped off photos of the patient's bruising, because she could not send them through mychart. She would like to know if those were received.

## 2019-05-12 NOTE — Telephone Encounter (Signed)
Patient's wife calling back to follow up on if the patient needs to continue taking his eliquis.

## 2019-05-12 NOTE — Telephone Encounter (Signed)
Followed up w/ wife.  Pictures reviewed. Advised to hold Eliquis for 7 days, per Dr. Curt Bears. Wife is very concerned for pt being on blood thinner.  Educated to CHADS score and risk.  scheduled to have a telephone call next week, with Camnitz, to discuss anticoagulation further. Wife appreciative of the help with this.

## 2019-05-13 ENCOUNTER — Telehealth: Payer: Self-pay | Admitting: Pulmonary Disease

## 2019-05-13 NOTE — Telephone Encounter (Signed)
Called and spoke with Patient's Wife, Hassan Rowan.  Hassan Rowan stated Patient was on a lot of medication,and they did not feel Singulair was needed anymore.  Per last OV with Aaron Edelman, NP, 08/2018, Patient was to stay on Singulair. Advised it was time for follow up. Patient was in background and agreed.  My chart, telephone, and in office visits offered. Patient agreed to 1545, 05/26/2019, in  office visit. Nothing further at this time.

## 2019-05-15 NOTE — Telephone Encounter (Signed)
On call the other day wife asked if pt can hold blood thinner until phone call  Visit with Dr. Curt Bears next Thursday.  Discussed with Camnitz who is agreeable. Pt made aware ok to hold until visit next week. Patient verbalized understanding and agreeable to plan.

## 2019-05-21 ENCOUNTER — Telehealth (INDEPENDENT_AMBULATORY_CARE_PROVIDER_SITE_OTHER): Payer: Medicare Other | Admitting: Cardiology

## 2019-05-21 ENCOUNTER — Other Ambulatory Visit: Payer: Self-pay

## 2019-05-21 DIAGNOSIS — I480 Paroxysmal atrial fibrillation: Secondary | ICD-10-CM

## 2019-05-21 NOTE — Progress Notes (Signed)
Electrophysiology TeleHealth Note   Due to national recommendations of social distancing due to COVID 19, an audio/video telehealth visit is felt to be most appropriate for this patient at this time.  See Epic message for the patient's consent to telehealth for [facility].   Date:  05/21/2019   ID:  [name], DOB [id number], MRN [id number]  Location: patient's home  Provider location: [address], [place]  Evaluation Performed: Follow-up visit  PCP:  [name], MD  Cardiologist:  [facility] Electrophysiologist:  Dr Curt Bears  Chief Complaint:  AF  History of Present Illness:    [name] is a 79 y.o. male who presents via audio/video conferencing for a telehealth visit today.  Since last being seen in our clinic, the patient reports doing very well.  Today, he denies symptoms of palpitations, chest pain, shortness of breath,  lower extremity edema, dizziness, presyncope, or syncope.  The patient is otherwise without complaint today.  The patient denies symptoms of fevers, chills, cough, or new SOB worrisome for COVID 19.  History of coronary artery disease status post CABG x4, atrial fibrillation, COPD, hyperlipidemia, asthma.  He wore a cardiac monitor that showed PVCs, PACs, nonsustained VT, and SVT.  1 fall with quite a bit of bruising on the side of his face.  CT scan showed no evidence of intracranial bleed.  Today, denies symptoms of palpitations, chest pain, shortness of breath, orthopnea, PND, lower extremity edema, claudication, dizziness, presyncope, syncope, bleeding, or neurologic sequela. The patient is tolerating medications without difficulties.    Past Medical History:  Diagnosis Date  . Abnormal finding on lung imaging 05/14/2018   04/09/2018-chest x-ray-ill-defined peripheral right midlung airspace opacity possibly early pneumonia,  >>>follow-up PA lateral chest x-ray is recommended in 3 to 4 weeks following trial of antibiotic therapy to  ensure resolution and exclude underlying malignancy  05/14/2018- chest x-ray-persistent ill-defined right lung interstitial and airspace process possible persistent bronchopneumonia, chest C  . Asthma   . 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 CAD (coronary artery disease)    cath & CABG in 2008  . Cardiomyopathy, 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  . COPD (chronic obstructive pulmonary disease) ([place])   . COPD with acute exacerbation ([place]) 04/02/2018  . Diabetes ([place])    type 2  . DM2 (diabetes mellitus, type 2) ([place]) 02/05/2013  . Dyslipidemia   . Environmental allergies 02/03/2016   02/03/2016-respiratory allergy panel, environmental allergens to dust mites, cat dander, dog dander, Timothy grass, cockroach, fungus, cedar trees, ragweed, rough pigweed, mouse urine, IgE is 689 >>>Largest elevations are to cat dander, dog dander, dust mites   . Essential hypertension 02/03/2016  . Former smoker 05/14/2018   Former smoker Quit 2008 43-pack-year smoking history  . History of nuclear stress test 10/08/2007   bruce myoview; normal scan with attenuation artifact; no ischemia/infarct; low risk   . Hypertriglyceridemia 02/03/2016  . 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 Prostate cancer ([place])   . S/P CABG (coronary artery bypass graft) 2008   post-op AF with no recurrence   . 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 [name] - LIMA to LAD, sequential saphenous  vein graft to OM1 and OM 2, SVG to posterior descending (done emergently)     Past Surgical History:  Procedure Laterality Date  . CARDIAC CATHETERIZATION  2003    total occlusion of LAD with left to left collaterals, mod disease of RCA & mild disease in Cfx (Dr. Rockne Menghini)  . Carotid Doppler  06/2010   stenosis of ICA less than 50%   . CORONARY ARTERY BYPASS GRAFT  05/31/2006   LIMA to LAD, SVG to OM1 & OM2, SVG to PDA (Dr. Prescott Gum)  . NASAL SEPTOPLASTY W/ TURBINOPLASTY  1970s  . PROSTATE BIOPSY    . TONSILLECTOMY      Current Outpatient Medications  Medication Sig Dispense Refill  . albuterol (PROVENTIL HFA;VENTOLIN HFA) 108 (90 Base) MCG/ACT inhaler Inhale 2 puffs into the lungs every 6 (six) hours as needed for wheezing or shortness of breath. 1 Inhaler 3  . apixaban (ELIQUIS) 5 MG TABS tablet Take 1 tablet (5 mg total) by mouth 2 (two) times daily. 60 tablet 11  . budesonide-formoterol (SYMBICORT) 80-4.5 MCG/ACT inhaler Inhale 2 puffs into the lungs 2 (two) times daily. Needs appt for further refills 10.2 g 0  . Cyanocobalamin (VITAMIN B-12 SL) Place 1 tablet under the tongue daily.    [name] diltiazem (CARDIZEM CD) 180 MG 24 hr capsule Take 1 capsule (180 mg total) by mouth daily. 30 capsule 11  . folic acid (FOLVITE) 1 MG tablet Take 1 mg by mouth daily.    [name] losartan (COZAAR) 25 MG tablet Take 25 mg by mouth daily. as directed  1  . metFORMIN (GLUCOPHAGE) 500 MG tablet Take 500 mg by mouth 2 (two) times daily with a meal.     . montelukast (SINGULAIR) 10 MG tablet Take 1 tablet (10 mg total) by mouth at bedtime. Needs appt for further refills 90 tablet 0  . nitroGLYCERIN (NITROSTAT) 0.4 MG SL tablet Place 0.4 mg under the tongue every 5 (five) minutes x 3 doses as needed.    . Omega-3 Fatty Acids (FISH OIL PO) Take 1 capsule by mouth at bedtime.    . pravastatin (PRAVACHOL) 40 MG tablet Take 1 tablet (40 mg total) by mouth daily. 90 tablet 3   No current facility-administered medications for this visit.    Allergies:   Patient has no known allergies.   Social History:  The patient  reports that he quit smoking about 12 years ago. His smoking  use included cigarettes. He started smoking about 55 years ago. He has a 43.00 pack-year smoking history. He has never used smokeless tobacco. He reports current alcohol use. He reports that he does not use drugs.   Family History:  The patient's  family history includes Alzheimer's disease in his father; CAD in his father; Diabetes in his mother; Heart attack (age of onset: 53) in his father; Stroke in his maternal grandmother.   ROS:  Please see the history of present illness.   All other systems are personally reviewed and negative.    Exam:    Vital Signs:  There were no vitals taken for this visit.  No acute distress, no shortness of breath.  Labs/Other Tests and Data Reviewed:    Recent Labs: No results found for requested labs within last 8760 hours.   Wt Readings from Last 3 Encounters:  03/05/19 148 lb (67.1 kg)  02/20/19 146 lb 6.4 oz (66.4 kg)  01/13/19 144 lb 12.8 oz (65.7 kg)     Other studies personally  reviewed: Additional studies/ records that were reviewed today include: Myoview 11/25/18  Review of the above records today demonstrates:    Nuclear stress EF: 43%.  There was no ST segment deviation noted during stress.  No T wave inversion was noted during stress.  Findings consistent with prior myocardial infarction.  This is a low risk study.  The left ventricular ejection fraction is moderately decreased (30-44%).   ASSESSMENT & PLAN:    1.  Excisional atrial fibrillation: Currently on Eliquis and diltiazem.  CHA2DS2-VASc of at least 4.  Unfortunately has had a fall with significant facial bruising.  With him and his wife are questioning whether or not he needs the Eliquis.  We had a long discussion about his risk of stroke as well as risk of being on the Eliquis.  They Kynsli Haapala think about this and call us back to let us know if they feel that Eliquis is the right medication.  2.  PACs/nonsustained VT: Minimally symptomatic.  No changes at this time.  3.   Coronary artery disease status post CABG: No current chest pain.   COVID 19 screen The patient denies symptoms of COVID 19 at this time.  The importance of social distancing was discussed today.  Follow-up:  3 months  Current medicines are reviewed at length with the patient today.   The patient does not have concerns regarding his medicines.  The following changes were made today:  none  Labs/ tests ordered today include:  No orders of the defined types were placed in this encounter.    Patient Risk:  after full review of this patients clinical status, I feel that they are at moderate risk at this time.  Today, I have spent 20 minutes with the patient with telehealth technology discussing AF .    Signed, Elese Rane Meredith Leeds, MD  05/21/2019 3:00 PM     [place] [address] [phone] (office) [phone] (fax)

## 2019-05-25 ENCOUNTER — Telehealth: Payer: Self-pay | Admitting: Internal Medicine

## 2019-05-25 ENCOUNTER — Encounter: Payer: Self-pay | Admitting: Cardiology

## 2019-05-25 NOTE — Telephone Encounter (Signed)
error 

## 2019-05-25 NOTE — Telephone Encounter (Signed)
Based on his CHADSVASC score, I would recommend staying on Eliquis with only one fall -that does not seem like life-threatening bleeding. Risk of stroke >> then risk of bleeding.  Dr Lemmie Evens

## 2019-05-25 NOTE — Telephone Encounter (Signed)
Returned the call to the patient's wife. She stated that the patient fell a while back and Eliquis was held. The patient's wife does not want the patient to be back on Eliquis but would rather he take Aspirin 81 mg instead.  This was a discussion that they had with Dr. Curt Bears at the office visit on 3/4 but the patient's wife stated she would also like to have Dr. Lysbeth Penner opinion on whether or not he needs to be on Eliquis.

## 2019-05-25 NOTE — Telephone Encounter (Signed)
° °  Pt c/o medication issue:  1. Name of Medication: apixaban (ELIQUIS) 5 MG TABS tablet  2. How are you currently taking this medication (dosage and times per day)? As written  3. Are you having a reaction (difficulty breathing--STAT)?   4. What is your medication issue? Pt's wife called and wanted to asked Dr. Debara Pickett if pt can stop taking Eliquis and switch it to aspirin. She also wanted to know Pt's heart monitor results in 2019 if pt shows he had afib.  Please call.

## 2019-05-26 ENCOUNTER — Other Ambulatory Visit: Payer: Self-pay

## 2019-05-26 ENCOUNTER — Ambulatory Visit: Payer: Medicare Other | Admitting: Pulmonary Disease

## 2019-05-26 ENCOUNTER — Encounter: Payer: Self-pay | Admitting: Pulmonary Disease

## 2019-05-26 DIAGNOSIS — J449 Chronic obstructive pulmonary disease, unspecified: Secondary | ICD-10-CM

## 2019-05-26 DIAGNOSIS — I2510 Atherosclerotic heart disease of native coronary artery without angina pectoris: Secondary | ICD-10-CM | POA: Diagnosis not present

## 2019-05-26 NOTE — Assessment & Plan Note (Addendum)
Paroxysmal atrial fibrillation -ongoing discussion with EP about need for Eliquis CHA2DS2-VASc score is high

## 2019-05-26 NOTE — Telephone Encounter (Signed)
Relayed to patient Dr. Lysbeth Penner medical recommendation (which he also reports was Dr. Curt Bears recommendation as well). He is leery of taking Eliquis due to a fall where he had bruising on his face. He reports his HR is good and he will check that and his BP. Explained that AFib does not always result in a fast HR and that he can be in AFib and not know it. Reiterated Dr. Lysbeth Penner recommendation again

## 2019-05-26 NOTE — Progress Notes (Signed)
   Subjective:    Patient ID: [name], male    DOB: 1940/04/25, 79 y.o.   MRN: [id number]  HPI  57 yo ex-smoker with COPD He is a retired Teacher, [employer] for [employer]    03/2018 last flare >> given doxycycline and prednisone, he had a lingering cough, CT chest 05/2018 showed right lower lobe multifocal pneumonia Follow-up imaging in 08/2018 showed that this had resolved with platelike atelectasis -I personally reviewed these images  Last seen 08/2018 He has done well over the past 6 months, no cough or wheezing, compliant with Symbicort however his wife feels that he is not able to inhale medication very well Wonders if he needs Symbicort anymore, denies seasonal allergies  He had an accident where he walked into a truck while walking his dog and had head injury-on Eliquis  Significant tests/ events reviewed   PFT 06/2016: FVC 3.03 L (87%) FEV1 1.61 L (65%) FEV1/FVC 0.53  positive bronchodilator response 02/2016: FVC 2.74 L (78%) FEV1 1.49 L (60%) FEV1/FVC 0.54  positive bronchodilator response 12/2015: FVC 3.23 L (92%) FEV1 1.47 L (58%) FEV1/FVC 0.45 TLC 8.31 L (133%) RV 203% ERV 218% DLCO uncorrected 45%   LABS 02/03/16 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 Pleasant, well-nourished, in no distress ENT - no thrush, no pallor/icterus,no post nasal drip, "raccoon eyes" Neck: No JVD, no thyromegaly, no carotid bruits Lungs: no use of accessory muscles, no dullness to percussion, clear without rales or rhonchi  Cardiovascular: Rhythm regular, heart sounds  normal, no murmurs or gallops, no peripheral edema Musculoskeletal: No  deformities, no cyanosis or clubbing        Assessment & Plan:

## 2019-05-26 NOTE — Patient Instructions (Signed)
Spacer for symbicort

## 2019-05-26 NOTE — Assessment & Plan Note (Signed)
Okay to stop Singulair and see how he is doing. We will stepdown therapy Continue on Symbicort for now-we will provide him with spacer since his MDI technique is poor.  Give him alternative Breo but he would rather stick to Symbicort

## 2019-05-27 ENCOUNTER — Telehealth: Payer: Self-pay | Admitting: Pulmonary Disease

## 2019-05-27 MED ORDER — ALBUTEROL SULFATE HFA 108 (90 BASE) MCG/ACT IN AERS: 2.0000 | INHALATION_SPRAY | Freq: Four times a day (QID) | RESPIRATORY_TRACT | 2 refills | Status: DC | PRN

## 2019-05-27 NOTE — Telephone Encounter (Signed)
I sent Rx for Albuterol HFA to [facility] as requested. Nothing further is needed.

## 2019-06-05 ENCOUNTER — Other Ambulatory Visit: Payer: Self-pay | Admitting: Pulmonary Disease

## 2019-06-05 DIAGNOSIS — J449 Chronic obstructive pulmonary disease, unspecified: Secondary | ICD-10-CM

## 2019-06-25 DIAGNOSIS — N130 Hydronephrosis with ureteropelvic junction obstruction: Secondary | ICD-10-CM | POA: Diagnosis not present

## 2019-06-25 DIAGNOSIS — R1084 Generalized abdominal pain: Secondary | ICD-10-CM | POA: Diagnosis not present

## 2019-06-30 DIAGNOSIS — N130 Hydronephrosis with ureteropelvic junction obstruction: Secondary | ICD-10-CM | POA: Diagnosis not present

## 2019-07-20 ENCOUNTER — Encounter: Payer: Self-pay | Admitting: Cardiology

## 2019-07-20 NOTE — Telephone Encounter (Signed)
error 

## 2019-07-31 DIAGNOSIS — N1330 Unspecified hydronephrosis: Secondary | ICD-10-CM | POA: Diagnosis not present

## 2019-08-06 DIAGNOSIS — M21612 Bunion of left foot: Secondary | ICD-10-CM | POA: Diagnosis not present

## 2019-08-06 DIAGNOSIS — M67471 Ganglion, right ankle and foot: Secondary | ICD-10-CM | POA: Diagnosis not present

## 2019-08-06 DIAGNOSIS — R234 Changes in skin texture: Secondary | ICD-10-CM | POA: Diagnosis not present

## 2019-08-06 DIAGNOSIS — E1351 Other specified diabetes mellitus with diabetic peripheral angiopathy without gangrene: Secondary | ICD-10-CM | POA: Diagnosis not present

## 2019-08-13 DIAGNOSIS — M5432 Sciatica, left side: Secondary | ICD-10-CM | POA: Diagnosis not present

## 2019-08-21 DIAGNOSIS — I10 Essential (primary) hypertension: Secondary | ICD-10-CM | POA: Diagnosis not present

## 2019-08-24 DIAGNOSIS — M545 Low back pain: Secondary | ICD-10-CM | POA: Diagnosis not present

## 2019-08-28 DIAGNOSIS — Z0001 Encounter for general adult medical examination with abnormal findings: Secondary | ICD-10-CM | POA: Diagnosis not present

## 2019-08-28 DIAGNOSIS — I2510 Atherosclerotic heart disease of native coronary artery without angina pectoris: Secondary | ICD-10-CM | POA: Diagnosis not present

## 2019-08-28 DIAGNOSIS — I10 Essential (primary) hypertension: Secondary | ICD-10-CM | POA: Diagnosis not present

## 2019-08-28 DIAGNOSIS — E119 Type 2 diabetes mellitus without complications: Secondary | ICD-10-CM | POA: Diagnosis not present

## 2019-09-04 ENCOUNTER — Other Ambulatory Visit: Payer: Self-pay

## 2019-09-04 ENCOUNTER — Encounter: Payer: Self-pay | Admitting: Family Medicine

## 2019-09-04 ENCOUNTER — Ambulatory Visit: Payer: Medicare Other | Admitting: Nurse Practitioner

## 2019-09-04 ENCOUNTER — Ambulatory Visit (INDEPENDENT_AMBULATORY_CARE_PROVIDER_SITE_OTHER): Payer: Medicare Other

## 2019-09-04 ENCOUNTER — Encounter: Payer: Self-pay | Admitting: Nurse Practitioner

## 2019-09-04 ENCOUNTER — Ambulatory Visit: Payer: Medicare Other | Admitting: Family Medicine

## 2019-09-04 VITALS — BP 108/64 | HR 64 | Ht 66.0 in | Wt 145.6 lb

## 2019-09-04 VITALS — BP 120/72 | HR 75 | Ht 66.0 in | Wt 144.8 lb

## 2019-09-04 DIAGNOSIS — I480 Paroxysmal atrial fibrillation: Secondary | ICD-10-CM | POA: Diagnosis not present

## 2019-09-04 DIAGNOSIS — M5432 Sciatica, left side: Secondary | ICD-10-CM | POA: Diagnosis not present

## 2019-09-04 DIAGNOSIS — M160 Bilateral primary osteoarthritis of hip: Secondary | ICD-10-CM | POA: Diagnosis not present

## 2019-09-04 DIAGNOSIS — Z951 Presence of aortocoronary bypass graft: Secondary | ICD-10-CM

## 2019-09-04 DIAGNOSIS — C61 Malignant neoplasm of prostate: Secondary | ICD-10-CM | POA: Diagnosis not present

## 2019-09-04 DIAGNOSIS — M5136 Other intervertebral disc degeneration, lumbar region: Secondary | ICD-10-CM | POA: Diagnosis not present

## 2019-09-04 MED ORDER — GABAPENTIN 300 MG PO CAPS: 300.0000 mg | ORAL_CAPSULE | Freq: Three times a day (TID) | ORAL | 2 refills | Status: DC | PRN

## 2019-09-04 MED ORDER — PREDNISONE 10 MG PO TABS: 30.0000 mg | ORAL_TABLET | Freq: Every day | ORAL | 0 refills | Status: DC

## 2019-09-04 NOTE — Patient Instructions (Signed)
Medication Instructions:  *If you need a refill on your cardiac medications before your next appointment, please call your pharmacy*  Lab Work: If you have labs (blood work) drawn today and your tests are completely normal, you will receive your results only by: [name] MyChart Message (if you have MyChart) OR . A paper copy in the mail If you have any lab test that is abnormal or we need to change your treatment, we will call you to review the results.  Testing/Procedures: None Ordered  Follow-Up: At [facility], you and your health needs are our priority.  As part of our continuing mission to provide you with exceptional heart care, we have created designated Provider Care Teams.  These Care Teams include your primary Cardiologist (physician) and Advanced Practice Providers (APPs -  Physician Assistants and Nurse Practitioners) who all work together to provide you with the care you need, when you need it.  We recommend signing up for the patient portal called "MyChart".  Sign up information is provided on this After Visit Summary.  MyChart is used to connect with patients for Virtual Visits (Telemedicine).  Patients are able to view lab/test results, encounter notes, upcoming appointments, etc.  Non-urgent messages can be sent to your provider as well.   To learn more about what you can do with MyChart, go to [email or web address].    Your next appointment:   Your physician wants you to follow-up in: 45 MONTHS with Dr. Curt Bears. You will receive a reminder letter in the mail two months in advance. If you don't receive a letter, please call our office to schedule the follow-up appointment.  The format for your next appointment:   In Person  Provider:   Allegra Lai, MD

## 2019-09-04 NOTE — Progress Notes (Signed)
Electrophysiology Office Note Date: 09/04/2019  ID:  [name], DOB [birth year], MRN [id number]  PCP: [name], MD Electrophysiologist: Curt Bears   CC: AF follow up  [name] is a 79 y.o. male seen today for Dr Curt Bears.  He presents today for routine electrophysiology followup.  Since last being seen in our clinic, the patient reports doing very well. He is currently struggling with sciatica.  He denies chest pain, palpitations, dyspnea, PND, orthopnea, nausea, vomiting, dizziness, syncope, edema, weight gain, or early satiety.  Past Medical History:  Diagnosis Date  . Abnormal finding on lung imaging 05/14/2018   04/09/2018-chest x-ray-ill-defined peripheral right midlung airspace opacity possibly early pneumonia,  >>>follow-up PA lateral chest x-ray is recommended in 3 to 4 weeks following trial of antibiotic therapy to ensure resolution and exclude underlying malignancy  05/14/2018- chest x-ray-persistent ill-defined right lung interstitial and airspace process possible persistent bronchopneumonia, chest C  . Asthma   . 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 CAD (coronary artery disease)    cath & CABG in 2008  . Cardiomyopathy, 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  . COPD (chronic obstructive pulmonary disease) ([place])   . COPD with acute exacerbation ([place]) 04/02/2018  . Diabetes ([place])    type 2  . DM2 (diabetes mellitus, type 2) ([place]) 02/05/2013  . Dyslipidemia   . Environmental allergies 02/03/2016   02/03/2016-respiratory allergy panel, environmental allergens to dust mites, cat  dander, dog dander, Timothy grass, cockroach, fungus, cedar trees, ragweed, rough pigweed, mouse urine, IgE is 689 >>>Largest elevations are to cat dander, dog dander, dust mites   . Essential hypertension 02/03/2016  . Former smoker 05/14/2018   Former smoker Quit 2008 43-pack-year smoking history  . History of nuclear stress test 10/08/2007   bruce myoview; normal scan with attenuation artifact; no ischemia/infarct; low risk   . Hypertriglyceridemia 02/03/2016  . 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 Prostate cancer ([place])   . S/P CABG (coronary artery bypass graft) 2008   post-op AF with no recurrence   . 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 [name] - LIMA to LAD, sequential saphenous vein graft to OM1 and OM 2, SVG to posterior descending (done emergently)    Past Surgical History:  Procedure Laterality Date  . CARDIAC CATHETERIZATION  2003   total occlusion of LAD with left to left collaterals, mod disease of RCA & mild disease in Cfx (Dr. Rockne Menghini)  . Carotid Doppler  06/2010   stenosis of ICA less than 50%   . CORONARY ARTERY BYPASS GRAFT  05/31/2006   LIMA to LAD, SVG to OM1 & OM2, SVG to PDA (Dr. Prescott Gum)  . NASAL SEPTOPLASTY W/ TURBINOPLASTY  1970s  . PROSTATE BIOPSY    . TONSILLECTOMY      Current Outpatient Medications  Medication Sig Dispense Refill  . albuterol (VENTOLIN HFA) 108 (90 Base) MCG/ACT inhaler Inhale 2 puffs into the lungs every 6 (six) hours as needed for wheezing or shortness of breath. 8 g 2  . budesonide-formoterol (SYMBICORT) 80-4.5 MCG/ACT inhaler Inhale 2 puffs into the lungs in the morning and at bedtime. 10.2 g 3  . Cyanocobalamin (VITAMIN B-12  SL) Place 1 tablet under the tongue daily.    [name] diltiazem (CARDIZEM CD) 180 MG 24 hr capsule Take 1 capsule (180 mg total) by mouth daily. 30 capsule 11  . folic acid (FOLVITE) 1 MG tablet Take 1 mg by mouth daily.    [name] gabapentin (NEURONTIN) 300 MG capsule Take 300 mg by mouth 3 (three) times daily.    [name] losartan  (COZAAR) 25 MG tablet Take 25 mg by mouth daily. as directed  1  . metFORMIN (GLUCOPHAGE) 500 MG tablet Take 500 mg by mouth 2 (two) times daily with a meal.     . nitroGLYCERIN (NITROSTAT) 0.4 MG SL tablet Place 0.4 mg under the tongue every 5 (five) minutes x 3 doses as needed.    . Omega-3 Fatty Acids (FISH OIL PO) Take 1 capsule by mouth at bedtime.    . pravastatin (PRAVACHOL) 40 MG tablet Take 1 tablet (40 mg total) by mouth daily. 90 tablet 3   No current facility-administered medications for this visit.    Allergies:   Patient has no known allergies.   Social History: Social History   Socioeconomic History  . Marital status: Married    Spouse name: Not on file  . Number of children: Not on file  . Years of education: Not on file  . Highest education level: Not on file  Occupational History  . Not on file  Tobacco Use  . Smoking status: Former Smoker    Packs/day: 1.00    Years: 43.00    Pack years: 43.00    Types: Cigarettes    Start date: 12/25/1963    Quit date: 01/31/2007    Years since quitting: 12.6  . Smokeless tobacco: Never Used  Substance and Sexual Activity  . Alcohol use: Yes    Comment: few drinks a month  . Drug use: No  . Sexual activity: Not on file  Other Topics Concern  . Not on file  Social History Narrative   Originally from [place], [place]. Moved to [place] in 1968. 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 He grew up in [place], [place]. Currently has a dog. No bird or mold exposure.    Social Determinants of Health   Financial Resource Strain:   . Difficulty of Paying Living Expenses:   Food Insecurity:   . Worried About Charity fundraiser in the Last Year:   . Arboriculturist in the Last Year:   Transportation Needs:   . Film/video editor (Medical):   [name] Lack of Transportation (Non-Medical):   Physical Activity:   . Days of Exercise per Week:   . Minutes of Exercise per Session:   Stress:     . Feeling of Stress :   Social Connections:   . Frequency of Communication with Friends and Family:   . Frequency of Social Gatherings with Friends and Family:   . Attends Religious Services:   . Active Member of Clubs or Organizations:   . Attends Archivist Meetings:   [name] Marital Status:   Intimate Partner Violence:   . Fear of Current or Ex-Partner:   . Emotionally Abused:   [name] Physically Abused:   . Sexually Abused:     Family History: Family History  Problem Relation Age of Onset  . Diabetes Mother        died of CVA at 39, also HTN  . Alzheimer's disease  Father   . CAD Father   . Heart attack Father 66       also HTN  . Stroke Maternal Grandmother   . Lung disease Neg Hx     Review of Systems: All other systems reviewed and are otherwise negative except as noted above.   Physical Exam: VS:  BP 108/64   Pulse 64   Ht 5\' 6"  (1.676 m)   Wt 145 lb 9.6 oz (66 kg)   SpO2 97%   BMI 23.50 kg/m  , BMI Body mass index is 23.5 kg/m. Wt Readings from Last 3 Encounters:  09/04/19 145 lb 9.6 oz (66 kg)  05/26/19 145 lb 3.2 oz (65.9 kg)  03/05/19 148 lb (67.1 kg)    GEN- The patient is well appearing, alert and oriented x 3 today.   HEENT: normocephalic, atraumatic; sclera clear, conjunctiva pink; hearing intact; oropharynx clear; neck supple  Lungs- Clear to ausculation bilaterally, normal work of breathing.  No wheezes, rales, rhonchi Heart- Regular rate and rhythm  GI- soft, non-tender, non-distended, bowel sounds present  Extremities- no clubbing, cyanosis, or edema  MS- no significant deformity or atrophy Skin- warm and dry, no rash or lesion  Psych- euthymic mood, full affect Neuro- strength and sensation are intact   EKG:  EKG is ordered today. The ekg ordered today shows SR, normal intervals, rate 64  Recent Labs: No results found for requested labs within last 8760 hours.    Other studies Reviewed: Additional studies/ records that were  reviewed today include: Dr Curt Bears office notes   Assessment and Plan:  1.  Paroxysmal atrial fibrillation Maintaining SR by symptoms CHADS2VASC is 4 - he and Dr Curt Bears decided through a shared decision making process to stop Izard with significant bruising. He is currently taking ASA 81mg  daily. He would be a reasonable candidate for Watchman, will send his name over for review of chart.   2.  CAD s/p CABG No recent ischemic symptoms Continue medical therapy   Current medicines are reviewed at length with the patient today.   The patient does not have concerns regarding his medicines.  The following changes were made today:  none  Labs/ tests ordered today include: none No orders of the defined types were placed in this encounter.    Disposition:   Follow up with Dr Curt Bears 6 months     Signed, Chanetta Marshall, NP 09/04/2019 10:36 AM   [facility] [address] [place] [address] [phone] (office) [phone] (fax)

## 2019-09-04 NOTE — Patient Instructions (Addendum)
Thank you for coming in today. Plan for xray today and prednisone.  Continue gabapentin.  If not improved in 1-2 weeks let me know and I will proceed to lumbar MRI.   Keep me updated.    Sciatica  Sciatica is pain, numbness, weakness, or tingling along the path of the sciatic nerve. The sciatic nerve starts in the lower back and runs down the back of each leg. The nerve controls the muscles in the lower leg and in the back of the knee. It also provides feeling (sensation) to the back of the thigh, the lower leg, and the sole of the foot. Sciatica is a symptom of another medical condition that pinches or puts pressure on the sciatic nerve. Sciatica most often only affects one side of the body. Sciatica usually goes away on its own or with treatment. In some cases, sciatica may come back (recur). What are the causes? This condition is caused by pressure on the sciatic nerve or pinching of the nerve. This may be the result of:  A disk in between the bones of the spine bulging out too far (herniated disk).  Age-related changes in the spinal disks.  A pain disorder that affects a muscle in the buttock.  Extra bone growth near the sciatic nerve.  A break (fracture) of the pelvis.  Pregnancy.  Tumor. This is rare. What increases the risk? The following factors may make you more likely to develop this condition:  Playing sports that place pressure or stress on the spine.  Having poor strength and flexibility.  A history of back injury or surgery.  Sitting for long periods of time.  Doing activities that involve repetitive bending or lifting.  Obesity. What are the signs or symptoms? Symptoms can vary from mild to very severe, and they may include:  Any of these problems in the lower back, leg, hip, or buttock: ? Mild tingling, numbness, or dull aches. ? Burning sensations. ? Sharp pains.  Numbness in the back of the calf or the sole of the foot.  Leg weakness.  Severe  back pain that makes movement difficult. Symptoms may get worse when you cough, sneeze, or laugh, or when you sit or stand for long periods of time. How is this diagnosed? This condition may be diagnosed based on:  Your symptoms and medical history.  A physical exam.  Blood tests.  Imaging tests, such as: ? X-rays. ? MRI. ? CT scan. How is this treated? In many cases, this condition improves on its own without treatment. However, treatment may include:  Reducing or modifying physical activity.  Exercising and stretching.  Icing and applying heat to the affected area.  Medicines that help to: ? Relieve pain and swelling. ? Relax your muscles.  Injections of medicines that help to relieve pain, irritation, and inflammation around the sciatic nerve (steroids).  Surgery. Follow these instructions at home: Medicines  Take over-the-counter and prescription medicines only as told by your health care provider.  Ask your health care provider if the medicine prescribed to you: ? Requires you to avoid driving or using heavy machinery. ? Can cause constipation. You may need to take these actions to prevent or treat constipation:  Drink enough fluid to keep your urine pale yellow.  Take over-the-counter or prescription medicines.  Eat foods that are high in fiber, such as beans, whole grains, and fresh fruits and vegetables.  Limit foods that are high in fat and processed sugars, such as fried or sweet foods.  Managing pain      If directed, put ice on the affected area. ? Put ice in a plastic bag. ? Place a towel between your skin and the bag. ? Leave the ice on for 20 minutes, 2-3 times a day.  If directed, apply heat to the affected area. Use the heat source that your health care provider recommends, such as a moist heat pack or a heating pad. ? Place a towel between your skin and the heat source. ? Leave the heat on for 20-30 minutes. ? Remove the heat if your skin  turns bright red. This is especially important if you are unable to feel pain, heat, or cold. You may have a greater risk of getting burned. Activity   Return to your normal activities as told by your health care provider. Ask your health care provider what activities are safe for you.  Avoid activities that make your symptoms worse.  Take brief periods of rest throughout the day. ? When you rest for longer periods, mix in some mild activity or stretching between periods of rest. This will help to prevent stiffness and pain. ? Avoid sitting for long periods of time without moving. Get up and move around at least one time each hour.  Exercise and stretch regularly, as told by your health care provider.  Do not lift anything that is heavier than 10 lb (4.5 kg) while you have symptoms of sciatica. When you do not have symptoms, you should still avoid heavy lifting, especially repetitive heavy lifting.  When you lift objects, always use proper lifting technique, which includes: ? Bending your knees. ? Keeping the load close to your body. ? Avoiding twisting. General instructions  Maintain a healthy weight. Excess weight puts extra stress on your back.  Wear supportive, comfortable shoes. Avoid wearing high heels.  Avoid sleeping on a mattress that is too soft or too hard. A mattress that is firm enough to support your back when you sleep may help to reduce your pain.  Keep all follow-up visits as told by your health care provider. This is important. Contact a health care provider if:  You have pain that: ? Wakes you up when you are sleeping. ? Gets worse when you lie down. ? Is worse than you have experienced in the past. ? Lasts longer than 4 weeks.  You have an unexplained weight loss. Get help right away if:  You are not able to control when you urinate or have bowel movements (incontinence).  You have: ? Weakness in your lower back, pelvis, buttocks, or legs that gets  worse. ? Redness or swelling of your back. ? A burning sensation when you urinate. Summary  Sciatica is pain, numbness, weakness, or tingling along the path of the sciatic nerve.  This condition is caused by pressure on the sciatic nerve or pinching of the nerve.  Sciatica can cause pain, numbness, or tingling in the lower back, legs, hips, and buttocks.  Treatment often includes rest, exercise, medicines, and applying ice or heat. This information is not intended to replace advice given to you by your health care provider. Make sure you discuss any questions you have with your health care provider. Document Revised: 03/24/2018 Document Reviewed: 03/24/2018 Elsevier Patient Education  [place].

## 2019-09-04 NOTE — Progress Notes (Signed)
Subjective:    CC: Low back w/ L sided sciatica  I, Molly Weber, LAT, ATC, am serving as scribe for Dr. Lynne Leader.  HPI: Pt is an 79 y/o male presenting w/ c/o L buttock pain that radiates/shoots into his L leg down to his calf x approximately one month.  Pain occurs in buttocks and left lateral calf.  No weakness or numbness.  Low back pain: intermittently yes Radiating pain: yes from L buttocks to L calf L LE numbness/tingling: plantar surface of both feet L LE weakness: No Aggravating factors: knee and hip flexion; ascending stairs Treatments tried: PT but felt worse after so did not con't; Gabapentin 300 mg twice daily which helps some.  Pertinent review of Systems: No fevers or chills  Relevant historical information: Pertinent for prostate cancer with watchful waiting via urology.  PSA stable recently.  Also pertinent for diabetes COPD hypertension and cardiomyopathy.   Objective:    Vitals:   09/04/19 1432  BP: 120/72  Pulse: 75  SpO2: 97%   General: Well Developed, well nourished, and in no acute distress.   MSK: L-spine normal-appearing nontender normal lumbar motion. Negative straight leg raise test bilaterally. Strength intact lower extremities except noted below. Reflexes equal bilateral extremities as is sensation.  Left hip normal-appearing Nontender greater trochanter. Hip abduction strength is significantly diminished at 3+/5  External rotation strength also is diminished 3+/5 Not able to reproduce symptoms by pressure overlying sciatic nerve at piriformis.   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  No acute fractures.  Abdominal aortic atherosclerosis present.  Left hip: Mild degenerative changes.  And dizzy optic changes the greater trochanter.  No acute fractures.  Await formal radiology  review  Impression and Recommendations:    Assessment and Plan: 79 y.o. male with left buttocks pain and left lateral calf pain.  Concerning for S1 or L5 radiculopathy.  Patient certainly does have degenerative changes in his lumbar spine.  Additional diagnosis could be piriformis syndrome although he worsened with physical therapy which is not typical for this condition.  He is already taking gabapentin which is reasonable.  We will prescribe short course of prednisone which should be helpful.  We will proceed with lumbar MRI if not improving in a few weeks as he is already had a 4-week trial of conservative management with little benefit.[name]  PDMP not reviewed this encounter. Orders Placed This Encounter  Procedures  . DG Lumbar Spine 2-3 Views    Standing Status:   Future    Number of Occurrences:   1    Standing Expiration Date:   09/03/2020    Order Specific Question:   Reason for Exam (SYMPTOM  OR DIAGNOSIS REQUIRED)    Answer:   eval left sciatica?    Order Specific Question:   Preferred imaging location?    Answer:   [facility]    Order Specific Question:   Radiology Contrast Protocol - do NOT remove file path    Answer:   \\[email or web address]  . DG HIP UNILAT WITH PELVIS 2-3 VIEWS LEFT    Standing Status:   Future    Number of Occurrences:   1    Standing Expiration Date:   09/03/2020    Order Specific Question:   Reason for Exam (SYMPTOM  OR DIAGNOSIS REQUIRED)    Answer:   eval left hip  pain    Order Specific Question:   Preferred imaging location?    Answer:   [facility]    Order Specific Question:   Radiology Contrast Protocol - do NOT remove file path    Answer:   \\[email or web address]   Meds ordered this encounter  Medications  . predniSONE (DELTASONE) 10 MG tablet    Sig: Take 3 tablets (30 mg total) by mouth daily with breakfast.    Dispense:  15 tablet    Refill:  0  .  gabapentin (NEURONTIN) 300 MG capsule    Sig: Take 1 capsule (300 mg total) by mouth 3 (three) times daily as needed (nerve pain).    Dispense:  90 capsule    Refill:  2    Discussed warning signs or symptoms. Please see discharge instructions. Patient expresses understanding.   The above documentation has been reviewed and is accurate and complete Lynne Leader, M.D.

## 2019-09-07 ENCOUNTER — Telehealth: Payer: Self-pay | Admitting: Family Medicine

## 2019-09-07 DIAGNOSIS — M5432 Sciatica, left side: Secondary | ICD-10-CM

## 2019-09-07 DIAGNOSIS — C61 Malignant neoplasm of prostate: Secondary | ICD-10-CM

## 2019-09-07 MED ORDER — PREDNISONE 10 MG PO TABS: 30.0000 mg | ORAL_TABLET | Freq: Every day | ORAL | 0 refills | Status: DC

## 2019-09-07 NOTE — Progress Notes (Signed)
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

## 2019-09-07 NOTE — Telephone Encounter (Signed)
Sent prednisone in again

## 2019-09-07 NOTE — Progress Notes (Signed)
X-ray lumbar spine shows multilevel arthritis

## 2019-10-20 ENCOUNTER — Ambulatory Visit: Payer: Medicare Other | Admitting: Podiatry

## 2019-10-20 ENCOUNTER — Encounter: Payer: Self-pay | Admitting: Podiatry

## 2019-10-20 ENCOUNTER — Other Ambulatory Visit: Payer: Self-pay

## 2019-10-20 VITALS — Temp 97.3°F

## 2019-10-20 DIAGNOSIS — L309 Dermatitis, unspecified: Secondary | ICD-10-CM

## 2019-10-20 DIAGNOSIS — R234 Changes in skin texture: Secondary | ICD-10-CM | POA: Diagnosis not present

## 2019-10-20 DIAGNOSIS — B353 Tinea pedis: Secondary | ICD-10-CM

## 2019-10-20 DIAGNOSIS — L858 Other specified epidermal thickening: Secondary | ICD-10-CM | POA: Diagnosis not present

## 2019-10-20 DIAGNOSIS — R238 Other skin changes: Secondary | ICD-10-CM | POA: Diagnosis not present

## 2019-10-20 MED ORDER — KETOCONAZOLE 2 % EX CREA
1.0000 | TOPICAL_CREAM | Freq: Two times a day (BID) | CUTANEOUS | 2 refills | Status: DC
Start: 2019-10-20 — End: 2019-11-10

## 2019-10-20 NOTE — Addendum Note (Signed)
Addended by: Celene Skeen A on: 10/20/2019 06:02 PM   Modules accepted: Orders

## 2019-10-20 NOTE — Progress Notes (Signed)
  Subjective:  Patient ID: [name], male    DOB: 1940/11/17,  MRN: [id number]  Chief Complaint  Patient presents with  . Skin Issue    Right foot; heel-medial side; bottom of heel; Hallux-medial side; pt stated, "I get numbness in both feet"; pt Diabetic Type 2; Sugar=did not check today; A1C=pt did not know-not in chart    79 y.o. male presents with the above complaint. History confirmed with patient.  Has seen another podiatrist in the area before and has tried many creams.  It seems if they are likely a urea-based cream  Objective:  Physical Exam: warm, good capillary refill, no trophic changes or ulcerative lesions, normal DP and PT pulses and normal sensory exam. Left Foot: Erythematous dry scaling plaque on the medial ankle just proximal to the arch, no bruising or vesicular formation nontender  Assessment:   1. Tinea pedis of right foot   2. Dermatitis      Plan:  Patient was evaluated and treated and all questions answered.  -The rash she has on the inside of the ankle appears to be a form of tinea pedis or potentially a inflammatory dermatitis -Skin scraping was taken and sent to [facility] for evaluation of this -I recommended we treat empirically with ketoconazole cream for now.  If he does not improve with this we will try topical steroid. I discussed the rationale of not trying a steroid now with him as this could worsen a tinea pedis if this is what this is  Return in about 1 month (around 11/20/2019) for re-check skin on ankle.   Lanae Crumbly, DPM 10/20/2019

## 2019-10-20 NOTE — Patient Instructions (Signed)
Diabetes Mellitus and Foot Care Foot care is an important part of your health, especially when you have diabetes. Diabetes may cause you to have problems because of poor blood flow (circulation) to your feet and legs, which can cause your skin to:  Become thinner and drier.  Break more easily.  Heal more slowly.  Peel and crack. You may also have nerve damage (neuropathy) in your legs and feet, causing decreased feeling in them. This means that you may not notice minor injuries to your feet that could lead to more serious problems. Noticing and addressing any potential problems early is the best way to prevent future foot problems. How to care for your feet Foot hygiene  Wash your feet daily with warm water and mild soap. Do not use hot water. Then, pat your feet and the areas between your toes until they are completely dry. Do not soak your feet as this can dry your skin.  Trim your toenails straight across. Do not dig under them or around the cuticle. File the edges of your nails with an emery board or nail file.  Apply a moisturizing lotion or petroleum jelly to the skin on your feet and to dry, brittle toenails. Use lotion that does not contain alcohol and is unscented. Do not apply lotion between your toes. Shoes and socks  Wear clean socks or stockings every day. Make sure they are not too tight. Do not wear knee-high stockings since they may decrease blood flow to your legs.  Wear shoes that fit properly and have enough cushioning. Always look in your shoes before you put them on to be sure there are no objects inside.  To break in new shoes, wear them for just a few hours a day. This prevents injuries on your feet. Wounds, scrapes, corns, and calluses  Check your feet daily for blisters, cuts, bruises, sores, and redness. If you cannot see the bottom of your feet, use a mirror or ask someone for help.  Do not cut corns or calluses or try to remove them with medicine.  If you  find a minor scrape, cut, or break in the skin on your feet, keep it and the skin around it clean and dry. You may clean these areas with mild soap and water. Do not clean the area with peroxide, alcohol, or iodine.  If you have a wound, scrape, corn, or callus on your foot, look at it several times a day to make sure it is healing and not infected. Check for: ? Redness, swelling, or pain. ? Fluid or blood. ? Warmth. ? Pus or a bad smell. General instructions  Do not cross your legs. This may decrease blood flow to your feet.  Do not use heating pads or hot water bottles on your feet. They may burn your skin. If you have lost feeling in your feet or legs, you may not know this is happening until it is too late.  Protect your feet from hot and cold by wearing shoes, such as at the beach or on hot pavement.  Schedule a complete foot exam at least once a year (annually) or more often if you have foot problems. If you have foot problems, report any cuts, sores, or bruises to your health care provider immediately. Contact a health care provider if:  You have a medical condition that increases your risk of infection and you have any cuts, sores, or bruises on your feet.  You have an injury that is not   healing.  You have redness on your legs or feet.  You feel burning or tingling in your legs or feet.  You have pain or cramps in your legs and feet.  Your legs or feet are numb.  Your feet always feel cold.  You have pain around a toenail. Get help right away if:  You have a wound, scrape, corn, or callus on your foot and: ? You have pain, swelling, or redness that gets worse. ? You have fluid or blood coming from the wound, scrape, corn, or callus. ? Your wound, scrape, corn, or callus feels warm to the touch. ? You have pus or a bad smell coming from the wound, scrape, corn, or callus. ? You have a fever. ? You have a red line going up your leg. Summary  Check your feet every day  for cuts, sores, red spots, swelling, and blisters.  Moisturize feet and legs daily.  Wear shoes that fit properly and have enough cushioning.  If you have foot problems, report any cuts, sores, or bruises to your health care provider immediately.  Schedule a complete foot exam at least once a year (annually) or more often if you have foot problems. This information is not intended to replace advice given to you by your health care provider. Make sure you discuss any questions you have with your health care provider. Document Revised: 11/26/2018 Document Reviewed: 04/06/2016 Elsevier Patient Education  2020 [employer]  

## 2019-10-27 ENCOUNTER — Telehealth: Payer: Self-pay | Admitting: *Deleted

## 2019-10-27 NOTE — Telephone Encounter (Signed)
Pt states he has more question about his appt.

## 2019-10-27 NOTE — Telephone Encounter (Signed)
I spoke with pt's wife, nurse Hassan Rowan, she asked for the results of the skin scraping. I told her I would call for the results, to continue to keep skin clean and dry and apply the ketoconazole cream after thoroughly drying feet. Hassan Rowan states pt now has crack on the heel and she was going to soak with epsom salts but decided not to. I told Hassan Rowan to keep the area clean and dry and apply the ketoconazole cream.

## 2019-10-27 NOTE — Telephone Encounter (Signed)
Unable to contact [facility] representative for results, I will call again tomorrow.

## 2019-10-30 DIAGNOSIS — S91319A Laceration without foreign body, unspecified foot, initial encounter: Secondary | ICD-10-CM | POA: Diagnosis not present

## 2019-11-03 NOTE — Telephone Encounter (Signed)
Called and let patients wife know that results are not in and to see dr. Sherryle Lis on the 24th

## 2019-11-10 ENCOUNTER — Encounter: Payer: Self-pay | Admitting: Podiatry

## 2019-11-10 ENCOUNTER — Other Ambulatory Visit: Payer: Self-pay

## 2019-11-10 ENCOUNTER — Ambulatory Visit: Payer: Medicare Other | Admitting: Podiatry

## 2019-11-10 VITALS — Temp 97.1°F

## 2019-11-10 DIAGNOSIS — R238 Other skin changes: Secondary | ICD-10-CM | POA: Diagnosis not present

## 2019-11-10 DIAGNOSIS — L309 Dermatitis, unspecified: Secondary | ICD-10-CM | POA: Diagnosis not present

## 2019-11-10 DIAGNOSIS — B353 Tinea pedis: Secondary | ICD-10-CM | POA: Diagnosis not present

## 2019-11-10 DIAGNOSIS — L858 Other specified epidermal thickening: Secondary | ICD-10-CM | POA: Diagnosis not present

## 2019-11-10 DIAGNOSIS — R234 Changes in skin texture: Secondary | ICD-10-CM | POA: Diagnosis not present

## 2019-11-10 MED ORDER — BETAMETHASONE DIPROPIONATE 0.05 % EX OINT: TOPICAL_OINTMENT | Freq: Two times a day (BID) | CUTANEOUS | 0 refills | Status: DC

## 2019-11-10 NOTE — Patient Instructions (Addendum)
Apply the diprolene ointment twice daily, stop using the ketoconazole cream.   If the rash worsens after 1 week, stop using the diprolene and return to the ketoconazole cream

## 2019-11-11 ENCOUNTER — Telehealth: Payer: Self-pay | Admitting: Podiatrist

## 2019-11-11 NOTE — Telephone Encounter (Signed)
They can, that's a much lower potency topical vs the diprolene I prescribed. I'd say if he uses the [facility] for a week and doesn't have any improvement that he should pick that up and begin using the diprolene. Thanks!

## 2019-11-11 NOTE — Telephone Encounter (Signed)
Done-  spoke with patients wife and she agrees they will try the hydrocortisone cream for a week and if no improvement will fill the diprolene prescription you called in.  Thanks!

## 2019-11-11 NOTE — Progress Notes (Signed)
  Subjective:  Patient ID: [name], male    DOB: 03/10/1941,  MRN: [id number]  Chief Complaint  Patient presents with  . Foot Pain    follow up for skin recheck, patient has a spot on the bottom of his hell has some drainage on his sock patient stated it hurts on and off     79 y.o. male returns with the above complaint. History confirmed with patient.  He is here for review of his pathology results.  He has been using the ketoconazole cream and has not noticed much improvement.  Objective:  Physical Exam: warm, good capillary refill, no trophic changes or ulcerative lesions, normal DP and PT pulses and normal sensory exam. Left Foot: Erythematous dry scaling plaque on the medial ankle just proximal to the arch, no bruising or vesicular formation, there is a small fissure extending from this in the plantar heel which is painful  Assessment:   1. Dermatitis   2. Tinea pedis of right foot      Plan:  Patient was evaluated and treated and all questions answered.  -He has not had improvement with topical antifungal administration -Unfortunately his skin scraping that was sent to [facility] pathology appears to have been lost by the laboratory.  We spent quite a bit of time on the phone with him today trying to locate the specimen but they are unable to locate it.  We took another skin scraping today without incident and sent this back to the lab. -Considering that he has not had much success with the ketoconazole, I recommended we switch and treat this as an eczematous reaction.  I have prescribed Diprolene ointment.  After the visit his wife called and states that they have quite a bit of hydrocortisone 2.5% cream and would like to use that instead.  I think this is okay to see if he has improvement in the next week.  If he does not have improvement then we will increase to the super high potency Diprolene ointment that I prescribed.  Return in about 1 month (around 12/11/2019).   Lanae Crumbly, DPM 11/11/2019

## 2019-11-11 NOTE — Telephone Encounter (Signed)
Patients wife called asking if he can use Hydrocortisone 2.5 instead of prescription you called in for him as they already have lots of the hydrocortisone cream at home.  Thanks!

## 2019-11-24 DIAGNOSIS — L84 Corns and callosities: Secondary | ICD-10-CM | POA: Diagnosis not present

## 2019-11-24 DIAGNOSIS — L602 Onychogryphosis: Secondary | ICD-10-CM | POA: Diagnosis not present

## 2019-11-26 ENCOUNTER — Ambulatory Visit: Payer: Medicare Other | Admitting: Pulmonary Disease

## 2019-11-26 ENCOUNTER — Ambulatory Visit: Payer: Medicare Other | Admitting: Primary Care

## 2019-11-30 ENCOUNTER — Encounter: Payer: Self-pay | Admitting: Dermatology

## 2019-11-30 ENCOUNTER — Other Ambulatory Visit: Payer: Self-pay

## 2019-11-30 ENCOUNTER — Ambulatory Visit: Payer: Medicare Other | Admitting: Dermatology

## 2019-11-30 DIAGNOSIS — L409 Psoriasis, unspecified: Secondary | ICD-10-CM

## 2019-11-30 DIAGNOSIS — B359 Dermatophytosis, unspecified: Secondary | ICD-10-CM

## 2019-11-30 LAB — POCT SKIN KOH: Skin KOH, POC: NEGATIVE

## 2019-11-30 NOTE — Patient Instructions (Addendum)
Follow-up visit for [name] date of birth [birth year] ([birth year] 78th).  He has been plagued with peeling and cracking and pain on his right heel for months now. he has had several visits to podiatrist who have variably recommended a cream with urea, told him is not a fungus, and is also tried some O'Keefe's foot cream all with variable transient benefit.  Examination showed hyperkeratotic and cracked plaques limited to the right heel.  There is no sign of fungus on either forefoot.  The hands and nails are unaffected.  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  I did obtain a fungal scraping + culture and we will call [name] if the scraping shows fungus later today.  If he does not receive a phone call from me, then nightly he will first soak his right foot in a dish pan of comfortably warm water (he may put a couple of capfuls of any liquid moisturizer like CeraVe or Cetaphil into the water if the water is too drying).  This will be done just before going to sleep.  He will then dry his foot, apply a thick film of plain petroleum jelly/Vaseline and cover the foot with comfortable loose sock overnight.  He may use the same sock nightly.  In 3 weeks have asked him to contact me either via MyChart or by phone to let me know the foot status.  If there is no improvement and the culture is negative, we will likely add a local psoriasis medication.  If the evaluation does show fungus, we may use a short course of oral antifungal.

## 2019-12-02 ENCOUNTER — Telehealth: Payer: Self-pay | Admitting: Dermatology

## 2019-12-02 ENCOUNTER — Ambulatory Visit: Payer: Medicare Other | Admitting: Pulmonary Disease

## 2019-12-02 NOTE — Telephone Encounter (Signed)
Patient's wife is calling for culture results from last visit with [name], MD.

## 2019-12-02 NOTE — Telephone Encounter (Signed)
Left message for patient to return our phone call.

## 2019-12-02 NOTE — Telephone Encounter (Signed)
Informed patient wife brenda that fungal culture take 4 weeks : told she could look on my chart or give [place] a call back

## 2019-12-03 ENCOUNTER — Ambulatory Visit: Payer: Medicare Other | Admitting: Pulmonary Disease

## 2019-12-04 ENCOUNTER — Telehealth: Payer: Self-pay | Admitting: Pulmonary Disease

## 2019-12-04 ENCOUNTER — Encounter: Payer: Self-pay | Admitting: Pulmonary Disease

## 2019-12-04 ENCOUNTER — Ambulatory Visit: Payer: Medicare Other | Admitting: Pulmonary Disease

## 2019-12-04 ENCOUNTER — Other Ambulatory Visit: Payer: Self-pay

## 2019-12-04 VITALS — BP 112/76 | HR 65 | Temp 96.0°F | Ht 66.0 in | Wt 143.4 lb

## 2019-12-04 DIAGNOSIS — Z9109 Other allergy status, other than to drugs and biological substances: Secondary | ICD-10-CM | POA: Diagnosis not present

## 2019-12-04 DIAGNOSIS — Z0000 Encounter for general adult medical examination without abnormal findings: Secondary | ICD-10-CM | POA: Insufficient documentation

## 2019-12-04 DIAGNOSIS — Z23 Encounter for immunization: Secondary | ICD-10-CM | POA: Diagnosis not present

## 2019-12-04 DIAGNOSIS — J449 Chronic obstructive pulmonary disease, unspecified: Secondary | ICD-10-CM | POA: Diagnosis not present

## 2019-12-04 DIAGNOSIS — R918 Other nonspecific abnormal finding of lung field: Secondary | ICD-10-CM | POA: Diagnosis not present

## 2019-12-04 NOTE — Assessment & Plan Note (Signed)
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

## 2019-12-04 NOTE — Assessment & Plan Note (Signed)
Plan: Walk today High-dose flu vaccine today Continue Symbicort 80 Start daily antihistamine Can consider starting nasal saline rinses if congestion worsens

## 2019-12-04 NOTE — Progress Notes (Addendum)
@Patient  ID: [name], male    DOB: 09/05/1940, 79 y.o.   MRN: [id number]  Chief Complaint  Patient presents with  . 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  Quit 2008.  43-pack-year smoking history. Maintenance: Symbicort 80 Pt of: Dr. 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  Overall has been doing well since last being seen in March/2021 with Dr. [name].  At that office visit he stopped Singulair.  He remains adherent to Symbicort 80.  He still does have some occasional congestion but 2-3 times a week with cough.  He is actively walking his dog 2 miles a day.  He has known environmental allergies to cats, dust mites, dog dander.  He notices that in the fall to winter times he tends to have more congestion.  He is not using a daily antihistamine.  Questionaires / Pulmonary Flowsheets:   ACT:  No flowsheet data found.  MMRC: mMRC Dyspnea Scale mMRC Score  12/04/2019 1    Epworth:  No flowsheet data found.  Tests:   FENO:  No results found for: NITRICOXIDE  PFT: PFT Results Latest Ref Rng & Units 06/27/2016 03/16/2016 11/28/2015  FVC-Pre L 3.03 2.74 3.23  FVC-Predicted Pre % 87 78 92  FVC-Post L 3.47 3.27 -  FVC-Predicted Post % 99 93 -  Pre FEV1/FVC % % 53 54 45  Post FEV1/FCV % % 54 53 -  FEV1-Pre L 1.61 1.49 1.47  FEV1-Predicted Pre % 65 60 58  FEV1-Post L 1.86 1.74 -  DLCO uncorrected ml/min/mmHg - - 12.22  DLCO UNC% % - - 45  DLVA Predicted % - - 53  TLC L - - 8.31  TLC % Predicted % - - 133  RV % Predicted % - - 203    WALK:  SIX MIN WALK 03/22/2016  Medications Pt could not tell me what medications  he took//LCL  Supplimental Oxygen during Test? (L/min) No  Laps 9  Partial Lap (in Meters) 6  Baseline BP (sitting) 132/80  Baseline Heartrate 85  Baseline Dyspnea (Borg Scale) 0  Baseline Fatigue (Borg Scale) 0  Baseline SPO2 97  BP (sitting) 142/90  Heartrate 99  Dyspnea (Borg Scale) 1  Fatigue (Borg Scale) 0  SPO2 97  BP (sitting) 136/86  Heartrate 88  SPO2 98  Stopped or Paused before Six Minutes No  Distance Completed 438    Imaging: No results found.  Lab Results:  CBC    Component Value Date/Time   WBC 8.0 07/10/2008 0855   RBC 4.42 07/10/2008 0855   HGB 14.6 09/16/2015 1749   HCT 43.0 09/16/2015 1749   PLT 299 07/10/2008 0855   MCV 89.0 07/10/2008 0855   MCHC 34.2 07/10/2008 0855   RDW 12.7 07/10/2008 0855   LYMPHSABS 2.0 07/04/2008 0358   MONOABS 0.7 07/04/2008 0358   EOSABS 0.3 07/04/2008 0358   BASOSABS 0.0 07/04/2008 0358    BMET    Component Value Date/Time   NA 140 12/28/2016 1014   K 4.6 12/28/2016 1014   CL 105 12/28/2016 1014   CO2 28 12/28/2016 1014   GLUCOSE 127 (H) 12/28/2016 1014  BUN 16 12/28/2016 1014   CREATININE 0.75 12/28/2016 1014   CALCIUM 8.9 12/28/2016 1014   GFRNONAA >60 07/10/2008 0855   GFRAA  07/10/2008 0855    >60        The eGFR has been calculated using the MDRD equation. This calculation has not been validated in all clinical situations. eGFR's persistently <60 mL/min signify possible Chronic Kidney Disease.    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)  06/27/2016-pulmonary function test-FVC 3.03  (87% predicted), postbronchodilator ratio 54, postbronchodilator FEV1 of 1.86 (75% predicted), positive bronchodilator   05/20/2018-CT chest without contrast- peribronchovascular nodule likely areas of consolidation as well as bronchial wall thickening and peribronchial vascular reticular and hazy airspace opacities most evident in the right lower lobe which may reflect multifocal bronchopneumonia pattern also raises suspicion for cryptogenic organizing pneumonia, 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 Influenza, High Dose Seasonal PF 01/03/2016, 12/17/2016, 11/30/2017, 12/04/2019  . Pneumococcal Conjugate-13 07/24/2011, 03/22/2016, 03/08/2017  . Pneumococcal Polysaccharide-23 07/07/2009  . Zoster Recombinat (Shingrix) 02/16/2017    Past Medical History:  Diagnosis Date  . Abnormal finding on lung imaging 05/14/2018   04/09/2018-chest x-ray-ill-defined peripheral right midlung airspace opacity possibly early pneumonia,  >>>follow-up PA lateral chest x-ray is recommended in 3 to 4 weeks following trial of antibiotic therapy to ensure resolution and exclude underlying malignancy  05/14/2018- chest x-ray-persistent ill-defined right lung interstitial and airspace process possible persistent bronchopneumonia, chest C  . Asthma   . 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 CAD (coronary artery disease)    cath & CABG in 2008  . Cardiomyopathy, 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  . COPD (chronic  obstructive pulmonary disease) ([place])   . COPD with acute exacerbation ([place]) 04/02/2018  . Diabetes ([place])    type 2  . DM2 (diabetes mellitus, type 2) ([place]) 02/05/2013  . Dyslipidemia   . Environmental allergies 02/03/2016   02/03/2016-respiratory allergy panel, environmental allergens to dust mites, cat dander, dog dander, Timothy grass, cockroach, fungus, cedar trees, ragweed, rough pigweed, mouse urine, IgE is 689 >>>Largest elevations are to cat dander, dog dander, dust mites   . Essential hypertension 02/03/2016  . Former smoker 05/14/2018   Former smoker Quit 2008 43-pack-year smoking history  . History of nuclear stress test 10/08/2007   bruce myoview; normal scan with attenuation artifact; no ischemia/infarct; low risk   . Hypertriglyceridemia 02/03/2016  . 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 Prostate cancer ([place])   . S/P CABG (coronary artery bypass graft) 2008   post-op AF with no recurrence   . 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 [name] - LIMA to LAD, sequential saphenous vein  graft to OM1 and OM 2, SVG to posterior descending (done emergently)     Tobacco History: Social History   Tobacco Use  Smoking Status Former Smoker  . Packs/day: 1.00  . Years: 43.00  . Pack years: 43.00  . Types: Cigarettes  . Start date: 12/25/1963  . Quit date: 01/31/2007  . Years since quitting: 12.8  Smokeless Tobacco Never Used   Counseling given: Yes   Continue to not smoke  Outpatient Encounter Medications as of 12/04/2019  Medication Sig  . albuterol (VENTOLIN HFA) 108 (90 Base) MCG/ACT inhaler Inhale 2 puffs into the lungs every 6 (six) hours as needed for wheezing or shortness of breath.  . betamethasone dipropionate (DIPROLENE) 0.05 % ointment Apply topically 2 (two) times daily.  . budesonide-formoterol (SYMBICORT) 80-4.5 MCG/ACT inhaler Inhale 2 puffs into the lungs in the morning and at bedtime.  . Cyanocobalamin (VITAMIN B-12 SL) Place 1 tablet under the tongue daily.  [name]  diltiazem (CARDIZEM CD) 180 MG 24 hr capsule Take 1 capsule (180 mg total) by mouth daily.  . folic acid (FOLVITE) 1 MG tablet Take 1 mg by mouth daily.  [name] gabapentin (NEURONTIN) 300 MG capsule Take 1 capsule (300 mg total) by mouth 3 (three) times daily as needed (nerve pain).  [name] losartan (COZAAR) 25 MG tablet Take 25 mg by mouth daily. as directed  . metFORMIN (GLUCOPHAGE) 500 MG tablet Take 500 mg by mouth 2 (two) times daily with a meal.   . nitroGLYCERIN (NITROSTAT) 0.4 MG SL tablet Place 0.4 mg under the tongue every 5 (five) minutes x 3 doses as needed.  . Omega-3 Fatty Acids (FISH OIL PO) Take 1 capsule by mouth at bedtime.  . pravastatin (PRAVACHOL) 40 MG tablet Take 1 tablet (40 mg total) by mouth daily.  . [DISCONTINUED] predniSONE (DELTASONE) 10 MG tablet Take 3 tablets (30 mg total) by mouth daily with breakfast.   No facility-administered encounter medications on file as of 12/04/2019.     Review of Systems  Review of Systems  Constitutional: Negative for activity change, chills, fatigue, fever and unexpected weight change.  HENT: Positive for congestion. Negative for postnasal drip, rhinorrhea, sinus pressure, sinus pain and sore throat.   Eyes: Negative.   Respiratory: Positive for shortness of breath. Negative for cough and wheezing.   Cardiovascular: Negative for chest pain and palpitations.  Gastrointestinal: Negative for constipation, diarrhea, nausea and vomiting.  Endocrine: Negative.   Genitourinary: Negative.   Musculoskeletal: Negative.   Skin: Negative.   Neurological: Negative for dizziness and headaches.  Psychiatric/Behavioral: Negative.  Negative for dysphoric mood. The patient is not nervous/anxious.   All other systems reviewed and are negative.    Physical Exam  BP 112/76 (Patient Position: Sitting, Cuff Size: Normal)   Pulse 65   Temp (!) 96 F (35.6 C) (Oral)   Ht 5' 6"  (1.676 m)   Wt 143 lb 6.4 oz (65 kg)   SpO2 97%   BMI 23.15 kg/m   Wt  Readings from Last 5 Encounters:  12/04/19 143 lb 6.4 oz (65 kg)  09/04/19 144 lb 12.8 oz (65.7 kg)  09/04/19 145 lb 9.6 oz (66 kg)  05/26/19 145 lb 3.2 oz (65.9 kg)  03/05/19 148 lb (67.1 kg)    BMI Readings from Last 5 Encounters:  12/04/19 23.15 kg/m  09/04/19 23.37 kg/m  09/04/19 23.50 kg/m  05/26/19 23.44 kg/m  03/05/19 23.89 kg/m     Physical Exam Vitals and nursing note reviewed.  Constitutional:  General: He is not in acute distress.    Appearance: Normal appearance. He is normal weight.  HENT:     Head: Normocephalic and atraumatic.     Right Ear: Hearing and external ear normal.     Left Ear: Hearing and external ear normal.     Nose: Nose normal. No mucosal edema or rhinorrhea.     Right Turbinates: Not enlarged.     Left Turbinates: Not enlarged.     Mouth/Throat:     Mouth: Mucous membranes are dry.     Pharynx: Oropharynx is clear. No oropharyngeal exudate.     Comments: pnd Eyes:     Pupils: Pupils are equal, round, and reactive to light.  Cardiovascular:     Rate and Rhythm: Normal rate and regular rhythm.     Pulses: Normal pulses.     Heart sounds: Normal heart sounds. No murmur heard.   Pulmonary:     Effort: Pulmonary effort is normal.     Breath sounds: Normal breath sounds. No decreased breath sounds, wheezing or rales.  Abdominal:     General: Bowel sounds are normal. There is no distension.     Palpations: Abdomen is soft.     Tenderness: There is no abdominal tenderness.  Musculoskeletal:     Cervical back: Normal range of motion.     Right lower leg: No edema.     Left lower leg: No edema.  Lymphadenopathy:     Cervical: No cervical adenopathy.  Skin:    General: Skin is warm and dry.     Capillary Refill: Capillary refill takes less than 2 seconds.     Findings: No erythema or rash.  Neurological:     General: No focal deficit present.     Mental Status: He is alert and oriented to person, place, and time.     Motor: No  weakness.     Coordination: Coordination normal.     Gait: Gait is intact. Gait normal.  Psychiatric:        Mood and Affect: Mood normal.        Behavior: Behavior normal. Behavior is cooperative.        Thought Content: Thought content normal.        Judgment: Judgment normal.       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 [name].   Lauraine Rinne, NP 12/04/2019   This appointment required 36 minutes of patient care (this includes precharting, chart review, review of results, face-to-face care, etc.).

## 2019-12-04 NOTE — Patient Instructions (Addendum)
You were seen today by Lauraine Rinne, NP  for:   1. Asthma-COPD overlap syndrome (HCC)  Walk today in office  Continue Symbicort 80 >>> 2 puffs in the morning right when you wake up, rinse out your mouth after use, 12 hours later 2 puffs, rinse after use >>> Take this daily, no matter what >>> This is not a rescue inhaler  >>>Use spacer   Only use your albuterol as a rescue medication to be used if you can't catch your breath by resting or doing a relaxed purse lip breathing pattern.  - The less you use it, the better it will work when you need it. - Ok to use up to 2 puffs  every 4 hours if you must but call for immediate appointment if use goes up over your usual need - Don't leave home without it !!  (think of it like the spare tire for your car)   2. Environmental allergies  Please start taking a daily antihistamine:  >>>choose one of: zyrtec, claritin, allegra, or xyzal  >>>these are over the counter medications  >>>can choose generic option  >>>take daily  >>>this medication helps with allergies, post nasal drip, and cough   Can consider nasal saline rinses 1-2x daily Use distilled water Shake well Get bottle lukewarm like a baby bottle   3.  Healthcare maintenance  High-dose flu vaccine today   We recommend today:  Orders Placed This Encounter  Procedures  . Flu vaccine HIGH DOSE PF (Fluzone High dose)   Orders Placed This Encounter  Procedures  . Flu vaccine HIGH DOSE PF (Fluzone High dose)   No orders of the defined types were placed in this encounter.   Follow Up:    No follow-ups on file.   Notification of test results are managed in the following manner: If there are  any recommendations or changes to the  plan of care discussed in office today,  we will contact you and let you know what they are. If you do not hear from [place], then your results are normal and you can view them through your  MyChart account , or a letter will be sent to you. Thank you  again for trusting [place] with your care  - Thank you, [facility]    It is flu season:   >>> Best ways to protect herself from the flu: Receive the yearly flu vaccine, practice good hand hygiene washing with soap and also using hand sanitizer when available, eat a nutritious meals, get adequate rest, hydrate appropriately       Please contact the office if your symptoms worsen or you have concerns that you are not improving.   Thank you for choosing [facility] for your healthcare, and for allowing [place] to partner with you on your healthcare journey. I am thankful to be able to provide care to you today.   Wyn Quaker FNP-C      Influenza Virus Vaccine injection What is this medicine? INFLUENZA VIRUS VACCINE (in floo EN zuh VAHY ruhs vak SEEN) helps to reduce the risk of getting influenza also known as the flu. The vaccine only helps protect you against some strains of the flu. This medicine may be used for other purposes; ask your health care provider or pharmacist if you have questions. COMMON BRAND NAME(S): Afluria, Afluria Quadrivalent, Agriflu, Alfuria, FLUAD, Fluarix, Fluarix Quadrivalent, Flublok, Flublok Quadrivalent, FLUCELVAX, FLUCELVAX Quadrivalent, Flulaval, Flulaval Quadrivalent, Fluvirin, Fluzone, Fluzone High-Dose, Fluzone Intradermal, Fluzone Quadrivalent What  should I tell my health care provider before I take this medicine? They need to know if you have any of these conditions:  bleeding disorder like hemophilia  fever or infection  Guillain-Barre syndrome or other neurological problems  immune system problems  infection with the human immunodeficiency virus (HIV) or AIDS  low blood platelet counts  multiple sclerosis  an unusual or allergic reaction to influenza virus vaccine, latex, other medicines, foods, dyes, or preservatives. Different brands of vaccines contain different allergens. Some may contain latex or eggs. Talk to your doctor  about your allergies to make sure that you get the right vaccine.  pregnant or trying to get pregnant  breast-feeding How should I use this medicine? This vaccine is for injection into a muscle or under the skin. It is given by a health care professional. A copy of Vaccine Information Statements will be given before each vaccination. Read this sheet carefully each time. The sheet may change frequently. Talk to your healthcare provider to see which vaccines are right for you. Some vaccines should not be used in all age groups. Overdosage: If you think you have taken too much of this medicine contact a poison control center or emergency room at once. NOTE: This medicine is only for you. Do not share this medicine with others. What if I miss a dose? This does not apply. What may interact with this medicine?  chemotherapy or radiation therapy  medicines that lower your immune system like etanercept, anakinra, infliximab, and adalimumab  medicines that treat or prevent blood clots like warfarin  phenytoin  steroid medicines like prednisone or cortisone  theophylline  vaccines This list may not describe all possible interactions. Give your health care provider a list of all the medicines, herbs, non-prescription drugs, or dietary supplements you use. Also tell them if you smoke, drink alcohol, or use illegal drugs. Some items may interact with your medicine. What should I watch for while using this medicine? Report any side effects that do not go away within 3 days to your doctor or health care professional. Call your health care provider if any unusual symptoms occur within 6 weeks of receiving this vaccine. You may still catch the flu, but the illness is not usually as bad. You cannot get the flu from the vaccine. The vaccine will not protect against colds or other illnesses that may cause fever. The vaccine is needed every year. What side effects may I notice from receiving this  medicine? Side effects that you should report to your doctor or health care professional as soon as possible:  allergic reactions like skin rash, itching or hives, swelling of the face, lips, or tongue Side effects that usually do not require medical attention (report to your doctor or health care professional if they continue or are bothersome):  fever  headache  muscle aches and pains  pain, tenderness, redness, or swelling at the injection site  tiredness This list may not describe all possible side effects. Call your doctor for medical advice about side effects. You may report side effects to FDA at 1-800-FDA-1088. Where should I keep my medicine? The vaccine will be given by a health care professional in a clinic, pharmacy, doctor's office, or other health care setting. You will not be given vaccine doses to store at home. NOTE: This sheet is a summary. It may not cover all possible information. If you have questions about this medicine, talk to your doctor, pharmacist, or health care provider.  2020 Elsevier/Gold  Standard (2018-01-28 08:45:43)

## 2019-12-04 NOTE — Telephone Encounter (Signed)
Called and spoke with pt and wife [name] who stated that the height that was documented during pt's OV was not correct. Height that was documented was 4'11" when pt is actually 5'6.  I stated to [name] that we would get this fixed.  Sent a message to [name] in regards to this and he went back and reviewed the height that was documented. [name] did an addendum on the OV and changed pt's height to 5'6. Called pt's wife back and stated to her that we fixed pt's height and she verbalized understanding. Nothing further needed.

## 2019-12-04 NOTE — Assessment & Plan Note (Signed)
Plan: High-dose flu vaccine today 

## 2019-12-04 NOTE — Assessment & Plan Note (Signed)
Plan: Start daily antihistamine Encourage patient to consider starting nasal saline rinses when congestion worsens

## 2019-12-07 ENCOUNTER — Telehealth: Payer: Self-pay

## 2019-12-07 NOTE — Telephone Encounter (Signed)
lmtcb

## 2019-12-09 MED ORDER — DILTIAZEM HCL ER COATED BEADS 180 MG PO CP24: 180.0000 mg | ORAL_CAPSULE | Freq: Every day | ORAL | 1 refills | Status: DC

## 2019-12-09 NOTE — Telephone Encounter (Signed)
Patient returned your call.

## 2019-12-09 NOTE — Telephone Encounter (Signed)
Pt advised that he is taking  Diltiazem for HR control, not BP. Pt agreeable to restarting. Rx sent to pharmacy.

## 2019-12-10 ENCOUNTER — Ambulatory Visit: Payer: Medicare Other | Admitting: Podiatry

## 2019-12-25 ENCOUNTER — Encounter: Payer: Self-pay | Admitting: Dermatology

## 2019-12-25 NOTE — Progress Notes (Addendum)
   Follow-Up Visit   Subjective  [name] is a 79 y.o. male who presents for the following: Psoriasis (right foot--rough, dry skin, and causing problem on the right heel--noticed for 2 months).  Rash Location: Right foot Duration:  Quality:  Associated Signs/Symptoms: Modifying Factors:  Severity:  Timing: Context:   Objective  Well appearing patient in no apparent distress; mood and affect are within normal limits.  All skin waist up examined. Plus legs and nails.   Assessment & Plan      Follow-up visit for [name] date of birth 1940-07-20 ([birth year] 78th).  He has been plagued with peeling and cracking and pain on his right heel for months now. he has had several visits to podiatrist who have variably recommended a cream with urea, told him is not a fungus, and is also tried some O'Keefe's foot cream all with variable transient benefit.  Examination showed hyperkeratotic and cracked plaques limited to the right heel.  There is no sign of fungus on either forefoot.  The hands and nails are unaffected.  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  I did obtain a fungal scraping + culture and we will call [name] if the scraping shows fungus later today.  If he does not receive a phone call from me, then nightly he will first soak his right foot in a dish pan of comfortably warm water (he may put a couple of capfuls of any liquid moisturizer like CeraVe or Cetaphil into the water if the water is too drying).  This will be done just before going to sleep.  He will then dry his foot, apply a thick film of plain petroleum jelly/Vaseline and cover the foot with comfortable loose sock overnight.  He may use the same sock nightly.  In 3 weeks have asked him to contact me either via MyChart or by phone to let me know the foot status.  If there is no improvement and the culture is negative, we will likely add a local psoriasis medication.  If the  evaluation does show fungus, we may use a short course of oral antifungal.  I, [name], MD, have reviewed all documentation for this visit.  The documentation on 12/25/19 for the exam, diagnosis, procedures, and orders are all accurate and complete.

## 2019-12-30 LAB — CULTURE, FUNGUS WITHOUT SMEAR
MICRO NUMBER:: 10941957
SPECIMEN QUALITY:: ADEQUATE

## 2019-12-31 ENCOUNTER — Telehealth: Payer: Self-pay | Admitting: Podiatry

## 2019-12-31 NOTE — Telephone Encounter (Signed)
Patient wife called inquiring about results from biopsy and wanting return phone call, stated they have called in before and still no response

## 2020-01-01 DIAGNOSIS — S91301A Unspecified open wound, right foot, initial encounter: Secondary | ICD-10-CM | POA: Diagnosis not present

## 2020-01-01 DIAGNOSIS — E119 Type 2 diabetes mellitus without complications: Secondary | ICD-10-CM | POA: Diagnosis not present

## 2020-01-04 ENCOUNTER — Encounter: Payer: Self-pay | Admitting: *Deleted

## 2020-01-05 ENCOUNTER — Other Ambulatory Visit: Payer: Self-pay

## 2020-01-05 ENCOUNTER — Encounter: Payer: Self-pay | Admitting: Dermatology

## 2020-01-05 ENCOUNTER — Ambulatory Visit: Payer: Medicare Other | Admitting: Dermatology

## 2020-01-05 DIAGNOSIS — B353 Tinea pedis: Secondary | ICD-10-CM | POA: Diagnosis not present

## 2020-01-05 MED ORDER — HALOBETASOL PROPIONATE 0.05 % EX OINT: TOPICAL_OINTMENT | Freq: Two times a day (BID) | CUTANEOUS | 2 refills | Status: DC

## 2020-01-12 ENCOUNTER — Ambulatory Visit: Payer: Medicare Other | Attending: Internal Medicine

## 2020-01-12 DIAGNOSIS — Z23 Encounter for immunization: Secondary | ICD-10-CM

## 2020-01-12 NOTE — Progress Notes (Signed)
   Covid-19 Vaccination Clinic  Name:  TASHA JINDRA    MRN: [id number] DOB: July 10, 1940  01/12/2020  Mr. Caratachea was observed post Covid-19 immunization for 15 minutes without incident. He was provided with Vaccine Information Sheet and instruction to access the V-Safe system.   Mr. Ingham was instructed to call 911 with any severe reactions post vaccine: [name] Difficulty breathing  . Swelling of face and throat  . A fast heartbeat  . A bad rash all over body  . Dizziness and weakness

## 2020-01-18 DIAGNOSIS — S91301A Unspecified open wound, right foot, initial encounter: Secondary | ICD-10-CM | POA: Diagnosis not present

## 2020-01-18 DIAGNOSIS — E119 Type 2 diabetes mellitus without complications: Secondary | ICD-10-CM | POA: Diagnosis not present

## 2020-01-21 DIAGNOSIS — E1351 Other specified diabetes mellitus with diabetic peripheral angiopathy without gangrene: Secondary | ICD-10-CM | POA: Diagnosis not present

## 2020-01-21 DIAGNOSIS — L84 Corns and callosities: Secondary | ICD-10-CM | POA: Diagnosis not present

## 2020-01-21 DIAGNOSIS — L602 Onychogryphosis: Secondary | ICD-10-CM | POA: Diagnosis not present

## 2020-02-03 DIAGNOSIS — H43813 Vitreous degeneration, bilateral: Secondary | ICD-10-CM | POA: Diagnosis not present

## 2020-02-03 DIAGNOSIS — H0102A Squamous blepharitis right eye, upper and lower eyelids: Secondary | ICD-10-CM | POA: Diagnosis not present

## 2020-02-03 DIAGNOSIS — H0102B Squamous blepharitis left eye, upper and lower eyelids: Secondary | ICD-10-CM | POA: Diagnosis not present

## 2020-02-03 DIAGNOSIS — H26491 Other secondary cataract, right eye: Secondary | ICD-10-CM | POA: Diagnosis not present

## 2020-02-03 DIAGNOSIS — Z961 Presence of intraocular lens: Secondary | ICD-10-CM | POA: Diagnosis not present

## 2020-02-03 DIAGNOSIS — E119 Type 2 diabetes mellitus without complications: Secondary | ICD-10-CM | POA: Diagnosis not present

## 2020-02-13 ENCOUNTER — Encounter: Payer: Self-pay | Admitting: Dermatology

## 2020-02-13 NOTE — Progress Notes (Signed)
   Follow-Up Visit   Subjective  [name] is a 79 y.o. male who presents for the following: Follow-up (RIGHT FOOT/HEEL SOME BETTER TX  SOAK WARM WATER & VASELINE).  Right foot scale with history of swelling Location:  Duration:  Quality: Moderately improved Associated Signs/Symptoms: Modifying Factors:  soaks followed by overnight petrolatum Severity:  Timing: Context:   Objective  Well appearing patient in no apparent distress; mood and affect are within normal limits.  A focused examination was performed including Hands and feet and nails. Relevant physical exam findings are noted in the Assessment and Plan.   Assessment & Plan    Tinea pedis of right foot Right Medial Heel  6 week follow - If cream fails- next step fasting labs & soriatane   halobetasol (ULTRAVATE) 0.05 % ointment - Right Medial Heel      I, [name], MD, have reviewed all documentation for this visit.  The documentation on 02/13/20 for the exam, diagnosis, procedures, and orders are all accurate and complete.

## 2020-02-14 ENCOUNTER — Other Ambulatory Visit: Payer: Self-pay | Admitting: Pulmonary Disease

## 2020-02-14 DIAGNOSIS — J449 Chronic obstructive pulmonary disease, unspecified: Secondary | ICD-10-CM

## 2020-02-23 ENCOUNTER — Ambulatory Visit: Payer: Medicare Other | Admitting: Dermatology

## 2020-02-23 ENCOUNTER — Encounter: Payer: Self-pay | Admitting: Dermatology

## 2020-02-23 ENCOUNTER — Other Ambulatory Visit: Payer: Self-pay

## 2020-02-23 DIAGNOSIS — L409 Psoriasis, unspecified: Secondary | ICD-10-CM

## 2020-03-04 DIAGNOSIS — E119 Type 2 diabetes mellitus without complications: Secondary | ICD-10-CM | POA: Diagnosis not present

## 2020-03-06 ENCOUNTER — Encounter: Payer: Self-pay | Admitting: Dermatology

## 2020-03-06 NOTE — Progress Notes (Signed)
   Follow-Up Visit   Subjective  [name] is a 79 y.o. male who presents for the following: Follow-up (right foot- tinea pedis- tx- halobetasol ointment).  Rash Location: Mostly right heel area Duration:  Quality: Improved Associated Signs/Symptoms: Modifying Factors: Halobetasol Severity:  Timing: Context:   Objective  Well appearing patient in no apparent distress; mood and affect are within normal limits. Objective  Right Plantar Surface of Heel: Perhaps 70% improvement in 10 hyperkeratotic fissured psoriasiform inflammation.   A focused examination was performed including Hands, feet, nails.. Relevant physical exam findings are noted in the Assessment and Plan.   Assessment & Plan    Psoriasis Right Plantar Surface of Heel  Continue halobetasol, taper use of moist wraps.  Hold on initiating Soriatane.  If doing well, taper halobetasol and encourage use of moisturizers for 10% urea cream.  Follow-up by MyChart or phone in 4 to 6 weeks.     I, [name], MD, have reviewed all documentation for this visit.  The documentation on 03/06/20 for the exam, diagnosis, procedures, and orders are all accurate and complete.

## 2020-03-17 ENCOUNTER — Ambulatory Visit: Payer: Medicare Other | Admitting: Cardiology

## 2020-03-22 ENCOUNTER — Encounter: Payer: Self-pay | Admitting: Student

## 2020-03-22 ENCOUNTER — Other Ambulatory Visit: Payer: Self-pay

## 2020-03-22 ENCOUNTER — Ambulatory Visit: Payer: Medicare Other | Admitting: Student

## 2020-03-22 VITALS — BP 102/68 | HR 74 | Ht 66.0 in | Wt 148.0 lb

## 2020-03-22 DIAGNOSIS — I10 Essential (primary) hypertension: Secondary | ICD-10-CM | POA: Diagnosis not present

## 2020-03-22 DIAGNOSIS — I480 Paroxysmal atrial fibrillation: Secondary | ICD-10-CM | POA: Diagnosis not present

## 2020-03-22 DIAGNOSIS — Z951 Presence of aortocoronary bypass graft: Secondary | ICD-10-CM

## 2020-03-22 NOTE — Progress Notes (Signed)
PCP:  [name], MD Primary Cardiologist: No primary care provider on file. Electrophysiologist: Will Meredith Leeds, MD   [name] is a 80 y.o. male seen today for Will Meredith Leeds, MD for routine electrophysiology followup.  Since last being seen in our clinic the patient reports doing very well. he denies chest pain, palpitations, dyspnea, PND, orthopnea, nausea, vomiting, dizziness, syncope, edema, weight gain, or early satiety.  Past Medical History:  Diagnosis Date  . Abnormal finding on lung imaging 05/14/2018   04/09/2018-chest x-ray-ill-defined peripheral right midlung airspace opacity possibly early pneumonia,  >>>follow-up PA lateral chest x-ray is recommended in 3 to 4 weeks following trial of antibiotic therapy to ensure resolution and exclude underlying malignancy  05/14/2018- chest x-ray-persistent ill-defined right lung interstitial and airspace process possible persistent bronchopneumonia, chest C  . Asthma   . 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 Basal cell carcinoma 08/20/2016   left cheek-cx3 excision  . BCC (basal cell carcinoma) 09/20/2016   left cheek-mohs  . CAD (coronary artery disease)    cath & CABG in 2008  . Cardiomyopathy, 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  . COPD (chronic obstructive pulmonary disease) ([place])   . COPD with acute exacerbation ([place]) 04/02/2018  . Diabetes ([place])    type 2  . DM2 (diabetes mellitus, type 2) ([place]) 02/05/2013  . Dyslipidemia   . Environmental allergies 02/03/2016   02/03/2016-respiratory allergy panel, environmental allergens  to dust mites, cat dander, dog dander, Timothy grass, cockroach, fungus, cedar trees, ragweed, rough pigweed, mouse urine, IgE is 689 >>>Largest elevations are to cat dander, dog dander, dust mites   . Essential hypertension 02/03/2016  . Former smoker 05/14/2018   Former smoker Quit 2008 43-pack-year smoking history  . History of nuclear stress test 10/08/2007   bruce myoview; normal scan with attenuation artifact; no ischemia/infarct; low risk   . Hypertriglyceridemia 02/03/2016  . 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 Prostate cancer ([place])   . S/P CABG (coronary artery bypass graft) 2008   post-op AF with no recurrence   . 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 [name] - LIMA to LAD, sequential saphenous vein graft to OM1 and OM 2, SVG to posterior descending (done emergently)    Past Surgical History:  Procedure Laterality Date  . CARDIAC CATHETERIZATION  2003   total occlusion of LAD with left to left collaterals, mod disease of RCA & mild disease in Cfx (Dr. Rockne Menghini)  . Carotid Doppler  06/2010   stenosis of ICA less than 50%   . CORONARY ARTERY BYPASS GRAFT  05/31/2006   LIMA to LAD, SVG to OM1 & OM2, SVG to PDA (Dr. Prescott Gum)  . NASAL SEPTOPLASTY W/ TURBINOPLASTY  1970s  . PROSTATE BIOPSY    . TONSILLECTOMY      Current Outpatient Medications  Medication Sig Dispense Refill  . albuterol (VENTOLIN HFA) 108 (90 Base) MCG/ACT inhaler Inhale 2 puffs into the lungs every 6 (six) hours as needed for wheezing or shortness of breath. 8 g 2  . Cyanocobalamin (VITAMIN B-12 SL) Place 1 tablet under the tongue daily.    [name] diltiazem (CARDIZEM CD) 180 MG 24 hr capsule  Take 1 capsule (180 mg total) by mouth daily. 90 capsule 1  . folic acid (FOLVITE) 1 MG tablet Take 1 mg by mouth daily.    . halobetasol (ULTRAVATE) 0.05 % ointment Apply topically 2 (two) times daily. (Patient taking differently: Apply topically as needed.) 50 g 2  . losartan (COZAAR) 25 MG tablet Take 25 mg by mouth daily. as  directed  1  . metFORMIN (GLUCOPHAGE) 500 MG tablet Take 500 mg by mouth 2 (two) times daily with a meal.     . nitroGLYCERIN (NITROSTAT) 0.4 MG SL tablet Place 0.4 mg under the tongue every 5 (five) minutes x 3 doses as needed.    . Omega-3 Fatty Acids (FISH OIL PO) Take 1 capsule by mouth at bedtime.    . pravastatin (PRAVACHOL) 40 MG tablet Take 1 tablet (40 mg total) by mouth daily. 90 tablet 3  . SYMBICORT 80-4.5 MCG/ACT inhaler INHALE 2 PUFFS INTO THE LUNGS IN THE MORNING AND AT BEDTIME 10.2 g 3   No current facility-administered medications for this visit.    Allergies  Allergen Reactions  . Lisinopril Other (See Comments)    Social History   Socioeconomic History  . Marital status: Married    Spouse name: Not on file  . Number of children: Not on file  . Years of education: Not on file  . Highest education level: Not on file  Occupational History  . Not on file  Tobacco Use  . Smoking status: Former Smoker    Packs/day: 1.00    Years: 43.00    Pack years: 43.00    Types: Cigarettes    Start date: 12/25/1963    Quit date: 01/31/2007    Years since quitting: 13.1  . Smokeless tobacco: Never Used  Vaping Use  . Vaping Use: Never used  Substance and Sexual Activity  . Alcohol use: Yes    Comment: few drinks a month  . Drug use: No  . Sexual activity: Not on file  Other Topics Concern  . Not on file  Social History Narrative   Originally from [place], [place]. Moved to [place] in 1968. 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 He grew up in [place], [place]. Currently has a dog. No bird or mold exposure.    Social Determinants of Health   Financial Resource Strain: Not on file  Food Insecurity: Not on file  Transportation Needs: Not on file  Physical Activity: Not on file  Stress: Not on file  Social Connections: Not on file  Intimate Partner Violence: Not on file    Review of Systems: All other systems reviewed and are  otherwise negative except as noted above.  Physical Exam: Vitals:   03/22/20 0954  BP: 102/68  Pulse: 74  SpO2: 99%  Weight: 148 lb (67.1 kg)  Height: 5\' 6"  (1.676 m)    GEN- The patient is well appearing, alert and oriented x 3 today.   HEENT: normocephalic, atraumatic; sclera clear, conjunctiva pink; hearing intact; oropharynx clear; neck supple, no JVP Lymph- no cervical lymphadenopathy Lungs- Clear to ausculation bilaterally, normal work of breathing.  No wheezes, rales, rhonchi Heart- Regular rate and rhythm, no murmurs, rubs or gallops, PMI not laterally displaced GI- soft, non-tender, non-distended, bowel sounds present, no hepatosplenomegaly Extremities- no clubbing, cyanosis, or edema; DP/PT/radial pulses 2+ bilaterally MS- no significant deformity or atrophy Skin- warm and dry, no rash or lesion Psych- euthymic mood,  full affect Neuro- strength and sensation are intact  EKG is not ordered.   Additional studies reviewed include: EP office notes  Assessment and Plan:  1.  Paroxysmal atrial fibrillation Maintaining NSR by symptoms.  CHADS2VASC is 4 - he and Dr Elberta Fortis decided through a shared decision making process to stop OAC with significant bruising.  Continue ASA 81mg  daily.  He would be a reasonable candidate for Watchman or monitoring with LOOP to ensure overall low AF burden given no h/o stroke. We discussed both of these at length today. He prefers conservative watchful waiting at this time.   2.  CAD s/p CABG Denies s/s of ischemia Continue medical therapy  , PA-C  03/22/20 10:02 AM

## 2020-03-22 NOTE — Patient Instructions (Signed)
Medication Instructions:  *If you need a refill on your cardiac medications before your next appointment, please call your pharmacy*  Follow-Up: At [facility], you and your health needs are our priority.  As part of our continuing mission to provide you with exceptional heart care, we have created designated Provider Care Teams.  These Care Teams include your primary Cardiologist (physician) and Advanced Practice Providers (APPs -  Physician Assistants and Nurse Practitioners) who all work together to provide you with the care you need, when you need it.  We recommend signing up for the patient portal called "MyChart".  Sign up information is provided on this After Visit Summary.  MyChart is used to connect with patients for Virtual Visits (Telemedicine).  Patients are able to view lab/test results, encounter notes, upcoming appointments, etc.  Non-urgent messages can be sent to your provider as well.   To learn more about what you can do with MyChart, go to [email or web address].    Your next appointment:   Your physician recommends that you schedule a follow-up appointment in: 6 MONTHS with Dr. Camnitz  The format for your next appointment:   In Person with Will Camnitz, MD     

## 2020-03-31 DIAGNOSIS — M792 Neuralgia and neuritis, unspecified: Secondary | ICD-10-CM | POA: Diagnosis not present

## 2020-03-31 DIAGNOSIS — L84 Corns and callosities: Secondary | ICD-10-CM | POA: Diagnosis not present

## 2020-03-31 DIAGNOSIS — M6281 Muscle weakness (generalized): Secondary | ICD-10-CM | POA: Diagnosis not present

## 2020-03-31 DIAGNOSIS — I739 Peripheral vascular disease, unspecified: Secondary | ICD-10-CM | POA: Diagnosis not present

## 2020-03-31 DIAGNOSIS — E1351 Other specified diabetes mellitus with diabetic peripheral angiopathy without gangrene: Secondary | ICD-10-CM | POA: Diagnosis not present

## 2020-03-31 DIAGNOSIS — L602 Onychogryphosis: Secondary | ICD-10-CM | POA: Diagnosis not present

## 2020-06-06 ENCOUNTER — Other Ambulatory Visit: Payer: Self-pay | Admitting: Cardiology

## 2020-06-06 DIAGNOSIS — L84 Corns and callosities: Secondary | ICD-10-CM | POA: Diagnosis not present

## 2020-06-06 DIAGNOSIS — L602 Onychogryphosis: Secondary | ICD-10-CM | POA: Diagnosis not present

## 2020-06-06 DIAGNOSIS — I739 Peripheral vascular disease, unspecified: Secondary | ICD-10-CM | POA: Diagnosis not present

## 2020-06-10 DIAGNOSIS — S90811A Abrasion, right foot, initial encounter: Secondary | ICD-10-CM | POA: Diagnosis not present

## 2020-06-14 ENCOUNTER — Ambulatory Visit: Payer: Medicare Other | Admitting: Cardiology

## 2020-06-21 DIAGNOSIS — S90811A Abrasion, right foot, initial encounter: Secondary | ICD-10-CM | POA: Diagnosis not present

## 2020-07-12 ENCOUNTER — Other Ambulatory Visit: Payer: Self-pay

## 2020-07-12 ENCOUNTER — Ambulatory Visit: Payer: Medicare Other | Admitting: Dermatology

## 2020-07-12 ENCOUNTER — Encounter: Payer: Self-pay | Admitting: Dermatology

## 2020-07-12 DIAGNOSIS — D692 Other nonthrombocytopenic purpura: Secondary | ICD-10-CM | POA: Diagnosis not present

## 2020-07-12 DIAGNOSIS — Z85828 Personal history of other malignant neoplasm of skin: Secondary | ICD-10-CM | POA: Diagnosis not present

## 2020-07-12 DIAGNOSIS — H0015 Chalazion left lower eyelid: Secondary | ICD-10-CM | POA: Diagnosis not present

## 2020-07-12 DIAGNOSIS — D485 Neoplasm of uncertain behavior of skin: Secondary | ICD-10-CM

## 2020-07-12 DIAGNOSIS — C44311 Basal cell carcinoma of skin of nose: Secondary | ICD-10-CM | POA: Diagnosis not present

## 2020-07-12 DIAGNOSIS — L570 Actinic keratosis: Secondary | ICD-10-CM

## 2020-07-12 MED ORDER — DERMEND BRUISE FORMULA EX CREA: 1.0000 "application " | TOPICAL_CREAM | Freq: Every evening | CUTANEOUS | 0 refills | Status: DC

## 2020-07-12 NOTE — Patient Instructions (Signed)

## 2020-07-19 ENCOUNTER — Telehealth: Payer: Self-pay | Admitting: *Deleted

## 2020-07-19 NOTE — Telephone Encounter (Signed)
-----   Message from [name], MD sent at 07/19/2020  6:33 AM EDT ----- Schedule Mohs

## 2020-07-19 NOTE — Telephone Encounter (Signed)
Path to patient. Mohs referral info sent to skin surgery center.

## 2020-07-22 ENCOUNTER — Encounter: Payer: Self-pay | Admitting: Dermatology

## 2020-07-22 NOTE — Progress Notes (Signed)
   Follow-Up Visit   Subjective  [name] is a 80 y.o. male who presents for the following: Skin Problem (Nose lesion nonhealing x months. Per patient chart he does have history of bcc x2.).  Nonhealing growth on nose plus check a few other spots Location:  Duration:  Quality:  Associated Signs/Symptoms: Modifying Factors:  Severity:  Timing: Context:   Objective  Well appearing patient in no apparent distress; mood and affect are within normal limits. Objective  Left Nasal Sidewall: Pearly 1.1 cm nodule with central ulceration       Objective  Left Superior Helix, Right Forehead, Right Superior Helix: Half dozen pink 2 to 4 mm gritty crusts  Objective  Left Temple: History of multiple BCCs, no sign recurrence    A focused examination was performed including Head, neck, arms. Relevant physical exam findings are noted in the Assessment and Plan.   Assessment & Plan    Neoplasm of uncertain behavior of skin Left Nasal Sidewall  Skin / nail biopsy Type of biopsy: tangential   Informed consent: discussed and consent obtained   Timeout: patient name, date of birth, surgical site, and procedure verified   Procedure prep:  Patient was prepped and draped in usual sterile fashion (Non sterile) Prep type:  Chlorhexidine Anesthesia: the lesion was anesthetized in a standard fashion   Anesthetic:  1% lidocaine w/ epinephrine 1-100,000 local infiltration Instrument used: flexible razor blade   Hemostasis achieved with: ferric subsulfate   Outcome: patient tolerated procedure well   Post-procedure details: wound care instructions given    Specimen 1 - Surgical pathology Differential Diagnosis: R/O BCC vs SCC  Check Margins: No  Shave biopsy, will refer for Mohs if confirmatory.  AK (actinic keratosis) (3) Left Superior Helix; Right Superior Helix; Right Forehead  Destruction of lesion - Left Superior Helix, Right Forehead, Right Superior Helix Complexity:  simple   Destruction method: cryotherapy   Informed consent: discussed and consent obtained   Timeout:  patient name, date of birth, surgical site, and procedure verified Lesion destroyed using liquid nitrogen: Yes   Cryotherapy cycles:  3 Outcome: patient tolerated procedure well with no complications   Post-procedure details: wound care instructions given    Solar purpura ([place]) (2) Left Forearm - Anterior; Right Forearm - Anterior  Emollient (DERMEND BRUISE FORMULA) CREA - Left Forearm - Anterior, Right Forearm - Anterior  Chalazion left lower eyelid Left Lower Eyelid  Personal history of skin cancer Left Temple  Recheck as needed change      I, Jonathan Monarch, MD, have reviewed all documentation for this visit.  The documentation on 07/22/20 for the exam, diagnosis, procedures, and orders are all accurate and complete.

## 2020-08-02 ENCOUNTER — Ambulatory Visit: Payer: Medicare Other | Attending: Critical Care Medicine

## 2020-08-02 ENCOUNTER — Other Ambulatory Visit: Payer: Medicare Other

## 2020-08-02 DIAGNOSIS — Z20822 Contact with and (suspected) exposure to covid-19: Secondary | ICD-10-CM

## 2020-08-03 ENCOUNTER — Telehealth: Payer: Self-pay

## 2020-08-03 LAB — SARS-COV-2, NAA 2 DAY TAT

## 2020-08-03 LAB — NOVEL CORONAVIRUS, NAA: SARS-CoV-2, NAA: NOT DETECTED

## 2020-08-03 NOTE — Telephone Encounter (Signed)
Negative COVID results given. Patient results "NOT Detected." Caller expressed understanding. ° °

## 2020-08-16 DIAGNOSIS — E782 Mixed hyperlipidemia: Secondary | ICD-10-CM | POA: Diagnosis not present

## 2020-08-16 DIAGNOSIS — I10 Essential (primary) hypertension: Secondary | ICD-10-CM | POA: Diagnosis not present

## 2020-08-16 DIAGNOSIS — I2510 Atherosclerotic heart disease of native coronary artery without angina pectoris: Secondary | ICD-10-CM | POA: Diagnosis not present

## 2020-08-22 DIAGNOSIS — I739 Peripheral vascular disease, unspecified: Secondary | ICD-10-CM | POA: Diagnosis not present

## 2020-08-22 DIAGNOSIS — E1351 Other specified diabetes mellitus with diabetic peripheral angiopathy without gangrene: Secondary | ICD-10-CM | POA: Diagnosis not present

## 2020-08-22 DIAGNOSIS — L602 Onychogryphosis: Secondary | ICD-10-CM | POA: Diagnosis not present

## 2020-08-22 DIAGNOSIS — B351 Tinea unguium: Secondary | ICD-10-CM | POA: Diagnosis not present

## 2020-08-22 DIAGNOSIS — L84 Corns and callosities: Secondary | ICD-10-CM | POA: Diagnosis not present

## 2020-08-26 DIAGNOSIS — I10 Essential (primary) hypertension: Secondary | ICD-10-CM | POA: Diagnosis not present

## 2020-08-30 DIAGNOSIS — J439 Emphysema, unspecified: Secondary | ICD-10-CM | POA: Diagnosis not present

## 2020-08-30 DIAGNOSIS — I480 Paroxysmal atrial fibrillation: Secondary | ICD-10-CM | POA: Diagnosis not present

## 2020-08-30 DIAGNOSIS — D692 Other nonthrombocytopenic purpura: Secondary | ICD-10-CM | POA: Diagnosis not present

## 2020-08-30 DIAGNOSIS — E118 Type 2 diabetes mellitus with unspecified complications: Secondary | ICD-10-CM | POA: Diagnosis not present

## 2020-08-30 DIAGNOSIS — E782 Mixed hyperlipidemia: Secondary | ICD-10-CM | POA: Diagnosis not present

## 2020-08-30 DIAGNOSIS — E875 Hyperkalemia: Secondary | ICD-10-CM | POA: Diagnosis not present

## 2020-08-30 DIAGNOSIS — R0789 Other chest pain: Secondary | ICD-10-CM | POA: Diagnosis not present

## 2020-08-30 DIAGNOSIS — I10 Essential (primary) hypertension: Secondary | ICD-10-CM | POA: Diagnosis not present

## 2020-08-30 DIAGNOSIS — Z0001 Encounter for general adult medical examination with abnormal findings: Secondary | ICD-10-CM | POA: Diagnosis not present

## 2020-09-05 ENCOUNTER — Telehealth: Payer: Self-pay

## 2020-09-05 NOTE — Telephone Encounter (Signed)
Pt needs scheduling app. Progress notes on file sent from; [facility], phone [phone]. Copy of referral was sent to scheduling

## 2020-09-08 DIAGNOSIS — C44311 Basal cell carcinoma of skin of nose: Secondary | ICD-10-CM | POA: Diagnosis not present

## 2020-10-16 DIAGNOSIS — E782 Mixed hyperlipidemia: Secondary | ICD-10-CM | POA: Diagnosis not present

## 2020-10-16 DIAGNOSIS — I2510 Atherosclerotic heart disease of native coronary artery without angina pectoris: Secondary | ICD-10-CM | POA: Diagnosis not present

## 2020-11-16 ENCOUNTER — Telehealth: Payer: Self-pay | Admitting: Medical

## 2020-11-16 ENCOUNTER — Other Ambulatory Visit: Payer: Self-pay

## 2020-11-16 ENCOUNTER — Ambulatory Visit: Payer: Medicare Other | Admitting: Medical

## 2020-11-16 ENCOUNTER — Encounter: Payer: Self-pay | Admitting: Medical

## 2020-11-16 VITALS — BP 130/70 | HR 68 | Ht 65.0 in | Wt 143.2 lb

## 2020-11-16 DIAGNOSIS — E119 Type 2 diabetes mellitus without complications: Secondary | ICD-10-CM | POA: Diagnosis not present

## 2020-11-16 DIAGNOSIS — I480 Paroxysmal atrial fibrillation: Secondary | ICD-10-CM | POA: Diagnosis not present

## 2020-11-16 DIAGNOSIS — Z951 Presence of aortocoronary bypass graft: Secondary | ICD-10-CM

## 2020-11-16 DIAGNOSIS — I10 Essential (primary) hypertension: Secondary | ICD-10-CM | POA: Diagnosis not present

## 2020-11-16 DIAGNOSIS — E785 Hyperlipidemia, unspecified: Secondary | ICD-10-CM

## 2020-11-16 DIAGNOSIS — E782 Mixed hyperlipidemia: Secondary | ICD-10-CM | POA: Diagnosis not present

## 2020-11-16 DIAGNOSIS — I2510 Atherosclerotic heart disease of native coronary artery without angina pectoris: Secondary | ICD-10-CM | POA: Diagnosis not present

## 2020-11-16 DIAGNOSIS — E118 Type 2 diabetes mellitus with unspecified complications: Secondary | ICD-10-CM | POA: Diagnosis not present

## 2020-11-16 NOTE — Telephone Encounter (Signed)
   Pt's wife calling,  they wanted to know why the pt needs another lab work

## 2020-11-16 NOTE — Telephone Encounter (Signed)
Returned call to patient's wife left message on personal voice mail to call back to discuss.

## 2020-11-16 NOTE — Patient Instructions (Signed)
Medication Instructions:  The current medical regimen is effective;  continue present plan and medications as directed. Please refer to the Current Medication list given to you today.   *If you need a refill on your cardiac medications before your next appointment, please call your pharmacy*  Lab Work:   Testing/Procedures:  FASTING LIPID PANEL NONE  Follow-Up: Your next appointment:  12 month(s) In Person with You may see Pixie Casino, MD Roby Lofts, PA-C or one of the following Advanced Practice Providers on your designated Care Team:  Almyra Deforest, PA-C or Fabian Sharp, PA-C    [place]  Please call our office 2 months in advance to schedule this appointment   At [facility], you and your health needs are our priority.  As part of our continuing mission to provide you with exceptional heart care, we have created designated Provider Care Teams.  These Care Teams include your primary Cardiologist (physician) and Advanced Practice Providers (APPs -  Physician Assistants and Nurse Practitioners) who all work together to provide you with the care you need, when you need it.

## 2020-11-16 NOTE — Progress Notes (Signed)
Cardiology Office Note   Date:  11/16/2020   ID:  [name], DOB [birth year], MRN [id number]  PCP:  Jonathan Pretty, MD  Cardiologist:  Jonathan Casino, MD EP: Jonathan Meredith Leeds, MD  Chief Complaint  Patient presents with   Follow-up    CAD, Afib       History of Present Illness: [name] is a 80 y.o. male with a PMH of CAD s/p emergent CABG in 2008, paroxysmal atrial fibrillation, NSVT, HTN, HLD, DM type 2, COPD, who presents for routine follow-up.   He last saw [name] 02/2019 at which time he was doing well from a cardiac standpoint. He was last evaluated by cardiology at an outpatient visit with Jonathan Kilts, PA-C 03/2020 and again was doing very well without cardiac complaints. He was noted to have significant bruising on [name] and after discussion between patient and [name], anticoagulation was stopped in favor of aspirin '81mg'$  daily. He was felt to be a potential candidate for watchman device or Loop recorder, though patient preferred conservative management. His last ischemic evaluation was a NST in 2020 which showed EF 43% with fixed defect c/w prior MI in the anterior apical/apical segments. He was referred back to EP at that time for evaluation of possible "scar-mediated" VT. When seen by EP he was recommended for improved Afib control prior to assessing NSVT.   He presents today for routine follow-up. Overall he has continued to do well. He notes occasional SOB with carrying things up stairs but otherwise has no CP, SOB at rest, palpitations, orthopnea, PND, LE edema, or syncope. He was recently started on farxiga for diabetes management by his PCP office. He reports A1C today was 5.9. We discussed his trouble with anticoagulation in the past, suffering from severe bruising to his face after a fall for which his eliquis was discontinued. We discussed Watchman device again today but he continues to want to continue watchful waiting for recurrent Afib.    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   02/03/2016-respiratory allergy panel, environmental allergens to dust mites, cat dander, dog dander, Timothy grass, cockroach, fungus, cedar trees, ragweed, rough pigweed, mouse urine, IgE is 689 >>>Largest elevations are to cat dander, dog dander, dust mites  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)  06/27/2016-pulmonary function test-FVC 3.03 (87% predicted), postbronchodilator ratio 54, postbronc   Basal cell carcinoma 08/20/2016   left cheek-cx3 excision   BCC (basal cell carcinoma) 09/20/2016   left cheek-mohs   CAD (coronary artery disease)    cath & CABG in 2008   Cardiomyopathy, 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   COPD (chronic obstructive pulmonary disease) ([place])    COPD with acute exacerbation ([place]) 04/02/2018   Diabetes ([place])    type 2   DM2 (diabetes mellitus, type 2) ([place]) 02/05/2013   Dyslipidemia    Environmental allergies 02/03/2016   02/03/2016-respiratory allergy panel, environmental allergens to dust mites, cat dander, dog dander, Timothy grass, cockroach, fungus, cedar trees, ragweed, rough pigweed, mouse urine, IgE is 689 >>>Largest elevations are to cat dander, dog dander, dust mites  Essential hypertension 02/03/2016   Former smoker 05/14/2018   Former smoker Quit 2008 43-pack-year smoking history   History of nuclear stress test 10/08/2007   bruce myoview; normal scan with attenuation artifact; no ischemia/infarct; low risk    Hypertriglyceridemia  02/03/2016   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 Darcey Duran - LIMA to LAD, sequential saphenous vein graft to OM1 and OM 2, SVG to posterior descending (done emergently)     Past Surgical History:  Procedure Laterality Date   CARDIAC CATHETERIZATION  2003   total occlusion of LAD with left to left collaterals, mod disease of RCA & mild disease in Cfx (Dr. Loni Duran. 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 Prescott Duran)   NASAL SEPTOPLASTY W/ TURBINOPLASTY  1970s   PROSTATE BIOPSY     TONSILLECTOMY       Current Outpatient Medications  Medication Sig Dispense Refill   albuterol (VENTOLIN HFA) 108 (90 Base) MCG/ACT inhaler Inhale 2 puffs into the lungs every 6 (six) hours as needed for wheezing or shortness of breath. 8 g 2   Cyanocobalamin (VITAMIN B-12 SL) Place 1 tablet under the tongue daily.     dapagliflozin propanediol (FARXIGA) 5 MG TABS tablet Take by mouth daily.     diltiazem (CARDIZEM CD) 180 MG 24 hr capsule TAKE 1 CAPSULE(180 MG) BY MOUTH DAILY 90 capsule 3   folic acid (FOLVITE) 1 MG tablet Take 1 mg by mouth daily.     losartan (COZAAR) 25 MG tablet Take 25 mg by mouth daily. as directed  1   metFORMIN (GLUCOPHAGE) 500 MG tablet Take 500 mg by mouth 2 (two) times daily with a meal.      nitroGLYCERIN (NITROSTAT) 0.4 MG SL tablet Place 0.4 mg under the tongue every 5 (five) minutes x 3 doses as needed.     Omega-3 Fatty Acids (FISH OIL PO) Take 1 capsule by mouth at bedtime.     pravastatin (PRAVACHOL) 40 MG tablet Take 1 tablet (40 mg total) by mouth daily. 90 tablet 3   SYMBICORT 80-4.5 MCG/ACT inhaler INHALE 2 PUFFS INTO THE LUNGS IN THE MORNING AND AT BEDTIME 10.2 g 3   No current facility-administered medications for this  visit.    Allergies:   Lisinopril    Social History:  The patient  reports that he quit smoking about 13 years ago. His smoking use included cigarettes. He started smoking about 56 years ago. He has a 43.00 pack-year smoking history. He has never used smokeless tobacco. He reports current alcohol use. He reports that he does not use drugs.   Family History:  The patient's family history includes Alzheimer's disease in his father; CAD in his father; Diabetes in his mother; Heart attack (age of onset: 23) in his father; Stroke in his maternal grandmother.    ROS:  Please see the history of present illness.   Otherwise, review of systems are positive for none.   All other systems are reviewed and negative.    PHYSICAL EXAM: VS:  BP 130/70 (BP Location: Right Arm, Patient Position: Sitting, Cuff Size: Normal)   Pulse 68   Ht 5'  5" (1.651 m)   Wt 143 lb 3.2 oz (65 kg)   SpO2 97%   BMI 23.83 kg/m  , BMI Body mass index is 23.83 kg/m. GEN: Well nourished, well developed, in no acute distress HEENT: sclera anicteric Neck: no JVD, carotid bruits, or masses Cardiac: RRR; no murmurs, rubs, or gallops,no edema  Respiratory:  clear to auscultation bilaterally, normal work of breathing GI: soft, nontender, nondistended, + BS MS: no deformity or atrophy Skin: warm and dry, no rash Neuro:  Strength and sensation are intact Psych: euthymic mood, full affect   EKG:  EKG is ordered today. The ekg ordered today demonstrates sinus rhythm with PVCs, rate 68 bpm, no STE/D, no significant change from previous.    Recent Labs: No results found for requested labs within last 8760 hours.    Lipid Panel No results found for: CHOL, TRIG, HDL, CHOLHDL, VLDL, LDLCALC, LDLDIRECT    Wt Readings from Last 3 Encounters:  11/16/20 143 lb 3.2 oz (65 kg)  03/22/20 148 lb (67.1 kg)  12/04/19 143 lb 6.4 oz (65 kg)      Other studies Reviewed: Additional studies/ records that were reviewed today  include:   NST 2020: Nuclear stress EF: 43%. There was no ST segment deviation noted during stress. No T wave inversion was noted during stress. Findings consistent with prior myocardial infarction. This is a low risk study. The left ventricular ejection fraction is moderately decreased (30-44%).   There is a fixed perfusion defect of moderate severity present on rest and stress imaging, located in the anterior apical/apical segments.  This defect is of moderate size, comprising 5-8% of the LV.  A similar defect was reported on the prior study from 2016, and there appears to be no change.  This likely represents prior infarction.  There is no reversible ischemia on the study.  The ejection fraction is reported at 43%, which is likely the lower limits of normal for this modality of testing.  Wall motion appears normal on this study.  Overall, this is likely a low risk study with evidence of prior infarction that was seen in 2016, and no reversible ischemia.    Echocardiogram 2017: - Left ventricle: The cavity size was normal. There was mild    concentric hypertrophy. Systolic function was normal. The    estimated ejection fraction was in the range of 50% to 55%. Wall    motion was normal; there were no regional wall motion    abnormalities. Doppler parameters are consistent with abnormal    left ventricular relaxation (grade 1 diastolic dysfunction). The    E/e&' ratio is between 8-15, suggesting indeterminate LV filling    pressure./  - Mitral valve: Mildly thickened leaflets . There was mild    regurgitation.  - Left atrium: The atrium was normal in size.  - Right ventricle: The cavity size was mildly dilated. Systolic    function is mildly reduced.  - Right atrium: The atrium was mildly dilated.  - Tricuspid valve: There was trivial regurgitation.  - Pulmonary arteries: PA peak pressure: 28 mm Hg (S).  - Inferior vena cava: The vessel was normal in size. The    respirophasic diameter  changes were in the normal range (= 50%),    consistent with normal central venous pressure.   Impressions:   - LVEF 50-55%, mild LVH, basal inferior hypokinesis, diastolic    dysfunction, indeterminate LV filling pressure, normal LA size,    mild LAE, trivial TR, normal RVSP,  normal IVC    ASSESSMENT AND PLAN:  1. Paroxysmal atrial fibrillation: maintaining sinus rhythm today and has no complaints of recent palpitations. Has not been on anticoagulation due to bruising history and is currently not interested in pursuing Watchman device at this time, nor has he been interested in a Loop recorder for surveillance monitoring - Continue aspirin '81mg'$  daily - Continue diltiazem for rate control - Continue watchful waiting for recurrent Afib  2. CAD s/p CABG in 2008: he has chronic stable DOE but no complaints of chest pain. He is not on Bblocker due to underlying pulmonary disease according to previous notes - Continue aspirin  - Continue pravastatin and omega 3  3. HTN: BP 130/70 today - Continue diltiazem  4. HLD: LDL 78 08/2019 - Jonathan update FLP - Continue pravastatin and omega 3  5. DM type 2: A1C 5.9 on check at PCP today. He was recently started on farxiga with plans to taper off metformin - Continue metformin and farxiga per PCP   Current medicines are reviewed at length with the patient today.  The patient does not have concerns regarding medicines.  The following changes have been made:  As above  Labs/ tests ordered today include:   Orders Placed This Encounter  Procedures   Lipid Profile   EKG 12-Lead     Disposition:   FU with [name] 03/2021 and [name] in 1 year  Signed, Abigail Butts, PA-C  11/16/2020 5:57 PM

## 2020-11-17 NOTE — Telephone Encounter (Signed)
Pt's wife is returning call °

## 2020-11-17 NOTE — Telephone Encounter (Signed)
Spoke with pt wife, she reports the patient had lab work in June with dr Psychiatrist. Aware he will not need to repeat those labs and they will get [place] a copy.

## 2020-12-14 ENCOUNTER — Telehealth: Payer: Self-pay

## 2020-12-14 NOTE — Telephone Encounter (Signed)
NOTES SCANNED TO REFERRAL RJ 

## 2020-12-16 DIAGNOSIS — E782 Mixed hyperlipidemia: Secondary | ICD-10-CM | POA: Diagnosis not present

## 2020-12-16 DIAGNOSIS — I2510 Atherosclerotic heart disease of native coronary artery without angina pectoris: Secondary | ICD-10-CM | POA: Diagnosis not present

## 2020-12-16 DIAGNOSIS — Z23 Encounter for immunization: Secondary | ICD-10-CM | POA: Diagnosis not present

## 2020-12-16 DIAGNOSIS — I10 Essential (primary) hypertension: Secondary | ICD-10-CM | POA: Diagnosis not present

## 2020-12-20 ENCOUNTER — Other Ambulatory Visit: Payer: Self-pay

## 2020-12-20 ENCOUNTER — Ambulatory Visit: Payer: Medicare Other | Attending: Internal Medicine

## 2020-12-20 ENCOUNTER — Other Ambulatory Visit (HOSPITAL_BASED_OUTPATIENT_CLINIC_OR_DEPARTMENT_OTHER): Payer: Self-pay

## 2020-12-20 DIAGNOSIS — Z23 Encounter for immunization: Secondary | ICD-10-CM

## 2020-12-20 MED ORDER — MODERNA COVID-19 BIVAL BOOSTER 50 MCG/0.5ML IM SUSP
INTRAMUSCULAR | 0 refills | Status: DC
  Filled 2020-12-20: qty 0.5, 1d supply, fill #0

## 2020-12-20 NOTE — Progress Notes (Signed)
   Covid-19 Vaccination Clinic  Name:  [name]    MRN: [id number] DOB: 11-Sep-1940  12/20/2020  [name] was observed post Covid-19 immunization for 15 minutes without incident. He was provided with Vaccine Information Sheet and instruction to access the V-Safe system.   [name] was instructed to call 911 with any severe reactions post vaccine: Difficulty breathing  Swelling of face and throat  A fast heartbeat  A bad rash all over body  Dizziness and weakness

## 2021-01-03 DIAGNOSIS — E118 Type 2 diabetes mellitus with unspecified complications: Secondary | ICD-10-CM | POA: Diagnosis not present

## 2021-01-03 DIAGNOSIS — I10 Essential (primary) hypertension: Secondary | ICD-10-CM | POA: Diagnosis not present

## 2021-01-06 IMAGING — RF DG ESOPHAGUS
3 series · 11 of 11 positions shown · non-contrast
Comparison: None.

CLINICAL DATA: Oral phase dysphagia.

EXAM:
ESOPHOGRAM / BARIUM SWALLOW / BARIUM TABLET STUDY
TECHNIQUE: Combined double contrast and single contrast examination performed
using effervescent crystals, thick barium liquid, and thin barium
liquid. The patient was observed with fluoroscopy swallowing a 13 mm
barium sulphate tablet.
FLUOROSCOPY TIME:  Fluoroscopy Time:  1 minutes 18 seconds
Radiation Exposure Index (if provided by the fluoroscopic device):
14 mGy.

[Series 1: sequence · 4 of 46 frames shown (1 of 2)]
[frame 7/46]
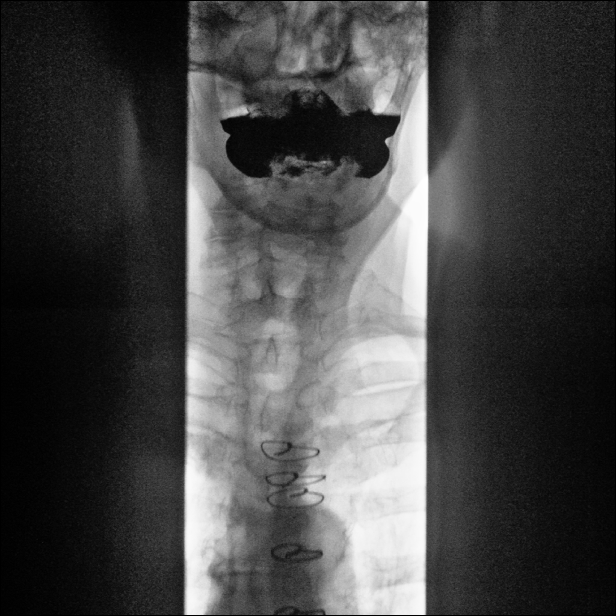
[frame 24/46]
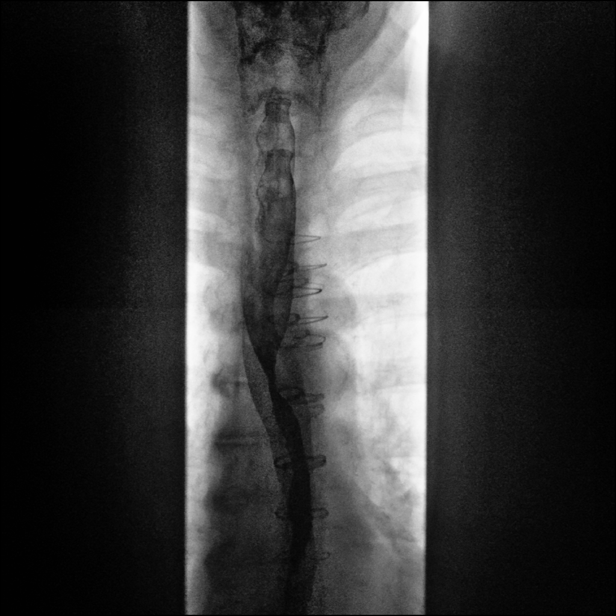
[frame 32/46]
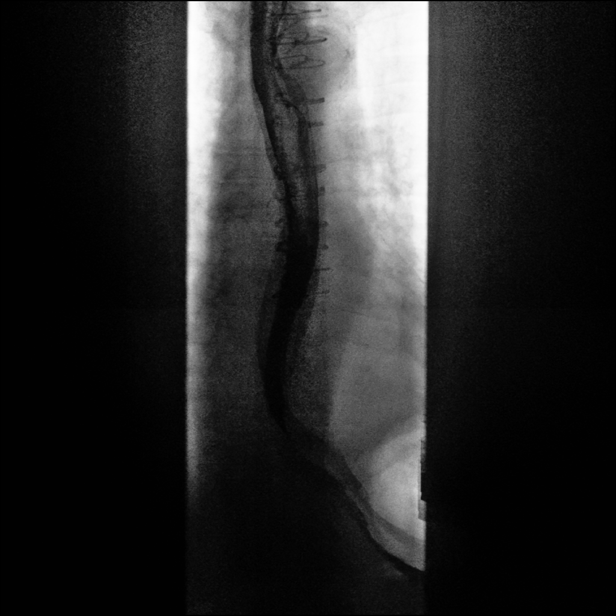
[frame 40/46]
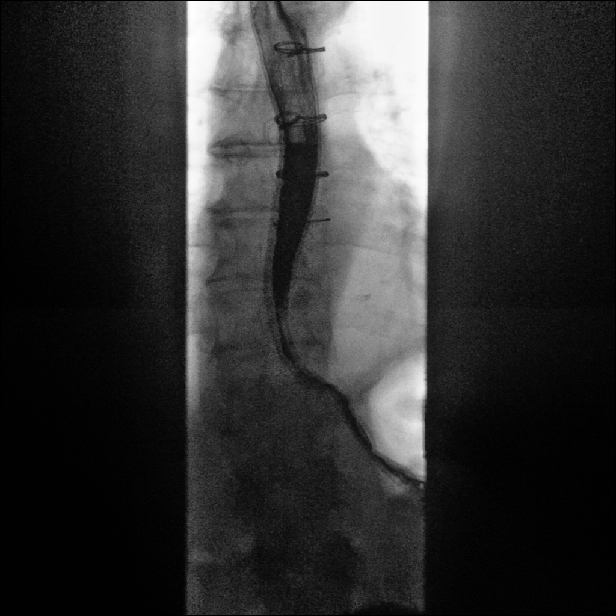

[Series 2: sequence · 4 of 46 frames shown (2 of 2)]
[frame 7/46]
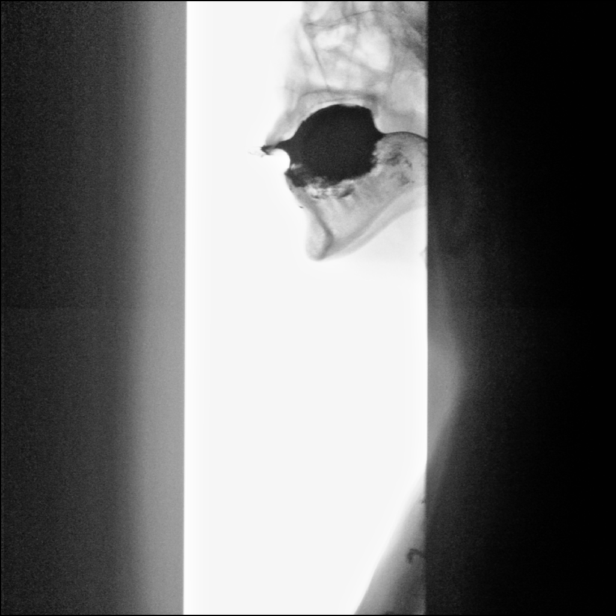
[frame 24/46]
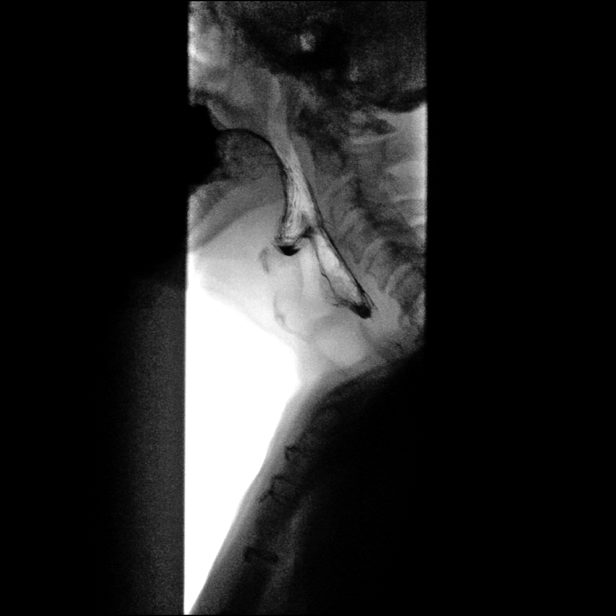
[frame 40/46]
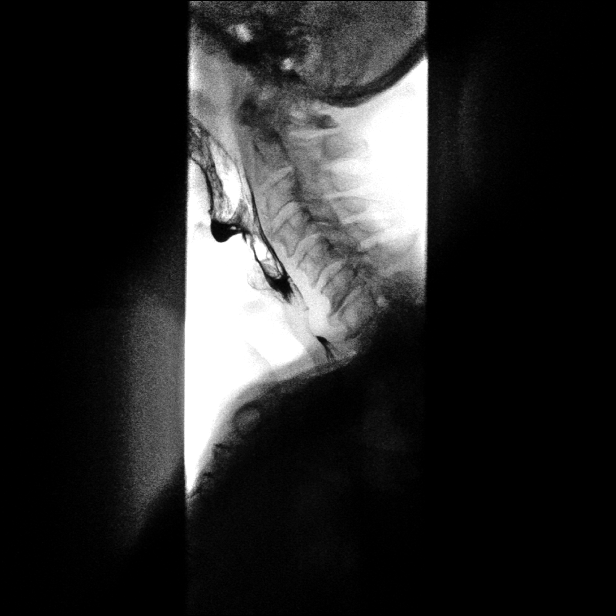
[frame 46/46]
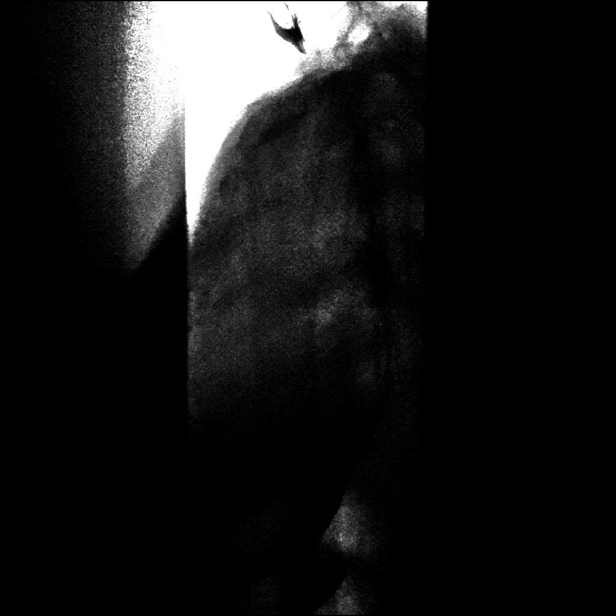

[Series 3: one shot · 0.14mm/px · 3 of 3 slices shown]
[im 1/3]
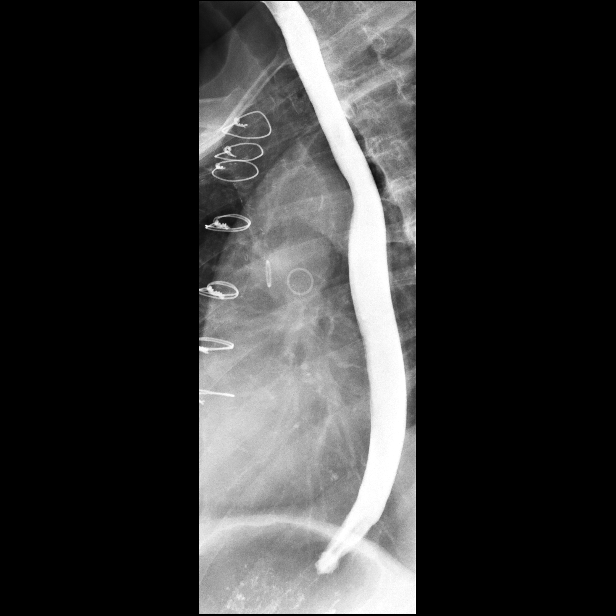
[im 2/3]
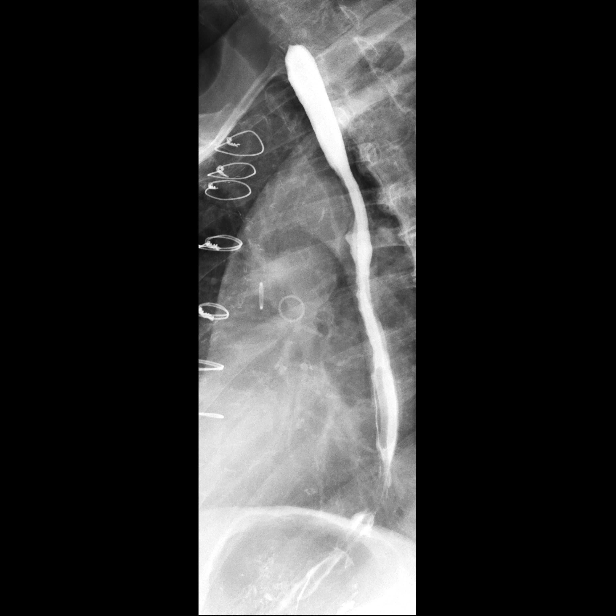
[im 3/3]
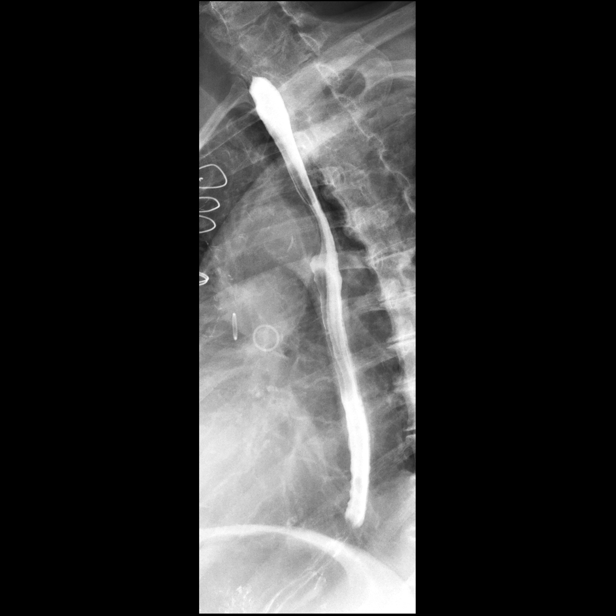

[11 of 11 positions shown; findings below may reference images not displayed]

FINDINGS: The oropharyngeal swallowing mechanisms are normal. There is no
aspiration or abnormal retention. The mucosa and motility of the
esophagus are normal. The 13 mm barium tablet passed immediately
from the mouth to the stomach with no delay. No hiatal hernia even
with Valsalva maneuver.
IMPRESSION: Normal barium esophagram.

## 2021-02-06 DIAGNOSIS — Z961 Presence of intraocular lens: Secondary | ICD-10-CM | POA: Diagnosis not present

## 2021-02-06 DIAGNOSIS — H43813 Vitreous degeneration, bilateral: Secondary | ICD-10-CM | POA: Diagnosis not present

## 2021-02-06 DIAGNOSIS — H02032 Senile entropion of right lower eyelid: Secondary | ICD-10-CM | POA: Diagnosis not present

## 2021-02-06 DIAGNOSIS — H26493 Other secondary cataract, bilateral: Secondary | ICD-10-CM | POA: Diagnosis not present

## 2021-02-06 DIAGNOSIS — H02035 Senile entropion of left lower eyelid: Secondary | ICD-10-CM | POA: Diagnosis not present

## 2021-02-06 DIAGNOSIS — H0102A Squamous blepharitis right eye, upper and lower eyelids: Secondary | ICD-10-CM | POA: Diagnosis not present

## 2021-02-06 DIAGNOSIS — H0102B Squamous blepharitis left eye, upper and lower eyelids: Secondary | ICD-10-CM | POA: Diagnosis not present

## 2021-02-06 DIAGNOSIS — E119 Type 2 diabetes mellitus without complications: Secondary | ICD-10-CM | POA: Diagnosis not present

## 2021-02-15 ENCOUNTER — Other Ambulatory Visit: Payer: Self-pay | Admitting: Pulmonary Disease

## 2021-02-15 ENCOUNTER — Telehealth: Payer: Self-pay | Admitting: Pulmonary Disease

## 2021-02-15 DIAGNOSIS — J449 Chronic obstructive pulmonary disease, unspecified: Secondary | ICD-10-CM

## 2021-02-15 NOTE — Telephone Encounter (Signed)
Called and LVM in regards to medication refill. Patient has not been seen in a year. However does have a follow up appt on 12/12, okay to send in 1 month supply to get him through to his appt. Patient can not have any other refills until his appt.  Will try calling again later.

## 2021-02-16 NOTE — Telephone Encounter (Signed)
On call- "emergency" request for Symbicort refill sent to [facility]

## 2021-02-27 ENCOUNTER — Other Ambulatory Visit: Payer: Self-pay

## 2021-02-27 ENCOUNTER — Ambulatory Visit (INDEPENDENT_AMBULATORY_CARE_PROVIDER_SITE_OTHER): Payer: Medicare Other | Admitting: Pulmonary Disease

## 2021-02-27 ENCOUNTER — Encounter: Payer: Self-pay | Admitting: Pulmonary Disease

## 2021-02-27 DIAGNOSIS — J449 Chronic obstructive pulmonary disease, unspecified: Secondary | ICD-10-CM | POA: Diagnosis not present

## 2021-02-27 DIAGNOSIS — R918 Other nonspecific abnormal finding of lung field: Secondary | ICD-10-CM

## 2021-02-27 NOTE — Patient Instructions (Signed)
  Continue on symbicort , rinse mouth after use

## 2021-02-27 NOTE — Assessment & Plan Note (Signed)
Due to positive bronchodilator response, he has been maintained on inhaled steroid/LABA combination.  This is worked well for him.  He had 1 episode of pneumonia in 2021 and has not had a recurrence since then.  As such I think a nasal steroid has helped him, although according to new gold guidelines,  he would benefit from LAMA/LABA combination, he has done well on existing medication and would not like to change.  I encouraged him to enroll inThe exercise program We discussed signs and symptoms of COPD exacerbation and he will call either [place] or PCP for antibiotic should he develop a case of bronchitis

## 2021-02-27 NOTE — Progress Notes (Signed)
   Subjective:    Patient ID: [name], male    DOB: 03-Oct-1940, 80 y.o.   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  43-pack-year smoking history.    He had an episode of pneumonia and 2020 but has done well since then. Last office visit was 11/2019.  Immunizations are up-to-date. He is still able to walk his dog and walk about a mile per day.  He is thinking about enrolling in an exercise program in January.  Admits that he uses Symbicort only once daily, does not need rescue inhaler much we reviewed CT scan from 2020   Significant tests/ events reviewed  CT chest 08/2018 Moderate centrilobular emphysema. 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 Also denies presyncope, palpitations, heartburn, abdominal pain, nausea, vomiting, diarrhea or change in bowel or urinary habits, dysuria,hematuria, rash, arthralgias, visual complaints, headache, numbness weakness or ataxia.     Objective:   Physical Exam  Gen. Pleasant, elderly,well-nourished, in no distress ENT - no thrush, no pallor/icterus,no post nasal drip Neck: No JVD, no thyromegaly, no carotid bruits Lungs: no use of accessory muscles,  no dullness to percussion, clear without rales or rhonchi  Cardiovascular: Rhythm regular, heart sounds  normal, no murmurs or gallops, no peripheral edema Musculoskeletal: No deformities, no cyanosis or clubbing        Assessment & Plan:

## 2021-02-27 NOTE — Assessment & Plan Note (Signed)
Platelike scarring in the right lung due to healed pneumonia.  No follow-up required

## 2021-03-07 NOTE — Telephone Encounter (Signed)
Pt had OV with Dr. [name] 12/12 and inhaler was taken care of. Closing encounter.

## 2021-03-15 DIAGNOSIS — E118 Type 2 diabetes mellitus with unspecified complications: Secondary | ICD-10-CM | POA: Diagnosis not present

## 2021-03-15 DIAGNOSIS — I480 Paroxysmal atrial fibrillation: Secondary | ICD-10-CM | POA: Diagnosis not present

## 2021-03-15 DIAGNOSIS — E782 Mixed hyperlipidemia: Secondary | ICD-10-CM | POA: Diagnosis not present

## 2021-03-15 DIAGNOSIS — I10 Essential (primary) hypertension: Secondary | ICD-10-CM | POA: Diagnosis not present

## 2021-03-15 DIAGNOSIS — Z951 Presence of aortocoronary bypass graft: Secondary | ICD-10-CM | POA: Diagnosis not present

## 2021-03-15 DIAGNOSIS — J449 Chronic obstructive pulmonary disease, unspecified: Secondary | ICD-10-CM | POA: Diagnosis not present

## 2021-05-17 ENCOUNTER — Ambulatory Visit: Payer: Medicare Other | Admitting: Dermatology

## 2021-05-17 ENCOUNTER — Encounter: Payer: Self-pay | Admitting: Dermatology

## 2021-05-17 ENCOUNTER — Other Ambulatory Visit: Payer: Self-pay

## 2021-05-17 DIAGNOSIS — I8312 Varicose veins of left lower extremity with inflammation: Secondary | ICD-10-CM | POA: Diagnosis not present

## 2021-05-17 DIAGNOSIS — I8310 Varicose veins of unspecified lower extremity with inflammation: Secondary | ICD-10-CM

## 2021-05-17 MED ORDER — CLOBETASOL PROPIONATE 0.05 % EX CREA
1.0000 | TOPICAL_CREAM | Freq: Two times a day (BID) | CUTANEOUS | 1 refills | Status: DC
Start: 2021-05-17 — End: 2022-02-06

## 2021-05-27 ENCOUNTER — Encounter: Payer: Self-pay | Admitting: Dermatology

## 2021-05-27 NOTE — Progress Notes (Signed)
? ?  Follow-Up Visit ?  ?Subjective  ?[name] is a 81 y.o. male who presents for the following: Rash (Rash above ankle on both legs x weeks- itching. Per pt he has used goldbond.). ? ?Rash on legs ?Location:  ?Duration:  ?Quality:  ?Associated Signs/Symptoms: ?Modifying Factors:  ?Severity:  ?Timing: ?Context:  ? ?Objective  ?Well appearing patient in no apparent distress; mood and affect are within normal limits. ?Left Lower Leg - Anterior ?Bilateral papular nummular eczematous dermatitis, some areas folliculocentric.  No significant pedal edema; intact pedal pulses.  This is a nonspecific pattern with a differential chiefly including nummular eczema) less likely) contact dermatitis. ? ? ? ? ? ? ? ? ?A focused examination was performed including arms and legs. Relevant physical exam findings are noted in the Assessment and Plan. ? ? ?Assessment & Plan  ? ? ?Stasis eczema ?Left Lower Leg - Anterior ? ?Clobetasol cream daily after bathing for 3 to 4 weeks.  Contact me if there is no improvement and we will consider patch testing/or biopsy. ? ?clobetasol cream (TEMOVATE) 0.05 % - Left Lower Leg - Anterior ?Apply 1 application topically 2 (two) times daily. Apply to rash 1-2 times daily ? ? ? ? ? ?I, [name], MD, have reviewed all documentation for this visit.  The documentation on 05/27/21 for the exam, diagnosis, procedures, and orders are all accurate and complete. ?

## 2021-06-08 DIAGNOSIS — J302 Other seasonal allergic rhinitis: Secondary | ICD-10-CM | POA: Diagnosis not present

## 2021-06-08 DIAGNOSIS — J441 Chronic obstructive pulmonary disease with (acute) exacerbation: Secondary | ICD-10-CM | POA: Diagnosis not present

## 2021-06-12 ENCOUNTER — Other Ambulatory Visit: Payer: Self-pay | Admitting: Cardiology

## 2021-06-22 ENCOUNTER — Ambulatory Visit (INDEPENDENT_AMBULATORY_CARE_PROVIDER_SITE_OTHER): Payer: Medicare Other

## 2021-06-22 ENCOUNTER — Ambulatory Visit: Payer: Medicare Other | Admitting: Pulmonary Disease

## 2021-06-22 ENCOUNTER — Encounter: Payer: Self-pay | Admitting: Pulmonary Disease

## 2021-06-22 VITALS — BP 144/68 | HR 78 | Temp 98.8°F | Ht 66.0 in | Wt 135.4 lb

## 2021-06-22 DIAGNOSIS — J440 Chronic obstructive pulmonary disease with acute lower respiratory infection: Secondary | ICD-10-CM

## 2021-06-22 DIAGNOSIS — J439 Emphysema, unspecified: Secondary | ICD-10-CM | POA: Diagnosis not present

## 2021-06-22 DIAGNOSIS — J449 Chronic obstructive pulmonary disease, unspecified: Secondary | ICD-10-CM

## 2021-06-22 DIAGNOSIS — J209 Acute bronchitis, unspecified: Secondary | ICD-10-CM | POA: Diagnosis not present

## 2021-06-22 DIAGNOSIS — J441 Chronic obstructive pulmonary disease with (acute) exacerbation: Secondary | ICD-10-CM | POA: Diagnosis not present

## 2021-06-22 DIAGNOSIS — Z951 Presence of aortocoronary bypass graft: Secondary | ICD-10-CM | POA: Diagnosis not present

## 2021-06-22 NOTE — Progress Notes (Signed)
? ?  Subjective:  ? ? Patient ID: [name], male    DOB: 1940/12/02, 81 y.o.   MRN: [id number] ? ?HPI ? 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  43-pack-year smoking history ? ?Chief Complaint  ?Patient presents with  ? Follow-up  ?  Pt is here for follow up. He states his cough is doing better. He states the pollen is making it worse but tries to avoid it as much as possible.   ? ?73-monthfollow-up visit. ?He reports increased congestion over the last 3 to 4 weeks, bringing up clear sputum now and this has improved.  He is taking OTC Claritin.  He relates this to the pollen season. ?No wheezing or nocturnal symptoms. ?He is compliant with Symbicort, has not needed albuterol for rescue too much. ?He reports weight loss from 143 pounds in December to 135 pounds now.  His wife cooks, appetite is normal ?Reviewed CT chest from 2020 ? ? ?Significant tests/ events reviewed ? ?CT chest 08/2018 Moderate centrilobular emphysema. Platelike scarring in the inferior right upper lobe  ?  ?PFT ?06/2016: FVC 3.03 L (87%) FEV1 1.61 L (65%) FEV1/FVC 0.53  positive bronchodilator response ?02/2016: FVC 2.74 L (78%) FEV1 1.49 L (60%) FEV1/FVC 0.54  positive bronchodilator response ?12/2015: FVC 3.23 L (92%) FEV1 1.47 L (58%) FEV1/FVC 0.45  TLC 8.31 L (133%) RV 203% ERV 218% DLCO uncorrected 45% ?  ?  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 ? ?Review of Systems ?neg for any significant sore throat, dysphagia, itching, sneezing, nasal congestion or excess/ purulent secretions, fever, chills, sweats, unintended wt loss, pleuritic or exertional cp, hempoptysis, orthopnea pnd or change in chronic leg swelling. Also denies presyncope, palpitations, heartburn, abdominal pain, nausea, vomiting, diarrhea or change in bowel or urinary habits, dysuria,hematuria, rash, arthralgias,  visual complaints, headache, numbness weakness or ataxia. ? ?   ?Objective:  ? Physical Exam ? ?Gen. Pleasant, elderly, in no distress ?ENT - no lesions, no post nasal drip ?Neck: No JVD, no thyromegaly, no carotid bruits ?Lungs: no use of accessory muscles, no dullness to percussion, decreased breath sounds on left base without rales or rhonchi  ?Cardiovascular: Rhythm regular, heart sounds  normal, no murmurs or gallops, no peripheral edema ?Musculoskeletal: No deformities, no cyanosis or clubbing , no tremors ? ? ? ?   ?Assessment & Plan:  ? ? ?

## 2021-06-22 NOTE — Patient Instructions (Signed)
?  X CXR today ? ?Continue on symbicort  ?

## 2021-06-22 NOTE — Assessment & Plan Note (Signed)
Mild exacerbation, related to pollen exposure.  Cough and congestion is improving.  He has decreased breath sounds on the left base, will obtain chest x-ray given his history of weight loss.  Reviewed last CT from 2020. ? ?We will continue on Symbicort.  He can use Mucinex as needed.  He will call us should his congestion be worse for course of prednisone ?

## 2021-06-22 NOTE — Assessment & Plan Note (Signed)
Previous history is suggestive of asthma and is maintained on Symbicort rather than LAMA/LABA ?

## 2021-07-12 ENCOUNTER — Ambulatory Visit: Payer: Medicare Other | Admitting: Dermatology

## 2021-07-12 ENCOUNTER — Encounter: Payer: Self-pay | Admitting: Dermatology

## 2021-07-12 DIAGNOSIS — L309 Dermatitis, unspecified: Secondary | ICD-10-CM | POA: Diagnosis not present

## 2021-07-12 DIAGNOSIS — J3489 Other specified disorders of nose and nasal sinuses: Secondary | ICD-10-CM

## 2021-07-17 DIAGNOSIS — L03113 Cellulitis of right upper limb: Secondary | ICD-10-CM | POA: Diagnosis not present

## 2021-07-17 DIAGNOSIS — S61451A Open bite of right hand, initial encounter: Secondary | ICD-10-CM | POA: Diagnosis not present

## 2021-07-20 DIAGNOSIS — L03113 Cellulitis of right upper limb: Secondary | ICD-10-CM | POA: Diagnosis not present

## 2021-07-20 DIAGNOSIS — W540XXA Bitten by dog, initial encounter: Secondary | ICD-10-CM | POA: Diagnosis not present

## 2021-07-30 NOTE — Progress Notes (Signed)
? ?  Follow-Up Visit ?  ?Subjective  ?[name] is a 81 y.o. male who presents for the following: Follow-up (Rash on both calves- better- the itch is gone- tx- mupircoin). ? ?Recheck rash legs, sore nose ?Location:  ?Duration:  ?Quality:  ?Associated Signs/Symptoms: ?Modifying Factors:  ?Severity:  ?Timing: ?Context:  ? ?Objective  ?Well appearing patient in no apparent distress; mood and affect are within normal limits. ?Mid Tip of Nose ?1 mm inflammatory papule, historically new ? ?Left Lower Leg - Posterior, Right Lower Leg - Posterior ?Active inflammation essentially clear, precise cause of impetiginized rash remains uncertain ? ? ? ?A focused examination was performed including legs, face.. Relevant physical exam findings are noted in the Assessment and Plan. ? ? ?Assessment & Plan  ? ? ?Sore nose ?Mid Tip of Nose ? ?He uses a mupirocin ointment from his legs on his nose twice daily for 1 week; follow-up by phone or MyChart at that time ? ?Dermatitis ?Left Lower Leg - Posterior; Right Lower Leg - Posterior ? ? ? ? ? ?I, [name], MD, have reviewed all documentation for this visit.  The documentation on 07/30/21 for the exam, diagnosis, procedures, and orders are all accurate and complete. ?

## 2021-08-16 DIAGNOSIS — E782 Mixed hyperlipidemia: Secondary | ICD-10-CM | POA: Diagnosis not present

## 2021-08-16 DIAGNOSIS — I10 Essential (primary) hypertension: Secondary | ICD-10-CM | POA: Diagnosis not present

## 2021-08-16 DIAGNOSIS — I2510 Atherosclerotic heart disease of native coronary artery without angina pectoris: Secondary | ICD-10-CM | POA: Diagnosis not present

## 2021-08-31 DIAGNOSIS — E119 Type 2 diabetes mellitus without complications: Secondary | ICD-10-CM | POA: Diagnosis not present

## 2021-08-31 DIAGNOSIS — I10 Essential (primary) hypertension: Secondary | ICD-10-CM | POA: Diagnosis not present

## 2021-09-04 DIAGNOSIS — I480 Paroxysmal atrial fibrillation: Secondary | ICD-10-CM | POA: Diagnosis not present

## 2021-09-04 DIAGNOSIS — I10 Essential (primary) hypertension: Secondary | ICD-10-CM | POA: Diagnosis not present

## 2021-09-04 DIAGNOSIS — Z0000 Encounter for general adult medical examination without abnormal findings: Secondary | ICD-10-CM | POA: Diagnosis not present

## 2021-09-04 DIAGNOSIS — D692 Other nonthrombocytopenic purpura: Secondary | ICD-10-CM | POA: Diagnosis not present

## 2021-09-04 DIAGNOSIS — R2689 Other abnormalities of gait and mobility: Secondary | ICD-10-CM | POA: Diagnosis not present

## 2021-09-04 DIAGNOSIS — J439 Emphysema, unspecified: Secondary | ICD-10-CM | POA: Diagnosis not present

## 2021-09-04 DIAGNOSIS — Z23 Encounter for immunization: Secondary | ICD-10-CM | POA: Diagnosis not present

## 2021-09-04 DIAGNOSIS — J302 Other seasonal allergic rhinitis: Secondary | ICD-10-CM | POA: Diagnosis not present

## 2021-09-04 DIAGNOSIS — I2510 Atherosclerotic heart disease of native coronary artery without angina pectoris: Secondary | ICD-10-CM | POA: Diagnosis not present

## 2021-09-04 DIAGNOSIS — E118 Type 2 diabetes mellitus with unspecified complications: Secondary | ICD-10-CM | POA: Diagnosis not present

## 2021-09-04 DIAGNOSIS — R634 Abnormal weight loss: Secondary | ICD-10-CM | POA: Diagnosis not present

## 2021-09-05 DIAGNOSIS — M109 Gout, unspecified: Secondary | ICD-10-CM | POA: Diagnosis not present

## 2021-09-27 DIAGNOSIS — M25472 Effusion, left ankle: Secondary | ICD-10-CM | POA: Diagnosis not present

## 2021-09-27 DIAGNOSIS — M25672 Stiffness of left ankle, not elsewhere classified: Secondary | ICD-10-CM | POA: Diagnosis not present

## 2021-09-27 DIAGNOSIS — M25572 Pain in left ankle and joints of left foot: Secondary | ICD-10-CM | POA: Diagnosis not present

## 2021-09-27 DIAGNOSIS — R260 Ataxic gait: Secondary | ICD-10-CM | POA: Diagnosis not present

## 2021-10-04 DIAGNOSIS — M25472 Effusion, left ankle: Secondary | ICD-10-CM | POA: Diagnosis not present

## 2021-10-04 DIAGNOSIS — M25572 Pain in left ankle and joints of left foot: Secondary | ICD-10-CM | POA: Diagnosis not present

## 2021-10-04 DIAGNOSIS — M25672 Stiffness of left ankle, not elsewhere classified: Secondary | ICD-10-CM | POA: Diagnosis not present

## 2021-10-04 DIAGNOSIS — R260 Ataxic gait: Secondary | ICD-10-CM | POA: Diagnosis not present

## 2021-10-11 DIAGNOSIS — M25472 Effusion, left ankle: Secondary | ICD-10-CM | POA: Diagnosis not present

## 2021-10-11 DIAGNOSIS — M25572 Pain in left ankle and joints of left foot: Secondary | ICD-10-CM | POA: Diagnosis not present

## 2021-10-11 DIAGNOSIS — R260 Ataxic gait: Secondary | ICD-10-CM | POA: Diagnosis not present

## 2021-10-11 DIAGNOSIS — M25672 Stiffness of left ankle, not elsewhere classified: Secondary | ICD-10-CM | POA: Diagnosis not present

## 2021-10-18 DIAGNOSIS — M25672 Stiffness of left ankle, not elsewhere classified: Secondary | ICD-10-CM | POA: Diagnosis not present

## 2021-10-18 DIAGNOSIS — M25572 Pain in left ankle and joints of left foot: Secondary | ICD-10-CM | POA: Diagnosis not present

## 2021-10-18 DIAGNOSIS — R260 Ataxic gait: Secondary | ICD-10-CM | POA: Diagnosis not present

## 2021-10-18 DIAGNOSIS — M25472 Effusion, left ankle: Secondary | ICD-10-CM | POA: Diagnosis not present

## 2021-10-25 DIAGNOSIS — M25672 Stiffness of left ankle, not elsewhere classified: Secondary | ICD-10-CM | POA: Diagnosis not present

## 2021-10-25 DIAGNOSIS — M25472 Effusion, left ankle: Secondary | ICD-10-CM | POA: Diagnosis not present

## 2021-10-25 DIAGNOSIS — M25572 Pain in left ankle and joints of left foot: Secondary | ICD-10-CM | POA: Diagnosis not present

## 2021-10-25 DIAGNOSIS — R260 Ataxic gait: Secondary | ICD-10-CM | POA: Diagnosis not present

## 2021-11-01 DIAGNOSIS — R260 Ataxic gait: Secondary | ICD-10-CM | POA: Diagnosis not present

## 2021-11-01 DIAGNOSIS — M25672 Stiffness of left ankle, not elsewhere classified: Secondary | ICD-10-CM | POA: Diagnosis not present

## 2021-11-01 DIAGNOSIS — M25572 Pain in left ankle and joints of left foot: Secondary | ICD-10-CM | POA: Diagnosis not present

## 2021-11-01 DIAGNOSIS — M25472 Effusion, left ankle: Secondary | ICD-10-CM | POA: Diagnosis not present

## 2021-11-06 DIAGNOSIS — R234 Changes in skin texture: Secondary | ICD-10-CM | POA: Diagnosis not present

## 2021-11-06 DIAGNOSIS — L570 Actinic keratosis: Secondary | ICD-10-CM | POA: Diagnosis not present

## 2021-11-08 DIAGNOSIS — M25672 Stiffness of left ankle, not elsewhere classified: Secondary | ICD-10-CM | POA: Diagnosis not present

## 2021-11-08 DIAGNOSIS — R260 Ataxic gait: Secondary | ICD-10-CM | POA: Diagnosis not present

## 2021-11-08 DIAGNOSIS — M25572 Pain in left ankle and joints of left foot: Secondary | ICD-10-CM | POA: Diagnosis not present

## 2021-11-08 DIAGNOSIS — M25472 Effusion, left ankle: Secondary | ICD-10-CM | POA: Diagnosis not present

## 2021-11-08 DIAGNOSIS — R058 Other specified cough: Secondary | ICD-10-CM | POA: Diagnosis not present

## 2021-11-15 DIAGNOSIS — M25672 Stiffness of left ankle, not elsewhere classified: Secondary | ICD-10-CM | POA: Diagnosis not present

## 2021-11-15 DIAGNOSIS — M25472 Effusion, left ankle: Secondary | ICD-10-CM | POA: Diagnosis not present

## 2021-11-15 DIAGNOSIS — R260 Ataxic gait: Secondary | ICD-10-CM | POA: Diagnosis not present

## 2021-11-15 DIAGNOSIS — M25572 Pain in left ankle and joints of left foot: Secondary | ICD-10-CM | POA: Diagnosis not present

## 2021-11-16 DIAGNOSIS — B351 Tinea unguium: Secondary | ICD-10-CM | POA: Diagnosis not present

## 2021-11-16 DIAGNOSIS — L851 Acquired keratosis [keratoderma] palmaris et plantaris: Secondary | ICD-10-CM | POA: Diagnosis not present

## 2021-11-22 DIAGNOSIS — R260 Ataxic gait: Secondary | ICD-10-CM | POA: Diagnosis not present

## 2021-11-22 DIAGNOSIS — M25572 Pain in left ankle and joints of left foot: Secondary | ICD-10-CM | POA: Diagnosis not present

## 2021-11-22 DIAGNOSIS — M25472 Effusion, left ankle: Secondary | ICD-10-CM | POA: Diagnosis not present

## 2021-11-22 DIAGNOSIS — M25672 Stiffness of left ankle, not elsewhere classified: Secondary | ICD-10-CM | POA: Diagnosis not present

## 2021-11-28 DIAGNOSIS — S81811A Laceration without foreign body, right lower leg, initial encounter: Secondary | ICD-10-CM | POA: Diagnosis not present

## 2021-11-29 DIAGNOSIS — M25472 Effusion, left ankle: Secondary | ICD-10-CM | POA: Diagnosis not present

## 2021-11-29 DIAGNOSIS — R260 Ataxic gait: Secondary | ICD-10-CM | POA: Diagnosis not present

## 2021-11-29 DIAGNOSIS — M25672 Stiffness of left ankle, not elsewhere classified: Secondary | ICD-10-CM | POA: Diagnosis not present

## 2021-11-29 DIAGNOSIS — M25572 Pain in left ankle and joints of left foot: Secondary | ICD-10-CM | POA: Diagnosis not present

## 2021-12-01 DIAGNOSIS — R350 Frequency of micturition: Secondary | ICD-10-CM | POA: Diagnosis not present

## 2021-12-06 DIAGNOSIS — I10 Essential (primary) hypertension: Secondary | ICD-10-CM | POA: Diagnosis not present

## 2021-12-06 DIAGNOSIS — R2689 Other abnormalities of gait and mobility: Secondary | ICD-10-CM | POA: Diagnosis not present

## 2021-12-06 DIAGNOSIS — R634 Abnormal weight loss: Secondary | ICD-10-CM | POA: Diagnosis not present

## 2021-12-11 ENCOUNTER — Other Ambulatory Visit: Payer: Self-pay | Admitting: Cardiology

## 2021-12-13 DIAGNOSIS — R260 Ataxic gait: Secondary | ICD-10-CM | POA: Diagnosis not present

## 2021-12-13 DIAGNOSIS — M25572 Pain in left ankle and joints of left foot: Secondary | ICD-10-CM | POA: Diagnosis not present

## 2021-12-13 DIAGNOSIS — M25472 Effusion, left ankle: Secondary | ICD-10-CM | POA: Diagnosis not present

## 2021-12-13 DIAGNOSIS — M25672 Stiffness of left ankle, not elsewhere classified: Secondary | ICD-10-CM | POA: Diagnosis not present

## 2021-12-14 DIAGNOSIS — R238 Other skin changes: Secondary | ICD-10-CM | POA: Diagnosis not present

## 2021-12-15 DIAGNOSIS — R233 Spontaneous ecchymoses: Secondary | ICD-10-CM | POA: Diagnosis not present

## 2021-12-20 DIAGNOSIS — M25572 Pain in left ankle and joints of left foot: Secondary | ICD-10-CM | POA: Diagnosis not present

## 2021-12-20 DIAGNOSIS — M25672 Stiffness of left ankle, not elsewhere classified: Secondary | ICD-10-CM | POA: Diagnosis not present

## 2021-12-20 DIAGNOSIS — M25472 Effusion, left ankle: Secondary | ICD-10-CM | POA: Diagnosis not present

## 2021-12-20 DIAGNOSIS — R260 Ataxic gait: Secondary | ICD-10-CM | POA: Diagnosis not present

## 2021-12-22 ENCOUNTER — Ambulatory Visit (INDEPENDENT_AMBULATORY_CARE_PROVIDER_SITE_OTHER): Payer: Medicare Other

## 2021-12-22 ENCOUNTER — Ambulatory Visit: Payer: Medicare Other | Admitting: Nurse Practitioner

## 2021-12-22 ENCOUNTER — Encounter: Payer: Self-pay | Admitting: Nurse Practitioner

## 2021-12-22 VITALS — BP 120/70 | HR 77 | Temp 98.1°F | Ht 65.0 in | Wt 140.8 lb

## 2021-12-22 DIAGNOSIS — Z23 Encounter for immunization: Secondary | ICD-10-CM | POA: Diagnosis not present

## 2021-12-22 DIAGNOSIS — R059 Cough, unspecified: Secondary | ICD-10-CM | POA: Diagnosis not present

## 2021-12-22 DIAGNOSIS — J441 Chronic obstructive pulmonary disease with (acute) exacerbation: Secondary | ICD-10-CM

## 2021-12-22 DIAGNOSIS — J309 Allergic rhinitis, unspecified: Secondary | ICD-10-CM | POA: Insufficient documentation

## 2021-12-22 DIAGNOSIS — J45901 Unspecified asthma with (acute) exacerbation: Secondary | ICD-10-CM

## 2021-12-22 DIAGNOSIS — J302 Other seasonal allergic rhinitis: Secondary | ICD-10-CM | POA: Diagnosis not present

## 2021-12-22 MED ORDER — FLUTICASONE PROPIONATE 50 MCG/ACT NA SUSP: 1.0000 | Freq: Every day | NASAL | 2 refills | Status: DC

## 2021-12-22 MED ORDER — PREDNISONE 10 MG PO TABS: ORAL_TABLET | ORAL | 0 refills | Status: DC

## 2021-12-22 MED ORDER — AZITHROMYCIN 250 MG PO TABS: ORAL_TABLET | ORAL | 0 refills | Status: DC

## 2021-12-22 NOTE — Patient Instructions (Addendum)
Continue Symbicort 2 puffs Twice daily. Brush tongue and rinse mouth afterwards Continue Albuterol inhaler 2 puffs every 6 hours as needed for shortness of breath or wheezing. Notify if symptoms persist despite rescue inhaler/neb use.   Flonase nasal spray 2 sprays each nostril daily Z pack - take 2 tabs on day one then 1 tab daily for four additional days Prednisone taper. 4 tabs for 2 days, then 3 tabs for 2 days, 2 tabs for 2 days, then 1 tab for 2 days, then stop. Take in AM with food Mucinex 600 mg Twice daily until chest congestion/cough improves Over the counter antihistamine such as zyrtec, xyzal, claritin or allegra  Chest x ray   Follow up in 2 weeks with Dr. [name] or Alanson Aly. If symptoms do not improve or worsen, please contact office for sooner follow up or seek emergency care.

## 2021-12-22 NOTE — Progress Notes (Signed)
_0  ID: [name], male    DOB: [birth year], 81 y.o.   MRN: [id number]  Chief Complaint  Patient presents with   Follow-up    Referring provider: [name], MD  HPI: 81 year old male, former smoker followed for asthma/COPD overlap.  He is a patient Dr. [name] and last seen in office 06/22/2021.  Past medical history significant for CAD status post CABG x4, hypertension, ischemic cardiomyopathy, DM 2, HLD, environmental allergies.  TEST/EVENTS:  11/28/2015 PFT: FVC 92, FEV1 58, ratio 45, TLC 133, DLCO 45 06/27/2016 PFT: FVC 87, FEV1 65, ratio 54+BD  05/20/2018 CT chest: There is a nodular area of consolidation in the peribronchovascular area and hazy airspace opacities in the right lower lobe, may reflect multifocal bronchopneumonia for cryptogenic organizing pneumonia.  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 08/22/2018 CT chest without contrast: Previously seen density in the right upper lobe has improved, residual compatible with scarring.  06/22/2021: OV with Dr. [name].  Seems to be having a mild exacerbation related to pollen exposure.  Cough and congestion is improving.  He has decreased his breath sounds on the left base; CXR ordered for further evaluation which was stable.  Advised that he continue Symbicort and use Mucinex as needed.  Instructed to contact if symptoms worsen for course of prednisone.  Follow-up 6 months.  12/22/2021: Today-follow-up Patient presents today for intended follow-up however he is also having some acute symptoms.  He first developed a increased productive cough around 2 months ago.  He describes the sputum as gray.  Feels like his cough waxes and wanes.  Could be related to allergies per his report. He doesn't have any significant congestion or drainage; occasionally feels like he has postnasal drip. He has had some recent increase in wheezing and feels slightly more short winded than normal.  Feels like activities around the house have been a little more  difficult for him over the past few weeks to a month.  He denies any fevers, chills, hemoptysis, lower extremity swelling, orthopnea.  No known sick exposures.  Did not feel like he had any URI symptoms that onset his cough.  He is using Symbicort daily.  Rarely uses his albuterol rescue.  Not taking anything over-the-counter for cough or congestion.  Allergies  Allergen Reactions   Lisinopril Other (See Comments)    Immunization History  Administered Date(s) Administered   Influenza, High Dose Seasonal PF 11/28/2015, 01/03/2016, 12/17/2016, 11/30/2017, 12/04/2019   Influenza, Quadrivalent, Recombinant, Inj, Pf 12/06/2017, 12/19/2018, 12/16/2020   Influenza,inj,quad, With Preservative 12/17/2017   Influenza-Unspecified 01/11/2011, 01/11/2012   Moderna Covid-19 Vaccine Bivalent Booster 35yr & up 12/20/2020   Moderna SARS-COV2 Booster Vaccination 01/12/2020   Pneumococcal Conjugate-13 07/24/2011, 06/23/2015, 03/22/2016, 03/08/2017   Pneumococcal Polysaccharide-23 07/07/2009   Td 07/24/2011   Zoster Recombinat (Shingrix) 12/07/2016, 02/16/2017, 03/08/2017    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   02/03/2016-respiratory allergy panel, environmental allergens to dust mites, cat dander, dog dander, Timothy grass, cockroach, fungus, cedar trees, ragweed, rough pigweed, mouse urine, IgE is 689 >>>Largest elevations are to cat dander, dog dander, dust  mites  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)   06/27/2016-pulmonary function test-FVC 3.03 (87% predicted), postbronchodilator ratio 54, postbronc   Basal cell carcinoma 08/20/2016   left cheek-cx3 excision   BCC (basal cell carcinoma) 09/20/2016   left cheek-mohs   CAD (coronary artery disease)    cath & CABG in 2008   Cardiomyopathy, 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   COPD (chronic obstructive pulmonary disease) ([place])    COPD with acute exacerbation ([place]) 04/02/2018   Diabetes ([place])    type 2   DM2 (diabetes mellitus, type 2) ([place]) 02/05/2013   Dyslipidemia    Environmental allergies 02/03/2016   02/03/2016-respiratory allergy panel, environmental allergens to dust mites, cat dander, dog dander, Timothy grass, cockroach, fungus, cedar trees, ragweed, rough pigweed, mouse urine, IgE is 689 >>>Largest elevations are to cat dander, dog dander, dust mites    Essential hypertension 02/03/2016   Former smoker 05/14/2018   Former smoker Quit 2008 43-pack-year smoking history   History of nuclear stress test 10/08/2007   bruce myoview; normal scan with attenuation artifact; no ischemia/infarct; low risk    Hypertriglyceridemia 02/03/2016   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 [name] - LIMA to LAD, sequential saphenous vein graft to OM1 and OM 2, SVG to posterior descending (done emergently)     Tobacco History: Social History   Tobacco Use  Smoking Status Former   Packs/day: 1.00   Years: 43.00   Total pack years: 43.00   Types: Cigarettes   Start date: 12/25/1963   Quit date: 01/31/2007   Years since quitting: 14.9  Smokeless Tobacco Never   Counseling given: Not Answered   Outpatient Medications Prior to Visit  Medication Sig Dispense Refill   budesonide-formoterol (SYMBICORT) 80-4.5 MCG/ACT inhaler INHALE 2 PUFFS INTO THE LUNGS IN THE MORNING AND AT BEDTIME 10.2 g 3   clobetasol cream (TEMOVATE)  5.59 % Apply 1 application topically 2 (two) times daily. Apply to rash 1-2 times daily 60 g 1   COVID-19 mRNA bivalent vaccine, Moderna, (MODERNA COVID-19 BIVAL BOOSTER) 50 MCG/0.5ML injection Inject into the muscle. 0.5 mL 0   Cyanocobalamin (VITAMIN B-12 SL) Place 1 tablet under the tongue daily.     dapagliflozin propanediol (FARXIGA) 5 MG TABS tablet Take by mouth daily.     diltiazem (CARDIZEM CD) 180 MG 24 hr capsule TAKE 1 CAPSULE(180 MG) BY MOUTH DAILY 15 capsule 0   folic acid (FOLVITE) 1 MG tablet Take 1 mg by mouth daily.     losartan (COZAAR) 25 MG tablet Take 25 mg by mouth daily. as directed  1   metFORMIN (GLUCOPHAGE) 500 MG tablet Take 500 mg by mouth 2 (two) times daily with a meal.      nitroGLYCERIN (NITROSTAT) 0.4 MG SL tablet Place 0.4 mg under the tongue every 5 (five) minutes x 3 doses as needed.     Omega-3 Fatty Acids (FISH OIL PO) Take 1 capsule by mouth at bedtime.     pravastatin (PRAVACHOL) 40 MG tablet Take 1 tablet (40 mg total) by mouth daily. 90 tablet 3   albuterol (VENTOLIN HFA) 108 (90 Base) MCG/ACT inhaler Inhale 2 puffs into the lungs every 6 (six) hours as needed for wheezing or shortness of breath. (Patient not taking: Reported on 07/12/2021) 8 g 2   No facility-administered medications prior to visit.  Review of Systems:   Constitutional: No weight loss or gain, night sweats, fevers, chills, fatigue, or lassitude. HEENT: No headaches, difficulty swallowing, tooth/dental problems, or sore throat. +sneezing. No itching, ear ache. + nasal congestion, post nasal drip CV:  No chest pain, orthopnea, PND, swelling in lower extremities, anasarca, dizziness, palpitations, syncope Resp: +shortness of breath with exertion or at rest.  + productive cough. No hemoptysis.+ wheezing.  No chest wall deformity GI:  No heartburn, indigestion, abdominal pain, nausea, vomiting, diarrhea, change in bowel habits, loss of appetite, bloody stools.  Neuro: No dizziness or  lightheadedness.  Psych: No depression or anxiety. Mood stable.     Physical Exam:  BP 120/70 (BP Location: Right Arm, Patient Position: Sitting, Cuff Size: Normal)   Pulse 77   Temp 98.1 F (36.7 C) (Oral)   Ht _0  (1.651 m)   Wt 140 lb 12.8 oz (63.9 kg)   SpO2 96%   BMI 23.43 kg/m   GEN: Pleasant, interactive, well-appearing; elderly; in no acute distress. HEENT:  Normocephalic and atraumatic. PERRLA. Sclera white. Nasal turbinates pink, moist and patent bilaterally. No rhinorrhea present. Oropharynx pink and moist, without exudate or edema. No lesions, ulcerations, or postnasal drip.  NECK:  Supple w/ fair ROM. No JVD present. No lymphadenopathy.   CV: RRR, no m/r/g, no peripheral edema. Pulses intact, +2 bilaterally. No cyanosis, pallor or clubbing. PULMONARY:  Unlabored, regular breathing. Diminished bilaterally with mild expiratory wheezes A&P. No accessory muscle use.  GI: BS present and normoactive. Soft, non-tender to palpation. No organomegaly or masses detected.  MSK: No erythema, warmth or tenderness. Cap refil <2 sec all extrem. No deformities or joint swelling noted.  Neuro: A/Ox3. No focal deficits noted.   Skin: Warm, no lesions or rashe Psych: Normal affect and behavior. Judgement and thought content appropriate.     Lab Results:  CBC    Component Value Date/Time   WBC 8.0 07/10/2008 0855   RBC 4.42 07/10/2008 0855   HGB 14.6 09/16/2015 1749   HCT 43.0 09/16/2015 1749   PLT 299 07/10/2008 0855   MCV 89.0 07/10/2008 0855   MCHC 34.2 07/10/2008 0855   RDW 12.7 07/10/2008 0855   LYMPHSABS 2.0 07/04/2008 0358   MONOABS 0.7 07/04/2008 0358   EOSABS 0.3 07/04/2008 0358   BASOSABS 0.0 07/04/2008 0358    BMET    Component Value Date/Time   NA 140 12/28/2016 1014   K 4.6 12/28/2016 1014   CL 105 12/28/2016 1014   CO2 28 12/28/2016 1014   GLUCOSE 127 (H) 12/28/2016 1014   BUN 16 12/28/2016 1014   CREATININE 0.75 12/28/2016 1014   CALCIUM 8.9  12/28/2016 1014   GFRNONAA >60 07/10/2008 0855   GFRAA  07/10/2008 0855    >60        The eGFR has been calculated using the MDRD equation. This calculation has not been validated in all clinical situations. eGFR's persistently <60 mL/min signify possible Chronic Kidney Disease.    BNP No results found for: "BNP"   Imaging:  DG Chest 2 View  Result Date: 12/22/2021 CLINICAL DATA:  Productive cough EXAM: CHEST - 2 VIEW COMPARISON:  Chest x-ray dated June 22, 2021 FINDINGS: Cardiac and mediastinal contours are within normal limits. Prior median sternotomy and CABG. Mild bibasilar opacities, similar to prior exam and likely due to atelectasis. No pleural effusion or pneumothorax. IMPRESSION: No active cardiopulmonary disease. Electronically Signed   By: Yetta Glassman M.D.   On: 12/22/2021 14:31  Latest Ref Rng & Units 06/27/2016    3:09 PM 03/16/2016    9:48 AM 11/28/2015    2:42 PM  PFT Results  FVC-Pre L 3.03  2.74  3.23   FVC-Predicted Pre % 87  78  92   FVC-Post L 3.47  3.27    FVC-Predicted Post % 99  93    Pre FEV1/FVC % % 53  54  45   Post FEV1/FCV % % 54  53    FEV1-Pre L 1.61  1.49  1.47   FEV1-Predicted Pre % 65  60  58   FEV1-Post L 1.86  1.74    DLCO uncorrected ml/min/mmHg   12.22   DLCO [facility]C% %   45   DLVA Predicted %   53   TLC L   8.31   TLC % Predicted %   133   RV % Predicted %   203     No results found for: "NITRICOXIDE"      Assessment & Plan:   Acute exacerbation of COPD with asthma ([place]) AECOPD with increased productive cough and wheezing.  He has mild bronchospasm on exam today.  CXR to rule out superimposed infection.  We will treat him with Z-Pak and prednisone course. Encouraged him to use mucinex for chest congestion. Continue ICS/LABA therapy and PRN albuterol. May need to add on LAMA therapy moving forward. Close follow up.  Patient Instructions  Continue Symbicort 2 puffs Twice daily. Brush tongue and rinse mouth  afterwards Continue Albuterol inhaler 2 puffs every 6 hours as needed for shortness of breath or wheezing. Notify if symptoms persist despite rescue inhaler/neb use.   Flonase nasal spray 2 sprays each nostril daily Z pack - take 2 tabs on day one then 1 tab daily for four additional days Prednisone taper. 4 tabs for 2 days, then 3 tabs for 2 days, 2 tabs for 2 days, then 1 tab for 2 days, then stop. Take in AM with food Mucinex 600 mg Twice daily until chest congestion/cough improves Over the counter antihistamine such as zyrtec, xyzal, claritin or allegra  Chest x ray   Follow up in 2 weeks with Dr. [name] or Alanson Aly. If symptoms do not improve or worsen, please contact office for sooner follow up or seek emergency care.     Allergic rhinitis Seems to flare in spring and fall. Advised that he start intranasal steroid. Recommended he consider restarting OTC antihistamine. Postnasal drip likely contributing to his cough. See above plan.   I spent 35 minutes of dedicated to the care of this patient on the date of this encounter to include pre-visit review of records, face-to-face time with the patient discussing conditions above, post visit ordering of testing, clinical documentation with the electronic health record, making appropriate referrals as documented, and communicating necessary findings to members of the patients care team.  Clayton Bibles, NP 12/22/2021  Pt aware and understands NP's role.

## 2021-12-22 NOTE — Assessment & Plan Note (Addendum)
AECOPD with increased productive cough and wheezing.  He has mild bronchospasm on exam today.  CXR to rule out superimposed infection.  We will treat him with Z-Pak and prednisone course. Encouraged him to use mucinex for chest congestion. Continue ICS/LABA therapy and PRN albuterol. May need to add on LAMA therapy moving forward. Close follow up.  Patient Instructions  Continue Symbicort 2 puffs Twice daily. Brush tongue and rinse mouth afterwards Continue Albuterol inhaler 2 puffs every 6 hours as needed for shortness of breath or wheezing. Notify if symptoms persist despite rescue inhaler/neb use.   Flonase nasal spray 2 sprays each nostril daily Z pack - take 2 tabs on day one then 1 tab daily for four additional days Prednisone taper. 4 tabs for 2 days, then 3 tabs for 2 days, 2 tabs for 2 days, then 1 tab for 2 days, then stop. Take in AM with food Mucinex 600 mg Twice daily until chest congestion/cough improves Over the counter antihistamine such as zyrtec, xyzal, claritin or allegra  Chest x ray   Follow up in 2 weeks with Dr. [name] or Alanson Aly. If symptoms do not improve or worsen, please contact office for sooner follow up or seek emergency care.

## 2021-12-22 NOTE — Assessment & Plan Note (Addendum)
Seems to flare in spring and fall. Advised that he start intranasal steroid. Recommended he consider restarting OTC antihistamine. Postnasal drip likely contributing to his cough. See above plan.

## 2021-12-25 ENCOUNTER — Other Ambulatory Visit: Payer: Self-pay | Admitting: Internal Medicine

## 2021-12-25 ENCOUNTER — Encounter: Payer: Self-pay | Admitting: Nurse Practitioner

## 2021-12-25 DIAGNOSIS — J4489 Other specified chronic obstructive pulmonary disease: Secondary | ICD-10-CM

## 2021-12-27 DIAGNOSIS — M25472 Effusion, left ankle: Secondary | ICD-10-CM | POA: Diagnosis not present

## 2021-12-27 DIAGNOSIS — M25572 Pain in left ankle and joints of left foot: Secondary | ICD-10-CM | POA: Diagnosis not present

## 2021-12-27 DIAGNOSIS — M25672 Stiffness of left ankle, not elsewhere classified: Secondary | ICD-10-CM | POA: Diagnosis not present

## 2021-12-27 DIAGNOSIS — R260 Ataxic gait: Secondary | ICD-10-CM | POA: Diagnosis not present

## 2022-01-02 ENCOUNTER — Other Ambulatory Visit: Payer: Self-pay | Admitting: Cardiology

## 2022-01-03 DIAGNOSIS — M25572 Pain in left ankle and joints of left foot: Secondary | ICD-10-CM | POA: Diagnosis not present

## 2022-01-03 DIAGNOSIS — M25472 Effusion, left ankle: Secondary | ICD-10-CM | POA: Diagnosis not present

## 2022-01-03 DIAGNOSIS — M25672 Stiffness of left ankle, not elsewhere classified: Secondary | ICD-10-CM | POA: Diagnosis not present

## 2022-01-03 DIAGNOSIS — R260 Ataxic gait: Secondary | ICD-10-CM | POA: Diagnosis not present

## 2022-01-10 DIAGNOSIS — R260 Ataxic gait: Secondary | ICD-10-CM | POA: Diagnosis not present

## 2022-01-10 DIAGNOSIS — M25672 Stiffness of left ankle, not elsewhere classified: Secondary | ICD-10-CM | POA: Diagnosis not present

## 2022-01-10 DIAGNOSIS — M25572 Pain in left ankle and joints of left foot: Secondary | ICD-10-CM | POA: Diagnosis not present

## 2022-01-10 DIAGNOSIS — M25472 Effusion, left ankle: Secondary | ICD-10-CM | POA: Diagnosis not present

## 2022-01-12 ENCOUNTER — Ambulatory Visit: Payer: Medicare Other | Admitting: Pulmonary Disease

## 2022-01-16 ENCOUNTER — Other Ambulatory Visit: Payer: Self-pay | Admitting: Cardiology

## 2022-01-17 ENCOUNTER — Other Ambulatory Visit: Payer: Self-pay

## 2022-01-17 ENCOUNTER — Other Ambulatory Visit: Payer: Self-pay | Admitting: Cardiology

## 2022-01-17 DIAGNOSIS — M25472 Effusion, left ankle: Secondary | ICD-10-CM | POA: Diagnosis not present

## 2022-01-17 DIAGNOSIS — R260 Ataxic gait: Secondary | ICD-10-CM | POA: Diagnosis not present

## 2022-01-17 DIAGNOSIS — M25672 Stiffness of left ankle, not elsewhere classified: Secondary | ICD-10-CM | POA: Diagnosis not present

## 2022-01-17 DIAGNOSIS — M25572 Pain in left ankle and joints of left foot: Secondary | ICD-10-CM | POA: Diagnosis not present

## 2022-01-17 MED ORDER — DILTIAZEM HCL ER COATED BEADS 180 MG PO CP24: 180.0000 mg | ORAL_CAPSULE | Freq: Every day | ORAL | 0 refills | Status: DC

## 2022-01-17 NOTE — Telephone Encounter (Signed)
Pt's medication was sent to pt's pharmacy as requested. Confirmation received.  °

## 2022-01-17 NOTE — Telephone Encounter (Signed)
Rx refill sent to pharmacy. 

## 2022-01-18 DIAGNOSIS — L84 Corns and callosities: Secondary | ICD-10-CM | POA: Diagnosis not present

## 2022-01-18 DIAGNOSIS — E1142 Type 2 diabetes mellitus with diabetic polyneuropathy: Secondary | ICD-10-CM | POA: Diagnosis not present

## 2022-01-18 DIAGNOSIS — B351 Tinea unguium: Secondary | ICD-10-CM | POA: Diagnosis not present

## 2022-01-19 ENCOUNTER — Encounter: Payer: Self-pay | Admitting: Nurse Practitioner

## 2022-01-19 ENCOUNTER — Ambulatory Visit: Payer: Medicare Other | Admitting: Nurse Practitioner

## 2022-01-19 VITALS — BP 110/70 | HR 72 | Ht 66.0 in | Wt 141.5 lb

## 2022-01-19 DIAGNOSIS — J4489 Other specified chronic obstructive pulmonary disease: Secondary | ICD-10-CM

## 2022-01-19 DIAGNOSIS — J302 Other seasonal allergic rhinitis: Secondary | ICD-10-CM | POA: Diagnosis not present

## 2022-01-19 NOTE — Assessment & Plan Note (Signed)
Improved. Tends to flare during allergy seasons. See above plan.

## 2022-01-19 NOTE — Assessment & Plan Note (Signed)
Resolved AECOPD/asthma. He is clinically improved today. Continue maintenance therapies and PRN SABA. Suspect a component of his cough is related to upper airway irritation from postnasal drainage. Encouraged him to continue intranasal steroid and OTC antihistamine.   Patient Instructions  Continue Symbicort 2 puffs Twice daily. Brush tongue and rinse mouth afterwards Continue Albuterol inhaler 2 puffs every 6 hours as needed for shortness of breath or wheezing. Notify if symptoms persist despite rescue inhaler/neb use.  Continue Flonase nasal spray 2 sprays each nostril daily Continue Over the counter antihistamine such as zyrtec, xyzal, claritin or allegra   Follow up in 3-4 months with Dr. [name]. If symptoms do not improve or worsen, please contact office for sooner follow up or seek emergency care.

## 2022-01-19 NOTE — Progress Notes (Signed)
_0  ID: [name], male    DOB: [birth year], 81 y.o.   MRN: [id number]  Chief Complaint  Patient presents with   Follow-up    PT f/u he reports he's feeling better, inhalers are helping. Reports he has had some coughing but today has been better    Referring provider: [name], MD  HPI: 81 year old male, former smoker followed for asthma/COPD overlap.  He is a patient Dr. [name] and last seen in office 06/22/2021.  Past medical history significant for CAD status post CABG x4, hypertension, ischemic cardiomyopathy, DM 2, HLD, environmental allergies.  TEST/EVENTS:  11/28/2015 PFT: FVC 92, FEV1 58, ratio 45, TLC 133, DLCO 45 06/27/2016 PFT: FVC 87, FEV1 65, ratio 54+BD  05/20/2018 CT chest: There is a nodular area of consolidation in the peribronchovascular area and hazy airspace opacities in the right lower lobe, may reflect multifocal bronchopneumonia for cryptogenic organizing pneumonia.  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 08/22/2018 CT chest without contrast: Previously seen density in the right upper lobe has improved, residual compatible with scarring.  06/22/2021: OV with Dr. [name].  Seems to be having a mild exacerbation related to pollen exposure.  Cough and congestion is improving.  He has decreased his breath sounds on the left base; CXR ordered for further evaluation which was stable.  Advised that he continue Symbicort and use Mucinex as needed.  Instructed to contact if symptoms worsen for course of prednisone.  Follow-up 6 months.  12/22/2021: OV with [name] for intended follow-up however he is also having some acute symptoms.  He first developed a increased productive cough around 2 months ago.  He describes the sputum as gray.  Feels like his cough waxes and wanes.  Could be related to allergies per his report. He doesn't have any significant congestion or drainage; occasionally feels like he has postnasal drip. He has had some recent increase in wheezing and feels slightly  more short winded than normal.  Feels like activities around the house have been a little more difficult for him over the past few weeks to a month.  He denies any fevers, chills, hemoptysis, lower extremity swelling, orthopnea.  No known sick exposures.  Did not feel like he had any URI symptoms that onset his cough.  He is using Symbicort daily.  CXR with mild bibasilar atelectasis. Treated for AECOPD/asthma with azithromycin and prednisone.   01/19/2022: Today - follow up Patient presents today for follow up after being treated for AECOPD/asthma. He completed prednisone and antibiotics.  Feels much better today.  Symptoms feel like they are back to his baseline.  Does still have an occasional cough but overall, much improved. Minimal production; occasional clear sputum. Doesn't feel like he's had much wheezing. No fevers, chills, hemoptysis, orthopnea, leg swelling. He is using symbicort twice daily, takes OTC antihistamine and flonase.   Allergies  Allergen Reactions   Lisinopril Other (See Comments)    Immunization History  Administered Date(s) Administered   Influenza, High Dose Seasonal PF 11/28/2015, 01/03/2016, 12/17/2016, 11/30/2017, 12/04/2019   Influenza, Quadrivalent, Recombinant, Inj, Pf 12/06/2017, 12/19/2018, 12/16/2020   Influenza,inj,quad, With Preservative 12/17/2017   Influenza-Unspecified 01/11/2011, 01/11/2012   Moderna Covid-19 Vaccine Bivalent Booster 76yr & up 12/20/2020   Moderna SARS-COV2 Booster Vaccination 01/12/2020   Pneumococcal Conjugate-13 07/24/2011, 06/23/2015, 03/22/2016, 03/08/2017   Pneumococcal Polysaccharide-23 07/07/2009   Td 07/24/2011   Zoster Recombinat (Shingrix) 12/07/2016, 02/16/2017, 03/08/2017    Past Medical History:  Diagnosis Date   Abnormal finding on lung  imaging 05/14/2018   04/09/2018-chest x-ray-ill-defined peripheral right midlung airspace opacity possibly early pneumonia,  >>>follow-up PA lateral chest x-ray is recommended in 3 to 4  weeks following trial of antibiotic therapy to ensure resolution and exclude underlying malignancy  05/14/2018- chest x-ray-persistent ill-defined right lung interstitial and airspace process possible persistent bronchopneumonia, chest C   Asthma    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   02/03/2016-respiratory allergy panel, environmental allergens to dust mites, cat dander, dog dander, Timothy grass, cockroach, fungus, cedar trees, ragweed, rough pigweed, mouse urine, IgE is 689 >>>Largest elevations are to cat dander, dog dander, dust mites  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)  06/27/2016-pulmonary function test-FVC 3.03 (87% predicted), postbronchodilator ratio 54, postbronc   Basal cell carcinoma 08/20/2016   left cheek-cx3 excision   BCC (basal cell carcinoma) 09/20/2016   left cheek-mohs   CAD (coronary artery disease)    cath & CABG in 2008   Cardiomyopathy, 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   COPD (chronic obstructive pulmonary disease) ([name])    COPD with acute exacerbation ([place]) 04/02/2018   Diabetes ([place])    type 2   DM2 (diabetes mellitus, type 2) ([name]) 02/05/2013   Dyslipidemia    Environmental allergies 02/03/2016   02/03/2016-respiratory allergy panel, environmental allergens to dust mites, cat dander, dog dander, Timothy grass, cockroach, fungus, cedar trees, ragweed, rough pigweed, mouse urine, IgE is 689 >>>Largest elevations are to cat dander, dog dander, dust mites    Essential hypertension 02/03/2016   Former smoker 05/14/2018   Former smoker Quit 2008 43-pack-year smoking history   History of nuclear stress test 10/08/2007   bruce myoview; normal scan with attenuation artifact; no ischemia/infarct; low risk    Hypertriglyceridemia 02/03/2016   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 [name] - LIMA to LAD, sequential saphenous vein graft to OM1 and  OM 2, SVG to posterior descending (done emergently)     Tobacco History: Social History   Tobacco Use  Smoking Status Former   Packs/day: 1.00   Years: 43.00   Total pack years: 43.00   Types: Cigarettes   Start date: 12/25/1963   Quit date: 01/31/2007   Years since quitting: 14.9  Smokeless Tobacco Never   Counseling given: Not Answered   Outpatient Medications Prior to Visit  Medication Sig Dispense Refill   albuterol (VENTOLIN HFA) 108 (90 Base) MCG/ACT inhaler Inhale 2 puffs into the lungs every 6 (six) hours as needed for wheezing or shortness of breath. 8 g 2   azithromycin (ZITHROMAX) 250 MG tablet Take 2 tabs on day one followed by 1 tab daily for four additional days 6 tablet 0   clobetasol cream (TEMOVATE) 4.12 % Apply 1 application topically 2 (two) times daily. Apply to rash 1-2 times daily 60 g 1   COVID-19 mRNA bivalent vaccine, Moderna, (MODERNA COVID-19 BIVAL BOOSTER) 50 MCG/0.5ML injection Inject into the muscle. 0.5 mL 0   Cyanocobalamin (VITAMIN B-12 SL) Place 1 tablet under the tongue daily.     dapagliflozin propanediol (FARXIGA) 5 MG TABS tablet Take by mouth daily.     diltiazem (CARDIZEM CD) 180 MG 24 hr capsule TAKE 1 CAPSULE(180 MG) BY MOUTH DAILY 90 capsule 0   fluticasone (FLONASE) 50 MCG/ACT nasal spray Place 1 spray into both nostrils daily. 87.8 mL 2   folic acid (FOLVITE) 1 MG tablet  Take 1 mg by mouth daily.     losartan (COZAAR) 25 MG tablet Take 25 mg by mouth daily. as directed  1   metFORMIN (GLUCOPHAGE) 500 MG tablet Take 500 mg by mouth 2 (two) times daily with a meal.      nitroGLYCERIN (NITROSTAT) 0.4 MG SL tablet Place 0.4 mg under the tongue every 5 (five) minutes x 3 doses as needed.     Omega-3 Fatty Acids (FISH OIL PO) Take 1 capsule by mouth at bedtime.     pravastatin (PRAVACHOL) 40 MG tablet Take 1 tablet (40 mg total) by mouth daily. 90 tablet 3   predniSONE (DELTASONE) 10 MG tablet 4 tabs for 2 days, then 3 tabs for 2 days, 2 tabs  for 2 days, then 1 tab for 2 days, then stop 20 tablet 0   SYMBICORT 80-4.5 MCG/ACT inhaler INHALE 2 PUFFS INTO THE LUNGS IN THE MORNING AND AT BEDTIME 10.2 g 3   No facility-administered medications prior to visit.     Review of Systems:   Constitutional: No weight loss or gain, night sweats, fevers, chills, fatigue, or lassitude. HEENT: No headaches, difficulty swallowing, tooth/dental problems, or sore throat. No sneezing, itching, ear ache. + nasal congestion, post nasal drip (improved) CV:  No chest pain, orthopnea, PND, swelling in lower extremities, anasarca, dizziness, palpitations, syncope Resp: +shortness of breath with exertion (baseline); improved cough, minimally productive cough (improved). No hemoptysis. No wheezing.  No chest wall deformity GI:  No heartburn, indigestion, abdominal pain, nausea, vomiting, diarrhea, change in bowel habits, loss of appetite, bloody stools.  Neuro: No dizziness or lightheadedness.  Psych: No depression or anxiety. Mood stable.     Physical Exam:  BP 110/70   Pulse 72   Ht _0  (1.676 m)   Wt 141 lb 8 oz (64.2 kg)   SpO2 97%   BMI 22.84 kg/m   GEN: Pleasant, interactive, well-appearing; elderly; in no acute distress. HEENT:  Normocephalic and atraumatic. PERRLA. Sclera white. Nasal turbinates pink, moist and patent bilaterally. No rhinorrhea present. Oropharynx pink and moist, without exudate or edema. No lesions, ulcerations, or postnasal drip.  NECK:  Supple w/ fair ROM. No JVD present. No lymphadenopathy.   CV: RRR, no m/r/g, no peripheral edema. Pulses intact, +2 bilaterally. No cyanosis, pallor or clubbing. PULMONARY:  Unlabored, regular breathing. Diminished bilaterally w/o wheezes/rales/rhonchi A&P. No accessory muscle use.  GI: BS present and normoactive. Soft, non-tender to palpation. No organomegaly or masses detected.  MSK: No erythema, warmth or tenderness. Cap refil <2 sec all extrem. No deformities or joint swelling  noted.  Neuro: A/Ox3. No focal deficits noted.   Skin: Warm, no lesions or rashe Psych: Normal affect and behavior. Judgement and thought content appropriate.     Lab Results:  CBC    Component Value Date/Time   WBC 8.0 07/10/2008 0855   RBC 4.42 07/10/2008 0855   HGB 14.6 09/16/2015 1749   HCT 43.0 09/16/2015 1749   PLT 299 07/10/2008 0855   MCV 89.0 07/10/2008 0855   MCHC 34.2 07/10/2008 0855   RDW 12.7 07/10/2008 0855   LYMPHSABS 2.0 07/04/2008 0358   MONOABS 0.7 07/04/2008 0358   EOSABS 0.3 07/04/2008 0358   BASOSABS 0.0 07/04/2008 0358    BMET    Component Value Date/Time   NA 140 12/28/2016 1014   K 4.6 12/28/2016 1014   CL 105 12/28/2016 1014   CO2 28 12/28/2016 1014   GLUCOSE 127 (H) 12/28/2016 1014  BUN 16 12/28/2016 1014   CREATININE 0.75 12/28/2016 1014   CALCIUM 8.9 12/28/2016 1014   GFRNONAA >60 07/10/2008 0855   GFRAA  07/10/2008 0855    >60        The eGFR has been calculated using the MDRD equation. This calculation has not been validated in all clinical situations. eGFR's persistently <60 mL/min signify possible Chronic Kidney Disease.    BNP No results found for: "BNP"   Imaging:  DG Chest 2 View  Result Date: 12/22/2021 CLINICAL DATA:  Productive cough EXAM: CHEST - 2 VIEW COMPARISON:  Chest x-ray dated June 22, 2021 FINDINGS: Cardiac and mediastinal contours are within normal limits. Prior median sternotomy and CABG. Mild bibasilar opacities, similar to prior exam and likely due to atelectasis. No pleural effusion or pneumothorax. IMPRESSION: No active cardiopulmonary disease. Electronically Signed   By: Yetta Glassman M.D.   On: 12/22/2021 14:31         Latest Ref Rng & Units 06/27/2016    3:09 PM 03/16/2016    9:48 AM 11/28/2015    2:42 PM  PFT Results  FVC-Pre L 3.03  2.74  3.23   FVC-Predicted Pre % 87  78  92   FVC-Post L 3.47  3.27    FVC-Predicted Post % 99  93    Pre FEV1/FVC % % 53  54  45   Post FEV1/FCV % % 54   53    FEV1-Pre L 1.61  1.49  1.47   FEV1-Predicted Pre % 65  60  58   FEV1-Post L 1.86  1.74    DLCO uncorrected ml/min/mmHg   12.22   DLCO [facility]% %   45   DLVA Predicted %   53   TLC L   8.31   TLC % Predicted %   133   RV % Predicted %   203     No results found for: "NITRICOXIDE"      Assessment & Plan:   Asthma-COPD overlap syndrome (HCC) Resolved AECOPD/asthma. He is clinically improved today. Continue maintenance therapies and PRN SABA. Suspect a component of his cough is related to upper airway irritation from postnasal drainage. Encouraged him to continue intranasal steroid and OTC antihistamine.   Patient Instructions  Continue Symbicort 2 puffs Twice daily. Brush tongue and rinse mouth afterwards Continue Albuterol inhaler 2 puffs every 6 hours as needed for shortness of breath or wheezing. Notify if symptoms persist despite rescue inhaler/neb use.  Continue Flonase nasal spray 2 sprays each nostril daily Continue Over the counter antihistamine such as zyrtec, xyzal, claritin or allegra   Follow up in 3-4 months with Dr. [name]. If symptoms do not improve or worsen, please contact office for sooner follow up or seek emergency care.   Allergic rhinitis Improved. Tends to flare during allergy seasons. See above plan.   I spent 25 minutes of dedicated to the care of this patient on the date of this encounter to include pre-visit review of records, face-to-face time with the patient discussing conditions above, post visit ordering of testing, clinical documentation with the electronic health record, making appropriate referrals as documented, and communicating necessary findings to members of the patients care team.  Clayton Bibles, NP 01/19/2022  Pt aware and understands NP's role.

## 2022-01-19 NOTE — Patient Instructions (Signed)
Continue Symbicort 2 puffs Twice daily. Brush tongue and rinse mouth afterwards Continue Albuterol inhaler 2 puffs every 6 hours as needed for shortness of breath or wheezing. Notify if symptoms persist despite rescue inhaler/neb use.  Continue Flonase nasal spray 2 sprays each nostril daily Continue Over the counter antihistamine such as zyrtec, xyzal, claritin or allegra   Follow up in 3-4 months with Dr. [name]. If symptoms do not improve or worsen, please contact office for sooner follow up or seek emergency care.

## 2022-01-24 DIAGNOSIS — M25572 Pain in left ankle and joints of left foot: Secondary | ICD-10-CM | POA: Diagnosis not present

## 2022-01-24 DIAGNOSIS — R260 Ataxic gait: Secondary | ICD-10-CM | POA: Diagnosis not present

## 2022-01-24 DIAGNOSIS — M25672 Stiffness of left ankle, not elsewhere classified: Secondary | ICD-10-CM | POA: Diagnosis not present

## 2022-01-24 DIAGNOSIS — M25472 Effusion, left ankle: Secondary | ICD-10-CM | POA: Diagnosis not present

## 2022-01-29 DIAGNOSIS — T148XXA Other injury of unspecified body region, initial encounter: Secondary | ICD-10-CM | POA: Diagnosis not present

## 2022-01-31 DIAGNOSIS — M25572 Pain in left ankle and joints of left foot: Secondary | ICD-10-CM | POA: Diagnosis not present

## 2022-01-31 DIAGNOSIS — R260 Ataxic gait: Secondary | ICD-10-CM | POA: Diagnosis not present

## 2022-01-31 DIAGNOSIS — M25672 Stiffness of left ankle, not elsewhere classified: Secondary | ICD-10-CM | POA: Diagnosis not present

## 2022-01-31 DIAGNOSIS — M25472 Effusion, left ankle: Secondary | ICD-10-CM | POA: Diagnosis not present

## 2022-02-06 ENCOUNTER — Encounter: Payer: Self-pay | Admitting: Cardiology

## 2022-02-06 ENCOUNTER — Ambulatory Visit: Payer: Medicare Other | Attending: Cardiology | Admitting: Cardiology

## 2022-02-06 ENCOUNTER — Ambulatory Visit
Admission: RE | Admit: 2022-02-06 | Discharge: 2022-02-06 | Disposition: A | Discharge status: Home / Self Care | Payer: Medicare Other | Source: Ambulatory Visit | Attending: Cardiology | Admitting: Cardiology

## 2022-02-06 ENCOUNTER — Ambulatory Visit: Payer: Medicare Other | Admitting: Cardiology

## 2022-02-06 VITALS — BP 120/70 | HR 63 | Ht 66.0 in | Wt 144.0 lb

## 2022-02-06 DIAGNOSIS — W19XXXA Unspecified fall, initial encounter: Secondary | ICD-10-CM

## 2022-02-06 DIAGNOSIS — I480 Paroxysmal atrial fibrillation: Secondary | ICD-10-CM

## 2022-02-06 DIAGNOSIS — I2510 Atherosclerotic heart disease of native coronary artery without angina pectoris: Secondary | ICD-10-CM

## 2022-02-06 DIAGNOSIS — S0990XA Unspecified injury of head, initial encounter: Secondary | ICD-10-CM | POA: Diagnosis not present

## 2022-02-06 DIAGNOSIS — D6869 Other thrombophilia: Secondary | ICD-10-CM

## 2022-02-06 NOTE — Progress Notes (Signed)
Electrophysiology Office Note   Date:  02/06/2022   ID:  [name], DOB 12/22/1940, MRN [id number]  PCP:  [name], MD  Cardiologist:  Debara Pickett Primary Electrophysiologist:  Drury Ardizzone Meredith Leeds, MD    Chief Complaint: palpitations   History of Present Illness: [name] is a 81 y.o. male who is being seen today for the evaluation of SVT, VT at the request of [name], MD. Presenting today for electrophysiology evaluation.  The history significant for coronary artery disease post CABG in 2008x4 with postoperative atrial fibrillation, COPD, hyperlipidemia, asthma.  He wore a cardiac monitor that was negative for atrial fibrillation though did show PACs, PVCs, nonsustained VT, SVT.  Today, denies symptoms of palpitations, chest pain, shortness of breath, orthopnea, PND, lower extremity edema, claudication, dizziness, presyncope, syncope, bleeding, or neurologic sequela. The patient is tolerating medications without difficulties.  He feels well.  He has no chest pain or shortness of breath.  He is able to all of his daily activities.  He does have mild dyspnea on exertion when climbing up a flight of stairs and holding something heavy, but otherwise has been doing well.  Recently have a mechanical fall.  He says that he hit his head on a concrete area.  This was 5 days ago.  He does not have any headaches, slurred speech, new numbness, tingling, weakness.   Past Medical History:  Diagnosis Date   Abnormal finding on lung imaging 05/14/2018   04/09/2018-chest x-ray-ill-defined peripheral right midlung airspace opacity possibly early pneumonia,  >>>follow-up PA lateral chest x-ray is recommended in 3 to 4 weeks following trial of antibiotic therapy to ensure resolution and exclude underlying malignancy  05/14/2018- chest x-ray-persistent ill-defined right lung interstitial and airspace process possible persistent bronchopneumonia, chest C   Asthma    Asthma-COPD overlap syndrome  02/03/2016   02/03/2016-respiratory allergy panel, environmental allergens to dust mites, cat dander, dog dander, Timothy grass, cockroach, fungus, cedar trees, ragweed, rough pigweed, mouse urine, IgE is 689 >>>Largest elevations are to cat dander, dog dander, dust mites  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)  06/27/2016-pulmonary function test-FVC 3.03 (87% predicted), postbronchodilator ratio 54, postbronc   Basal cell carcinoma 08/20/2016   left cheek-cx3 excision   BCC (basal cell carcinoma) 09/20/2016   left cheek-mohs   CAD (coronary artery disease)    cath & CABG in 2008   Cardiomyopathy, 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   COPD (chronic obstructive pulmonary disease) ([place])    COPD with acute exacerbation ([place]) 04/02/2018   Diabetes ([place])    type 2   DM2 (diabetes mellitus, type 2) ([place]) 02/05/2013   Dyslipidemia    Environmental allergies 02/03/2016   02/03/2016-respiratory allergy panel, environmental allergens to dust mites, cat dander, dog dander, Timothy grass, cockroach, fungus, cedar trees, ragweed, rough pigweed, mouse urine, IgE is 689 >>>Largest elevations are to cat dander, dog dander, dust mites    Essential hypertension 02/03/2016   Former smoker 05/14/2018   Former smoker Quit 2008 43-pack-year smoking history   History of nuclear stress test 10/08/2007   bruce myoview; normal scan with attenuation artifact; no ischemia/infarct; low risk    Hypertriglyceridemia 02/03/2016   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 [name] - LIMA to LAD, sequential saphenous vein graft to OM1 and OM 2, SVG to posterior descending (done emergently)  Past Surgical History:  Procedure Laterality Date   CARDIAC CATHETERIZATION  2003   total occlusion of LAD with left to left collaterals, mod disease of RCA & mild disease in Cfx (Dr. Loni Muse. Little)   Carotid Doppler   06/2010   stenosis of ICA less than 50%    CORONARY ARTERY BYPASS GRAFT  05/31/2006   LIMA to LAD, SVG to OM1 & OM2, SVG to PDA (Dr. Prescott Gum)   NASAL SEPTOPLASTY W/ TURBINOPLASTY  1970s   PROSTATE BIOPSY     TONSILLECTOMY       Current Outpatient Medications  Medication Sig Dispense Refill   albuterol (VENTOLIN HFA) 108 (90 Base) MCG/ACT inhaler Inhale 2 puffs into the lungs every 6 (six) hours as needed for wheezing or shortness of breath. 8 g 2   aspirin 81 MG chewable tablet Chew 81 mg by mouth daily.     COVID-19 mRNA bivalent vaccine, Moderna, (MODERNA COVID-19 BIVAL BOOSTER) 50 MCG/0.5ML injection Inject into the muscle. 0.5 mL 0   Cyanocobalamin (VITAMIN B-12 SL) Place 1 tablet under the tongue daily.     dapagliflozin propanediol (FARXIGA) 5 MG TABS tablet Take by mouth daily.     diltiazem (CARDIZEM CD) 180 MG 24 hr capsule TAKE 1 CAPSULE(180 MG) BY MOUTH DAILY 90 capsule 0   fluticasone (FLONASE) 50 MCG/ACT nasal spray Place 1 spray into both nostrils daily. 44.3 mL 2   folic acid (FOLVITE) 1 MG tablet Take 1 mg by mouth daily.     losartan (COZAAR) 25 MG tablet Take 25 mg by mouth daily. as directed  1   nitroGLYCERIN (NITROSTAT) 0.4 MG SL tablet Place 0.4 mg under the tongue every 5 (five) minutes x 3 doses as needed.     Omega-3 Fatty Acids (FISH OIL PO) Take 1 capsule by mouth at bedtime.     pravastatin (PRAVACHOL) 40 MG tablet Take 1 tablet (40 mg total) by mouth daily. 90 tablet 3   SYMBICORT 80-4.5 MCG/ACT inhaler INHALE 2 PUFFS INTO THE LUNGS IN THE MORNING AND AT BEDTIME 10.2 g 3   No current facility-administered medications for this visit.    Allergies:   Lisinopril   Social History:  The patient  reports that he quit smoking about 15 years ago. His smoking use included cigarettes. He started smoking about 58 years ago. He has a 43.00 pack-year smoking history. He has never used smokeless tobacco. He reports current alcohol use. He reports that he does not use  drugs.   Family History:  The patient's family history includes Alzheimer's disease in his father; CAD in his father; Diabetes in his mother; Heart attack (age of onset: 49) in his father; Stroke in his maternal grandmother.   ROS:  Please see the history of present illness.   Otherwise, review of systems is positive for none.   All other systems are reviewed and negative.   PHYSICAL EXAM: VS:  BP 120/70 (BP Location: Left Arm, Patient Position: Sitting, Cuff Size: Normal)   Pulse 63   Ht '5\' 6"'$  (1.676 m)   Wt 144 lb (65.3 kg)   BMI 23.24 kg/m  , BMI Body mass index is 23.24 kg/m. GEN: Well nourished, well developed, in no acute distress  HEENT: normal  Neck: no JVD, carotid bruits, or masses Cardiac: RRR; no murmurs, rubs, or gallops,no edema  Respiratory:  clear to auscultation bilaterally, normal work of breathing GI: soft, nontender, nondistended, + BS MS: no deformity or atrophy  Skin: warm and  dry, bruising to left eye Neuro:  Strength and sensation are intact Psych: euthymic mood, full affect  EKG:  EKG is ordered today. Personal review of the ekg ordered shows sinus rhythm   Recent Labs: No results found for requested labs within last 365 days.    Lipid Panel  No results found for: "CHOL", "TRIG", "HDL", "CHOLHDL", "VLDL", "LDLCALC", "LDLDIRECT"   Wt Readings from Last 3 Encounters:  02/06/22 144 lb (65.3 kg)  01/19/22 141 lb 8 oz (64.2 kg)  12/22/21 140 lb 12.8 oz (63.9 kg)      Other studies Reviewed: Additional studies/ records that were reviewed today include: TTE 03/08/16  Review of the above records today demonstrates:  - Left ventricle: The cavity size was normal. There was mild   concentric hypertrophy. Systolic function was normal. The   estimated ejection fraction was in the range of 50% to 55%. Wall   motion was normal; there were no regional wall motion   abnormalities. Doppler parameters are consistent with abnormal   left ventricular relaxation  (grade 1 diastolic dysfunction). The   E/e&' ratio is between 8-15, suggesting indeterminate LV filling   pressure./ - Mitral valve: Mildly thickened leaflets . There was mild   regurgitation. - Left atrium: The atrium was normal in size. - Right ventricle: The cavity size was mildly dilated. Systolic   function is mildly reduced. - Right atrium: The atrium was mildly dilated. - Tricuspid valve: There was trivial regurgitation. - Pulmonary arteries: PA peak pressure: 28 mm Hg (S). - Inferior vena cava: The vessel was normal in size. The   respirophasic diameter changes were in the normal range (= 50%),   consistent with normal central venous pressure.  Monitor 11/17/18 personally reviewed Moderate burden of PVCs and PACs, intermittent SVT and short runs of NSVT noted.  SPECT 11/25/18 Nuclear stress EF: 43%. There was no ST segment deviation noted during stress. No T wave inversion was noted during stress. Findings consistent with prior myocardial infarction. This is a low risk study. The left ventricular ejection fraction is moderately decreased (30-44%).  ASSESSMENT AND PLAN:  1.  PACs/nonsustained VT: None noted on ECG today.  He is currently asymptomatic without complaints of arrhythmia.  No changes.  2.  Coronary disease: Status post CABG.  No current chest pain.  3.  Atrial fibrillation: He had episodes of atrial fibrillation but does not feel that he has had any over the last few years.  CHA2DS2-VASc of 3.  Currently on Eliquis 5 mg twice daily.  We Xena Propst have him follow-up with his primary cardiologist for atrial fibrillation.  If he has further arrhythmias, we Indira Sorenson be happy to see him back.  4.  Mechanical fall: Patient has bruising over his left eye.  He fell and hit his head.  He is on Eliquis.  We Aalyah Mansouri plan for noncontrast head CT to ensure that he is not having any intracranial hemorrhage.   Current medicines are reviewed at length with the patient today.   The patient  does not have concerns regarding his medicines.  The following changes were made today: None  Labs/ tests ordered today include:  Orders Placed This Encounter  Procedures   CT HEAD WO CONTRAST (5MM)   EKG 12-Lead     Disposition:   FU with Tayli Buch as needed months  Signed, Pascual Mantel Meredith Leeds, MD  02/06/2022 12:21 PM     [place] [address] [phone] (office) (  [phone] (fax)

## 2022-02-06 NOTE — Patient Instructions (Addendum)
Medication Instructions:  Your physician recommends that you continue on your current medications as directed. Please refer to the Current Medication list given to you today.  *If you need a refill on your cardiac medications before your next appointment, please call your pharmacy*   Lab Work: None ordered If you have labs (blood work) drawn today and your tests are completely normal, you will receive your results only by: [place] (if you have MyChart) OR A paper copy in the mail If you have any lab test that is abnormal or we need to change your treatment, we will call you to review the results.   Testing/Procedures: Non-Cardiac CT scanning, (CAT scanning), is a noninvasive, special x-ray that produces cross-sectional images of the body using x-rays and a computer. CT scans help physicians diagnose and treat medical conditions. For some CT exams, a contrast material is used to enhance visibility in the area of the body being studied. CT scans provide greater clarity and reveal more details than regular x-ray exams.    Follow-Up: At [facility], you and your health needs are our priority.  As part of our continuing mission to provide you with exceptional heart care, we have created designated Provider Care Teams.  These Care Teams include your primary Cardiologist (physician) and Advanced Practice Providers (APPs -  Physician Assistants and Nurse Practitioners) who all work together to provide you with the care you need, when you need it.  We recommend signing up for the patient portal called "MyChart".  Sign up information is provided on this After Visit Summary.  MyChart is used to connect with patients for Virtual Visits (Telemedicine).  Patients are able to view lab/test results, encounter notes, upcoming appointments, etc.  Non-urgent messages can be sent to your provider as well.   To learn more about what you can do with MyChart, go to [email or web address].    Your next  appointment:   Keep  your scheduled follow up with Dr. Debara Pickett in January 1}  Follow up as needed with Dr. Curt Bears  Thank you for choosing [facility]!!   Trinidad Curet, RN [phone]  Other Instructions    Important Information About Sugar

## 2022-02-06 NOTE — Addendum Note (Signed)
Addended by: Stanton Kidney on: 02/06/2022 12:26 PM   Modules accepted: Orders

## 2022-02-07 DIAGNOSIS — M25572 Pain in left ankle and joints of left foot: Secondary | ICD-10-CM | POA: Diagnosis not present

## 2022-02-07 DIAGNOSIS — M25472 Effusion, left ankle: Secondary | ICD-10-CM | POA: Diagnosis not present

## 2022-02-07 DIAGNOSIS — M25672 Stiffness of left ankle, not elsewhere classified: Secondary | ICD-10-CM | POA: Diagnosis not present

## 2022-02-07 DIAGNOSIS — R260 Ataxic gait: Secondary | ICD-10-CM | POA: Diagnosis not present

## 2022-02-12 DIAGNOSIS — H26493 Other secondary cataract, bilateral: Secondary | ICD-10-CM | POA: Diagnosis not present

## 2022-02-12 DIAGNOSIS — E119 Type 2 diabetes mellitus without complications: Secondary | ICD-10-CM | POA: Diagnosis not present

## 2022-02-12 DIAGNOSIS — H02032 Senile entropion of right lower eyelid: Secondary | ICD-10-CM | POA: Diagnosis not present

## 2022-02-12 DIAGNOSIS — Z961 Presence of intraocular lens: Secondary | ICD-10-CM | POA: Diagnosis not present

## 2022-02-12 DIAGNOSIS — H02831 Dermatochalasis of right upper eyelid: Secondary | ICD-10-CM | POA: Diagnosis not present

## 2022-02-12 DIAGNOSIS — H0102B Squamous blepharitis left eye, upper and lower eyelids: Secondary | ICD-10-CM | POA: Diagnosis not present

## 2022-02-12 DIAGNOSIS — H02035 Senile entropion of left lower eyelid: Secondary | ICD-10-CM | POA: Diagnosis not present

## 2022-02-12 DIAGNOSIS — H43813 Vitreous degeneration, bilateral: Secondary | ICD-10-CM | POA: Diagnosis not present

## 2022-02-12 DIAGNOSIS — H02834 Dermatochalasis of left upper eyelid: Secondary | ICD-10-CM | POA: Diagnosis not present

## 2022-02-12 DIAGNOSIS — H0102A Squamous blepharitis right eye, upper and lower eyelids: Secondary | ICD-10-CM | POA: Diagnosis not present

## 2022-02-14 DIAGNOSIS — M25672 Stiffness of left ankle, not elsewhere classified: Secondary | ICD-10-CM | POA: Diagnosis not present

## 2022-02-14 DIAGNOSIS — R260 Ataxic gait: Secondary | ICD-10-CM | POA: Diagnosis not present

## 2022-02-14 DIAGNOSIS — M25472 Effusion, left ankle: Secondary | ICD-10-CM | POA: Diagnosis not present

## 2022-02-14 DIAGNOSIS — M25572 Pain in left ankle and joints of left foot: Secondary | ICD-10-CM | POA: Diagnosis not present

## 2022-02-21 DIAGNOSIS — M25572 Pain in left ankle and joints of left foot: Secondary | ICD-10-CM | POA: Diagnosis not present

## 2022-02-21 DIAGNOSIS — M25472 Effusion, left ankle: Secondary | ICD-10-CM | POA: Diagnosis not present

## 2022-02-21 DIAGNOSIS — R260 Ataxic gait: Secondary | ICD-10-CM | POA: Diagnosis not present

## 2022-02-21 DIAGNOSIS — M25672 Stiffness of left ankle, not elsewhere classified: Secondary | ICD-10-CM | POA: Diagnosis not present

## 2022-02-28 DIAGNOSIS — M25672 Stiffness of left ankle, not elsewhere classified: Secondary | ICD-10-CM | POA: Diagnosis not present

## 2022-02-28 DIAGNOSIS — R260 Ataxic gait: Secondary | ICD-10-CM | POA: Diagnosis not present

## 2022-02-28 DIAGNOSIS — M25572 Pain in left ankle and joints of left foot: Secondary | ICD-10-CM | POA: Diagnosis not present

## 2022-02-28 DIAGNOSIS — M25472 Effusion, left ankle: Secondary | ICD-10-CM | POA: Diagnosis not present

## 2022-03-01 ENCOUNTER — Telehealth: Payer: Self-pay | Admitting: Pulmonary Disease

## 2022-03-01 NOTE — Telephone Encounter (Signed)
Called patient and wife back and they are wanting to know if there are any concerns or issues with the use of gas logs. She states the Fire place is vented and gas logs are in use and they dont close the flap. She states she is just wanting to know if there are any issues with his respiratory problems and using gas logs.   Please advise sir

## 2022-03-01 NOTE — Telephone Encounter (Signed)
Patient's spouse would like the nurse to call because she has some questions regarding gas logs and respiratory problems pertaining to the patient.  She stated she has some questions and would like a call back.  CB# [phone]

## 2022-03-01 NOTE — Telephone Encounter (Signed)
Called and left voicemail per wifes instructions detailed message from Dr Angus Palms recommendations. Nothing further needed

## 2022-03-20 DIAGNOSIS — J069 Acute upper respiratory infection, unspecified: Secondary | ICD-10-CM | POA: Diagnosis not present

## 2022-03-20 DIAGNOSIS — J439 Emphysema, unspecified: Secondary | ICD-10-CM | POA: Diagnosis not present

## 2022-04-13 ENCOUNTER — Ambulatory Visit: Payer: Medicare Other | Admitting: Internal Medicine

## 2022-04-16 DIAGNOSIS — L820 Inflamed seborrheic keratosis: Secondary | ICD-10-CM | POA: Diagnosis not present

## 2022-04-16 DIAGNOSIS — L239 Allergic contact dermatitis, unspecified cause: Secondary | ICD-10-CM | POA: Diagnosis not present

## 2022-04-16 DIAGNOSIS — L89300 Pressure ulcer of unspecified buttock, unstageable: Secondary | ICD-10-CM | POA: Diagnosis not present

## 2022-04-17 DIAGNOSIS — E1142 Type 2 diabetes mellitus with diabetic polyneuropathy: Secondary | ICD-10-CM | POA: Diagnosis not present

## 2022-04-17 DIAGNOSIS — B351 Tinea unguium: Secondary | ICD-10-CM | POA: Diagnosis not present

## 2022-04-17 DIAGNOSIS — L84 Corns and callosities: Secondary | ICD-10-CM | POA: Diagnosis not present

## 2022-04-18 ENCOUNTER — Encounter: Payer: Self-pay | Admitting: Physician Assistant

## 2022-04-18 ENCOUNTER — Ambulatory Visit: Payer: Medicare Other | Attending: Physician Assistant | Admitting: Physician Assistant

## 2022-04-18 VITALS — BP 108/60 | HR 62 | Ht 66.0 in | Wt 143.2 lb

## 2022-04-18 DIAGNOSIS — E785 Hyperlipidemia, unspecified: Secondary | ICD-10-CM | POA: Diagnosis not present

## 2022-04-18 DIAGNOSIS — E119 Type 2 diabetes mellitus without complications: Secondary | ICD-10-CM

## 2022-04-18 DIAGNOSIS — I480 Paroxysmal atrial fibrillation: Secondary | ICD-10-CM | POA: Diagnosis not present

## 2022-04-18 DIAGNOSIS — I10 Essential (primary) hypertension: Secondary | ICD-10-CM | POA: Diagnosis not present

## 2022-04-18 DIAGNOSIS — I25810 Atherosclerosis of coronary artery bypass graft(s) without angina pectoris: Secondary | ICD-10-CM | POA: Diagnosis not present

## 2022-04-18 NOTE — Progress Notes (Unsigned)
Cardiology Office Note:    Date:  04/19/2022   ID:  [name], DOB 04-16-1940, MRN [id number]  PCP:  [name], [place] Providers Cardiologist:  Pixie Casino, MD Electrophysiologist:  Will Meredith Leeds, MD     Referring MD: [name], MD   Chief Complaint  Patient presents with   Follow-up    Seen for Dr. Debara Pickett    History of Present Illness:    [name] is a 82 y.o. male with a hx of CAD s/p emergent CABG 2008, PAF, SVT, HTN, HLD, DM II and COPD.  He is being followed by both Dr. Debara Pickett and Dr. Curt Bears.  He was doing well when he last saw Dr. Debara Pickett in December 2020.  He had significant bruising on anticoagulation therapy, after discussion between the patient and Dr. Curt Bears, ultimately it was decided to switch him to aspirin.  He was felt to be a potential candidate for Watchman device or loop recorder, although patient preferred conservative management.  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  He was seen by EP service at the time for evaluation possible scar mediated VT.  It was recommended for improved A-fib control prior to assessing SVT.  He was last seen by Roby Lofts, PA-C in August 2022 at which time he was doing well.  He does notice occasional shortness of breath when carrying things upstairs but otherwise no issue doing normal activity.  Watchman device was discussed with the patient again at that time, however he wished to continue conservative management.  He was last seen by Dr. Curt Bears in November 2023 at which time he was doing well, he did complain of single episode of fall where he hit his head.  Subsequent CT of the head showed no intracranial etiology, chronic right maxillary sinusitis.  Patient presents today for follow-up.  He denies any recent chest pain worsening dyspnea.  He has no significant lower extremity edema, orthopnea or PND.  He does  get dyspneic with more strenuous activity but not with every day activity.  He is being followed by Dr. [name] of pulmonology service.  His last visit with Dr. [name] was here in April 2023.  His blood pressure is very well-controlled.  He is aware to monitor for any sign of stroke.  Since the last visit, he has not had any fall.  He has not had any recurrent A-fib either.  I did not get a EKG today, however his heart rate was quite regular on physical exam suggesting sinus rhythm.  Overall he is doing well and can follow-up in 1 year.  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   02/03/2016-respiratory allergy panel, environmental allergens to dust mites, cat dander, dog dander, Timothy grass, cockroach, fungus, cedar trees, ragweed, rough pigweed, mouse urine, IgE is 689 >>>Largest elevations are to cat dander, dog dander, dust mites  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)  06/27/2016-pulmonary function test-FVC 3.03 (87% predicted), postbronchodilator ratio 54, postbronc  Basal cell carcinoma 08/20/2016   left cheek-cx3 excision   BCC (basal cell carcinoma) 09/20/2016   left cheek-mohs   CAD (coronary artery disease)    cath & CABG in 2008   Cardiomyopathy, 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   COPD (chronic obstructive pulmonary disease) ([place])    COPD with acute exacerbation ([place]) 04/02/2018   Diabetes ([place])    type 2   DM2 (diabetes mellitus, type 2) ([place]) 02/05/2013   Dyslipidemia    Environmental allergies 02/03/2016    02/03/2016-respiratory allergy panel, environmental allergens to dust mites, cat dander, dog dander, Timothy grass, cockroach, fungus, cedar trees, ragweed, rough pigweed, mouse urine, IgE is 689 >>>Largest elevations are to cat dander, dog dander, dust mites    Essential hypertension 02/03/2016   Former smoker 05/14/2018   Former smoker Quit 2008 43-pack-year smoking history   History of nuclear stress test 10/08/2007   bruce myoview; normal scan with attenuation artifact; no ischemia/infarct; low risk    Hypertriglyceridemia 02/03/2016   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 [name] - LIMA to LAD, sequential saphenous vein graft to OM1 and OM 2, SVG to posterior descending (done emergently)     Past Surgical History:  Procedure Laterality Date   CARDIAC CATHETERIZATION  2003   total occlusion of LAD with left to left collaterals, mod disease of RCA & mild disease in Cfx (Dr. Loni Muse. 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 Prescott Gum)   NASAL SEPTOPLASTY W/ TURBINOPLASTY  1970s   PROSTATE BIOPSY     TONSILLECTOMY      Current Medications: Current Meds  Medication Sig   albuterol (VENTOLIN HFA) 108 (90 Base) MCG/ACT inhaler Inhale 2 puffs into the lungs every 6 (six) hours as needed for wheezing or shortness of breath.   aspirin 81 MG chewable tablet Chew 81 mg by mouth daily.   Biotin 10000 MCG TABS Take 1 tablet by mouth daily.   Cholecalciferol (VITAMIN D3) 25 MCG (1000 UT) CAPS Take 1 capsule by mouth daily.   COVID-19 mRNA bivalent vaccine, Moderna, (MODERNA COVID-19 BIVAL BOOSTER) 50 MCG/0.5ML injection Inject into the muscle.   Cyanocobalamin (VITAMIN B-12 SL) Place 1 tablet under the tongue daily.   dapagliflozin propanediol  (FARXIGA) 5 MG TABS tablet Take by mouth daily.   diltiazem (CARDIZEM CD) 180 MG 24 hr capsule TAKE 1 CAPSULE(180 MG) BY MOUTH DAILY   fluticasone (FLONASE) 50 MCG/ACT nasal spray Place 1 spray into both nostrils as needed for allergies or rhinitis.   folic acid (FOLVITE) 1 MG tablet Take 1 mg by mouth daily.   losartan (COZAAR) 25 MG tablet Take 25 mg by mouth daily. as directed   Omega-3 Fatty Acids (FISH OIL PO) Take 1 capsule by mouth at bedtime.   pravastatin (PRAVACHOL) 40 MG tablet Take 1 tablet (40 mg total) by mouth daily.   SYMBICORT 80-4.5 MCG/ACT inhaler INHALE 2 PUFFS INTO THE LUNGS IN THE MORNING AND AT BEDTIME     Allergies:   Lisinopril   Social History   Socioeconomic History   Marital status: Married    Spouse name: Not on file   Number of children: Not  on file   Years of education: Not on file   Highest education level: Not on file  Occupational History   Not on file  Tobacco Use   Smoking status: Former    Packs/day: 1.00    Years: 43.00    Total pack years: 43.00    Types: Cigarettes    Start date: 12/25/1963    Quit date: 01/31/2007    Years since quitting: 15.2   Smokeless tobacco: Never  Vaping Use   Vaping Use: Never used  Substance and Sexual Activity   Alcohol use: Yes    Comment: few drinks a month   Drug use: No   Sexual activity: Not on file  Other Topics Concern   Not on file  Social History Narrative   Originally from [place], [place]. Moved to [place] in 1968. 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 He grew up in [place], [place]. Currently has a dog. No bird or mold exposure.    Social Determinants of Health   Financial Resource Strain: Not on file  Food Insecurity: Not on file  Transportation Needs: Not on file  Physical Activity: Not on file  Stress: Not on file  Social Connections: Not on file     Family History: The patient's family history includes Alzheimer's disease in his father; CAD  in his father; Diabetes in his mother; Heart attack (age of onset: 26) in his father; Stroke in his maternal grandmother. There is no history of Lung disease.  ROS:   Please see the history of present illness.     All other systems reviewed and are negative.  EKGs/Labs/Other Studies Reviewed:    The following studies were reviewed today:  Echo 03/08/2016 LV EF: 50% -   55%   -------------------------------------------------------------------  Indications:     Cardiomyopathy (I25.5).   -------------------------------------------------------------------  Study Conclusions   - Left ventricle: The cavity size was normal. There was mild    concentric hypertrophy. Systolic function was normal. The    estimated ejection fraction was in the range of 50% to 55%. Wall    motion was normal; there were no regional wall motion    abnormalities. Doppler parameters are consistent with abnormal    left ventricular relaxation (grade 1 diastolic dysfunction). The    E/e&' ratio is between 8-15, suggesting indeterminate LV filling    pressure./  - Mitral valve: Mildly thickened leaflets . There was mild    regurgitation.  - Left atrium: The atrium was normal in size.  - Right ventricle: The cavity size was mildly dilated. Systolic    function is mildly reduced.  - Right atrium: The atrium was mildly dilated.  - Tricuspid valve: There was trivial regurgitation.  - Pulmonary arteries: PA peak pressure: 28 mm Hg (S).  - Inferior vena cava: The vessel was normal in size. The    respirophasic diameter changes were in the normal range (= 50%),    consistent with normal central venous pressure.   Impressions:   - LVEF 50-55%, mild LVH, basal inferior hypokinesis, diastolic    dysfunction, indeterminate LV filling pressure, normal LA size,    mild LAE, trivial TR, normal RVSP, normal IVC   EKG:  EKG is not ordered today.   Recent Labs: No results found for requested labs within last 365 days.   Recent Lipid Panel No results found for: "CHOL", "TRIG", "HDL", "CHOLHDL", "VLDL", "LDLCALC", "LDLDIRECT"   Risk Assessment/Calculations:  CHA2DS2-VASc Score = 5   This indicates a 7.2% annual risk of stroke. The patient's score is based upon: CHF History: 0 HTN History: 1 Diabetes History: 1 Stroke History: 0 Vascular Disease History: 1 Age Score: 2 Gender Score: 0          Physical Exam:    VS:  BP 108/60   Pulse 62   Ht '5\' 6"'$  (1.676 m)   Wt 143 lb 3.2 oz (65 kg)   SpO2 97%   BMI 23.11 kg/m         Wt Readings from Last 3 Encounters:  04/18/22 143 lb 3.2 oz (65 kg)  02/06/22 144 lb (65.3 kg)  01/19/22 141 lb 8 oz (64.2 kg)     GEN:  Well nourished, well developed in no acute distress HEENT: Normal NECK: No JVD; No carotid bruits LYMPHATICS: No lymphadenopathy CARDIAC: RRR, no murmurs, rubs, gallops RESPIRATORY:  Clear to auscultation without rales, wheezing or rhonchi  ABDOMEN: Soft, non-tender, non-distended MUSCULOSKELETAL:  No edema; No deformity  SKIN: Warm and dry NEUROLOGIC:  Alert and oriented x 3 PSYCHIATRIC:  Normal affect   ASSESSMENT:    1. Coronary artery disease involving coronary bypass graft of native heart without angina pectoris   2. PAF (paroxysmal atrial fibrillation) ([place])   3. Essential hypertension   4. Hyperlipidemia LDL goal <70   5. Controlled type 2 diabetes mellitus without complication, without long-term current use of insulin (HCC)    PLAN:    In order of problems listed above:  CAD s/p CABG: Denies any recent chest pain.  PAF: The patient previously had a discussion with Dr. Curt Bears, he had significant bruising on anticoagulation therapy, ultimately, he was switched to aspirin therapy.  He is aware that he will have increased chance for stroke and should monitor for any stroke symptoms.  Hypertension: Blood pressure stable on current therapy  Hyperlipidemia: On pravastatin  DM2: Managed by primary care  provider.           Medication Adjustments/Labs and Tests Ordered: Current medicines are reviewed at length with the patient today.  Concerns regarding medicines are outlined above.  No orders of the defined types were placed in this encounter.  No orders of the defined types were placed in this encounter.   Patient Instructions  Medication Instructions:   Your physician recommends that you continue on your current medications as directed. Please refer to the Current Medication list given to you today.  *If you need a refill on your cardiac medications before your next appointment, please call your pharmacy*  Lab Work: NONE ordered at this time of appointment   If you have labs (blood work) drawn today and your tests are completely normal, you will receive your results only by: [place] (if you have MyChart) OR A paper copy in the mail If you have any lab test that is abnormal or we need to change your treatment, we will call you to review the results.  Testing/Procedures: NONE ordered at this time of appointment   Follow-Up: At [facility], you and your health needs are our priority.  As part of our continuing mission to provide you with exceptional heart care, we have created designated Provider Care Teams.  These Care Teams include your primary Cardiologist (physician) and Advanced Practice Providers (APPs -  Physician Assistants and Nurse Practitioners) who all work together to provide you with the care you need, when you need it.  Your next appointment:  1 year(s)  Provider:   Pixie Casino, [place]     Other Instructions     Signed, Almyra Deforest, [place]  04/19/2022 12:16 AM    [place]

## 2022-04-18 NOTE — Patient Instructions (Signed)
Medication Instructions:   Your physician recommends that you continue on your current medications as directed. Please refer to the Current Medication list given to you today.  *If you need a refill on your cardiac medications before your next appointment, please call your pharmacy*  Lab Work: NONE ordered at this time of appointment   If you have labs (blood work) drawn today and your tests are completely normal, you will receive your results only by: [place] (if you have MyChart) OR A paper copy in the mail If you have any lab test that is abnormal or we need to change your treatment, we will call you to review the results.  Testing/Procedures: NONE ordered at this time of appointment   Follow-Up: At [facility], you and your health needs are our priority.  As part of our continuing mission to provide you with exceptional heart care, we have created designated Provider Care Teams.  These Care Teams include your primary Cardiologist (physician) and Advanced Practice Providers (APPs -  Physician Assistants and Nurse Practitioners) who all work together to provide you with the care you need, when you need it.  Your next appointment:   1 year(s)  Provider:   Pixie Casino, MD     Other Instructions

## 2022-04-19 ENCOUNTER — Encounter: Payer: Self-pay | Admitting: Physician Assistant

## 2022-04-19 NOTE — Addendum Note (Signed)
Addended byEulas Post, Christepher Melchior on: 04/19/2022 12:18 AM   Modules accepted: Level of Service

## 2022-04-23 ENCOUNTER — Other Ambulatory Visit: Payer: Self-pay | Admitting: Cardiology

## 2022-05-01 DIAGNOSIS — L239 Allergic contact dermatitis, unspecified cause: Secondary | ICD-10-CM | POA: Diagnosis not present

## 2022-05-02 ENCOUNTER — Ambulatory Visit (HOSPITAL_BASED_OUTPATIENT_CLINIC_OR_DEPARTMENT_OTHER): Payer: Medicare Other | Admitting: Pulmonary Disease

## 2022-05-18 ENCOUNTER — Other Ambulatory Visit: Payer: Self-pay | Admitting: Pulmonary Disease

## 2022-05-18 ENCOUNTER — Encounter: Payer: Self-pay | Admitting: Nurse Practitioner

## 2022-05-18 ENCOUNTER — Ambulatory Visit: Payer: Medicare Other | Admitting: Nurse Practitioner

## 2022-05-18 ENCOUNTER — Telehealth: Payer: Self-pay | Admitting: Nurse Practitioner

## 2022-05-18 VITALS — BP 126/78 | HR 75 | Ht 66.0 in | Wt 145.2 lb

## 2022-05-18 DIAGNOSIS — J3489 Other specified disorders of nose and nasal sinuses: Secondary | ICD-10-CM | POA: Diagnosis not present

## 2022-05-18 DIAGNOSIS — J441 Chronic obstructive pulmonary disease with (acute) exacerbation: Secondary | ICD-10-CM | POA: Diagnosis not present

## 2022-05-18 DIAGNOSIS — J069 Acute upper respiratory infection, unspecified: Secondary | ICD-10-CM

## 2022-05-18 DIAGNOSIS — R059 Cough, unspecified: Secondary | ICD-10-CM

## 2022-05-18 DIAGNOSIS — J45901 Unspecified asthma with (acute) exacerbation: Secondary | ICD-10-CM | POA: Diagnosis not present

## 2022-05-18 LAB — POC INFLUENZA A&B (BINAX/QUICKVUE)
Influenza A, POC: NEGATIVE
Influenza B, POC: NEGATIVE

## 2022-05-18 LAB — POC COVID19 BINAXNOW: SARS Coronavirus 2 Ag: NEGATIVE

## 2022-05-18 MED ORDER — ALBUTEROL SULFATE HFA 108 (90 BASE) MCG/ACT IN AERS: 2.0000 | INHALATION_SPRAY | Freq: Four times a day (QID) | RESPIRATORY_TRACT | 2 refills | Status: DC | PRN

## 2022-05-18 MED ORDER — PREDNISONE 10 MG PO TABS: 40.0000 mg | ORAL_TABLET | Freq: Every day | ORAL | 0 refills | Status: DC

## 2022-05-18 MED ORDER — BENZONATATE 100 MG PO CAPS: 100.0000 mg | ORAL_CAPSULE | Freq: Four times a day (QID) | ORAL | 1 refills | Status: DC | PRN

## 2022-05-18 NOTE — Assessment & Plan Note (Signed)
COVID/flu testing today negative. He would be out of the window for viral testing but he requested to be swabbed as his wife is also sick now. Supportive care measures advised. See above plan.

## 2022-05-18 NOTE — Telephone Encounter (Signed)
PT wife calling saying her husbands script has not been called in yet. AVS notes read:    Benzonatate 1 capsule Three times a day as needed for cough Prednisone 40 mg daily for 5 days. Take in AM with food  Albuteral refill   Pharm: [facility] on [address] ([facility])

## 2022-05-18 NOTE — Patient Instructions (Addendum)
Continue Symbicort 2 puffs Twice daily. Brush tongue and rinse mouth afterwards Continue Albuterol inhaler 2 puffs every 6 hours as needed for shortness of breath or wheezing. Notify if symptoms persist despite rescue inhaler/neb use.  Continue Flonase nasal spray 2 sprays each nostril daily Continue Over the counter antihistamine such as zyrtec, xyzal, claritin or allegra  Mucinex DM Twice daily as needed for congestion   Benzonatate 1 capsule Three times a day as needed for cough Prednisone 40 mg daily for 5 days. Take in AM with food   Call if your cough becomes more productive, you develop fevers or your symptoms worsen   Follow up in 2 weeks with Dr. [name] or Alanson Aly. If symptoms do not improve or worsen, please contact office for sooner follow up or seek emergency care.

## 2022-05-18 NOTE — Assessment & Plan Note (Signed)
AECOPD/asthma secondary to URI. Non-toxic and VS stable on room air. Infectious symptoms are improving with minimally productive cough and likely viral so hold off on imaging and antimicrobial therapy. We will treat him with prednisone burst and continue bronchodilator therapies. Strict return precautions. Action plan in place.  Patient Instructions  Continue Symbicort 2 puffs Twice daily. Brush tongue and rinse mouth afterwards Continue Albuterol inhaler 2 puffs every 6 hours as needed for shortness of breath or wheezing. Notify if symptoms persist despite rescue inhaler/neb use.  Continue Flonase nasal spray 2 sprays each nostril daily Continue Over the counter antihistamine such as zyrtec, xyzal, claritin or allegra  Mucinex DM Twice daily as needed for congestion   Benzonatate 1 capsule Three times a day as needed for cough Prednisone 40 mg daily for 5 days. Take in AM with food   Call if your cough becomes more productive, you develop fevers or your symptoms worsen   Follow up in 2 weeks with Dr. [name] or Alanson Aly. If symptoms do not improve or worsen, please contact office for sooner follow up or seek emergency care.

## 2022-05-18 NOTE — Progress Notes (Signed)
$'@Patient'P$  ID: [name], male    DOB: 07/31/1940, 82 y.o.   MRN: [id number]  Chief Complaint  Patient presents with   Follow-up    Pt f/u states he is having a cough, nasal drainage, some wheezing since 2/25. He took an at home test and it was negative. He is concerned he is having some increased SOB    Referring provider: [name], MD  HPI: 82 year old male, former smoker followed for asthma/COPD overlap.  He is a patient Dr. [name] and last seen in office 01/19/2022.  Past medical history significant for CAD status post CABG x4, hypertension, ischemic cardiomyopathy, DM 2, HLD, environmental allergies.  TEST/EVENTS:  11/28/2015 PFT: FVC 92, FEV1 58, ratio 45, TLC 133, DLCO 45 06/27/2016 PFT: FVC 87, FEV1 65, ratio 54+BD  05/20/2018 CT chest: There is a nodular area of consolidation in the peribronchovascular area and hazy airspace opacities in the right lower lobe, may reflect multifocal bronchopneumonia for cryptogenic organizing pneumonia.  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 08/22/2018 CT chest without contrast: Previously seen density in the right upper lobe has improved, residual compatible with scarring.  06/22/2021: OV with Dr. [name].  Seems to be having a mild exacerbation related to pollen exposure.  Cough and congestion is improving.  He has decreased his breath sounds on the left base; CXR ordered for further evaluation which was stable.  Advised that he continue Symbicort and use Mucinex as needed.  Instructed to contact if symptoms worsen for course of prednisone.  Follow-up 6 months.  12/22/2021: OV with Garo Heidelberg NP for intended follow-up however he is also having some acute symptoms.  He first developed a increased productive cough around 2 months ago.  He describes the sputum as gray.  Feels like his cough waxes and wanes.  Could be related to allergies per his report. He doesn't have any significant congestion or drainage; occasionally feels like he has postnasal drip. He has had  some recent increase in wheezing and feels slightly more short winded than normal.  Feels like activities around the house have been a little more difficult for him over the past few weeks to a month.  He denies any fevers, chills, hemoptysis, lower extremity swelling, orthopnea.  No known sick exposures.  Did not feel like he had any URI symptoms that onset his cough.  He is using Symbicort daily.  CXR with mild bibasilar atelectasis. Treated for AECOPD/asthma with azithromycin and prednisone.   01/19/2022: Ov with Calina Patrie NP for follow up after being treated for AECOPD/asthma. He completed prednisone and antibiotics.  Feels much better today.  Symptoms feel like they are back to his baseline.  Does still have an occasional cough but overall, much improved. Minimal production; occasional clear sputum. Doesn't feel like he's had much wheezing. No fevers, chills, hemoptysis, orthopnea, leg swelling. He is using symbicort twice daily, takes OTC antihistamine and flonase.   05/18/2022: Today - acute Patient presents today for acute visit. On Sunday, he started having nasal congestion with clear drainage, some pressure in his face, and occasional cough. He did take an at home COVID test which was negative. His sinus symptoms and cough have started to improve some. Still getting up a small amount of clear phlegm at times. Now having increased shortness of breath and noticing more wheezing. He uses his rescue inhaler, which helps. He denies any fevers, chills, hemoptysis, leg swelling, orthopnea. Eating and drinking well. Using Symbicort twice a day. Taking flonase and zyrtec.  Allergies  Allergen Reactions   Lisinopril Other (See Comments)    Immunization History  Administered Date(s) Administered   Influenza, High Dose Seasonal PF 11/28/2015, 01/03/2016, 12/17/2016, 11/30/2017, 12/04/2019   Influenza, Quadrivalent, Recombinant, Inj, Pf 12/06/2017, 12/19/2018, 12/16/2020   Influenza,inj,quad, With Preservative  12/17/2017   Influenza-Unspecified 01/11/2011, 01/11/2012   Moderna Covid-19 Vaccine Bivalent Booster 44yr & up 12/20/2020   Moderna SARS-COV2 Booster Vaccination 01/12/2020   Pneumococcal Conjugate-13 07/24/2011, 06/23/2015, 03/22/2016, 03/08/2017   Pneumococcal Polysaccharide-23 07/07/2009   Td 07/24/2011   Zoster Recombinat (Shingrix) 12/07/2016, 02/16/2017, 03/08/2017    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   02/03/2016-respiratory allergy panel, environmental allergens to dust mites, cat dander, dog dander, Timothy grass, cockroach, fungus, cedar trees, ragweed, rough pigweed, mouse urine, IgE is 689 >>>Largest elevations are to cat dander, dog dander, dust mites  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)  06/27/2016-pulmonary function test-FVC 3.03 (87% predicted), postbronchodilator ratio 54, postbronc   Basal cell carcinoma 08/20/2016   left cheek-cx3 excision   BCC (basal cell carcinoma) 09/20/2016   left cheek-mohs   CAD (coronary artery disease)    cath & CABG in 2008   Cardiomyopathy, 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   COPD (chronic obstructive pulmonary disease) (HCC)    COPD with acute exacerbation ([facility] 04/02/2018   Diabetes ([facility]    type 2   DM2 (diabetes mellitus, type 2) ([facility] 02/05/2013   Dyslipidemia    Environmental allergies 02/03/2016   02/03/2016-respiratory allergy panel, environmental allergens to dust mites, cat dander, dog dander, Timothy grass, cockroach, fungus, cedar trees,  ragweed, rough pigweed, mouse urine, IgE is 689 >>>Largest elevations are to cat dander, dog dander, dust mites    Essential hypertension 02/03/2016   Former smoker 05/14/2018   Former smoker Quit 2008 43-pack-year smoking history   History of nuclear stress test 10/08/2007   bruce myoview; normal scan with attenuation artifact; no ischemia/infarct; low risk    Hypertriglyceridemia 02/03/2016   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 VDarcey Nora- LIMA to LAD, sequential saphenous vein graft to OM1 and OM 2, SVG to posterior descending (done emergently)     Tobacco History: Social History   Tobacco Use  Smoking Status Former   Packs/day: 1.00   Years: 43.00   Total pack years: 43.00   Types: Cigarettes   Start date: 12/25/1963   Quit date: 01/31/2007   Years since quitting: 15.3  Smokeless Tobacco Never   Counseling given: Not Answered   Outpatient Medications Prior to Visit  Medication Sig Dispense Refill   albuterol (VENTOLIN HFA) 108 (90 Base) MCG/ACT inhaler Inhale 2 puffs into the lungs every 6 (six) hours as needed for wheezing or shortness of breath. 8 g 2   aspirin 81 MG chewable tablet Chew 81 mg by mouth daily.     Biotin 10000 MCG TABS Take 1 tablet by mouth daily.     Cholecalciferol (VITAMIN D3) 25 MCG (  1000 UT) CAPS Take 1 capsule by mouth daily.     COVID-19 mRNA bivalent vaccine, Moderna, (MODERNA COVID-19 BIVAL BOOSTER) 50 MCG/0.5ML injection Inject into the muscle. 0.5 mL 0   Cyanocobalamin (VITAMIN B-12 SL) Place 1 tablet under the tongue daily.     dapagliflozin propanediol (FARXIGA) 5 MG TABS tablet Take by mouth daily.     diltiazem (CARDIZEM CD) 180 MG 24 hr capsule TAKE 1 CAPSULE(180 MG) BY MOUTH DAILY 90 capsule 2   fluticasone (FLONASE) 50 MCG/ACT nasal spray Place 1 spray into both nostrils as needed for allergies or  rhinitis.     folic acid (FOLVITE) 1 MG tablet Take 1 mg by mouth daily.     losartan (COZAAR) 25 MG tablet Take 25 mg by mouth daily. as directed  1   nitroGLYCERIN (NITROSTAT) 0.4 MG SL tablet Place 0.4 mg under the tongue every 5 (five) minutes x 3 doses as needed.     Omega-3 Fatty Acids (FISH OIL PO) Take 1 capsule by mouth at bedtime.     pravastatin (PRAVACHOL) 40 MG tablet Take 1 tablet (40 mg total) by mouth daily. 90 tablet 3   SYMBICORT 80-4.5 MCG/ACT inhaler INHALE 2 PUFFS INTO THE LUNGS IN THE MORNING AND AT BEDTIME 10.2 g 3   No facility-administered medications prior to visit.     Review of Systems:   Constitutional: No weight loss or gain, night sweats, fevers, chills, fatigue, or lassitude. HEENT: No headaches, difficulty swallowing, tooth/dental problems, or sore throat. No sneezing, itching, ear ache. + nasal congestion, post nasal drip CV:  No chest pain, orthopnea, PND, swelling in lower extremities, anasarca, dizziness, palpitations, syncope Resp: +shortness of breath with exertion; wheeze; minimally productive cough. No hemoptysis. No wheezing.  No chest wall deformity GI:  No heartburn, indigestion, abdominal pain, nausea, vomiting, diarrhea, change in bowel habits, loss of appetite, bloody stools.  Skin: No rashes, lesions, ulcerations Neuro: No dizziness or lightheadedness.  Psych: No depression or anxiety. Mood stable.     Physical Exam:  BP 126/78   Pulse 75   Ht '5\' 6"'$  (1.676 m)   Wt 145 lb 3.2 oz (65.9 kg)   SpO2 92%   BMI 23.44 kg/m   GEN: Pleasant, interactive, well-appearing; elderly; in no acute distress. HEENT:  Normocephalic and atraumatic. PERRLA. Sclera white. Nasal turbinates erythematous, moist and patent bilaterally. Clear rhinorrhea present. Oropharynx pink and moist, without exudate or edema. No lesions, ulcerations NECK:  Supple w/ fair ROM. No JVD present. No lymphadenopathy.   CV: RRR, no m/r/g, no peripheral edema. Pulses intact, +2  bilaterally. No cyanosis, pallor or clubbing. PULMONARY:  Unlabored, regular breathing. Scattered expiratory wheezes bilaterally A&P. No accessory muscle use.  GI: BS present and normoactive. Soft, non-tender to palpation. No organomegaly or masses detected.  MSK: No erythema, warmth or tenderness. Cap refil <2 sec all extrem. No deformities or joint swelling noted.  Neuro: A/Ox3. No focal deficits noted.   Skin: Warm, no lesions or rashe Psych: Normal affect and behavior. Judgement and thought content appropriate.     Lab Results:  CBC    Component Value Date/Time   WBC 8.0 07/10/2008 0855   RBC 4.42 07/10/2008 0855   HGB 14.6 09/16/2015 1749   HCT 43.0 09/16/2015 1749   PLT 299 07/10/2008 0855   MCV 89.0 07/10/2008 0855   MCHC 34.2 07/10/2008 0855   RDW 12.7 07/10/2008 0855   LYMPHSABS 2.0 07/04/2008 0358   MONOABS 0.7 07/04/2008 0358  EOSABS 0.3 07/04/2008 0358   BASOSABS 0.0 07/04/2008 0358    BMET    Component Value Date/Time   NA 140 12/28/2016 1014   K 4.6 12/28/2016 1014   CL 105 12/28/2016 1014   CO2 28 12/28/2016 1014   GLUCOSE 127 (H) 12/28/2016 1014   BUN 16 12/28/2016 1014   CREATININE 0.75 12/28/2016 1014   CALCIUM 8.9 12/28/2016 1014   GFRNONAA >60 07/10/2008 0855   GFRAA  07/10/2008 0855    >60        The eGFR has been calculated using the MDRD equation. This calculation has not been validated in all clinical situations. eGFR's persistently <60 mL/min signify possible Chronic Kidney Disease.    BNP No results found for: "BNP"   Imaging:  No results found.       Latest Ref Rng & Units 06/27/2016    3:09 PM 03/16/2016    9:48 AM 11/28/2015    2:42 PM  PFT Results  FVC-Pre L 3.03  2.74  3.23   FVC-Predicted Pre % 87  78  92   FVC-Post L 3.47  3.27    FVC-Predicted Post % 99  93    Pre FEV1/FVC % % 53  54  45   Post FEV1/FCV % % 54  53    FEV1-Pre L 1.61  1.49  1.47   FEV1-Predicted Pre % 65  60  58   FEV1-Post L 1.86  1.74     DLCO uncorrected ml/min/mmHg   12.22   DLCO UNC% %   45   DLVA Predicted %   53   TLC L   8.31   TLC % Predicted %   133   RV % Predicted %   203     No results found for: "NITRICOXIDE"      Assessment & Plan:   Acute exacerbation of COPD with asthma (HCC) AECOPD/asthma secondary to URI. Non-toxic and VS stable on room air. Infectious symptoms are improving with minimally productive cough and likely viral so hold off on imaging and antimicrobial therapy. We will treat him with prednisone burst and continue bronchodilator therapies. Strict return precautions. Action plan in place.  Patient Instructions  Continue Symbicort 2 puffs Twice daily. Brush tongue and rinse mouth afterwards Continue Albuterol inhaler 2 puffs every 6 hours as needed for shortness of breath or wheezing. Notify if symptoms persist despite rescue inhaler/neb use.  Continue Flonase nasal spray 2 sprays each nostril daily Continue Over the counter antihistamine such as zyrtec, xyzal, claritin or allegra  Mucinex DM Twice daily as needed for congestion   Benzonatate 1 capsule Three times a day as needed for cough Prednisone 40 mg daily for 5 days. Take in AM with food   Call if your cough becomes more productive, you develop fevers or your symptoms worsen   Follow up in 2 weeks with Dr. [name] or Alanson Aly. If symptoms do not improve or worsen, please contact office for sooner follow up or seek emergency care.   URI (upper respiratory infection) COVID/flu testing today negative. He would be out of the window for viral testing but he requested to be swabbed as his wife is also sick now. Supportive care measures advised. See above plan.    I spent 35 minutes of dedicated to the care of this patient on the date of this encounter to include pre-visit review of records, face-to-face time with the patient discussing conditions above, post visit ordering of testing, clinical documentation  with the electronic  health record, making appropriate referrals as documented, and communicating necessary findings to members of the patients care team.  Clayton Bibles, NP 05/18/2022  Pt aware and understands NP's role.

## 2022-05-18 NOTE — Telephone Encounter (Signed)
Spoke with pt's wife Hassan Rowan (per DPR) and notified her tessalon and prednisone were called in just now. Hassan Rowan also requested refill of albuterol HFA. All orders were placed. Nothing further needed at this time.

## 2022-05-21 ENCOUNTER — Telehealth: Payer: Self-pay | Admitting: Nurse Practitioner

## 2022-05-23 ENCOUNTER — Telehealth: Payer: Self-pay | Admitting: Nurse Practitioner

## 2022-05-23 NOTE — Telephone Encounter (Signed)
Pred was called in for PT and wife concerned because the RX was not issued as tapered.   Take 4 tablets (40 mg total) by mouth daily with breakfast.   Pls call to advise why not. Wife was the one who called in. Ret # [phone] is HER #

## 2022-05-23 NOTE — Telephone Encounter (Signed)
No need to taper doses 40 mg or less for <7 days. Thanks.

## 2022-05-23 NOTE — Telephone Encounter (Signed)
ATC X1 someone answered the phone but I could not hear them. Will try again later

## 2022-05-23 NOTE — Telephone Encounter (Signed)
Can you please advise as to why prednisone is not tapered the wife would like to know

## 2022-05-24 NOTE — Telephone Encounter (Signed)
I called and spoke with the pt  I apologized about the long wait when she called and also for rx being sent to the wrong pharm  She was able to get rxs from [facility] and pt is taking as prescribed  I answered her question about the pred taper, and let her know that per Joellen Jersey, ok for no taper when taking 40 mg x 7 days  She verbalized understanding and was appreciative of the call back  She denied any further questions or concerns

## 2022-05-24 NOTE — Telephone Encounter (Signed)
PT wife calling wanting office manager and screaming so much I had to ask her to let me know when she was done. Issues w/hold time, script request mess ups (Wrong Pharm called) . Mel Almond not here. I am asking Magda Paganini to call her. Also will let Mel Almond know. She claims no call backs.

## 2022-05-24 NOTE — Telephone Encounter (Signed)
Hassan Rowan wife is returning phone call. Hassan Rowan phone number is [phone].

## 2022-06-01 ENCOUNTER — Ambulatory Visit (HOSPITAL_BASED_OUTPATIENT_CLINIC_OR_DEPARTMENT_OTHER): Payer: Medicare Other | Admitting: Pulmonary Disease

## 2022-06-01 ENCOUNTER — Encounter (HOSPITAL_BASED_OUTPATIENT_CLINIC_OR_DEPARTMENT_OTHER): Payer: Self-pay | Admitting: Pulmonary Disease

## 2022-06-01 VITALS — BP 114/76 | HR 70 | Ht 66.0 in | Wt 140.6 lb

## 2022-06-01 DIAGNOSIS — J302 Other seasonal allergic rhinitis: Secondary | ICD-10-CM | POA: Diagnosis not present

## 2022-06-01 DIAGNOSIS — J4489 Other specified chronic obstructive pulmonary disease: Secondary | ICD-10-CM | POA: Diagnosis not present

## 2022-06-01 MED ORDER — BENZONATATE 100 MG PO CAPS: 100.0000 mg | ORAL_CAPSULE | Freq: Four times a day (QID) | ORAL | 1 refills | Status: DC | PRN

## 2022-06-01 NOTE — Assessment & Plan Note (Signed)
Due to positive bronchodilator response on PFTs, he has been maintained on steroid/LABA combination. He had 2 flareups over the last 6 months and I would consider escalating to triple therapy however he prefers to continue Symbicort for now. We discussed signs and symptoms of COPD exacerbation and action plan

## 2022-06-01 NOTE — Patient Instructions (Signed)
X Refill on benzonatate perles OTC delsym ok for cough

## 2022-06-01 NOTE — Progress Notes (Signed)
   Subjective:    Patient ID: [name], male    DOB: September 16, 1940, 82 y.o.   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  43-pack-year smoking history   Chief Complaint  Patient presents with   Follow-up    Pt states he has been doing okay since last visit. States he still has a productive cough but is now coughing up clear phlegm.    He had an acute office visit 3/1 after URI symptoms and was given prednisone and benzonatate. He had a similar exacerbation in 12/2021 and was given Z-Pak and prednisone.  2-week follow-up visit shortness of breath is improved.  He still has minimal clear sputum production and coughs about 2 or 3 times per day. Medication has helped.  He is compliant with Symbicort and has had to use albuterol recently but is settling back down to his baseline.   Significant tests/ events reviewed   CT chest 08/2018 Moderate centrilobular emphysema. Platelike scarring in the inferior right upper lobe    PFT 06/2016: FVC 3.03 L (87%) FEV1 1.61 L (65%) FEV1/FVC 0.53  positive bronchodilator response 02/2016: FVC 2.74 L (78%) FEV1 1.49 L (60%) FEV1/FVC 0.54  positive bronchodilator response 12/2015: FVC 3.23 L (92%) FEV1 1.47 L (58%) FEV1/FVC 0.45  TLC 8.31 L (133%) RV 203% ERV 218% DLCO uncorrected 45%     LABS 02/03/16 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) RAST Panel:  Cat Dander 27.5 / D farinae 9.29 / Dog Dander 6.74 / Multiple Positives IgE:  689    Review of Systems neg for any significant sore throat, dysphagia, itching, sneezing, nasal congestion or excess/ purulent secretions, fever, chills, sweats, unintended wt loss, pleuritic or exertional cp, hempoptysis, orthopnea pnd or change in chronic leg swelling. Also denies presyncope, palpitations, heartburn, abdominal pain, nausea, vomiting, diarrhea or change in bowel or urinary habits, dysuria,hematuria, rash, arthralgias, visual complaints,  headache, numbness weakness or ataxia.     Objective:   Physical Exam  Gen. Pleasant, elderly, well-nourished, in no distress ENT - no thrush, no pallor/icterus,no post nasal drip Neck: No JVD, no thyromegaly, no carotid bruits Lungs: no use of accessory muscles, no dullness to percussion, decreased without rales or rhonchi  Cardiovascular: Rhythm regular, heart sounds  normal, no murmurs or gallops, no peripheral edema Musculoskeletal: No deformities, no cyanosis or clubbing        Assessment & Plan:

## 2022-06-01 NOTE — Assessment & Plan Note (Signed)
Flonase and OTC antihistamine

## 2022-06-10 IMAGING — DX DG LUMBAR SPINE 2-3V
3 series · 3 of 3 positions shown · non-contrast
Comparison: None.

CLINICAL DATA: Left-sided sciatica. Left hip pain. History of
prostate cancer.

EXAM:
LUMBAR SPINE - 2-3 VIEW

[l-spine ap]
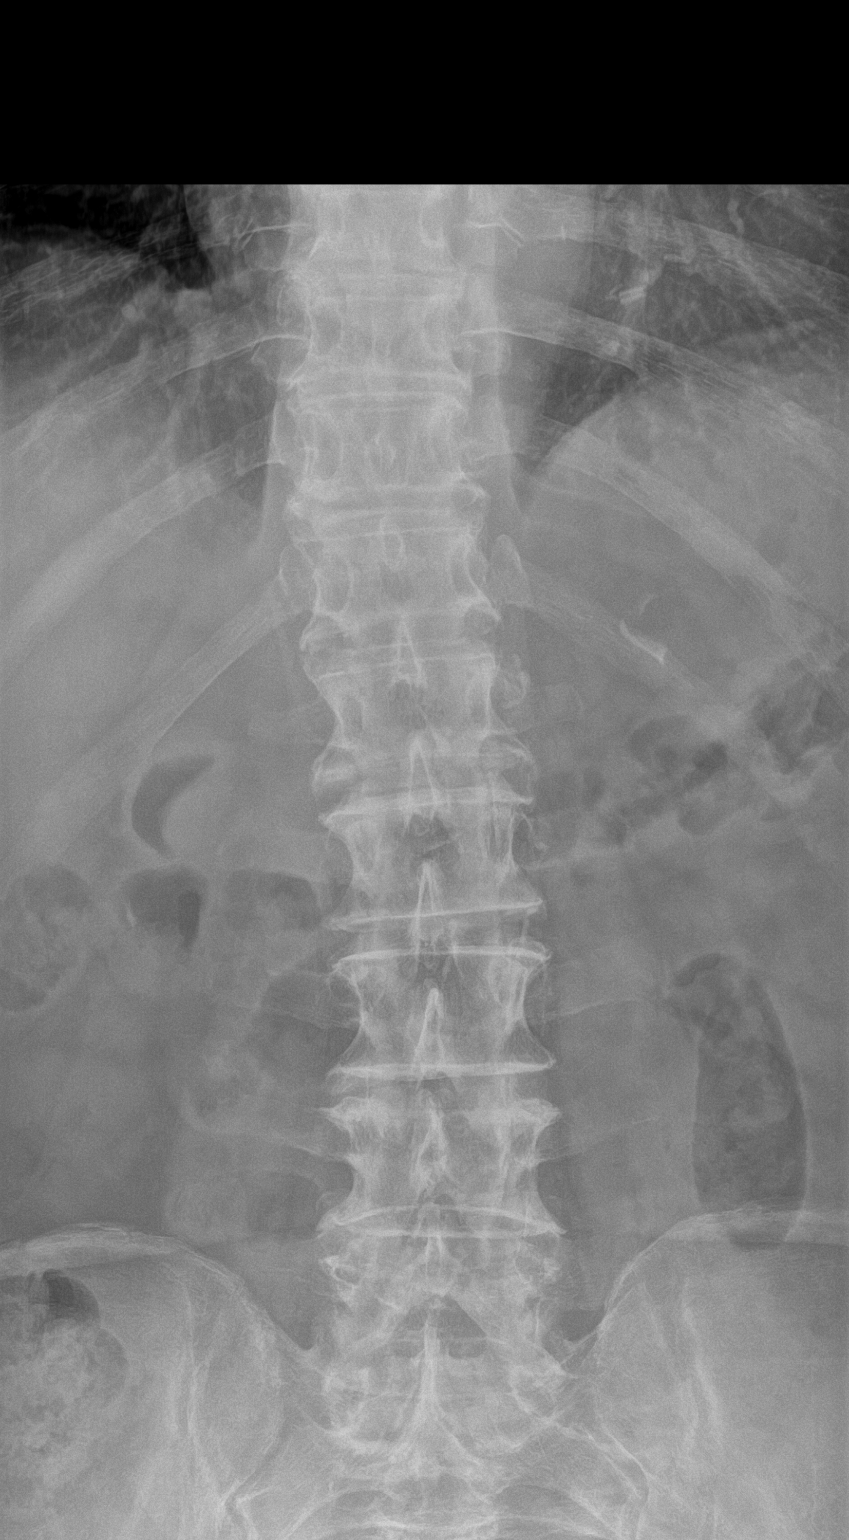

[l-spine lateral (1 of 2)]
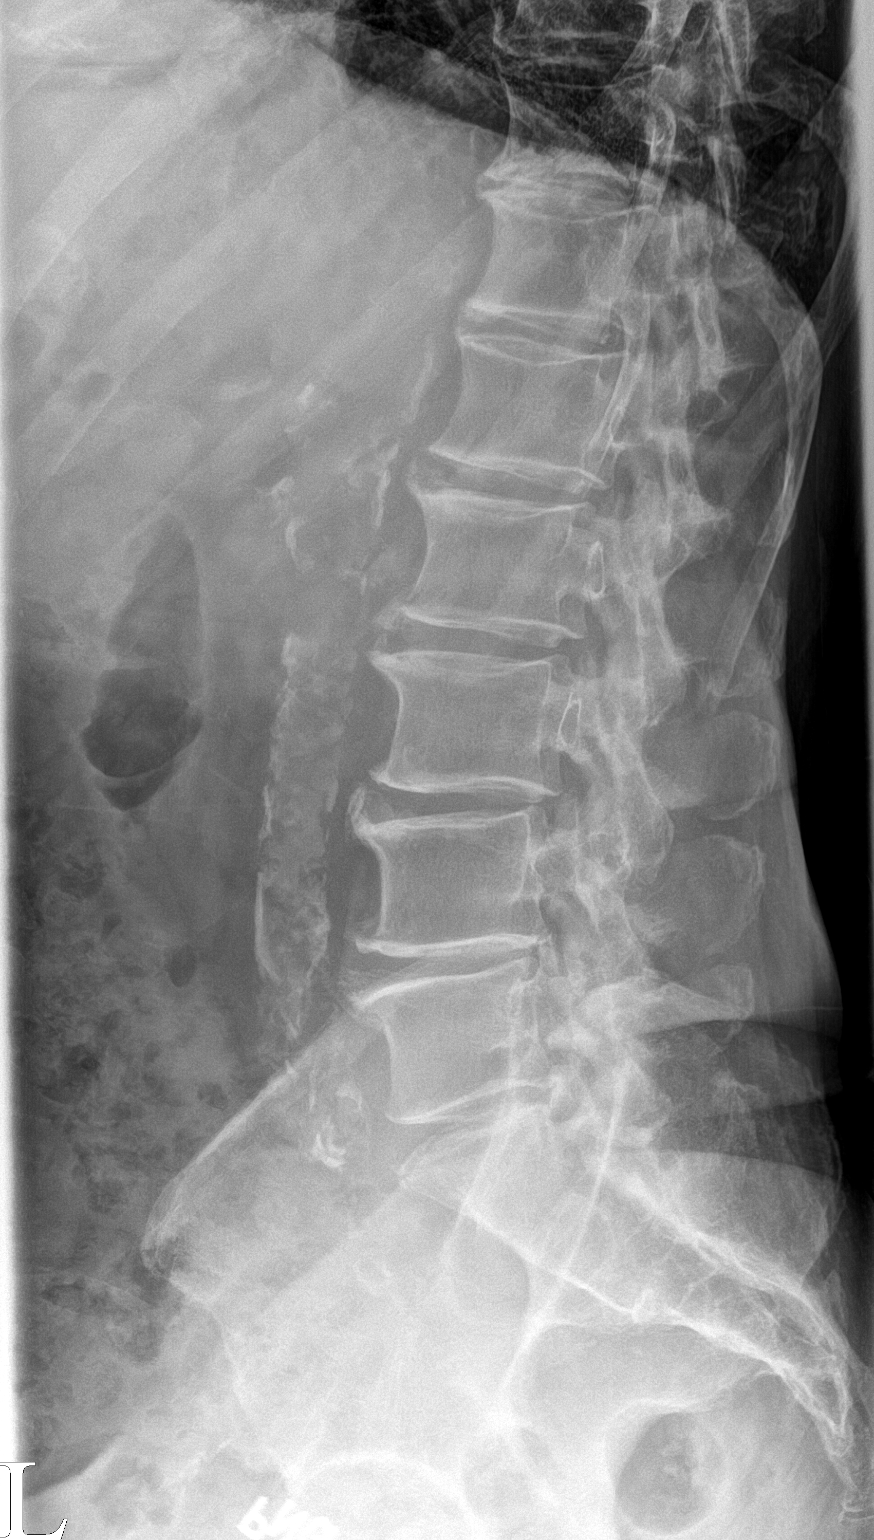

[l-spine lateral (2 of 2)]
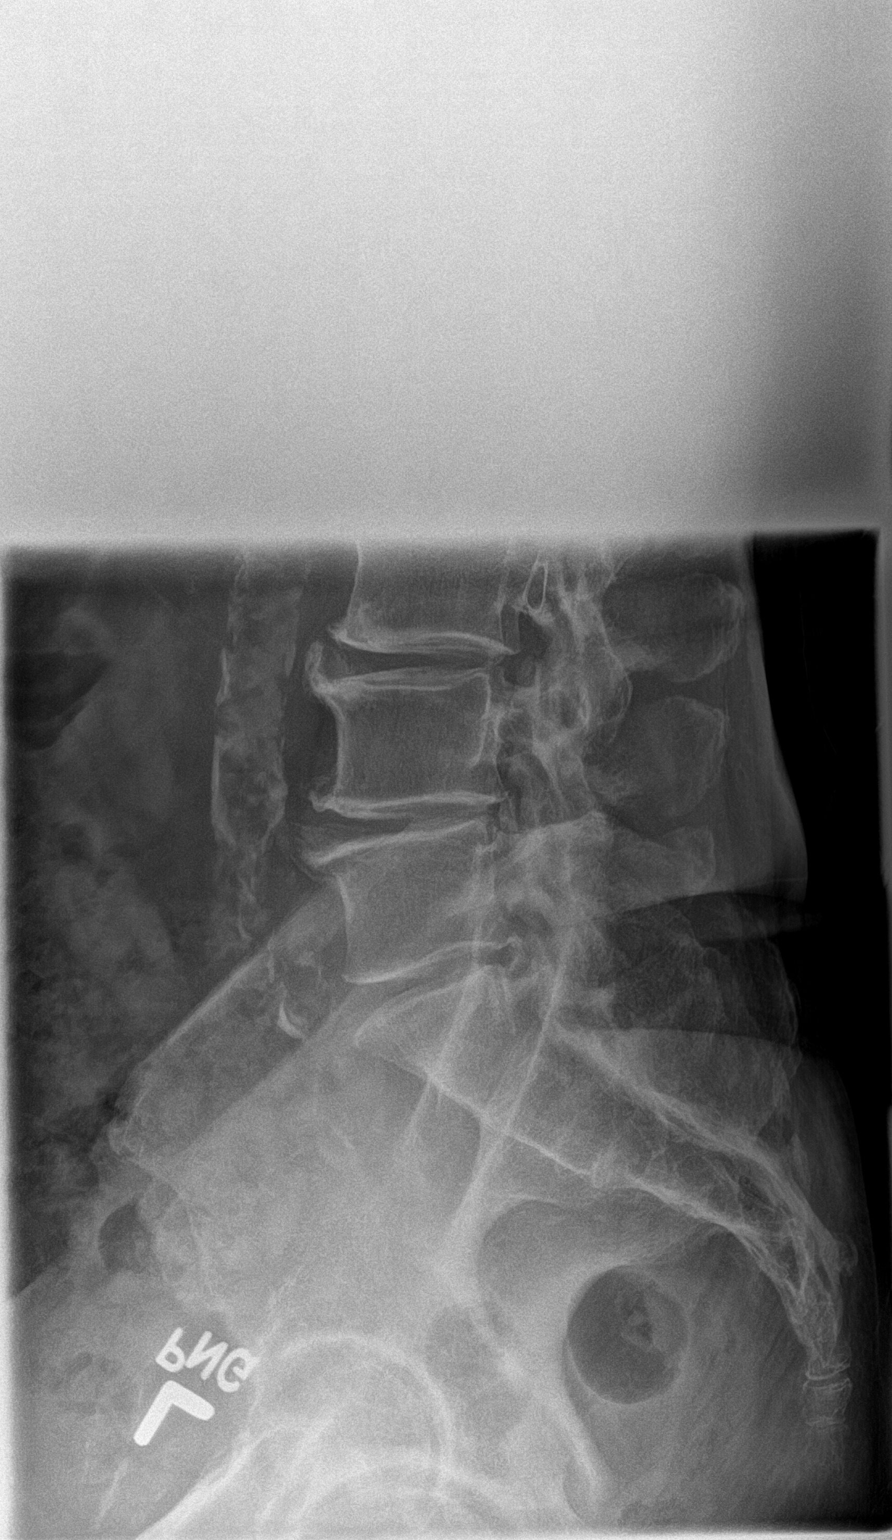

[3 of 3 positions shown; findings below may reference images not displayed]

FINDINGS: Trace retrolisthesis of L5 on S1, L4 on L5, and L3 on L4. Minimal
levo scoliotic curvature of the lumbar spine. Diffuse degenerative
disc disease with disc space narrowing and endplate spurring, most
prominent at L4-L5. There is multilevel facet hypertrophy. The
vertebral body heights are preserved. No evidence of fracture or
focal bone lesion. Aortic atherosclerosis.
IMPRESSION: 1. Multilevel degenerative disc disease and facet hypertrophy, most
prominent at L4-L5.
2. Trace retrolisthesis of L3 on L4, L4 on L5, and L5 on S1.

## 2022-07-04 DIAGNOSIS — B351 Tinea unguium: Secondary | ICD-10-CM | POA: Diagnosis not present

## 2022-07-04 DIAGNOSIS — E1142 Type 2 diabetes mellitus with diabetic polyneuropathy: Secondary | ICD-10-CM | POA: Diagnosis not present

## 2022-07-16 ENCOUNTER — Ambulatory Visit: Payer: Medicare Other | Admitting: Dermatology

## 2022-08-08 ENCOUNTER — Ambulatory Visit: Payer: Medicare Other | Admitting: Dermatology

## 2022-08-08 ENCOUNTER — Encounter: Payer: Self-pay | Admitting: Dermatology

## 2022-08-08 VITALS — BP 104/66

## 2022-08-08 DIAGNOSIS — L300 Nummular dermatitis: Secondary | ICD-10-CM

## 2022-08-08 DIAGNOSIS — L821 Other seborrheic keratosis: Secondary | ICD-10-CM | POA: Diagnosis not present

## 2022-08-08 MED ORDER — TACROLIMUS 0.1 % EX OINT: TOPICAL_OINTMENT | Freq: Two times a day (BID) | CUTANEOUS | 2 refills | Status: DC

## 2022-08-08 NOTE — Patient Instructions (Addendum)
Treatment Plan: Tacrolimus ointment 2 x daily for 2 weeks then stop. Then start Triamcinolone ointment 2 x daily for 2 weeks then stop.     Due to recent changes in healthcare laws, you may see results of your pathology and/or laboratory studies on MyChart before the doctors have had a chance to review them. We understand that in some cases there may be results that are confusing or concerning to you. Please understand that not all results are received at the same time and often the doctors may need to interpret multiple results in order to provide you with the best plan of care or course of treatment. Therefore, we ask that you please give [place] 2 business days to thoroughly review all your results before contacting the office for clarification. Should we see a critical lab result, you will be contacted sooner.   If You Need Anything After Your Visit  If you have any questions or concerns for your doctor, please call our main line at [phone] If no one answers, please leave a voicemail as directed and we will return your call as soon as possible. Messages left after 4 pm will be answered the following business day.   You may also send [place] a message via MyChart. We typically respond to MyChart messages within 1-2 business days.  For prescription refills, please ask your pharmacy to contact our office. Our fax number is [phone].  If you have an urgent issue when the clinic is closed that cannot wait until the next business day, you can page your doctor at the number below.    Please note that while we do our best to be available for urgent issues outside of office hours, we are not available 24/7.   If you have an urgent issue and are unable to reach [place], you may choose to seek medical care at your doctor's office, retail clinic, urgent care center, or emergency room.  If you have a medical emergency, please immediately call 911 or go to the emergency department. In the event of  inclement weather, please call our main line at [phone] for an update on the status of any delays or closures.  Dermatology Medication Tips: Please keep the boxes that topical medications come in in order to help keep track of the instructions about where and how to use these. Pharmacies typically print the medication instructions only on the boxes and not directly on the medication tubes.   If your medication is too expensive, please contact our office at [phone] or send [place] a message through MyChart.   We are unable to tell what your co-pay for medications will be in advance as this is different depending on your insurance coverage. However, we may be able to find a substitute medication at lower cost or fill out paperwork to get insurance to cover a needed medication.   If a prior authorization is required to get your medication covered by your insurance company, please allow [place] 1-2 business days to complete this process.  Drug prices often vary depending on where the prescription is filled and some pharmacies may offer cheaper prices.  The website www.goodrx.com contains coupons for medications through different pharmacies. The prices here do not account for what the cost may be with help from insurance (it may be cheaper with your insurance), but the website can give you the price if you did not use any insurance.  - You can print the associated coupon and take it with your  prescription to the pharmacy.  - You may also stop by our office during regular business hours and pick up a GoodRx coupon card.  - If you need your prescription sent electronically to a different pharmacy, notify our office through [facility] or by phone at [phone]

## 2022-08-08 NOTE — Progress Notes (Signed)
   New Patient Visit   Subjective  [name] is a 82 y.o. male who presents for the following: Rash on the legs and back x 6 months. It was a 3 on an itch scale. It can get to a 5-6. It is a 1-2 on itch scale since using Triamcinolone prescribed by another Dermatologist. He has been using it on and off since January and daily for he past 3 weeks.  He has a brown spot at the right upper forehead x 6 months. 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  The following portions of the chart were reviewed this encounter and updated as appropriate: medications, allergies, medical history  Review of Systems:  No other skin or systemic complaints except as noted in HPI or Assessment and Plan.  Objective  Well appearing patient in no apparent distress; mood and affect are within normal limits.   A focused examination was performed of the following areas: Arms, legs, back, face  Relevant exam findings are noted in the Assessment and Plan.    Assessment & Plan   SEBORRHEIC KERATOSIS - Stuck-on, waxy, tan-brown papules and/or plaques  - Benign-appearing - Discussed benign etiology and prognosis. - Observe - Call for any changes   NUMMULAR ECZEMA Exam: Scaly pink papules coalescing to plaques on legs  Recommend gentle skin care.  Plan: Counseling I counseled the patient regarding the following: Skin care: Patient should bathe using lukewarm water with a mild cleanser and moisturize immediately after. Emollients should be applied at least 2-3 times daily. Avoid scented detergents or fabric softeners. Keep fingernails short. Expectations: The patient is aware that eczema is chronic in nature and can improve with moisturizers and topical steroids and worsen with stress, scented soaps, detergents, scratching, dry skin, changes in weather and skin infections. Contact office if: Eczema worsens or fails to improve despite several weeks of treatment; patient develops skin infections (such as: yellow  honey colored crusts or cold sores).  I recommended the following: Moisturizers   The following medication counseling was provided: I discussed with the patient that prolonged use of topical steroids can result in the increased appearance of superficial blood vessels (telangiectasias), lightening (hypopigmentation) and thinning of the skin (atrophy). The patient verbalized understanding of the proper use and possible adverse effects of topical steroids. All of the patient's questions and concerns were addressed.   Treatment Plan: Tacrolimus ointment 2 x daily for 2 weeks then stop. Then start Triamcinolone ointment 2 x daily for 2 weeks then stop. Keep alternating until next appointment in 2 months      Return in about 2 months (around 10/08/2022) for Atopic Dermatitis Follow Up.  Jaclynn Guarneri, CMA, am acting as scribe for [employer], DO.   Documentation: I have reviewed the above documentation for accuracy and completeness, and I agree with the above.  Langston Reusing, DO

## 2022-09-03 ENCOUNTER — Other Ambulatory Visit: Payer: Self-pay | Admitting: Pulmonary Disease

## 2022-09-03 DIAGNOSIS — J4489 Other specified chronic obstructive pulmonary disease: Secondary | ICD-10-CM

## 2022-09-06 DIAGNOSIS — I10 Essential (primary) hypertension: Secondary | ICD-10-CM | POA: Diagnosis not present

## 2022-09-07 LAB — LAB REPORT - SCANNED: EGFR: 77

## 2022-09-10 DIAGNOSIS — Z8719 Personal history of other diseases of the digestive system: Secondary | ICD-10-CM | POA: Diagnosis not present

## 2022-09-10 DIAGNOSIS — D692 Other nonthrombocytopenic purpura: Secondary | ICD-10-CM | POA: Diagnosis not present

## 2022-09-10 DIAGNOSIS — J439 Emphysema, unspecified: Secondary | ICD-10-CM | POA: Diagnosis not present

## 2022-09-10 DIAGNOSIS — Z7982 Long term (current) use of aspirin: Secondary | ICD-10-CM | POA: Diagnosis not present

## 2022-09-10 DIAGNOSIS — Z0000 Encounter for general adult medical examination without abnormal findings: Secondary | ICD-10-CM | POA: Diagnosis not present

## 2022-09-10 DIAGNOSIS — E118 Type 2 diabetes mellitus with unspecified complications: Secondary | ICD-10-CM | POA: Diagnosis not present

## 2022-09-10 DIAGNOSIS — I2510 Atherosclerotic heart disease of native coronary artery without angina pectoris: Secondary | ICD-10-CM | POA: Diagnosis not present

## 2022-09-10 DIAGNOSIS — I10 Essential (primary) hypertension: Secondary | ICD-10-CM | POA: Diagnosis not present

## 2022-09-10 DIAGNOSIS — J301 Allergic rhinitis due to pollen: Secondary | ICD-10-CM | POA: Diagnosis not present

## 2022-09-10 DIAGNOSIS — I480 Paroxysmal atrial fibrillation: Secondary | ICD-10-CM | POA: Diagnosis not present

## 2022-10-09 ENCOUNTER — Encounter: Payer: Self-pay | Admitting: Dermatology

## 2022-10-09 ENCOUNTER — Ambulatory Visit (INDEPENDENT_AMBULATORY_CARE_PROVIDER_SITE_OTHER): Payer: Medicare Other | Admitting: Dermatology

## 2022-10-09 VITALS — BP 96/60

## 2022-10-09 DIAGNOSIS — L570 Actinic keratosis: Secondary | ICD-10-CM | POA: Diagnosis not present

## 2022-10-09 DIAGNOSIS — L300 Nummular dermatitis: Secondary | ICD-10-CM

## 2022-10-09 DIAGNOSIS — L821 Other seborrheic keratosis: Secondary | ICD-10-CM

## 2022-10-09 MED ORDER — TACROLIMUS 0.1 % EX OINT: TOPICAL_OINTMENT | Freq: Two times a day (BID) | CUTANEOUS | 2 refills | Status: DC

## 2022-10-09 NOTE — Patient Instructions (Addendum)
Hi [name],  Thank you for visiting my office today. I appreciate your dedication to managing your health and adhering to the treatment plan. Here is a summary of the key instructions from today's consultation:  - Medications and Usage:   - Continue using Triamcinolone and Tacrolimus creams as instructed. Alternate between these two, using each for one week at a time.   - Apply Eucerin lotion daily over the entire body to hydrate the skin and prevent spread. Apply this before the medication.   - A new prescription for Tacrolimus will be sent to your pharmacy. Please ensure to pick it up.  - Treatment for Actinic Keratosis Spot:   - The new spot on your nose will be treated with liquid nitrogen to aggressively freeze the area. This is to address the potential pre-cancerous nature of the lesion.   - Keep the area moisturized with Aquaphor following the treatment to manage irritation.  - Follow-Up:   - We will re-evaluate the treated spot on your nose in three months. If it has not resolved, a biopsy will be performed to further assess the condition.  - General Skin Care:   - Apply a thin layer of medication to ensure it soaks in properly before applying Eucerin lotion. Avoid applying the medication too thickly.  Please follow these instructions closely and do not hesitate to contact my office if you have any questions or concerns. We look forward to seeing you again in three months for a follow-up.     Cryotherapy Aftercare  Wash gently with soap and water everyday.   Apply Vaseline and Band-Aid daily until healed.    Due to recent changes in healthcare laws, you may see results of your pathology and/or laboratory studies on MyChart before the doctors have had a chance to review them. We understand that in some cases there may be results that are confusing or concerning to you. Please understand that not all results are received at the same time and often the doctors may need to interpret  multiple results in order to provide you with the best plan of care or course of treatment. Therefore, we ask that you please give [place] 2 business days to thoroughly review all your results before contacting the office for clarification. Should we see a critical lab result, you will be contacted sooner.   If You Need Anything After Your Visit  If you have any questions or concerns for your doctor, please call our main line at [phone] If no one answers, please leave a voicemail as directed and we will return your call as soon as possible. Messages left after 4 pm will be answered the following business day.   You may also send [place] a message via MyChart. We typically respond to MyChart messages within 1-2 business days.  For prescription refills, please ask your pharmacy to contact our office. Our fax number is [phone].  If you have an urgent issue when the clinic is closed that cannot wait until the next business day, you can page your doctor at the number below.    Please note that while we do our best to be available for urgent issues outside of office hours, we are not available 24/7.   If you have an urgent issue and are unable to reach [place], you may choose to seek medical care at your doctor's office, retail clinic, urgent care center, or emergency room.  If you have a medical emergency, please immediately call 911 or go to  the emergency department. In the event of inclement weather, please call our main line at [phone] for an update on the status of any delays or closures.  Dermatology Medication Tips: Please keep the boxes that topical medications come in in order to help keep track of the instructions about where and how to use these. Pharmacies typically print the medication instructions only on the boxes and not directly on the medication tubes.   If your medication is too expensive, please contact our office at [phone] or send [place] a message through MyChart.   We are  unable to tell what your co-pay for medications will be in advance as this is different depending on your insurance coverage. However, we may be able to find a substitute medication at lower cost or fill out paperwork to get insurance to cover a needed medication.   If a prior authorization is required to get your medication covered by your insurance company, please allow [place] 1-2 business days to complete this process.  Drug prices often vary depending on where the prescription is filled and some pharmacies may offer cheaper prices.  The website www.goodrx.com contains coupons for medications through different pharmacies. The prices here do not account for what the cost may be with help from insurance (it may be cheaper with your insurance), but the website can give you the price if you did not use any insurance.  - You can print the associated coupon and take it with your prescription to the pharmacy.  - You may also stop by our office during regular business hours and pick up a GoodRx coupon card.  - If you need your prescription sent electronically to a different pharmacy, notify our office through [facility] or by phone at [phone]

## 2022-10-09 NOTE — Progress Notes (Signed)
   Follow Up Visit   Subjective  [name] is a 82 y.o. male who presents for the following: Follow up on Nummular Eczema at the lower legs. Treatment is with Triamcinolone ointment and OTC Eucerin Advanced [employer].  He did not get Tacrolimus ointment. Itch has improved to a 2 on itch scale.   The following portions of the chart were reviewed this encounter and updated as appropriate: medications, allergies, medical history  Review of Systems:  No other skin or systemic complaints except as noted in HPI or Assessment and Plan.  Objective  Well appearing patient in no apparent distress; mood and affect are within normal limits.          A focused examination was performed of the following areas: Arms, legs, face Relevant exam findings are noted in the Assessment and Plan.  Mid Tip of Nose Erythematous thin papules/macules with gritty scale.   SEBORRHEIC KERATOSIS on forehead - Stuck-on, waxy, tan-brown papules and/or plaques  - Benign-appearing - Discussed benign etiology and prognosis. - Observe - Call for any changes    `   NUMMULAR ECZEMA-stable Exam: Scaly pink papules coalescing to plaques on legs   Treatment Plan: Continue Tacrolimus ointment 2 x daily for 2 weeks then stop. Then start Triamcinolone ointment 2 x daily for 2 weeks then stop.  Eucerin lotion is for whole body moisturizer  Actinic keratosis Mid Tip of Nose  Destruction of lesion - Mid Tip of Nose Complexity: simple   Destruction method: cryotherapy   Informed consent: discussed and consent obtained   Timeout:  patient name, date of birth, surgical site, and procedure verified Lesion destroyed using liquid nitrogen: Yes   Post-procedure details: wound care instructions given      Return in about 2 months (around 12/10/2022) for Nummular Eczema.  Jaclynn Guarneri, CMA, am acting as scribe for [employer], DO.   Documentation: I have reviewed the above documentation for accuracy  and completeness, and I agree with the above.  Langston Reusing, DO

## 2022-10-17 DIAGNOSIS — E118 Type 2 diabetes mellitus with unspecified complications: Secondary | ICD-10-CM | POA: Diagnosis not present

## 2022-10-17 DIAGNOSIS — I2510 Atherosclerotic heart disease of native coronary artery without angina pectoris: Secondary | ICD-10-CM | POA: Diagnosis not present

## 2022-10-17 DIAGNOSIS — I10 Essential (primary) hypertension: Secondary | ICD-10-CM | POA: Diagnosis not present

## 2022-10-18 ENCOUNTER — Other Ambulatory Visit: Payer: Self-pay

## 2022-10-18 DIAGNOSIS — L300 Nummular dermatitis: Secondary | ICD-10-CM

## 2022-10-18 MED ORDER — PIMECROLIMUS 1 % EX CREA: TOPICAL_CREAM | Freq: Two times a day (BID) | CUTANEOUS | 2 refills | Status: DC

## 2022-10-18 NOTE — Addendum Note (Signed)
Addended byWaynetta Pean on: 10/18/2022 08:40 AM   Modules accepted: Orders

## 2022-11-07 DIAGNOSIS — I739 Peripheral vascular disease, unspecified: Secondary | ICD-10-CM | POA: Diagnosis not present

## 2022-11-07 DIAGNOSIS — E1142 Type 2 diabetes mellitus with diabetic polyneuropathy: Secondary | ICD-10-CM | POA: Diagnosis not present

## 2022-11-07 DIAGNOSIS — M2012 Hallux valgus (acquired), left foot: Secondary | ICD-10-CM | POA: Diagnosis not present

## 2022-11-07 DIAGNOSIS — M2011 Hallux valgus (acquired), right foot: Secondary | ICD-10-CM | POA: Diagnosis not present

## 2022-11-23 ENCOUNTER — Other Ambulatory Visit: Payer: Self-pay | Admitting: Pulmonary Disease

## 2022-11-23 DIAGNOSIS — J4489 Other specified chronic obstructive pulmonary disease: Secondary | ICD-10-CM

## 2022-11-30 DIAGNOSIS — Z23 Encounter for immunization: Secondary | ICD-10-CM | POA: Diagnosis not present

## 2022-12-03 ENCOUNTER — Ambulatory Visit: Payer: Medicare Other | Admitting: Nurse Practitioner

## 2022-12-03 ENCOUNTER — Encounter: Payer: Self-pay | Admitting: Nurse Practitioner

## 2022-12-03 VITALS — BP 118/64 | HR 68 | Temp 98.0°F | Ht 66.0 in | Wt 142.0 lb

## 2022-12-03 DIAGNOSIS — J302 Other seasonal allergic rhinitis: Secondary | ICD-10-CM

## 2022-12-03 DIAGNOSIS — J4489 Other specified chronic obstructive pulmonary disease: Secondary | ICD-10-CM

## 2022-12-03 NOTE — Progress Notes (Signed)
@Patient  ID: [name], male    DOB: November 24, 1940, 82 y.o.   MRN: [id number]  Chief Complaint  Patient presents with   Follow-up    SOB with walking up hills or stairs persistent.    Referring provider: Merri Brunette, MD  HPI: 82 year old male, former smoker followed for asthma/COPD overlap.  He is a patient Dr. Reginia Naas and last seen in office 06/01/2022.  Past medical history significant for CAD status post CABG x4, hypertension, ischemic cardiomyopathy, DM 2, HLD, environmental allergies.  TEST/EVENTS:  11/28/2015 PFT: FVC 92, FEV1 58, ratio 45, TLC 133, DLCO 45 06/27/2016 PFT: FVC 87, FEV1 65, ratio 54+BD  05/20/2018 CT chest: There is a nodular area of consolidation in the peribronchovascular area and hazy airspace opacities in the right lower lobe, may reflect multifocal bronchopneumonia for cryptogenic organizing pneumonia.  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 08/22/2018 CT chest without contrast: Previously seen density in the right upper lobe has improved, residual compatible with scarring.  06/22/2021: OV with Dr. Vassie Loll.  Seems to be having a mild exacerbation related to pollen exposure.  Cough and congestion is improving.  He has decreased his breath sounds on the left base; CXR ordered for further evaluation which was stable.  Advised that he continue Symbicort and use Mucinex as needed.  Instructed to contact if symptoms worsen for course of prednisone.  Follow-up 6 months.  12/22/2021: OV with Danford Tat NP for intended follow-up however he is also having some acute symptoms.  He first developed a increased productive cough around 2 months ago.  He describes the sputum as gray.  Feels like his cough waxes and wanes.  Could be related to allergies per his report. He doesn't have any significant congestion or drainage; occasionally feels like he has postnasal drip. He has had some recent increase in wheezing and feels slightly more short winded than normal.  Feels like activities around the house  have been a little more difficult for him over the past few weeks to a month.  He denies any fevers, chills, hemoptysis, lower extremity swelling, orthopnea.  No known sick exposures.  Did not feel like he had any URI symptoms that onset his cough.  He is using Symbicort daily.  CXR with mild bibasilar atelectasis. Treated for AECOPD/asthma with azithromycin and prednisone.   01/19/2022: Ov with Isaiah Cianci NP for follow up after being treated for AECOPD/asthma. He completed prednisone and antibiotics.  Feels much better today.  Symptoms feel like they are back to his baseline.  Does still have an occasional cough but overall, much improved. Minimal production; occasional clear sputum. Doesn't feel like he's had much wheezing. No fevers, chills, hemoptysis, orthopnea, leg swelling. He is using symbicort twice daily, takes OTC antihistamine and flonase.   05/18/2022: OV with Iman Orourke NP for acute visit. On Sunday, he started having nasal congestion with clear drainage, some pressure in his face, and occasional cough. He did take an at home COVID test which was negative. His sinus symptoms and cough have started to improve some. Still getting up a small amount of clear phlegm at times. Now having increased shortness of breath and noticing more wheezing. He uses his rescue inhaler, which helps. He denies any fevers, chills, hemoptysis, leg swelling, orthopnea. Eating and drinking well. Using Symbicort twice a day. Taking flonase and zyrtec.  06/01/2022: OV with Dr. Vassie Loll. Treated with prednisone and benzonatate for acute symptoms 3/1. SOB is improved. Still has a minimal cough with clear phlegm, 2-3 times a  day. Compliant with Symbicort. Advised flonase and OTC antihistamine for sinus symptoms. Refilled benzonatate. Ok to use delsym for cough. Consider escalating to triple therapy. Pt prefers to remain on Symbicort.  12/03/2022: Today - follow up Patient presents today for follow up. He has been doing well over the last 6  months. He has not had any flare ups requiring steroids or abx. No hospitalizations. He feels like his breathing is fine. He does get short winded with more strenuous activities or uphill climbing but otherwise, can complete activities without difficulties. He has a rare cough with clear phlegm that is not bothersome. Sinus symptoms have been stable. No wheezing, chest congestion. Rarely uses his rescue; </1 time a month.   Allergies  Allergen Reactions   Lisinopril Other (See Comments)    Immunization History  Administered Date(s) Administered   Influenza, High Dose Seasonal PF 11/28/2015, 01/03/2016, 12/17/2016, 11/30/2017, 12/04/2019   Influenza, Quadrivalent, Recombinant, Inj, Pf 12/06/2017, 12/19/2018, 12/16/2020   Influenza,inj,quad, With Preservative 12/17/2017   Influenza-Unspecified 01/11/2011, 01/11/2012   Moderna Covid-19 Vaccine Bivalent Booster 19yrs & up 12/20/2020   Moderna SARS-COV2 Booster Vaccination 01/12/2020   Pneumococcal Conjugate-13 07/24/2011, 06/23/2015, 03/22/2016, 03/08/2017   Pneumococcal Polysaccharide-23 07/07/2009   Td 07/24/2011   Zoster Recombinant(Shingrix) 12/07/2016, 02/16/2017, 03/08/2017    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   02/03/2016-respiratory allergy panel, environmental allergens to dust mites, cat dander, dog dander, Timothy grass, cockroach, fungus, cedar trees, ragweed, rough pigweed, mouse urine, IgE is 689 >>>Largest elevations are to cat  dander, dog dander, dust mites  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)  06/27/2016-pulmonary function test-FVC 3.03 (87% predicted), postbronchodilator ratio 54, postbronc   Basal cell carcinoma 08/20/2016   left cheek-cx3 excision   BCC (basal cell carcinoma) 09/20/2016   left cheek-mohs   CAD (coronary artery disease)    cath & CABG in 2008   Cardiomyopathy, 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   COPD (chronic obstructive pulmonary disease) (HCC)    COPD with acute exacerbation (HCC) 04/02/2018   Diabetes (HCC)    type 2   DM2 (diabetes mellitus, type 2) (HCC) 02/05/2013   Dyslipidemia    Environmental allergies 02/03/2016   02/03/2016-respiratory allergy panel, environmental allergens to dust mites, cat dander, dog dander, Timothy grass, cockroach, fungus, cedar trees, ragweed, rough pigweed, mouse urine, IgE is 689 >>>Largest elevations are to cat dander, dog dander, dust mites    Essential hypertension 02/03/2016   Former smoker 05/14/2018   Former smoker Quit 2008 43-pack-year smoking history   History of nuclear stress test 10/08/2007   bruce myoview; normal scan with attenuation artifact; no ischemia/infarct; low risk    Hypertriglyceridemia 02/03/2016   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 Maren Beach - [place] to LAD, sequential saphenous vein graft to OM1 and OM 2, SVG to posterior descending (done emergently)     Tobacco History: Social History   Tobacco Use  Smoking Status Former  Current packs/day: 0.00   Average packs/day: 1 pack/day for 43.1 years (43.1 ttl pk-yrs)   Types: Cigarettes   Start date: 12/25/1963   Quit date: 01/31/2007   Years since quitting: 15.8  Smokeless Tobacco Never   Counseling given: Not Answered   Outpatient Medications Prior to Visit  Medication Sig Dispense Refill   albuterol (VENTOLIN HFA) 108 (90 Base) MCG/ACT inhaler  Inhale 2 puffs into the lungs every 6 (six) hours as needed for wheezing or shortness of breath. 8 g 2   aspirin 81 MG chewable tablet Chew 81 mg by mouth daily.     Biotin 16109 MCG TABS Take 1 tablet by mouth daily.     Cholecalciferol (VITAMIN D3) 25 MCG (1000 UT) CAPS Take 1 capsule by mouth daily.     Cyanocobalamin (VITAMIN B-12 SL) Place 1 tablet under the tongue daily.     diltiazem (CARDIZEM CD) 180 MG 24 hr capsule TAKE 1 CAPSULE(180 MG) BY MOUTH DAILY 90 capsule 2   fluticasone (FLONASE) 50 MCG/ACT nasal spray Place 1 spray into both nostrils as needed for allergies or rhinitis.     folic acid (FOLVITE) 1 MG tablet Take 1 mg by mouth daily.     losartan (COZAAR) 25 MG tablet Take 25 mg by mouth daily. as directed  1   Omega-3 Fatty Acids (FISH OIL PO) Take 1 capsule by mouth at bedtime.     pimecrolimus (ELIDEL) 1 % cream Apply topically 2 (two) times daily. 100 g 2   pravastatin (PRAVACHOL) 40 MG tablet Take 1 tablet (40 mg total) by mouth daily. 90 tablet 3   SYMBICORT 80-4.5 MCG/ACT inhaler INHALE 2 PUFFS BY MOUTH IN THE MORNING AND 2 PUFFS AT BEDTIME 10.2 g 6   benzonatate (TESSALON) 100 MG capsule Take 1 capsule (100 mg total) by mouth every 6 (six) hours as needed for cough. (Patient not taking: Reported on 12/03/2022) 30 capsule 1   dapagliflozin propanediol (FARXIGA) 5 MG TABS tablet Take by mouth daily. (Patient not taking: Reported on 12/03/2022)     nitroGLYCERIN (NITROSTAT) 0.4 MG SL tablet Place 0.4 mg under the tongue every 5 (five) minutes x 3 doses as needed. (Patient not taking: Reported on 12/03/2022)     No facility-administered medications prior to visit.     Review of Systems:   Constitutional: No weight loss or gain, night sweats, fevers, chills, fatigue, or lassitude. HEENT: No headaches, difficulty swallowing, tooth/dental problems, or sore throat. No sneezing, itching, ear ache, nasal congestion, post nasal drip CV:  No chest pain, orthopnea, PND, swelling in  lower extremities, anasarca, dizziness, palpitations, syncope Resp: +shortness of breath with exertion (baseline); minimal cough. No hemoptysis. No wheezing.  No chest wall deformity GI:  No heartburn, indigestion, abdominal pain, nausea, vomiting, diarrhea, change in bowel habits, loss of appetite, bloody stools.  Skin: No rashes, lesions, ulcerations Neuro: No dizziness or lightheadedness.  Psych: No depression or anxiety. Mood stable.     Physical Exam:  BP 118/64 (BP Location: Right Arm, Patient Position: Sitting, Cuff Size: Normal)   Pulse 68   Temp 98 F (36.7 C) (Oral)   Ht 5\' 6"  (1.676 m)   Wt 142 lb (64.4 kg)   SpO2 96%   BMI 22.92 kg/m   GEN: Pleasant, interactive, well-appearing; elderly; in no acute distress. HEENT:  Normocephalic and atraumatic. PERRLA. Sclera white. Nasal turbinates pink, moist and patent bilaterally. No rhinorrhea present. Oropharynx pink and moist, without exudate or edema. No lesions, ulcerations  NECK:  Supple w/ fair ROM. No JVD present. No lymphadenopathy.   CV: RRR, no m/r/g, no peripheral edema. Pulses intact, +2 bilaterally. No cyanosis, pallor or clubbing. PULMONARY:  Unlabored, regular breathing. Clear bilaterally A&P w/o wheezes/rales/rhonchi. No accessory muscle use.  GI: BS present and normoactive. Soft, non-tender to palpation. No organomegaly or masses detected.  MSK: No erythema, warmth or tenderness. Cap refil <2 sec all extrem. No deformities or joint swelling noted.  Neuro: A/Ox3. No focal deficits noted.   Skin: Warm, no lesions or rashe Psych: Normal affect and behavior. Judgement and thought content appropriate.     Lab Results:  CBC    Component Value Date/Time   WBC 8.0 07/10/2008 0855   RBC 4.42 07/10/2008 0855   HGB 14.6 09/16/2015 1749   HCT 43.0 09/16/2015 1749   PLT 299 07/10/2008 0855   MCV 89.0 07/10/2008 0855   MCHC 34.2 07/10/2008 0855   RDW 12.7 07/10/2008 0855   LYMPHSABS 2.0 07/04/2008 0358   MONOABS  0.7 07/04/2008 0358   EOSABS 0.3 07/04/2008 0358   BASOSABS 0.0 07/04/2008 0358    BMET    Component Value Date/Time   NA 140 12/28/2016 1014   K 4.6 12/28/2016 1014   CL 105 12/28/2016 1014   CO2 28 12/28/2016 1014   GLUCOSE 127 (H) 12/28/2016 1014   BUN 16 12/28/2016 1014   CREATININE 0.75 12/28/2016 1014   CALCIUM 8.9 12/28/2016 1014   GFRNONAA >60 07/10/2008 0855   GFRAA  07/10/2008 0855    >60        The eGFR has been calculated using the MDRD equation. This calculation has not been validated in all clinical situations. eGFR's persistently <60 mL/min signify possible Chronic Kidney Disease.    BNP No results found for: "BNP"   Imaging:  No results found.  Administration History     None          Latest Ref Rng & Units 06/27/2016    3:09 PM 03/16/2016    9:48 AM 11/28/2015    2:42 PM  PFT Results  FVC-Pre L 3.03  2.74  3.23   FVC-Predicted Pre % 87  78  92   FVC-Post L 3.47  3.27    FVC-Predicted Post % 99  93    Pre FEV1/FVC % % 53  54  45   Post FEV1/FCV % % 54  53    FEV1-Pre L 1.61  1.49  1.47   FEV1-Predicted Pre % 65  60  58   FEV1-Post L 1.86  1.74    DLCO uncorrected ml/min/mmHg   12.22   DLCO UNC% %   45   DLVA Predicted %   53   TLC L   8.31   TLC % Predicted %   133   RV % Predicted %   203     No results found for: "NITRICOXIDE"      Assessment & Plan:   Asthma-COPD overlap syndrome (HCC) Compensated on current regimen. He has done quite well over the last 6 months. No changes made today. Advised on trigger prevention. Encouraged to remain active. Action plan in place.  Patient Instructions  Continue Symbicort 2 puffs Twice daily. Brush tongue and rinse mouth afterwards Continue Albuterol inhaler 2 puffs every 6 hours as needed for shortness of breath or wheezing. Notify if symptoms persist despite rescue inhaler/neb use.  Continue Flonase nasal spray 2 sprays each nostril daily Continue Over the counter antihistamine  such  as zyrtec, xyzal, claritin or allegra   Follow up in 6 months with Dr. Vassie Loll or Katie Tracen Mahler,NP. If symptoms do not improve or worsen, please contact office for sooner follow up or seek emergency care.   Allergic rhinitis Stable. Compensated on current regimen.      I spent 25 minutes of dedicated to the care of this patient on the date of this encounter to include pre-visit review of records, face-to-face time with the patient discussing conditions above, post visit ordering of testing, clinical documentation with the electronic health record, making appropriate referrals as documented, and communicating necessary findings to members of the patients care team.  Noemi Chapel, NP 12/03/2022  Pt aware and understands NP's role.

## 2022-12-03 NOTE — Assessment & Plan Note (Signed)
Compensated on current regimen. He has done quite well over the last 6 months. No changes made today. Advised on trigger prevention. Encouraged to remain active. Action plan in place.  Patient Instructions  Continue Symbicort 2 puffs Twice daily. Brush tongue and rinse mouth afterwards Continue Albuterol inhaler 2 puffs every 6 hours as needed for shortness of breath or wheezing. Notify if symptoms persist despite rescue inhaler/neb use.  Continue Flonase nasal spray 2 sprays each nostril daily Continue Over the counter antihistamine such as zyrtec, xyzal, claritin or allegra   Follow up in 6 months with Dr. Vassie Loll or Katie Kennedey Digilio,NP. If symptoms do not improve or worsen, please contact office for sooner follow up or seek emergency care.

## 2022-12-03 NOTE — Assessment & Plan Note (Signed)
Stable. Compensated on current regimen.

## 2022-12-03 NOTE — Patient Instructions (Signed)
Continue Symbicort 2 puffs Twice daily. Brush tongue and rinse mouth afterwards Continue Albuterol inhaler 2 puffs every 6 hours as needed for shortness of breath or wheezing. Notify if symptoms persist despite rescue inhaler/neb use.  Continue Flonase nasal spray 2 sprays each nostril daily Continue Over the counter antihistamine such as zyrtec, xyzal, claritin or allegra   Follow up in 6 months with Dr. Vassie Loll or Katie Habeeb Puertas,NP. If symptoms do not improve or worsen, please contact office for sooner follow up or seek emergency care.

## 2022-12-10 ENCOUNTER — Encounter: Payer: Self-pay | Admitting: Dermatology

## 2022-12-10 ENCOUNTER — Ambulatory Visit (INDEPENDENT_AMBULATORY_CARE_PROVIDER_SITE_OTHER): Payer: Medicare Other | Admitting: Dermatology

## 2022-12-10 DIAGNOSIS — L300 Nummular dermatitis: Secondary | ICD-10-CM

## 2022-12-10 NOTE — Progress Notes (Unsigned)
   Follow-Up Visit   Subjective  [name] is a 82 y.o. male who presents for the following: Nummular dermatitis follow up of legs. Tacrolimus was too expensive. He is using Tmc 0.1% cream daily alternating weekly with Eucerin cream. On a scale of 1-10, itching is about a 2.   The following portions of the chart were reviewed this encounter and updated as appropriate: medications, allergies, medical history  Review of Systems:  No other skin or systemic complaints except as noted in HPI or Assessment and Plan.  Objective  Well appearing patient in no apparent distress; mood and affect are within normal limits.   A focused examination was performed of the following areas: face, legs  Relevant exam findings are noted in the Assessment and Plan.    Assessment & Plan   Nummular Dermatitis (Improved) Exam: Pink patch of left lower leg.  Treatment Plan: Continue TMC 0.1% cream daily only to affected areas x 2 weeks. Continue Eucerin lotion daily to legs.   He may get Tacrolimus after the first of the year when his insurance resets.     Return in about 6 months (around 06/09/2023) for Nummular derm follow up.    Documentation: I have reviewed the above documentation for accuracy and completeness, and I agree with the above.  Langston Reusing, DO

## 2022-12-10 NOTE — Patient Instructions (Signed)
Important Information  Due to recent changes in healthcare laws, you may see results of your pathology and/or laboratory studies on MyChart before the doctors have had a chance to review them. We understand that in some cases there may be results that are confusing or concerning to you. Please understand that not all results are received at the same time and often the doctors may need to interpret multiple results in order to provide you with the best plan of care or course of treatment. Therefore, we ask that you please give [place] 2 business days to thoroughly review all your results before contacting the office for clarification. Should we see a critical lab result, you will be contacted sooner.   If You Need Anything After Your Visit  If you have any questions or concerns for your doctor, please call our main line at [phone] If no one answers, please leave a voicemail as directed and we will return your call as soon as possible. Messages left after 4 pm will be answered the following business day.   You may also send [place] a message via MyChart. We typically respond to MyChart messages within 1-2 business days.  For prescription refills, please ask your pharmacy to contact our office. Our fax number is [phone].  If you have an urgent issue when the clinic is closed that cannot wait until the next business day, you can page your doctor at the number below.    Please note that while we do our best to be available for urgent issues outside of office hours, we are not available 24/7.   If you have an urgent issue and are unable to reach [place], you may choose to seek medical care at your doctor's office, retail clinic, urgent care center, or emergency room.  If you have a medical emergency, please immediately call 911 or go to the emergency department. In the event of inclement weather, please call our main line at [phone] for an update on the status of any delays or  closures.  Dermatology Medication Tips: Please keep the boxes that topical medications come in in order to help keep track of the instructions about where and how to use these. Pharmacies typically print the medication instructions only on the boxes and not directly on the medication tubes.   If your medication is too expensive, please contact our office at [phone] or send [place] a message through MyChart.   We are unable to tell what your co-pay for medications will be in advance as this is different depending on your insurance coverage. However, we may be able to find a substitute medication at lower cost or fill out paperwork to get insurance to cover a needed medication.   If a prior authorization is required to get your medication covered by your insurance company, please allow [place] 1-2 business days to complete this process.  Drug prices often vary depending on where the prescription is filled and some pharmacies may offer cheaper prices.  The website www.goodrx.com contains coupons for medications through different pharmacies. The prices here do not account for what the cost may be with help from insurance (it may be cheaper with your insurance), but the website can give you the price if you did not use any insurance.  - You can print the associated coupon and take it with your prescription to the pharmacy.  - You may also stop by our office during regular business hours and pick up a GoodRx coupon card.  - If  you need your prescription sent electronically to a different pharmacy, notify our office through [facility] or by phone at [phone]

## 2023-01-10 DIAGNOSIS — Z23 Encounter for immunization: Secondary | ICD-10-CM | POA: Diagnosis not present

## 2023-01-16 DIAGNOSIS — E118 Type 2 diabetes mellitus with unspecified complications: Secondary | ICD-10-CM | POA: Diagnosis not present

## 2023-01-16 DIAGNOSIS — I10 Essential (primary) hypertension: Secondary | ICD-10-CM | POA: Diagnosis not present

## 2023-01-16 DIAGNOSIS — I2510 Atherosclerotic heart disease of native coronary artery without angina pectoris: Secondary | ICD-10-CM | POA: Diagnosis not present

## 2023-01-28 ENCOUNTER — Other Ambulatory Visit: Payer: Self-pay | Admitting: Cardiology

## 2023-02-01 DIAGNOSIS — B351 Tinea unguium: Secondary | ICD-10-CM | POA: Diagnosis not present

## 2023-02-01 DIAGNOSIS — M2012 Hallux valgus (acquired), left foot: Secondary | ICD-10-CM | POA: Diagnosis not present

## 2023-02-01 DIAGNOSIS — M2011 Hallux valgus (acquired), right foot: Secondary | ICD-10-CM | POA: Diagnosis not present

## 2023-02-18 DIAGNOSIS — H02834 Dermatochalasis of left upper eyelid: Secondary | ICD-10-CM | POA: Diagnosis not present

## 2023-02-18 DIAGNOSIS — E119 Type 2 diabetes mellitus without complications: Secondary | ICD-10-CM | POA: Diagnosis not present

## 2023-02-18 DIAGNOSIS — H26493 Other secondary cataract, bilateral: Secondary | ICD-10-CM | POA: Diagnosis not present

## 2023-02-18 DIAGNOSIS — H02035 Senile entropion of left lower eyelid: Secondary | ICD-10-CM | POA: Diagnosis not present

## 2023-02-18 DIAGNOSIS — H02831 Dermatochalasis of right upper eyelid: Secondary | ICD-10-CM | POA: Diagnosis not present

## 2023-02-18 DIAGNOSIS — H0102A Squamous blepharitis right eye, upper and lower eyelids: Secondary | ICD-10-CM | POA: Diagnosis not present

## 2023-02-18 DIAGNOSIS — Z961 Presence of intraocular lens: Secondary | ICD-10-CM | POA: Diagnosis not present

## 2023-02-18 DIAGNOSIS — H43813 Vitreous degeneration, bilateral: Secondary | ICD-10-CM | POA: Diagnosis not present

## 2023-02-18 DIAGNOSIS — H02032 Senile entropion of right lower eyelid: Secondary | ICD-10-CM | POA: Diagnosis not present

## 2023-02-18 DIAGNOSIS — H0102B Squamous blepharitis left eye, upper and lower eyelids: Secondary | ICD-10-CM | POA: Diagnosis not present

## 2023-03-08 ENCOUNTER — Encounter (HOSPITAL_BASED_OUTPATIENT_CLINIC_OR_DEPARTMENT_OTHER): Payer: Self-pay | Admitting: Emergency Medicine

## 2023-03-08 ENCOUNTER — Observation Stay (HOSPITAL_BASED_OUTPATIENT_CLINIC_OR_DEPARTMENT_OTHER)
Admission: EM | Admit: 2023-03-08 | Discharge: 2023-03-11 | Disposition: A | Discharge status: Home / Self Care | Payer: Medicare Other | Attending: Internal Medicine | Admitting: Internal Medicine

## 2023-03-08 ENCOUNTER — Emergency Department (HOSPITAL_BASED_OUTPATIENT_CLINIC_OR_DEPARTMENT_OTHER): Payer: Medicare Other

## 2023-03-08 ENCOUNTER — Other Ambulatory Visit: Payer: Self-pay

## 2023-03-08 DIAGNOSIS — B974 Respiratory syncytial virus as the cause of diseases classified elsewhere: Secondary | ICD-10-CM | POA: Insufficient documentation

## 2023-03-08 DIAGNOSIS — J121 Respiratory syncytial virus pneumonia: Secondary | ICD-10-CM | POA: Insufficient documentation

## 2023-03-08 DIAGNOSIS — I2510 Atherosclerotic heart disease of native coronary artery without angina pectoris: Secondary | ICD-10-CM | POA: Insufficient documentation

## 2023-03-08 DIAGNOSIS — I480 Paroxysmal atrial fibrillation: Secondary | ICD-10-CM | POA: Diagnosis not present

## 2023-03-08 DIAGNOSIS — Z7984 Long term (current) use of oral hypoglycemic drugs: Secondary | ICD-10-CM | POA: Diagnosis not present

## 2023-03-08 DIAGNOSIS — J441 Chronic obstructive pulmonary disease with (acute) exacerbation: Secondary | ICD-10-CM | POA: Diagnosis not present

## 2023-03-08 DIAGNOSIS — J4521 Mild intermittent asthma with (acute) exacerbation: Secondary | ICD-10-CM | POA: Diagnosis not present

## 2023-03-08 DIAGNOSIS — J210 Acute bronchiolitis due to respiratory syncytial virus: Principal | ICD-10-CM

## 2023-03-08 DIAGNOSIS — Z7901 Long term (current) use of anticoagulants: Secondary | ICD-10-CM | POA: Diagnosis not present

## 2023-03-08 DIAGNOSIS — Z1152 Encounter for screening for COVID-19: Secondary | ICD-10-CM | POA: Diagnosis not present

## 2023-03-08 DIAGNOSIS — J9601 Acute respiratory failure with hypoxia: Principal | ICD-10-CM | POA: Insufficient documentation

## 2023-03-08 DIAGNOSIS — R0989 Other specified symptoms and signs involving the circulatory and respiratory systems: Secondary | ICD-10-CM | POA: Diagnosis not present

## 2023-03-08 DIAGNOSIS — E785 Hyperlipidemia, unspecified: Secondary | ICD-10-CM | POA: Diagnosis not present

## 2023-03-08 DIAGNOSIS — R059 Cough, unspecified: Secondary | ICD-10-CM | POA: Diagnosis not present

## 2023-03-08 DIAGNOSIS — R0602 Shortness of breath: Secondary | ICD-10-CM | POA: Diagnosis not present

## 2023-03-08 DIAGNOSIS — E119 Type 2 diabetes mellitus without complications: Secondary | ICD-10-CM | POA: Insufficient documentation

## 2023-03-08 DIAGNOSIS — Z951 Presence of aortocoronary bypass graft: Secondary | ICD-10-CM

## 2023-03-08 DIAGNOSIS — J9691 Respiratory failure, unspecified with hypoxia: Secondary | ICD-10-CM | POA: Diagnosis present

## 2023-03-08 DIAGNOSIS — I10 Essential (primary) hypertension: Secondary | ICD-10-CM | POA: Diagnosis not present

## 2023-03-08 DIAGNOSIS — Z79899 Other long term (current) drug therapy: Secondary | ICD-10-CM | POA: Diagnosis not present

## 2023-03-08 DIAGNOSIS — I700 Atherosclerosis of aorta: Secondary | ICD-10-CM | POA: Diagnosis not present

## 2023-03-08 DIAGNOSIS — J4489 Other specified chronic obstructive pulmonary disease: Secondary | ICD-10-CM | POA: Diagnosis present

## 2023-03-08 LAB — CBC WITH DIFFERENTIAL/PLATELET
Abs Immature Granulocytes: 0.03 10*3/uL (ref 0.00–0.07)
Basophils Absolute: 0 10*3/uL (ref 0.0–0.1)
Basophils Relative: 0 %
Eosinophils Absolute: 0.2 10*3/uL (ref 0.0–0.5)
Eosinophils Relative: 2 %
HCT: 39.2 % (ref 39.0–52.0)
Hemoglobin: 13.7 g/dL (ref 13.0–17.0)
Immature Granulocytes: 0 %
Lymphocytes Relative: 11 %
Lymphs Abs: 1.1 10*3/uL (ref 0.7–4.0)
MCH: 30.7 pg (ref 26.0–34.0)
MCHC: 34.9 g/dL (ref 30.0–36.0)
MCV: 87.9 fL (ref 80.0–100.0)
Monocytes Absolute: 1 10*3/uL (ref 0.1–1.0)
Monocytes Relative: 10 %
Neutro Abs: 7.8 10*3/uL — ABNORMAL HIGH (ref 1.7–7.7)
Neutrophils Relative %: 77 %
Platelets: 205 10*3/uL (ref 150–400)
RBC: 4.46 MIL/uL (ref 4.22–5.81)
RDW: 12.7 % (ref 11.5–15.5)
WBC: 10 10*3/uL (ref 4.0–10.5)
nRBC: 0 % (ref 0.0–0.2)

## 2023-03-08 LAB — LACTIC ACID, PLASMA
Lactic Acid, Venous: 1.2 mmol/L (ref 0.5–1.9)
Lactic Acid, Venous: 2 mmol/L (ref 0.5–1.9)
Lactic Acid, Venous: 6.1 mmol/L (ref 0.5–1.9)
Lactic Acid, Venous: 4.8 mmol/L (ref 0.5–1.9)

## 2023-03-08 LAB — TROPONIN I (HIGH SENSITIVITY)
Troponin I (High Sensitivity): 27 ng/L — ABNORMAL HIGH (ref <18)
Troponin I (High Sensitivity): 27 ng/L — ABNORMAL HIGH (ref <18)

## 2023-03-08 LAB — BASIC METABOLIC PANEL
Anion gap: 8 (ref 5–15)
BUN: 19 mg/dL (ref 8–23)
CO2: 28 mmol/L (ref 22–32)
Calcium: 9.3 mg/dL (ref 8.9–10.3)
Chloride: 101 mmol/L (ref 98–111)
Creatinine, Ser: 0.8 mg/dL (ref 0.61–1.24)
GFR, Estimated: >60 mL/min (ref >60)
Glucose, Bld: 143 mg/dL — ABNORMAL HIGH (ref 70–99)
Potassium: 4.2 mmol/L (ref 3.5–5.1)
Sodium: 137 mmol/L (ref 135–145)

## 2023-03-08 LAB — RESP PANEL BY RT-PCR (RSV, FLU A&B, COVID)  RVPGX2
Influenza A by PCR: NEGATIVE
Influenza B by PCR: NEGATIVE
Resp Syncytial Virus by PCR: POSITIVE — AB
SARS Coronavirus 2 by RT PCR: NEGATIVE

## 2023-03-08 LAB — MAGNESIUM: Magnesium: 1.9 mg/dL (ref 1.7–2.4)

## 2023-03-08 LAB — BRAIN NATRIURETIC PEPTIDE: B Natriuretic Peptide: 439.5 pg/mL — ABNORMAL HIGH (ref 0.0–100.0)

## 2023-03-08 LAB — GLUCOSE, CAPILLARY: Glucose-Capillary: 379 mg/dL — ABNORMAL HIGH (ref 70–99)

## 2023-03-08 MED ORDER — PREDNISONE 20 MG PO TABS
40.0000 mg | ORAL_TABLET | Freq: Every day | ORAL | Status: DC
  Administered 2023-03-09 – 2023-03-11 (×3): 40 mg via ORAL
  Filled 2023-03-08 (×3): qty 2

## 2023-03-08 MED ORDER — INSULIN ASPART 100 UNIT/ML IJ SOLN
0.0000 [IU] | Freq: Three times a day (TID) | INTRAMUSCULAR | Status: DC
  Administered 2023-03-09: 2 [IU] via SUBCUTANEOUS
  Administered 2023-03-09: 5 [IU] via SUBCUTANEOUS
  Administered 2023-03-09: 2 [IU] via SUBCUTANEOUS
  Administered 2023-03-10: 3 [IU] via SUBCUTANEOUS
  Administered 2023-03-10 – 2023-03-11 (×3): 2 [IU] via SUBCUTANEOUS

## 2023-03-08 MED ORDER — ALBUTEROL SULFATE (2.5 MG/3ML) 0.083% IN NEBU
2.5000 mg | INHALATION_SOLUTION | Freq: Four times a day (QID) | RESPIRATORY_TRACT | Status: DC
  Administered 2023-03-08: 2.5 mg via RESPIRATORY_TRACT
  Filled 2023-03-08: qty 3

## 2023-03-08 MED ORDER — PRAVASTATIN SODIUM 20 MG PO TABS
40.0000 mg | ORAL_TABLET | Freq: Every day | ORAL | Status: DC
  Administered 2023-03-08 – 2023-03-11 (×4): 40 mg via ORAL
  Filled 2023-03-08 (×4): qty 2

## 2023-03-08 MED ORDER — DILTIAZEM HCL ER COATED BEADS 180 MG PO CP24
180.0000 mg | ORAL_CAPSULE | Freq: Every day | ORAL | Status: DC
  Administered 2023-03-08 – 2023-03-11 (×4): 180 mg via ORAL
  Filled 2023-03-08 (×4): qty 1

## 2023-03-08 MED ORDER — ENOXAPARIN SODIUM 40 MG/0.4ML IJ SOSY
40.0000 mg | PREFILLED_SYRINGE | Freq: Every day | INTRAMUSCULAR | Status: DC
  Administered 2023-03-08 – 2023-03-10 (×3): 40 mg via SUBCUTANEOUS
  Filled 2023-03-08 (×3): qty 0.4

## 2023-03-08 MED ORDER — MOMETASONE FURO-FORMOTEROL FUM 100-5 MCG/ACT IN AERO
2.0000 | INHALATION_SPRAY | Freq: Two times a day (BID) | RESPIRATORY_TRACT | Status: DC
Start: 2023-03-08 — End: 2023-03-09
  Administered 2023-03-08 – 2023-03-09 (×2): 2 via RESPIRATORY_TRACT
  Filled 2023-03-08: qty 8.8

## 2023-03-08 MED ORDER — ONDANSETRON HCL 4 MG PO TABS
4.0000 mg | ORAL_TABLET | Freq: Four times a day (QID) | ORAL | Status: DC | PRN
  Filled 2023-03-08: qty 1

## 2023-03-08 MED ORDER — ALBUTEROL SULFATE (2.5 MG/3ML) 0.083% IN NEBU
5.0000 mg | INHALATION_SOLUTION | Freq: Once | RESPIRATORY_TRACT | Status: AC
  Administered 2023-03-08: 5 mg via RESPIRATORY_TRACT
  Filled 2023-03-08: qty 6

## 2023-03-08 MED ORDER — SODIUM CHLORIDE 0.9 % IV BOLUS
1000.0000 mL | Freq: Once | INTRAVENOUS | Status: AC
  Administered 2023-03-08: 1000 mL via INTRAVENOUS

## 2023-03-08 MED ORDER — ACETAMINOPHEN 325 MG PO TABS
650.0000 mg | ORAL_TABLET | Freq: Four times a day (QID) | ORAL | Status: DC | PRN
  Filled 2023-03-08: qty 2

## 2023-03-08 MED ORDER — METHYLPREDNISOLONE SODIUM SUCC 125 MG IJ SOLR
125.0000 mg | Freq: Once | INTRAMUSCULAR | Status: AC
  Administered 2023-03-08: 125 mg via INTRAVENOUS
  Filled 2023-03-08: qty 2

## 2023-03-08 MED ORDER — ACETAMINOPHEN 650 MG RE SUPP: 650.0000 mg | Freq: Four times a day (QID) | RECTAL | Status: DC | PRN

## 2023-03-08 MED ORDER — IPRATROPIUM-ALBUTEROL 0.5-2.5 (3) MG/3ML IN SOLN
3.0000 mL | Freq: Once | RESPIRATORY_TRACT | Status: AC
  Administered 2023-03-08: 3 mL via RESPIRATORY_TRACT
  Filled 2023-03-08: qty 3

## 2023-03-08 MED ORDER — ASPIRIN 81 MG PO CHEW
81.0000 mg | CHEWABLE_TABLET | Freq: Every day | ORAL | Status: DC
  Administered 2023-03-08 – 2023-03-11 (×4): 81 mg via ORAL
  Filled 2023-03-08 (×4): qty 1

## 2023-03-08 MED ORDER — ONDANSETRON HCL 4 MG/2ML IJ SOLN: 4.0000 mg | Freq: Four times a day (QID) | INTRAMUSCULAR | Status: DC | PRN

## 2023-03-08 MED ORDER — ALBUTEROL SULFATE (2.5 MG/3ML) 0.083% IN NEBU
2.5000 mg | INHALATION_SOLUTION | Freq: Three times a day (TID) | RESPIRATORY_TRACT | Status: DC
  Administered 2023-03-09 – 2023-03-11 (×7): 2.5 mg via RESPIRATORY_TRACT
  Filled 2023-03-08 (×8): qty 3

## 2023-03-08 MED ORDER — FOLIC ACID 1 MG PO TABS
1.0000 mg | ORAL_TABLET | Freq: Every day | ORAL | Status: DC
  Administered 2023-03-09 – 2023-03-11 (×3): 1 mg via ORAL
  Filled 2023-03-08 (×3): qty 1

## 2023-03-08 MED ORDER — BUDESONIDE 0.25 MG/2ML IN SUSP
0.2500 mg | Freq: Two times a day (BID) | RESPIRATORY_TRACT | Status: DC
  Administered 2023-03-08 – 2023-03-11 (×6): 0.25 mg via RESPIRATORY_TRACT
  Filled 2023-03-08 (×7): qty 2

## 2023-03-08 NOTE — ED Notes (Signed)
RT Note: Patient oxygen saturation on room air while at rest: 95%, HR 89, RR16 Patient oxygen saturation on room air while ambulating : 88%, HR 136, RR 24 Patient had increased SOB and WOB while ambulating.  Recovery time was less than 5 minutes to maintain an oxygen saturation 94%

## 2023-03-08 NOTE — ED Notes (Signed)
Called Charleigh at [facility] for transport 14:50

## 2023-03-08 NOTE — Assessment & Plan Note (Signed)
82 year old presenting to ED with complaints of congestion, cough and worsening shortness of breath x 5 days found to have RSV in setting of asthma/COPD and hypoxic with walking down to 88% on room air -admit  -supportive therapy with pulmicort BID, scheduled albuterol q 6 hours and continued home inhalers -CXR with no consolidations  -received IV steroids in ED, continue oral steroids x 5 days in setting of asthma/COPD -contact/airborne precautions -discussed shedding time frame of virus  -RSV vaccine in November of 2023  -IS to bedside  -wean oxygen as tolerated

## 2023-03-08 NOTE — Assessment & Plan Note (Signed)
A1C pending Hold metformin SSI and accuchecks QAC/HS

## 2023-03-08 NOTE — ED Notes (Signed)
RT Note: RN placed patient on 2lpm [place] due to low saturations

## 2023-03-08 NOTE — H&P (Signed)
History and Physical    Patient: [name] YQM:[id number] DOB: May 09, 1940 DOA: 03/08/2023 DOS: the patient was seen and examined on 03/08/2023 PCP: Merri Brunette, MD  Patient coming from:  [facility]  - lives with his wife. Ambulates independently.    Chief Complaint: shortness of breath and cough   HPI: [name] is a 82 y.o. male with medical history significant of CAD s/p CABG x4, PAF, T2DM, HLD, asthma/COPD, prostate cancer, ischemic CM who presented to ED with complaints of shortness of breath and cough for about one week.  He states it started with a running nose that went into his chest. He has had congestion and coughing that started on Monday. His cough has gotten worse and is productive with clear sputum. He has a baseline shortness of breath and states he has DOE that started a few years ago. He saw his PCP this morning and he thought his chest sounded poor with shallowed/labored breathing and his oxygen was 90%. He sent him to the ED. No fever/chills or sick contacts. He ran out of his rescue inhaler so unsure if using more. He had his RSV vaccine 01/2022.   Denies any fever/chills, vision changes/headaches, chest pain or palpitations, abdominal pain, N/V/D, dysuria or leg swelling. Denies any weight gain, orthopnea.   Smokes 1 cigarette every 6 weeks and does not drink alcohol regularly.   ER Course:  vitals: afebrile, bp: 133/70, HR; 83, RR: 24, oxygen: 95% on RA>88% with walking and placed on 2L  Pertinent labs: bnp: 439, troponin 27>27, lactic acid: 1.2>2.0  CXR: no acute finding  In ED: given steroids and nebs. TRH asked to admit.   Review of Systems: As mentioned in the history of present illness. All other systems reviewed and are negative. 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   02/03/2016-respiratory allergy panel, environmental allergens to dust mites, cat dander, dog dander, Timothy grass, cockroach, fungus, cedar trees, ragweed, rough pigweed, mouse urine, IgE is 689 >>>Largest elevations are to cat dander, dog dander, dust mites  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)  06/27/2016-pulmonary function test-FVC 3.03 (87% predicted), postbronchodilator ratio 54, postbronc   Basal cell carcinoma 08/20/2016   left cheek-cx3 excision   BCC (basal cell carcinoma) 09/20/2016   left cheek-mohs   CAD (coronary artery disease)    cath & CABG in 2008   Cardiomyopathy, 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   COPD (chronic obstructive pulmonary disease) (HCC)    COPD with acute exacerbation (HCC) 04/02/2018   Diabetes (HCC)    type 2   DM2 (diabetes mellitus, type 2) (HCC) 02/05/2013   Dyslipidemia    Environmental allergies 02/03/2016   02/03/2016-respiratory allergy panel, environmental allergens to dust mites, cat dander, dog dander, Timothy grass, cockroach, fungus, cedar trees, ragweed, rough pigweed, mouse urine, IgE is 689 >>>Largest elevations are to cat dander, dog dander, dust mites    Essential hypertension 02/03/2016   Former smoker 05/14/2018   Former smoker Quit 2008 43-pack-year smoking history   History of nuclear stress test 10/08/2007   bruce myoview; normal scan with attenuation artifact; no  ischemia/infarct; low risk    Hypertriglyceridemia 02/03/2016   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 Maren Beach - LIMA to LAD, sequential  saphenous vein graft to OM1 and OM 2, SVG to posterior descending (done emergently)    Past Surgical History:  Procedure Laterality Date   CARDIAC CATHETERIZATION  2003   total occlusion of LAD with left to left collaterals, mod disease of RCA & mild disease in Cfx (Dr. Mervyn Skeeters. 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 Donata Clay)   NASAL SEPTOPLASTY W/ TURBINOPLASTY  1970s   PROSTATE BIOPSY     TONSILLECTOMY     Social History:  reports that he quit smoking about 16 years ago. His smoking use included cigarettes. He started smoking about 59 years ago. He has a 43.1 pack-year smoking history. He has never used smokeless tobacco. He reports current alcohol use. He reports that he does not use drugs.  Allergies  Allergen Reactions   Other Shortness Of Breath and Other (See Comments)    Feline dander   Lisinopril Cough    Family History  Problem Relation Age of Onset   Diabetes Mother        died of CVA at 90, also HTN   Alzheimer's disease Father    CAD Father    Heart attack Father 43       also HTN   Stroke Maternal Grandmother    Lung disease Neg Hx     Prior to Admission medications   Medication Sig Start Date End Date Taking? Authorizing Provider  metFORMIN (GLUCOPHAGE) 500 MG tablet Take 500 mg by mouth daily. 02/06/23  Yes [provider]  albuterol (VENTOLIN HFA) 108 (90 Base) MCG/ACT inhaler Inhale 2 puffs into the lungs every 6 (six) hours as needed for wheezing or shortness of breath. 05/18/22   Cobb, Ruby Cola, NP  aspirin 81 MG chewable tablet Chew 81 mg by mouth daily.    [provider]  Biotin 57846 MCG TABS Take 1 tablet by mouth daily.    [provider]  Cholecalciferol (VITAMIN D3) 25 MCG (1000 UT) CAPS Take 1 capsule by mouth daily.    [provider]  Cyanocobalamin (VITAMIN B-12 SL) Place 1 tablet under the  tongue daily.    [provider]  diltiazem (CARDIZEM CD) 180 MG 24 hr capsule TAKE 1 CAPSULE(180 MG) BY MOUTH DAILY 01/28/23   Camnitz, Will Daphine Deutscher, MD  fluticasone (FLONASE) 50 MCG/ACT nasal spray Place 1 spray into both nostrils as needed for allergies or rhinitis.    [provider]  folic acid (FOLVITE) 1 MG tablet Take 1 mg by mouth daily.    [provider]  losartan (COZAAR) 25 MG tablet Take 25 mg by mouth daily. as directed 07/29/17   [provider]  Omega-3 Fatty Acids (FISH OIL PO) Take 1 capsule by mouth at bedtime.    [provider]  pimecrolimus (ELIDEL) 1 % cream Apply topically 2 (two) times daily. 10/18/22   Terri Piedra, DO  pravastatin (PRAVACHOL) 40 MG tablet Take 1 tablet (40 mg total) by mouth daily. 02/17/14   Zoila Shutter  C, MD  SYMBICORT 80-4.5 MCG/ACT inhaler INHALE 2 PUFFS BY MOUTH IN THE MORNING AND 2 PUFFS AT BEDTIME 11/23/22   Oretha Milch, MD    Physical Exam: Vitals:   03/08/23 1308 03/08/23 1415 03/08/23 1451 03/08/23 1643  BP:  (!) 118/55 118/69 114/61  Pulse:  76 82 79  Resp:  (!) 25 18 20   Temp:   97.8 F (36.6 C) 98 F (36.7 C)  TempSrc:   Oral Oral  SpO2: 98% 99% 100% 100%  Weight:       General:  Appears calm and comfortable and is in NAD Eyes:  PERRL, EOMI, normal lids, iris ENT:  HOH, lips & tongue, mmm; appropriate dentition Neck:  no LAD, masses or thyromegaly; no carotid bruits or JVD  Cardiovascular:  RRR, no m/r/g. No LE edema.  Respiratory:   inspiratory wheeze occasionally with course breath sounds. Mild dyspnea on talking, no retractions.  Abdomen:  soft, NT, ND, NABS Back:   normal alignment, no CVAT Skin:  no rash or induration seen on limited exam Musculoskeletal:  grossly normal tone BUE/BLE, good ROM, no bony abnormality Lower extremity:  No LE edema.  Limited foot exam with no ulcerations.  2+ distal pulses. Psychiatric:  grossly normal mood and affect, speech fluent and  appropriate, AOx3 Neurologic:  CN 2-12 grossly intact, moves all extremities in coordinated fashion, sensation intact   Radiological Exams on Admission: Independently reviewed - see discussion in A/P where applicable  DG Chest Port 1 View Result Date: 03/08/2023 CLINICAL DATA:  Cough. Shortness of breath with congestion for 1 week EXAM: PORTABLE CHEST 1 VIEW COMPARISON:  12/22/2021 FINDINGS: Numerous leads and wires project over the chest. Midline trachea. Normal heart size. Median sternotomy for CABG. Atherosclerosis in the transverse aorta. No pleural effusion or pneumothorax. Mild lower lung predominant interstitial thickening is likely related to remote smoking history. Scarring along the left heart border. IMPRESSION: No acute cardiopulmonary disease. Aortic Atherosclerosis (ICD10-I70.0). Electronically Signed   By: Jeronimo Greaves M.D.   On: 03/08/2023 11:37    EKG: Independently reviewed.  NSR with rate 88; nonspecific ST changes with no evidence of acute ischemia. PVC   Labs on Admission: I have personally reviewed the available labs and imaging studies at the time of the admission.  Pertinent labs:   bnp: 439,  troponin 27>27,  lactic acid: 1.2>2.0 RSV +   Assessment and Plan: Principal Problem:   Respiratory failure with hypoxia secondary to RSV Active Problems:   Asthma-COPD overlap syndrome (HCC)   Paroxysmal atrial fibrillation (HCC)   CAD S/P CABG x 4 in 2008   DM2 (diabetes mellitus, type 2) (HCC)   Essential hypertension   Dyslipidemia   CAD (coronary artery disease)   RSV (respiratory syncytial virus pneumonia)    Assessment and Plan: * Respiratory failure with hypoxia secondary to RSV 82 year old presenting to ED with complaints of congestion, cough and worsening shortness of breath x 5 days found to have RSV in setting of asthma/COPD and hypoxic with walking down to 88% on room air -admit  -supportive therapy with pulmicort BID, scheduled albuterol q 6 hours  and continued home inhalers -CXR with no consolidations  -received IV steroids in ED, continue oral steroids x 5 days in setting of asthma/COPD -contact/airborne precautions -discussed shedding time frame of virus  -RSV vaccine in November of 2023  -IS to bedside  -wean oxygen as tolerated    Asthma-COPD overlap syndrome (HCC) -followed by pulm -has not  been using rescue inhaler more as he has run out of this -does think he has been wheezing more and felt tighter -received dose of IV steroids today, continue 5 day oral course tomorrow in setting of RSV  -schedule albuterol neb q6 hours -pulmicort neb BID in setting of RSV -continue symbicort BID   Paroxysmal atrial fibrillation (HCC) - CHA2DS2-VASc of 5 -In NSR, denies any palpitations  -Followed by Dr. Elberta Fortis -per note: had discussion with Dr. Elberta Fortis, he had significant bruising on anticoagulation therapy, ultimately, he was switched to aspirin therapy. He is aware that he will have increased chance for stroke  -continue ASA   CAD S/P CABG x 4 in 2008 Emergent CABG in 2008  Troponin flat with no chest pain/palpitations  Continue medical therapy   DM2 (diabetes mellitus, type 2) (HCC) A1C pending Hold metformin SSI and accuchecks QAC/HS   Essential hypertension Continue cardizem Blood pressures to goal, has not been taking his cozaar over past week. Hold for now   Dyslipidemia Continue pravachol    Advance Care Planning:   Code Status: Full Code   Consults: none   DVT Prophylaxis: lovenox   Family Communication: wife at bedside   Severity of Illness: The appropriate patient status for this patient is OBSERVATION. Observation status is judged to be reasonable and necessary in order to provide the required intensity of service to ensure the patient's safety. The patient's presenting symptoms, physical exam findings, and initial radiographic and laboratory data in the context of their medical condition is felt to  place them at decreased risk for further clinical deterioration. Furthermore, it is anticipated that the patient will be medically stable for discharge from the hospital within 2 midnights of admission.   Author: Orland Mustard, MD 03/08/2023 6:40 PM  For on call review [email or web address].

## 2023-03-08 NOTE — ED Notes (Addendum)
Report given to [facility]. In route to transport patient.

## 2023-03-08 NOTE — ED Notes (Addendum)
Pt placed on 2L of oxygen via  per MD due to SOB while at rest. RR at 25.

## 2023-03-08 NOTE — Assessment & Plan Note (Signed)
Continue cardizem Blood pressures to goal, has not been taking his cozaar over past week. Hold for now

## 2023-03-08 NOTE — Assessment & Plan Note (Signed)
Continue pravachol.  

## 2023-03-08 NOTE — ED Triage Notes (Signed)
Pt c/o cough, shob with congestion x 1 week, endorses concern for Pna. Referred by pcp

## 2023-03-08 NOTE — Assessment & Plan Note (Signed)
-  followed by pulm -has not been using rescue inhaler more as he has run out of this -does think he has been wheezing more and felt tighter -received dose of IV steroids today, continue 5 day oral course tomorrow in setting of RSV  -schedule albuterol neb q6 hours -pulmicort neb BID in setting of RSV -continue symbicort BID

## 2023-03-08 NOTE — ED Provider Notes (Signed)
[place] EMERGENCY DEPARTMENT AT [facility] Provider Note   CSN: [id number] Arrival date & time: 03/08/23  1025     History  Chief Complaint  Patient presents with   Shortness of Breath    [name] is a 82 y.o. male.  Pt is a 82 yo male with pmhx significant for CAD s/p CABG, DM2, HLD, COPD, prostate cancer, and basal cell carcinoma.  Pt comes to the ED today with sob and cough that he's had for about a week.  No fevers.         Home Medications Prior to Admission medications   Medication Sig Start Date End Date Taking? Authorizing Provider  metFORMIN (GLUCOPHAGE) 500 MG tablet Take 500 mg by mouth daily. 02/06/23  Yes [provider]  albuterol (VENTOLIN HFA) 108 (90 Base) MCG/ACT inhaler Inhale 2 puffs into the lungs every 6 (six) hours as needed for wheezing or shortness of breath. 05/18/22   Cobb, Ruby Cola, NP  aspirin 81 MG chewable tablet Chew 81 mg by mouth daily.    [provider]  Biotin 40981 MCG TABS Take 1 tablet by mouth daily.    [provider]  Cholecalciferol (VITAMIN D3) 25 MCG (1000 UT) CAPS Take 1 capsule by mouth daily.    [provider]  Cyanocobalamin (VITAMIN B-12 SL) Place 1 tablet under the tongue daily.    [provider]  diltiazem (CARDIZEM CD) 180 MG 24 hr capsule TAKE 1 CAPSULE(180 MG) BY MOUTH DAILY 01/28/23   Camnitz, Will Daphine Deutscher, MD  fluticasone (FLONASE) 50 MCG/ACT nasal spray Place 1 spray into both nostrils as needed for allergies or rhinitis.    [provider]  folic acid (FOLVITE) 1 MG tablet Take 1 mg by mouth daily.    [provider]  losartan (COZAAR) 25 MG tablet Take 25 mg by mouth daily. as directed 07/29/17   [provider]  Omega-3 Fatty Acids (FISH OIL PO) Take 1 capsule by mouth at bedtime.    [provider]  pimecrolimus (ELIDEL) 1 % cream Apply topically 2 (two) times daily. 10/18/22   Terri Piedra, DO  pravastatin  (PRAVACHOL) 40 MG tablet Take 1 tablet (40 mg total) by mouth daily. 02/17/14   Hilty, Lisette Abu, MD  SYMBICORT 80-4.5 MCG/ACT inhaler INHALE 2 PUFFS BY MOUTH IN THE MORNING AND 2 PUFFS AT BEDTIME 11/23/22   Oretha Milch, MD      Allergies    Lisinopril    Review of Systems   Review of Systems  HENT:  Positive for congestion.   Respiratory:  Positive for cough, shortness of breath and wheezing.   All other systems reviewed and are negative.   Physical Exam Updated Vital Signs BP 131/72   Pulse 75   Temp (!) 97.5 F (36.4 C)   Resp (!) 22   Wt 63.5 kg   SpO2 98%   BMI 22.60 kg/m  Physical Exam Vitals and nursing note reviewed.  Constitutional:      Appearance: He is well-developed. He is ill-appearing.  HENT:     Head: Normocephalic and atraumatic.     Mouth/Throat:     Mouth: Mucous membranes are moist.     Pharynx: Oropharynx is clear.  Eyes:     Extraocular Movements: Extraocular movements intact.     Pupils: Pupils are equal, round, and reactive to light.  Cardiovascular:     Rate and Rhythm: Normal rate and regular rhythm.  Pulmonary:  Effort: Tachypnea present.     Breath sounds: Wheezing and rhonchi present.  Abdominal:     General: Bowel sounds are normal.     Palpations: Abdomen is soft.  Musculoskeletal:        General: Normal range of motion.     Cervical back: Normal range of motion and neck supple.  Skin:    General: Skin is warm.     Capillary Refill: Capillary refill takes less than 2 seconds.  Neurological:     General: No focal deficit present.     Mental Status: He is alert and oriented to person, place, and time.  Psychiatric:        Mood and Affect: Mood normal.        Behavior: Behavior normal.     ED Results / Procedures / Treatments   Labs (all labs ordered are listed, but only abnormal results are displayed) Labs Reviewed  RESP PANEL BY RT-PCR (RSV, FLU A&B, COVID)  [id number] - Abnormal; Notable for the following components:       Result Value   Resp Syncytial Virus by PCR POSITIVE (*)    All other components within normal limits  BASIC METABOLIC PANEL - Abnormal; Notable for the following components:   Glucose, Bld 143 (*)    All other components within normal limits  CBC WITH DIFFERENTIAL/PLATELET - Abnormal; Notable for the following components:   Neutro Abs 7.8 (*)    All other components within normal limits  BRAIN NATRIURETIC PEPTIDE - Abnormal; Notable for the following components:   B Natriuretic Peptide 439.5 (*)    All other components within normal limits  TROPONIN I (HIGH SENSITIVITY) - Abnormal; Notable for the following components:   Troponin I (High Sensitivity) 27 (*)    All other components within normal limits  CULTURE, BLOOD (ROUTINE X 2)  CULTURE, BLOOD (ROUTINE X 2)  LACTIC ACID, PLASMA  LACTIC ACID, PLASMA  TROPONIN I (HIGH SENSITIVITY)    EKG EKG Interpretation Date/Time:  Friday March 08 2023 10:39:35 EST Ventricular Rate:  88 PR Interval:  57 QRS Duration:  122 QT Interval:  425 QTC Calculation: 491 R Axis:   17  Text Interpretation: Sinus rhythm Multiform ventricular premature complexes Short PR interval IVCD, consider atypical RBBB Borderline ST depression, lateral leads PVCs are new Confirmed by Jacalyn Lefevre [phone]) on 03/08/2023 10:42:47 AM  Radiology DG Chest Port 1 View Result Date: 03/08/2023 CLINICAL DATA:  Cough. Shortness of breath with congestion for 1 week EXAM: PORTABLE CHEST 1 VIEW COMPARISON:  12/22/2021 FINDINGS: Numerous leads and wires project over the chest. Midline trachea. Normal heart size. Median sternotomy for CABG. Atherosclerosis in the transverse aorta. No pleural effusion or pneumothorax. Mild lower lung predominant interstitial thickening is likely related to remote smoking history. Scarring along the left heart border. IMPRESSION: No acute cardiopulmonary disease. Aortic Atherosclerosis (ICD10-I70.0). Electronically Signed   By: Jeronimo Greaves M.D.    On: 03/08/2023 11:37    Procedures Procedures    Medications Ordered in ED Medications  ipratropium-albuterol (DUONEB) 0.5-2.5 (3) MG/3ML nebulizer solution 3 mL (3 mLs Nebulization Given 03/08/23 1040)  methylPREDNISolone sodium succinate (SOLU-MEDROL) 125 mg/2 mL injection 125 mg (125 mg Intravenous Given 03/08/23 1119)  albuterol (PROVENTIL) (2.5 MG/3ML) 0.083% nebulizer solution 5 mg (5 mg Nebulization Given 03/08/23 1308)    ED Course/ Medical Decision Making/ A&P  Medical Decision Making Amount and/or Complexity of Data Reviewed Labs: ordered. Radiology: ordered.  Risk Prescription drug management. Decision regarding hospitalization.   This patient presents to the ED for concern of sob, this involves an extensive number of treatment options, and is a complaint that carries with it a high risk of complications and morbidity.  The differential diagnosis includes covid/flu/rsv, pna, copd, cardiac   Co morbidities that complicate the patient evaluation  CAD s/p CABG, DM2, HLD, COPD, prostate cancer, and basal cell carcinoma   Additional history obtained:  Additional history obtained from epic chart review External records from outside source obtained and reviewed including wife   Lab Tests:  I Ordered, and personally interpreted labs.  The pertinent results include:  cbc nl, trop 27, lactic nl, +RSV, neg flu/covid, bnp sl elevated at 439.5, bmp nl   Imaging Studies ordered:  I ordered imaging studies including cxr  I independently visualized and interpreted imaging which showed  No acute cardiopulmonary disease.    Aortic Atherosclerosis (ICD10-I70.0).   I agree with the radiologist interpretation   Cardiac Monitoring:  The patient was maintained on a cardiac monitor.  I personally viewed and interpreted the cardiac monitored which showed an underlying rhythm of: nsr   Medicines ordered and prescription drug  management:  I ordered medication including solumedrol/nebs  for sx  Reevaluation of the patient after these medicines showed that the patient improved I have reviewed the patients home medicines and have made adjustments as needed   Critical Interventions:  oxygen   Consultations Obtained:  I requested consultation with the hospitalist (Dr. Kirby Crigler),  and discussed lab and imaging findings as well as pertinent plan - he will admit.   Problem List / ED Course:  RSV with hypoxia:  pt is feeling better after his nebs, but O2 sat drops to 88% and HR goes up to the 130s with ambulation.  Pt put on 2L oxygen and is given additional nebs.   Reevaluation:  After the interventions noted above, I reevaluated the patient and found that they have :improved   Social Determinants of Health:  Lives at home   Dispostion:  After consideration of the diagnostic results and the patients response to treatment, I feel that the patent would benefit from admission.  CRITICAL CARE Performed by: Jacalyn Lefevre   Total critical care time: 30 minutes  Critical care time was exclusive of separately billable procedures and treating other patients.  Critical care was necessary to treat or prevent imminent or life-threatening deterioration.  Critical care was time spent personally by me on the following activities: development of treatment plan with patient and/or surrogate as well as nursing, discussions with consultants, evaluation of patient's response to treatment, examination of patient, obtaining history from patient or surrogate, ordering and performing treatments and interventions, ordering and review of laboratory studies, ordering and review of radiographic studies, pulse oximetry and re-evaluation of patient's condition.           Final Clinical Impression(s) / ED Diagnoses Final diagnoses:  RSV (acute bronchiolitis due to respiratory syncytial virus)  Acute respiratory  failure with hypoxia [facility])    Rx / DC Orders ED Discharge Orders     None         Jacalyn Lefevre, MD 03/08/23 1321

## 2023-03-08 NOTE — Assessment & Plan Note (Addendum)
Emergent CABG in 2008  Troponin flat with no chest pain/palpitations  Continue medical therapy

## 2023-03-08 NOTE — Plan of Care (Signed)
  Problem: Education: Goal: Knowledge of General Education information will improve Description: Including pain rating scale, medication(s)/side effects and non-pharmacologic comfort measures Outcome: Progressing   Problem: Activity: Goal: Risk for activity intolerance will decrease Outcome: Progressing   

## 2023-03-08 NOTE — Assessment & Plan Note (Addendum)
-   CHA2DS2-VASc of 5 -In NSR, denies any palpitations  -Followed by Dr. Elberta Fortis -per note: had discussion with Dr. Elberta Fortis, he had significant bruising on anticoagulation therapy, ultimately, he was switched to aspirin therapy. He is aware that he will have increased chance for stroke  -continue ASA

## 2023-03-09 DIAGNOSIS — J210 Acute bronchiolitis due to respiratory syncytial virus: Secondary | ICD-10-CM

## 2023-03-09 LAB — BASIC METABOLIC PANEL
Anion gap: 7 (ref 5–15)
BUN: 24 mg/dL — ABNORMAL HIGH (ref 8–23)
CO2: 20 mmol/L — ABNORMAL LOW (ref 22–32)
Calcium: 8.1 mg/dL — ABNORMAL LOW (ref 8.9–10.3)
Chloride: 107 mmol/L (ref 98–111)
Creatinine, Ser: 0.76 mg/dL (ref 0.61–1.24)
GFR, Estimated: >60 mL/min (ref >60)
Glucose, Bld: 232 mg/dL — ABNORMAL HIGH (ref 70–99)
Potassium: 3.9 mmol/L (ref 3.5–5.1)
Sodium: 134 mmol/L — ABNORMAL LOW (ref 135–145)

## 2023-03-09 LAB — CBC
HCT: 34 % — ABNORMAL LOW (ref 39.0–52.0)
Hemoglobin: 11.3 g/dL — ABNORMAL LOW (ref 13.0–17.0)
MCH: 30.2 pg (ref 26.0–34.0)
MCHC: 33.2 g/dL (ref 30.0–36.0)
MCV: 90.9 fL (ref 80.0–100.0)
Platelets: 169 10*3/uL (ref 150–400)
RBC: 3.74 MIL/uL — ABNORMAL LOW (ref 4.22–5.81)
RDW: 12.9 % (ref 11.5–15.5)
WBC: 9.3 10*3/uL (ref 4.0–10.5)
nRBC: 0 % (ref 0.0–0.2)

## 2023-03-09 LAB — GLUCOSE, CAPILLARY
Glucose-Capillary: 185 mg/dL — ABNORMAL HIGH (ref 70–99)
Glucose-Capillary: 180 mg/dL — ABNORMAL HIGH (ref 70–99)
Glucose-Capillary: 273 mg/dL — ABNORMAL HIGH (ref 70–99)
Glucose-Capillary: 230 mg/dL — ABNORMAL HIGH (ref 70–99)

## 2023-03-09 LAB — LACTIC ACID, PLASMA: Lactic Acid, Venous: 1.9 mmol/L (ref 0.5–1.9)

## 2023-03-09 LAB — HEMOGLOBIN A1C
Hgb A1c MFr Bld: 6.4 % — ABNORMAL HIGH (ref 4.8–5.6)
Mean Plasma Glucose: 136.98 mg/dL

## 2023-03-09 MED ORDER — SODIUM CHLORIDE 0.9 % IV BOLUS
500.0000 mL | Freq: Once | INTRAVENOUS | Status: AC
  Administered 2023-03-09: 500 mL via INTRAVENOUS

## 2023-03-09 MED ORDER — ARFORMOTEROL TARTRATE 15 MCG/2ML IN NEBU
15.0000 ug | INHALATION_SOLUTION | Freq: Two times a day (BID) | RESPIRATORY_TRACT | Status: DC
  Administered 2023-03-10 – 2023-03-11 (×3): 15 ug via RESPIRATORY_TRACT
  Filled 2023-03-09 (×3): qty 2

## 2023-03-09 NOTE — Progress Notes (Signed)
Mobility Specialist - Progress Note   03/09/23 1010  Oxygen Therapy  SpO2 (!) 87 %  O2 Device Room Air  Patient Activity (if Appropriate) Ambulating  Mobility  Activity Ambulated independently in hallway  Level of Assistance Independent  Assistive Device None  Distance Ambulated (ft) 500 ft  Activity Response Tolerated well  Mobility Referral Yes  Mobility visit 1 Mobility  Mobility Specialist Start Time (ACUTE ONLY) [id number]  Mobility Specialist Stop Time (ACUTE ONLY) 1009  Mobility Specialist Time Calculation (min) (ACUTE ONLY) 11 min   Pt received in bed and agreeable to mobility. Pt desat to 88%, encouraged pursed lip breaths bringing SpO2 to 94%. No complaints during session. Pt to bathroom after session with all needs met.    Pre-mobility: 90% SpO2 (RA) During mobility: 96 HR, 87%-94% SpO2 (RA) Post-mobility: 94 HR, 94 SPO2 (RA)  Chief Technology Officer

## 2023-03-09 NOTE — Care Management Obs Status (Signed)
MEDICARE OBSERVATION STATUS NOTIFICATION   Patient Details  Name: [name] MRN: [id number] Date of Birth: September 17, 1940   Medicare Observation Status Notification Given:  Yes    Adrian Prows, RN 03/09/2023, 12:13 PM

## 2023-03-09 NOTE — Progress Notes (Signed)
Mobility Specialist - Progress Note   03/09/23 1538  Oxygen Therapy  SpO2 (!) 86 %  O2 Device Room Air  Patient Activity (if Appropriate) Ambulating  Mobility  Activity Ambulated independently in hallway  Level of Assistance Independent  Assistive Device None  Distance Ambulated (ft) 500 ft  Activity Response Tolerated well  Mobility Referral Yes  Mobility visit 1 Mobility  Mobility Specialist Start Time (ACUTE ONLY) 1524  Mobility Specialist Stop Time (ACUTE ONLY) 1532  Mobility Specialist Time Calculation (min) (ACUTE ONLY) 8 min   Pt received in bed and agreeable to mobility. During ambulation, pt desat to 86% but able to recover in seconds to 93%. No complaints during session, despite drop in SpO2. Pt to bed after session with all needs met.    Pre-mobility:  97% SpO2 During mobility: 90 HR, 86%-93% SpO2 Post-mobility: 86 HR, 93% SPO2  Chief Technology Officer

## 2023-03-09 NOTE — Hospital Course (Signed)
PMH of CAD SP CABG, PAF, type II DM, HLD, COPD, prostate cancer presented to hospital with complaints of shortness of breath and cough. Found to have RSV pneumonia. Has hypoxia still.  Assessment and Plan: RSV bronchiolitis/pneumonia. Acute hypoxia Saturating 86% on exertion on room air. Was actually 88% on room air at rest. Now at 100% on room air at rest. Bilateral expiratory wheezing heard. Somewhat tachypneic as well.  Plan continue with steroids.  Continue with nebulizer therapy. Monitor for now.  History of asthma and COPD. Currently on bilateral expiratory wheezing. No evidence of bacterial infection. Monitor.  Paroxysmal A-fib. Currently on aspirin.  Not on any anticoagulation. Rate controlled.  CAD SP CABG. LVH. No acute abnormality. Monitor blood  Type of diabetes mellitus. Continue sliding scale insulin.  HTN. Blood pressure stable. Continue Cardizem.  HLD. On statin.

## 2023-03-09 NOTE — TOC Initial Note (Signed)
Transition of Care [facility]) - Initial/Assessment Note    Patient Details  Name: [name] MRN: [id number] Date of Birth: 23-Mar-1940  [facility] CM/SW Contact:    Adrian Prows, RN Phone Number: 03/09/2023, 12:22 PM  Clinical Narrative:                 Sherron Monday w/ pt in room; pt says he lives at home w/ his wife; he plans to return at d/c; pt verified POC wife [name] [phone]); pt says his wife will provide transportation; pt verified he has insurance/PCP; he denies SDOH risks; pt says he does not have DME, HH services, or home oxygen; TOC will follow.  Expected Discharge Plan: Home/Self Care Barriers to Discharge: No Barriers Identified   Patient Goals and CMS Choice Patient states their goals for this hospitalization and ongoing recovery are:: home CMS Medicare.gov Compare Post Acute Care list provided to:: Patient   [place] ownership interest in [facility].provided to:: Patient    Expected Discharge Plan and Services   Discharge Planning Services: CM Consult   Living arrangements for the past 2 months: Single Family Home                                      Prior Living Arrangements/Services Living arrangements for the past 2 months: Single Family Home Lives with:: Spouse Patient language and need for interpreter reviewed:: Yes Do you feel safe going back to the place where you live?: Yes      Need for Family Participation in Patient Care: Yes (Comment) Care giver support system in place?: Yes (comment) Current home services:  (n/a) Criminal Activity/Legal Involvement Pertinent to Current Situation/Hospitalization: No - Comment as needed  Activities of Daily Living   ADL Screening (condition at time of admission) Independently performs ADLs?: Yes (appropriate for developmental age) Is the patient deaf or have difficulty hearing?: No Does the patient have difficulty seeing, even when wearing glasses/contacts?: No Does  the patient have difficulty concentrating, remembering, or making decisions?: No  Permission Sought/Granted Permission sought to share information with : Case Manager Permission granted to share information with : Yes, Verbal Permission Granted  Share Information with NAME: Case Manager     Permission granted to share info w Relationship: Takuya Topf (spouse) [phone]     Emotional Assessment Appearance:: Appears stated age Attitude/Demeanor/Rapport: Gracious Affect (typically observed): Accepting Orientation: : Oriented to Self Alcohol / Substance Use: Not Applicable Psych Involvement: No (comment)  Admission diagnosis:  RSV (acute bronchiolitis due to respiratory syncytial virus) [J21.0] Respiratory failure with hypoxia (HCC) [J96.91] Acute respiratory failure with hypoxia (HCC) [J96.01] Patient Active Problem List   Diagnosis Date Noted   Respiratory failure with hypoxia secondary to RSV 03/08/2023   RSV (respiratory syncytial virus pneumonia) 03/08/2023   Paroxysmal atrial fibrillation (HCC) 03/08/2023   Allergic rhinitis 12/22/2021   Healthcare maintenance 12/04/2019   Abnormal finding on lung imaging 05/14/2018   Former smoker 05/14/2018   Acute exacerbation of COPD with asthma (HCC) 04/02/2018   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 [address] [place] [address] Phone: [phone] Fax: [phone]     Social Drivers of  Health (SDOH) Social History: SDOH Screenings   Food Insecurity: No Food Insecurity (03/09/2023)  Housing: Low Risk  (03/09/2023)  Transportation Needs: No Transportation Needs (03/09/2023)  Utilities: Not At Risk (03/09/2023)  Tobacco Use: Medium Risk (03/08/2023)   SDOH Interventions: Food Insecurity Interventions: Intervention Not Indicated, Inpatient TOC Housing Interventions: Intervention Not Indicated, Inpatient TOC Transportation Interventions: Intervention Not Indicated, Inpatient TOC Utilities Interventions: Intervention Not Indicated, Inpatient TOC   Readmission Risk Interventions     No data to display

## 2023-03-09 NOTE — Progress Notes (Signed)
[facility] Progress Note Patient: [name]:[id number] DOB: 1940/10/27 DOA: 03/08/2023  DOS: the patient was seen and examined on 03/09/2023  Brief Hospital Course: PMH of CAD SP CABG, PAF, type II DM, HLD, COPD, prostate cancer presented to hospital with complaints of shortness of breath and cough. Found to have RSV pneumonia. Has hypoxia still.  Assessment and Plan: RSV bronchiolitis/pneumonia. Acute hypoxia Saturating 86% on exertion on room air. Was actually 88% on room air at rest. Now at 100% on room air at rest. Bilateral expiratory wheezing heard. Somewhat tachypneic as well.  Plan continue with steroids.  Continue with nebulizer therapy. Monitor for now.  History of asthma and COPD. Currently on bilateral expiratory wheezing. No evidence of bacterial infection. Monitor.  Paroxysmal A-fib. Currently on aspirin.  Not on any anticoagulation. Rate controlled.  CAD SP CABG. LVH. No acute abnormality. Monitor blood  Type of diabetes mellitus. Continue sliding scale insulin.  HTN. Blood pressure stable. Continue Cardizem.  HLD. On statin.   Subjective: No nausea no vomiting no fever no chills.  No chest pain.  Physical Exam: General: in Mild distress, No Rash Cardiovascular: S1 and S2 Present, No Murmur Respiratory: Good respiratory effort, Bilateral Air entry present. No Crackles, bilateral expiratory wheezes Abdomen: Bowel Sound present, No tenderness Extremities: No edema Neuro: Alert and oriented x3, no new focal deficit  Data Reviewed: I have Reviewed nursing notes, Vitals, and Lab results. Since last encounter, pertinent lab results CBC and BMP   . I have ordered test including CBC and BMP  .   Disposition: Status is: Observation  enoxaparin (LOVENOX) injection 40 mg Start: 03/08/23 2200   Family Communication: Discussed with family at bedside. Level of care: Med-Surg   Vitals:   03/09/23 0936 03/09/23 1010 03/09/23 1414 03/09/23  1538  BP: 111/60  (!) 102/53   Pulse: 84  76   Resp: 18  20   Temp: 97.6 F (36.4 C)  98.2 F (36.8 C)   TempSrc: Oral  Oral   SpO2: 100% (!) 87% 97% (!) 86%  Weight:         Author: Lynden Oxford, MD 03/09/2023 7:13 PM  Please look on [email or web address] to find out who is on call.

## 2023-03-10 ENCOUNTER — Observation Stay (HOSPITAL_COMMUNITY): Payer: Medicare Other

## 2023-03-10 DIAGNOSIS — J9811 Atelectasis: Secondary | ICD-10-CM | POA: Diagnosis not present

## 2023-03-10 DIAGNOSIS — R0602 Shortness of breath: Secondary | ICD-10-CM | POA: Diagnosis not present

## 2023-03-10 DIAGNOSIS — J210 Acute bronchiolitis due to respiratory syncytial virus: Secondary | ICD-10-CM | POA: Diagnosis not present

## 2023-03-10 LAB — CBC WITH DIFFERENTIAL/PLATELET
Abs Immature Granulocytes: 0.08 10*3/uL — ABNORMAL HIGH (ref 0.00–0.07)
Basophils Absolute: 0 10*3/uL (ref 0.0–0.1)
Basophils Relative: 0 %
Eosinophils Absolute: 0 10*3/uL (ref 0.0–0.5)
Eosinophils Relative: 0 %
HCT: 33.4 % — ABNORMAL LOW (ref 39.0–52.0)
Hemoglobin: 11.2 g/dL — ABNORMAL LOW (ref 13.0–17.0)
Immature Granulocytes: 1 %
Lymphocytes Relative: 7 %
Lymphs Abs: 0.9 10*3/uL (ref 0.7–4.0)
MCH: 31 pg (ref 26.0–34.0)
MCHC: 33.5 g/dL (ref 30.0–36.0)
MCV: 92.5 fL (ref 80.0–100.0)
Monocytes Absolute: 0.5 10*3/uL (ref 0.1–1.0)
Monocytes Relative: 4 %
Neutro Abs: 11.1 10*3/uL — ABNORMAL HIGH (ref 1.7–7.7)
Neutrophils Relative %: 88 %
Platelets: 190 10*3/uL (ref 150–400)
RBC: 3.61 MIL/uL — ABNORMAL LOW (ref 4.22–5.81)
RDW: 13.2 % (ref 11.5–15.5)
WBC: 12.7 10*3/uL — ABNORMAL HIGH (ref 4.0–10.5)
nRBC: 0 % (ref 0.0–0.2)

## 2023-03-10 LAB — COMPREHENSIVE METABOLIC PANEL
ALT: 33 U/L (ref 0–44)
AST: 32 U/L (ref 15–41)
Albumin: 3.2 g/dL — ABNORMAL LOW (ref 3.5–5.0)
Alkaline Phosphatase: 38 U/L (ref 38–126)
Anion gap: 9 (ref 5–15)
BUN: 33 mg/dL — ABNORMAL HIGH (ref 8–23)
CO2: 21 mmol/L — ABNORMAL LOW (ref 22–32)
Calcium: 8.5 mg/dL — ABNORMAL LOW (ref 8.9–10.3)
Chloride: 105 mmol/L (ref 98–111)
Creatinine, Ser: 0.81 mg/dL (ref 0.61–1.24)
GFR, Estimated: >60 mL/min (ref >60)
Glucose, Bld: 226 mg/dL — ABNORMAL HIGH (ref 70–99)
Potassium: 4.2 mmol/L (ref 3.5–5.1)
Sodium: 135 mmol/L (ref 135–145)
Total Bilirubin: 0.6 mg/dL (ref <1.2)
Total Protein: 6.1 g/dL — ABNORMAL LOW (ref 6.5–8.1)

## 2023-03-10 LAB — GLUCOSE, CAPILLARY
Glucose-Capillary: 116 mg/dL — ABNORMAL HIGH (ref 70–99)
Glucose-Capillary: 167 mg/dL — ABNORMAL HIGH (ref 70–99)
Glucose-Capillary: 248 mg/dL — ABNORMAL HIGH (ref 70–99)
Glucose-Capillary: 193 mg/dL — ABNORMAL HIGH (ref 70–99)

## 2023-03-10 LAB — PROCALCITONIN: Procalcitonin: <0.1 ng/mL

## 2023-03-10 MED ORDER — ALBUTEROL SULFATE (2.5 MG/3ML) 0.083% IN NEBU
2.5000 mg | INHALATION_SOLUTION | RESPIRATORY_TRACT | Status: DC | PRN
  Administered 2023-03-10 – 2023-03-11 (×3): 2.5 mg via RESPIRATORY_TRACT
  Filled 2023-03-10 (×3): qty 3

## 2023-03-10 MED ORDER — HYDROCODONE BIT-HOMATROP MBR 5-1.5 MG/5ML PO SOLN
5.0000 mL | ORAL | Status: DC | PRN
  Administered 2023-03-10: 5 mL via ORAL
  Filled 2023-03-10: qty 5

## 2023-03-10 NOTE — Progress Notes (Signed)
Mobility Specialist - Progress Note   03/10/23 1032  Oxygen Therapy  SpO2 (!) 87 %  O2 Device Room Air  Patient Activity (if Appropriate) Ambulating  Mobility  Activity Ambulated independently in hallway  Level of Assistance Independent  Assistive Device None  Distance Ambulated (ft) 500 ft  Activity Response Tolerated well  Mobility Referral Yes  Mobility visit 1 Mobility  Mobility Specialist Start Time (ACUTE ONLY) 1021  Mobility Specialist Stop Time (ACUTE ONLY) 1031  Mobility Specialist Time Calculation (min) (ACUTE ONLY) 10 min   Pt received in bed and agreeable to mobility. Pt took x2 standing rest breaks d/t SpO2 drop & SOB. Pt stated that he felt as if his breathing was more labored today compared to yesterday. No complaints during session. Pt to bed after session with all needs met.    Pre-mobility: 92% SpO2 During mobility: 87-91% SpO2 Post-mobility: 91% SPO2  Chief Technology Officer

## 2023-03-10 NOTE — Progress Notes (Signed)
[facility] Progress Note Patient: [name] UUV:[id number] DOB: 1940/12/24 DOA: 03/08/2023  DOS: the patient was seen and examined on 03/10/2023  Brief Hospital Course: PMH of CAD SP CABG, PAF, type II DM, HLD, COPD, prostate cancer presented to hospital with complaints of shortness of breath and cough. Found to have RSV pneumonia. Has hypoxia still.  Assessment and Plan: RSV bronchiolitis/pneumonia. Acute hypoxia Saturating 86% on exertion on room air. Bilateral expiratory wheezing heard.  Appears to be worsening on examination on 12/22. Procalcitonin level again check negative.  Chest x-ray shows no acute worsening. Continue steroid and supportive care and nebulizer therapy and monitor.  History of asthma and COPD. Currently on bilateral expiratory wheezing. No evidence of bacterial infection. Monitor.  Paroxysmal A-fib. Currently on aspirin.  Not on any anticoagulation. Rate controlled.  CAD SP CABG. LVH. No acute abnormality. Monitor blood  Type 2 diabetes mellitus. Continue sliding scale insulin.  Metformin on hold.  HTN. Blood pressure stable. Continue Cardizem.  HLD. On statin.   Subjective: No nausea no vomiting.  Appears more fatigue.  Has more shortness of breath.  No pain.  He reports more cough.  Physical Exam: General: in moderate distress, No Rash Cardiovascular: S1 and S2 Present, No Murmur Respiratory: Increased respiratory effort, Bilateral Air entry present. No Crackles, bilateral wheezes Abdomen: Bowel Sound present, No tenderness Extremities: No edema Neuro: Alert and oriented x3, no new focal deficit  Data Reviewed: I have Reviewed nursing notes, Vitals, and Lab results. Since last encounter, pertinent lab results CBC and BMP   . I have ordered test including CBC and BMP and chest x-ray  .   Disposition: Status is: Observation Monitor for improvement in respiratory status. enoxaparin (LOVENOX) injection 40 mg Start: 03/08/23  2200   Family Communication: Wife at bedside Level of care: Med-Surg  Vitals:   03/10/23 0832 03/10/23 1032 03/10/23 1108 03/10/23 1325  BP:    119/66  Pulse:    78  Resp:    20  Temp:    98.3 F (36.8 C)  TempSrc:    Oral  SpO2: 95% (!) 87% 95% 94%  Weight:         Author: Lynden Oxford, MD 03/10/2023 7:55 PM  Please look on [email or web address] to find out who is on call.

## 2023-03-10 NOTE — Plan of Care (Signed)
  Problem: Education: Goal: Knowledge of General Education information will improve Description Including pain rating scale, medication(s)/side effects and non-pharmacologic comfort measures Outcome: Progressing   Problem: Health Behavior/Discharge Planning: Goal: Ability to manage health-related needs will improve Outcome: Progressing   

## 2023-03-11 ENCOUNTER — Telehealth: Payer: Self-pay | Admitting: Pulmonary Disease

## 2023-03-11 DIAGNOSIS — J9601 Acute respiratory failure with hypoxia: Secondary | ICD-10-CM | POA: Diagnosis not present

## 2023-03-11 LAB — GLUCOSE, CAPILLARY
Glucose-Capillary: 167 mg/dL — ABNORMAL HIGH (ref 70–99)
Glucose-Capillary: 200 mg/dL — ABNORMAL HIGH (ref 70–99)

## 2023-03-11 MED ORDER — ALBUTEROL SULFATE HFA 108 (90 BASE) MCG/ACT IN AERS: 2.0000 | INHALATION_SPRAY | Freq: Four times a day (QID) | RESPIRATORY_TRACT | 2 refills | Status: DC | PRN

## 2023-03-11 MED ORDER — HYDROCODONE BIT-HOMATROP MBR 5-1.5 MG/5ML PO SOLN: 5.0000 mL | ORAL | 0 refills | Status: DC | PRN

## 2023-03-11 MED ORDER — PREDNISONE 10 MG PO TABS: 10.0000 mg | ORAL_TABLET | Freq: Every day | ORAL | 0 refills | Status: DC

## 2023-03-11 MED ORDER — PREDNISONE 20 MG PO TABS: 40.0000 mg | ORAL_TABLET | Freq: Every day | ORAL | 0 refills | Status: DC

## 2023-03-11 NOTE — Progress Notes (Signed)
Patient discharged home with home oxygen, discharge paperwork provided and explained to patient as well as patient's wife, both patient and patient's wife verbalized understanding, waiting for arrival of home oxygen to the bedside prior to discharge.

## 2023-03-11 NOTE — Telephone Encounter (Signed)
Saw pt in the hospital -ready for dc on O2, RSV pneumonia Please arrange for follow up in 3-4 weeks with me /APP

## 2023-03-11 NOTE — TOC Transition Note (Signed)
Transition of Care [facility]) - Discharge Note  Patient Details  Name: [name] MRN: [id number] Date of Birth: November 23, 1940  Transition [facility] Care [facility]) CM/SW Contact:  Ewing Schlein, LCSW Phone Number: 03/11/2023, 1:12 PM  Clinical Narrative: Patient will need to go home with home oxygen. Wife reported no DME company preference. CSW made referral to Ashly with [employer], but after running patient's insurance he will need to get oxygen through [employer] or [employer] as these two companies have a contract with the patient's insurance. CSW made DME referral to [facility] with [employer]. [employer] to deliver oxygen to patient's room. CSW updated patient and RN. TOC signing off.  Final next level of care: Home/Self Care Barriers to Discharge: No Barriers Identified  Patient Goals and CMS Choice Patient states their goals for this hospitalization and ongoing recovery are:: home CMS Medicare.gov Compare Post Acute Care list provided to:: Patient Choice offered to / list presented to : Patient [place] ownership interest in [facility].provided to:: Patient   Discharge Plan and Services Additional resources added to the After Visit Summary for   Discharge Planning Services: CM Consult       DME Arranged: Oxygen DME Agency: [employer] Date DME Agency Contacted: 03/11/23 Time DME Agency Contacted: 1148 Representative spoke with at DME Agency: Mitch  Social Drivers of Health (SDOH) Interventions SDOH Screenings   Food Insecurity: No Food Insecurity (03/09/2023)  Housing: Low Risk  (03/09/2023)  Transportation Needs: No Transportation Needs (03/09/2023)  Utilities: Not At Risk (03/09/2023)  Tobacco Use: Medium Risk (03/08/2023)   Readmission Risk Interventions     No data to display

## 2023-03-11 NOTE — Progress Notes (Addendum)
SATURATION QUALIFICATIONS: (This note is used to comply with regulatory documentation for home oxygen)  Patient Saturations on Room Air at Rest = 95%  Patient Saturations on Room Air while Ambulating = 88%  Patient Saturations on 2 Liters of oxygen while Ambulating = 90%  Please briefly explain why patient needs home oxygen: Patient's sats dropped while ambulating.

## 2023-03-11 NOTE — Progress Notes (Signed)
Oxygen delivered to patient at the bedside prior to patient's discharge.

## 2023-03-11 NOTE — Telephone Encounter (Signed)
Pt wife would like if someone would call her. Her husband is currently in the hospital for RSV and is a patient of Dr. Reginia Naas would like to speak to a nurse to discuss if Dr. Vassie Loll can come over to see patient

## 2023-03-11 NOTE — Progress Notes (Signed)
Mobility Specialist - Progress Note   03/11/23 0959  Oxygen Therapy  SpO2 90 %  O2 Device Nasal Cannula  O2 Flow Rate (L/min) 2 L/min  Patient Activity (if Appropriate) Ambulating  Mobility  Activity Ambulated independently in hallway  Level of Assistance Independent  Assistive Device None  Distance Ambulated (ft) 460 ft  Activity Response Tolerated well  Mobility Referral Yes  Mobility visit 1 Mobility  Mobility Specialist Start Time (ACUTE ONLY) 0844  Mobility Specialist Stop Time (ACUTE ONLY) 0857  Mobility Specialist Time Calculation (min) (ACUTE ONLY) 13 min   Pt received in bed and agreeable to mobility. Pt had some SOB & wheezing throughout session. Pt stated feeling more SOB today & fatigued. RN made aware. No other complaints during session. Pt to bed after session with all needs met.    Pre-mobility: 119 HR, 95% SpO2 During mobility: 150 HR, 90% SpO2 Post-mobility: 98% SPO2  Chief Technology Officer

## 2023-03-11 NOTE — Discharge Summary (Addendum)
Physician Discharge Summary  [name] KGM:[id number] DOB: 01/14/1941 DOA: 03/08/2023  PCP: Merri Brunette, MD  Admit date: 03/08/2023 Discharge date: 03/11/2023  Admitted From: Home Disposition:  Home  Discharge Condition:Stable CODE STATUS:FULL Diet recommendation: Heart Healthy  Brief/Interim Summary: Patient is a 82 year old male with PMH of CAD SP CABG, PAF, type II DM, HLD, COPD, prostate cancer presented to hospital with complaints of shortness of breath and cough.Found to have RSV pneumonia.  Started on steroids.  Clinically improved now.  Qualified for home oxygen for 2 L of on ambulation.  Medically stable for discharge today.  Following problems were addressed during the hospitalization:  RSV bronchiolitis/pneumonia. Acute hypoxia Presented with hypoxia , dyspnea.   Procalcitonin negative.  Follow-up chest x-ray shows no acute worsening. Continue steroid and home bronchodilators  History of asthma and COPD. Wheezing resolved No evidence of bacterial infection. Follow-up with own pulmonologist as an outpatient   Paroxysmal A-fib. Currently on aspirin.  Not on any anticoagulation. Rate controlled.   CAD :SP CABG. No anginal symptoms  Type 2 diabetes mellitus. Metformin at home   HTN. Continue Cardizem.  Blood pressure soft so losartan discontinued   HLD. On statin.    Discharge Diagnoses:  Principal Problem:   Respiratory failure with hypoxia secondary to RSV Active Problems:   Asthma-COPD overlap syndrome (HCC)   Paroxysmal atrial fibrillation (HCC)   CAD S/P CABG x 4 in 2008   DM2 (diabetes mellitus, type 2) (HCC)   Essential hypertension   Dyslipidemia   CAD (coronary artery disease)   RSV (respiratory syncytial virus pneumonia)    Discharge Instructions  Discharge Instructions     Diet - low sodium heart healthy   Complete by: As directed    Discharge instructions   Complete by: As directed    1)Please take prescribed medications  as instructed 2)Follow up with your PCP and pulmonologist as an outpatient   Increase activity slowly   Complete by: As directed       Allergies as of 03/11/2023       Reactions   Other Shortness Of Breath, Other (See Comments)   Feline dander   Lisinopril Cough        Medication List     STOP taking these medications    losartan 25 MG tablet Commonly known as: COZAAR       TAKE these medications    albuterol 108 (90 Base) MCG/ACT inhaler Commonly known as: VENTOLIN HFA Inhale 2 puffs into the lungs every 6 (six) hours as needed for wheezing or shortness of breath.   aspirin 81 MG chewable tablet Chew 81 mg by mouth daily.   Biotin 64403 MCG Tabs Take 1 tablet by mouth See admin instructions. Take 10,000 mcg by mouth once a day   diltiazem 180 MG 24 hr capsule Commonly known as: CARDIZEM CD TAKE 1 CAPSULE(180 MG) BY MOUTH DAILY What changed: See the new instructions.   folic acid 1 MG tablet Commonly known as: FOLVITE Take 1 mg by mouth daily.   HYDROcodone bit-homatropine 5-1.5 MG/5ML syrup Commonly known as: HYCODAN Take 5 mLs by mouth every 4 (four) hours as needed for cough.   metFORMIN 500 MG tablet Commonly known as: GLUCOPHAGE Take 500 mg by mouth daily with breakfast.   pimecrolimus 1 % cream Commonly known as: Elidel Apply topically 2 (two) times daily.   pravastatin 40 MG tablet Commonly known as: PRAVACHOL Take 1 tablet (40 mg total) by mouth daily.  predniSONE 10 MG tablet Commonly known as: DELTASONE Take 1 tablet (10 mg total) by mouth daily.   Symbicort 80-4.5 MCG/ACT inhaler Generic drug: budesonide-formoterol INHALE 2 PUFFS BY MOUTH IN THE MORNING AND 2 PUFFS AT BEDTIME What changed: See the new instructions.   triamcinolone cream 0.1 % Commonly known as: KENALOG Apply 1 Application topically 2 (two) times daily as needed (for eczema flares).   VITAMIN B-12 SL Place 1 tablet under the tongue daily.   Vitamin D3 25 MCG  (1000 UT) Caps Take 1,000 Units by mouth daily.               Durable Medical Equipment  (From admission, onward)           Start     Ordered   03/11/23 1031  For home use only DME oxygen  Once       Question Answer Comment  Length of Need 6 Months   Mode or (Route) Nasal cannula   Liters per Minute 2   Frequency Continuous (stationary and portable oxygen unit needed)   Oxygen delivery system Gas      03/11/23 1030            Follow-up Information     Merri Brunette, MD. Schedule an appointment as soon as possible for a visit in 1 week(s).   Specialty: Internal Medicine Contact information: [address] [place] [address] [phone]         Oretha Milch, MD Follow up.   Specialty: Pulmonary Disease Why: you will be called for appointment Contact information: [address] [place] [address] [phone]                Allergies  Allergen Reactions   Other Shortness Of Breath and Other (See Comments)    Feline dander   Lisinopril Cough    Consultations: None   Procedures/Studies: DG CHEST PORT 1 VIEW Result Date: 03/10/2023 CLINICAL DATA:  Shortness of breath. EXAM: PORTABLE CHEST 1 VIEW COMPARISON:  Chest radiograph dated 03/16/2023. FINDINGS: The heart size and mediastinal contours are within normal limits. Vascular calcifications are seen in the aortic arch. There is minimal bibasilar atelectasis. A trace left pleural effusion may contribute. No pneumothorax. Degenerative changes are seen in the spine. IMPRESSION: Minimal bibasilar atelectasis. A trace left pleural effusion may contribute. Electronically Signed   By: Romona Curls M.D.   On: 03/10/2023 17:28   DG Chest Port 1 View Result Date: 03/08/2023 CLINICAL DATA:  Cough. Shortness of breath with congestion for 1 week EXAM: PORTABLE CHEST 1 VIEW COMPARISON:  12/22/2021 FINDINGS: Numerous leads and wires project over the chest. Midline trachea.  Normal heart size. Median sternotomy for CABG. Atherosclerosis in the transverse aorta. No pleural effusion or pneumothorax. Mild lower lung predominant interstitial thickening is likely related to remote smoking history. Scarring along the left heart border. IMPRESSION: No acute cardiopulmonary disease. Aortic Atherosclerosis (ICD10-I70.0). Electronically Signed   By: Jeronimo Greaves M.D.   On: 03/08/2023 11:37      Subjective: Patient seen and examined at bedside today.  Hemodynamically stable.  Remains comfortable.  Any worsening shortness of breath or cough.  Free of wheezing.  Desaturated on ambulation and qualified for 2 L oxygen at home.  Discharge plan discussed with wife at bedside.  Discharge Exam: Vitals:   03/11/23 0959 03/11/23 1013  BP:    Pulse: (!) 120 80  Resp:    Temp:    SpO2:  90% 95%   Vitals:   03/11/23 0609 03/11/23 0708 03/11/23 0959 03/11/23 1013  BP:  116/64    Pulse:  (!) 57 (!) 120 80  Resp:  20    Temp:  97.9 F (36.6 C)    TempSrc:  Oral    SpO2: 100%  90% 95%  Weight:  63 kg    Height:  5\' 6"  (1.676 m)      General: Pt is alert, awake, not in acute distress Cardiovascular: RRR, S1/S2 +, no rubs, no gallops Respiratory: mildly diminished sounds bilaterally,, no wheezing, no rhonchi Abdominal: Soft, NT, ND, bowel sounds + Extremities: no edema, no cyanosis    The results of significant diagnostics from this hospitalization (including imaging, microbiology, ancillary and laboratory) are listed below for reference.     Microbiology: Recent Results (from the past 240 hours)  Resp panel by RT-PCR (RSV, Flu A&B, Covid) Anterior Nasal Swab     Status: Abnormal   Collection Time: 03/08/23 10:36 AM   Specimen: Anterior Nasal Swab  Result Value Ref Range Status   SARS Coronavirus 2 by RT PCR NEGATIVE NEGATIVE Final    Comment: (NOTE) SARS-CoV-2 target nucleic acids are NOT DETECTED.  The SARS-CoV-2 RNA is generally detectable in upper  respiratory specimens during the acute phase of infection. The lowest concentration of SARS-CoV-2 viral copies this assay can detect is 138 copies/mL. A negative result does not preclude SARS-Cov-2 infection and should not be used as the sole basis for treatment or other patient management decisions. A negative result may occur with  improper specimen collection/handling, submission of specimen other than nasopharyngeal swab, presence of viral mutation(s) within the areas targeted by this assay, and inadequate number of viral copies(<138 copies/mL). A negative result must be combined with clinical observations, patient history, and epidemiological information. The expected result is Negative.  Fact Sheet for Patients:  BloggerCourse.com  Fact Sheet for Healthcare Providers:  SeriousBroker.it  This test is no t yet approved or cleared by the [place] FDA and  has been authorized for detection and/or diagnosis of SARS-CoV-2 by FDA under an Emergency Use Authorization (EUA). This EUA will remain  in effect (meaning this test can be used) for the duration of the COVID-19 declaration under Section 564(b)(1) of the Act, 21 U.S.C.section 360bbb-3(b)(1), unless the authorization is terminated  or revoked sooner.       Influenza A by PCR NEGATIVE NEGATIVE Final   Influenza B by PCR NEGATIVE NEGATIVE Final    Comment: (NOTE) The Xpert Xpress SARS-CoV-2/FLU/RSV plus assay is intended as an aid in the diagnosis of influenza from Nasopharyngeal swab specimens and should not be used as a sole basis for treatment. Nasal washings and aspirates are unacceptable for Xpert Xpress SARS-CoV-2/FLU/RSV testing.  Fact Sheet for Patients: BloggerCourse.com  Fact Sheet for Healthcare Providers: SeriousBroker.it  This test is not yet approved or cleared by the [place] FDA and has been  authorized for detection and/or diagnosis of SARS-CoV-2 by FDA under an Emergency Use Authorization (EUA). This EUA will remain in effect (meaning this test can be used) for the duration of the COVID-19 declaration under Section 564(b)(1) of the Act, 21 U.S.C. section 360bbb-3(b)(1), unless the authorization is terminated or revoked.     Resp Syncytial Virus by PCR POSITIVE (A) NEGATIVE Final    Comment: (NOTE) Fact Sheet for Patients: BloggerCourse.com  Fact Sheet for Healthcare Providers: SeriousBroker.it  This test is not yet approved or cleared by the [place] and  has been authorized for detection and/or diagnosis of SARS-CoV-2 by FDA under an Emergency Use Authorization (EUA). This EUA will remain in effect (meaning this test can be used) for the duration of the COVID-19 declaration under Section 564(b)(1) of the Act, 21 U.S.C. section 360bbb-3(b)(1), unless the authorization is terminated or revoked.  Performed at [facility], [address], [place], [place] [address]   Culture, blood (routine x 2)     Status: None (Preliminary result)   Collection Time: 03/08/23 10:42 AM   Specimen: BLOOD  Result Value Ref Range Status   Specimen Description BLOOD RIGHT ANTECUBITAL  Final   Special Requests   Final    BOTTLES DRAWN AEROBIC AND ANAEROBIC Blood Culture adequate volume   Culture   Final    NO GROWTH 3 DAYS Performed at [facility], [address], [place], [place] [address]    Report Status PENDING  Incomplete  Culture, blood (routine x 2)     Status: None (Preliminary result)   Collection Time: 03/08/23 10:47 AM   Specimen: BLOOD  Result Value Ref Range Status   Specimen Description BLOOD LEFT ANTECUBITAL  Final   Special Requests   Final    BOTTLES DRAWN AEROBIC AND ANAEROBIC Blood Culture adequate volume   Culture   Final    NO GROWTH 3 DAYS Performed at [facility], [address] [place], [place] [address]    Report Status PENDING  Incomplete     Labs: BNP (last 3 results) Recent Labs    03/08/23 1105  BNP 439.5*   Basic Metabolic Panel: Recent Labs  Lab 03/08/23 1105 03/08/23 1852 03/09/23 0529 03/10/23 1400  NA 137  --  134* 135  K 4.2  --  3.9 4.2  CL 101  --  107 105  CO2 28  --  20* 21*  GLUCOSE 143*  --  232* 226*  BUN 19  --  24* 33*  CREATININE 0.80  --  0.76 0.81  CALCIUM 9.3  --  8.1* 8.5*  MG  --  1.9  --   --    Liver Function Tests: Recent Labs  Lab 03/10/23 1400  AST 32  ALT 33  ALKPHOS 38  BILITOT 0.6  PROT 6.1*  ALBUMIN 3.2*   No results for input(s): "LIPASE", "AMYLASE" in the last 168 hours. No results for input(s): "AMMONIA" in the last 168 hours. CBC: Recent Labs  Lab 03/08/23 1105 03/09/23 0529 03/10/23 1400  WBC 10.0 9.3 12.7*  NEUTROABS 7.8*  --  11.1*  HGB 13.7 11.3* 11.2*  HCT 39.2 34.0* 33.4*  MCV 87.9 90.9 92.5  PLT 205 169 190   Cardiac Enzymes: No results for input(s): "CKTOTAL", "CKMB", "CKMBINDEX", "TROPONINI" in the last 168 hours. BNP: Invalid input(s): "POCBNP" CBG: Recent Labs  Lab 03/10/23 0812 03/10/23 1206 03/10/23 1638 03/10/23 2141 03/11/23 0743  GLUCAP 116* 167* 248* 193* 167*   D-Dimer No results for input(s): "DDIMER" in the last 72 hours. Hgb A1c Recent Labs    03/09/23 0529  HGBA1C 6.4*   Lipid Profile No results for input(s): "CHOL", "HDL", "LDLCALC", "TRIG", "CHOLHDL", "LDLDIRECT" in the last 72 hours. Thyroid function studies No results for input(s): "TSH", "T4TOTAL", "T3FREE", "THYROIDAB" in the last 72 hours.  Invalid input(s): "FREET3" Anemia work up No results for input(s): "VITAMINB12", "FOLATE", "FERRITIN", "TIBC", "IRON", "RETICCTPCT" in the last 72 hours. Urinalysis No results found for: "COLORURINE", "APPEARANCEUR", "LABSPEC", "PHURINE", "GLUCOSEU", "HGBUR", "BILIRUBINUR", "KETONESUR", "PROTEINUR", "  UROBILINOGEN", "NITRITE",  "LEUKOCYTESUR" Sepsis Labs Recent Labs  Lab 03/08/23 1105 03/09/23 0529 03/10/23 1400  WBC 10.0 9.3 12.7*   Microbiology Recent Results (from the past 240 hours)  Resp panel by RT-PCR (RSV, Flu A&B, Covid) Anterior Nasal Swab     Status: Abnormal   Collection Time: 03/08/23 10:36 AM   Specimen: Anterior Nasal Swab  Result Value Ref Range Status   SARS Coronavirus 2 by RT PCR NEGATIVE NEGATIVE Final    Comment: (NOTE) SARS-CoV-2 target nucleic acids are NOT DETECTED.  The SARS-CoV-2 RNA is generally detectable in upper respiratory specimens during the acute phase of infection. The lowest concentration of SARS-CoV-2 viral copies this assay can detect is 138 copies/mL. A negative result does not preclude SARS-Cov-2 infection and should not be used as the sole basis for treatment or other patient management decisions. A negative result may occur with  improper specimen collection/handling, submission of specimen other than nasopharyngeal swab, presence of viral mutation(s) within the areas targeted by this assay, and inadequate number of viral copies(<138 copies/mL). A negative result must be combined with clinical observations, patient history, and epidemiological information. The expected result is Negative.  Fact Sheet for Patients:  BloggerCourse.com  Fact Sheet for Healthcare Providers:  SeriousBroker.it  This test is no t yet approved or cleared by the [place] FDA and  has been authorized for detection and/or diagnosis of SARS-CoV-2 by FDA under an Emergency Use Authorization (EUA). This EUA will remain  in effect (meaning this test can be used) for the duration of the COVID-19 declaration under Section 564(b)(1) of the Act, 21 U.S.C.section 360bbb-3(b)(1), unless the authorization is terminated  or revoked sooner.       Influenza A by PCR NEGATIVE NEGATIVE Final   Influenza B by PCR NEGATIVE NEGATIVE Final     Comment: (NOTE) The Xpert Xpress SARS-CoV-2/FLU/RSV plus assay is intended as an aid in the diagnosis of influenza from Nasopharyngeal swab specimens and should not be used as a sole basis for treatment. Nasal washings and aspirates are unacceptable for Xpert Xpress SARS-CoV-2/FLU/RSV testing.  Fact Sheet for Patients: BloggerCourse.com  Fact Sheet for Healthcare Providers: SeriousBroker.it  This test is not yet approved or cleared by the [place] FDA and has been authorized for detection and/or diagnosis of SARS-CoV-2 by FDA under an Emergency Use Authorization (EUA). This EUA will remain in effect (meaning this test can be used) for the duration of the COVID-19 declaration under Section 564(b)(1) of the Act, 21 U.S.C. section 360bbb-3(b)(1), unless the authorization is terminated or revoked.     Resp Syncytial Virus by PCR POSITIVE (A) NEGATIVE Final    Comment: (NOTE) Fact Sheet for Patients: BloggerCourse.com  Fact Sheet for Healthcare Providers: SeriousBroker.it  This test is not yet approved or cleared by the [place] FDA and has been authorized for detection and/or diagnosis of SARS-CoV-2 by FDA under an Emergency Use Authorization (EUA). This EUA will remain in effect (meaning this test can be used) for the duration of the COVID-19 declaration under Section 564(b)(1) of the Act, 21 U.S.C. section 360bbb-3(b)(1), unless the authorization is terminated or revoked.  Performed at [facility], [address] [place], [place] [address]   Culture, blood (routine x 2)     Status: None (Preliminary result)   Collection Time: 03/08/23 10:42 AM   Specimen: BLOOD  Result Value Ref Range Status   Specimen Description BLOOD RIGHT ANTECUBITAL  Final   Special Requests   Final  BOTTLES DRAWN AEROBIC AND ANAEROBIC Blood Culture adequate volume    Culture   Final    NO GROWTH 3 DAYS Performed at [facility], [address] [place], [place] [address]    Report Status PENDING  Incomplete  Culture, blood (routine x 2)     Status: None (Preliminary result)   Collection Time: 03/08/23 10:47 AM   Specimen: BLOOD  Result Value Ref Range Status   Specimen Description BLOOD LEFT ANTECUBITAL  Final   Special Requests   Final    BOTTLES DRAWN AEROBIC AND ANAEROBIC Blood Culture adequate volume   Culture   Final    NO GROWTH 3 DAYS Performed at [facility], [address] [place], [place] [address]    Report Status PENDING  Incomplete    Please note: You were cared for by a hospitalist during your hospital stay. Once you are discharged, your primary care physician will handle any further medical issues. Please note that NO REFILLS for any discharge medications will be authorized once you are discharged, as it is imperative that you return to your primary care physician (or establish a relationship with a primary care physician if you do not have one) for your post hospital discharge needs so that they can reassess your need for medications and monitor your lab values.    Time coordinating discharge: 40 minutes  SIGNED:   Burnadette Pop, MD  [facility] 03/11/2023, 11:43 AM Pager [phone]  If 7PM-7AM, please contact night-coverage [email or web address] Password [email or web address]

## 2023-03-12 ENCOUNTER — Encounter: Payer: Self-pay | Admitting: Nurse Practitioner

## 2023-03-12 MED ORDER — PREDNISONE 10 MG PO TABS: ORAL_TABLET | ORAL | 0 refills | Status: DC

## 2023-03-12 NOTE — Progress Notes (Signed)
03/12/2023: Pt's wife, [name], contacted regarding prednisone dosing post discharge. He was sent home with 2 days of 40 mg for a total of 5 days. He is clinically improving but not back to baseline. Will extend prednisone with taper - 30 mg for 3 days, 20 mg for 3 days 10 mg for 3 days then stop. Educated on proper administration and side effects. He's still utilizing supplemental oxygen 2lpm and sats are staying in the mid 90's. Wanted to know if he needed to continue using it and if he needed to sleep with it. Advised her to have him continue supplemental O2 at night and with exertion. Ok to take off when at rest if sats >/90%. Need to reapply if </88%. Verbalized understanding. Will walk at follow up. Follow up is scheduled 03/25/2023. Strict return/ED precautions.

## 2023-03-13 LAB — CULTURE, BLOOD (ROUTINE X 2)
Culture: NO GROWTH
Culture: NO GROWTH
Special Requests: ADEQUATE
Special Requests: ADEQUATE

## 2023-03-14 ENCOUNTER — Telehealth: Payer: Self-pay | Admitting: Pulmonary Disease

## 2023-03-14 DIAGNOSIS — J9601 Acute respiratory failure with hypoxia: Secondary | ICD-10-CM

## 2023-03-14 DIAGNOSIS — B338 Other specified viral diseases: Secondary | ICD-10-CM

## 2023-03-14 NOTE — Telephone Encounter (Signed)
Patient's wife is calling back. Wants a call back on how to ween the patient off of the oxygen since he is doing better. [phone]

## 2023-03-14 NOTE — Telephone Encounter (Signed)
Patient's wife is calling wanting to know how to ween her husband off of oxygen. He was just released from hospital. [phone]

## 2023-03-15 NOTE — Telephone Encounter (Signed)
Pt wife calling in bc she still has not rcvd a call back on how to ween her husband off the oxygen

## 2023-03-18 DIAGNOSIS — Z09 Encounter for follow-up examination after completed treatment for conditions other than malignant neoplasm: Secondary | ICD-10-CM | POA: Diagnosis not present

## 2023-03-18 DIAGNOSIS — I10 Essential (primary) hypertension: Secondary | ICD-10-CM | POA: Diagnosis not present

## 2023-03-19 ENCOUNTER — Telehealth: Payer: Self-pay | Admitting: Pulmonary Disease

## 2023-03-19 NOTE — Telephone Encounter (Signed)
Wife is calling back. She would like to speak with a nurse to have the oxygen removed from her house.

## 2023-03-19 NOTE — Telephone Encounter (Signed)
 Patient's wife called on-call service requesting oxygen be discontinued.  He was recently started on oxygen following admission for RSV.  He has a persistent cough but no significant shortness of breath.  She reports that he has been off oxygen for 24 hours.  She checked his oxygen level after 20 minutes of exertion and states that it was 96%.  Patients wife did not want a wait until appointment with Dr. Jude on Monday, although advised that would be best. Recommend she monitor his oxygen level for the next 24 hours and if continues to stay greater than 90% we can call [employer] on Thursday 1/2 and discontinue oxygen.

## 2023-03-19 NOTE — Telephone Encounter (Signed)
 PT had been in hospital w/RSV and he took home 02. He is not usuing it and his levels have been in the 90's. Does he still need it. If not they will need a order sent to the supplier to come and pick it up.   Wife (On DPR)  was calling. Her # is [phone]

## 2023-03-21 NOTE — Telephone Encounter (Signed)
 Patient wife states he has been off oxygen for 3 days. Oxygen readings have been staying between 95-98% on room air. He reports feeling a lot better. He is walking without oxygen desaturations. He has one more day of prednisone . Patient and wife would like oxygen discontinued, they did not want to wait until follow-up next week.   Please discontinue oxygen with [employer] (order placed). Can you call [employer] and give them a verbal order [phone]

## 2023-03-21 NOTE — Addendum Note (Signed)
 Addended by: Glenford Bayley on: 03/21/2023 11:47 AM   Modules accepted: Orders

## 2023-03-21 NOTE — Telephone Encounter (Signed)
 [name] wife checking on message for removal of oxygen.[name] states [employer] [facility].  [name] phone number is [phone].

## 2023-03-21 NOTE — Telephone Encounter (Signed)
 Order placed by Almarie Ferrari to D/C oxygen.  Followed up with [employer] and our [facility] team.  Chantel has order as urgent order.  Will work on today.  Per [employer], will need faxed order to get oxygen discontinued, cannot get verbal order over phone. Order placed and verified received by Chantel.

## 2023-03-21 NOTE — Telephone Encounter (Signed)
 See telephone contact 03/14/2023 (DUPLICATE REQUEST).  Will close this encounter.

## 2023-03-25 ENCOUNTER — Ambulatory Visit (HOSPITAL_BASED_OUTPATIENT_CLINIC_OR_DEPARTMENT_OTHER): Payer: Medicare Other | Admitting: Pulmonary Disease

## 2023-03-25 ENCOUNTER — Encounter (HOSPITAL_BASED_OUTPATIENT_CLINIC_OR_DEPARTMENT_OTHER): Payer: Self-pay | Admitting: Pulmonary Disease

## 2023-03-25 VITALS — BP 90/60 | HR 71 | Resp 16 | Ht 66.0 in | Wt 142.6 lb

## 2023-03-25 DIAGNOSIS — J441 Chronic obstructive pulmonary disease with (acute) exacerbation: Secondary | ICD-10-CM | POA: Diagnosis not present

## 2023-03-25 DIAGNOSIS — J4489 Other specified chronic obstructive pulmonary disease: Secondary | ICD-10-CM | POA: Diagnosis not present

## 2023-03-25 DIAGNOSIS — J439 Emphysema, unspecified: Secondary | ICD-10-CM

## 2023-03-25 DIAGNOSIS — J121 Respiratory syncytial virus pneumonia: Secondary | ICD-10-CM

## 2023-03-25 NOTE — Progress Notes (Signed)
 Subjective:    Patient ID: [name], male    DOB: Oct 24, 1940, 83 y.o.   MRN: [id number]  HPI  83  yo ex-smoker with COPD He is a retired geophysicist/field seismologist for [employer]  Quit 2008.  43-pack-year smoking history  PMH : CAD SP CABG, PAF, type II DM, prostate cancer   Chief Complaint  Patient presents with   Follow-up    Hospital FU-RSV-- Has been improving. Got off the oxygen Dec 31.  Has not needed albuterol  in past week.    Discussed the use of AI scribe software for clinical note transcription with the patient, who gave verbal consent to proceed.  History of Present Illness   The patient, with a history of COPD, presents for a follow-up after a recent hospitalization for RSV pneumonia. He reports feeling 90% back to normal, with improved breathing and minimal cough. However, he notes becoming winded with exertion, particularly when walking up his steep driveway. He has been monitoring his oxygen levels at home, which have remained in the 90s, and he has returned the supplemental oxygen he was using post-hospitalization. He has completed a course of prednisone  and uses an albuterol  inhaler as needed, approximately 3-4 times a week. He continues to use Symbicort , which he had been on prior to the hospitalization. The patient's spouse also tested positive for RSV but had milder symptoms, primarily nasal congestion.        Significant tests/ events reviewed  CT chest 08/2018 Moderate centrilobular emphysema. Platelike scarring in the inferior right upper lobe    PFT 06/2016: FVC 3.03 L (87%) FEV1 1.61 L (65%) FEV1/FVC 0.53  positive bronchodilator response 02/2016: FVC 2.74 L (78%) FEV1 1.49 L (60%) FEV1/FVC 0.54  positive bronchodilator response 12/2015: FVC 3.23 L (92%) FEV1 1.47 L (58%) FEV1/FVC 0.45  TLC 8.31 L (133%) RV 203% ERV 218% DLCO uncorrected 45%     LABS 02/03/16 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) RAST Panel:  Cat Dander 27.5 / D farinae 9.29 / Dog Dander  6.74 / Multiple Positives IgE:  689  Review of Systems neg for any significant sore throat, dysphagia, itching, sneezing, nasal congestion or excess/ purulent secretions, fever, chills, sweats, unintended wt loss, pleuritic or exertional cp, hempoptysis, orthopnea pnd or change in chronic leg swelling. Also denies presyncope, palpitations, heartburn, abdominal pain, nausea, vomiting, diarrhea or change in bowel or urinary habits, dysuria,hematuria, rash, arthralgias, visual complaints, headache, numbness weakness or ataxia.      Objective:   Physical Exam  Gen. Pleasant, well-nourished, elderly,in no distress ENT - no thrush, no pallor/icterus,no post nasal drip Neck: No JVD, no thyromegaly, no carotid bruits Lungs: no use of accessory muscles, no dullness to percussion, clear without rales or rhonchi  Cardiovascular: Rhythm regular, heart sounds  normal, no murmurs or gallops, no peripheral edema Musculoskeletal: No deformities, no cyanosis or clubbing        Assessment & Plan:     Assessment and Plan Acute resp failure - OK to dc O2 Sat stayed well on walking     RSV Pneumonia Hospitalized with RSV pneumonia. Currently 90% recovered with minimal cough and occasional dyspnea on exertion, particularly when climbing stairs or walking up a steep driveway. Oxygen levels stable in the 90s. Completed prednisone  course. Using albuterol  and Symbicort  inhalers. Discussed risks and benefits of continuing current inhaler regimen versus upgrading to a triple combination inhaler. Patient prefers to continue with Symbicort  for now and reassess after pulmonary rehab. - Continue using  Symbicort  inhaler - Use albuterol  inhaler as needed - Encourage gradual increase in physical activity with monitoring of heart rate and oxygen levels - Refer to pulmonary rehabilitation program for an 8-12 week course  Chronic Obstructive Pulmonary Disease (COPD) Currently using Symbicort . Discussed upgrading to  a triple combination inhaler (Breztri or Trelegy). Explained that Lorraine includes the same medications as Symbicort  plus a third medication, and Trelegy is a once-daily powder inhaler. Patient prefers to continue with Symbicort  and reassess after pulmonary rehab. - Continue Symbicort  inhaler - Reassess inhaler regimen after completion of pulmonary rehabilitation program  Hypertension Off antihypertensive medication since hospitalization. Blood pressure within normal range. Will monitor and reassess need for medication if blood pressure increases. - Monitor blood pressure regularly - Reassess need for antihypertensive medication if blood pressure increases  General Health Maintenance Received RSV vaccination in November 2023. - Encourage continued monitoring of symptoms and use of preventive measures such as wearing a mask if coughing in public  Follow-up - Follow up if no contact from pulmonary rehab program by end of the month - Reassess inhaler regimen after pulmonary rehab - Monitor and report any changes in symptoms or health status.

## 2023-03-25 NOTE — Patient Instructions (Signed)
 Generic option for symbicort  is Breyna - same dose We discussed triple therapy  x refer to pulm rehab

## 2023-03-26 ENCOUNTER — Telehealth (HOSPITAL_COMMUNITY): Payer: Self-pay

## 2023-03-26 NOTE — Telephone Encounter (Signed)
 Called patient to see if he was interested in participating in the Pulmonary Rehab Program. Patient stated yes. Patient will come in for orientation on 04/01/23 @ 1PM and will attend the 10:15AM exercise class. Went over insurance, patient verbalized understanding.   Pensions consultant.

## 2023-03-26 NOTE — Telephone Encounter (Signed)
 Pt insurance is active and benefits verified through [facility]. Co-pay $15.00, DED $0.00/$0.00 met, out of pocket $3,900.00/$0.00 met, co-insurance 0%. No pre-authorization required. Rocky Link B./[employer], 03/26/23 @ 4:25PM, WUJ#[id number]

## 2023-03-27 DIAGNOSIS — Z961 Presence of intraocular lens: Secondary | ICD-10-CM | POA: Diagnosis not present

## 2023-03-27 DIAGNOSIS — H26492 Other secondary cataract, left eye: Secondary | ICD-10-CM | POA: Diagnosis not present

## 2023-04-01 ENCOUNTER — Encounter (HOSPITAL_COMMUNITY)
Admission: RE | Admit: 2023-04-01 | Discharge: 2023-04-01 | Disposition: A | Discharge status: Home / Self Care | Payer: Medicare Other | Source: Ambulatory Visit | Attending: Pulmonary Disease | Admitting: Pulmonary Disease

## 2023-04-01 ENCOUNTER — Encounter (HOSPITAL_COMMUNITY): Payer: Self-pay

## 2023-04-01 VITALS — BP 120/64 | HR 109 | Wt 140.7 lb

## 2023-04-01 DIAGNOSIS — J449 Chronic obstructive pulmonary disease, unspecified: Secondary | ICD-10-CM | POA: Insufficient documentation

## 2023-04-01 LAB — GLUCOSE, CAPILLARY: Glucose-Capillary: 175 mg/dL — ABNORMAL HIGH (ref 70–99)

## 2023-04-01 NOTE — Progress Notes (Signed)
 [name] [place] 83 y.o. male  Initial Psychosocial Assessment  Pt psychosocial assessment reveals pt lives with their spouse. Pt is currently retired. Pt hobbies include watching tv, reading, and fantasy football . Pt reports his  stress level is low. Areas of stress/anxiety include health.  Pt does not exhibit signs of depression. Pt shows good  coping skills with positive outlook . Offered emotional support and reassurance. Monitor and evaluate progress toward psychosocial goal(s).  Goal(s): Improved management of stress Improved coping skills Help patient work toward returning to meaningful activities that improve patient's QOL and are attainable with patient's lung disease   04/01/2023 1:49 PM

## 2023-04-01 NOTE — Progress Notes (Signed)
 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 PHQ 2&9 scores 0/2, pt has great support from wife, former Risk Analyst devices: none

## 2023-04-01 NOTE — Progress Notes (Signed)
 Pulmonary Individual Treatment Plan  Patient Details  Name: [name] MRN: [id number] Date of Birth: [birth year] Referring Provider:   Conrad Ports Pulmonary Rehab Walk Test from 04/01/2023 in [facility]  Referring Provider Jude       Initial Encounter Date:  Flowsheet Row Pulmonary Rehab Walk Test from 04/01/2023 in [facility]  Date 04/01/23       Visit Diagnosis: Stage 2 moderate COPD by GOLD classification (HCC)  Patient's Home Medications on Admission:   Current Outpatient Medications:    albuterol  (VENTOLIN  HFA) 108 (90 Base) MCG/ACT inhaler, Inhale 2 puffs into the lungs every 6 (six) hours as needed for wheezing or shortness of breath., Disp: 8 g, Rfl: 2   aspirin  81 MG chewable tablet, Chew 81 mg by mouth daily., Disp: , Rfl:    Biotin 89999 MCG TABS, Take 1 tablet by mouth See admin instructions. Take 10,000 mcg by mouth once a day, Disp: , Rfl:    Cholecalciferol (VITAMIN D3) 25 MCG (1000 UT) CAPS, Take 1,000 Units by mouth daily., Disp: , Rfl:    Cyanocobalamin (VITAMIN B-12 SL), Place 1 tablet under the tongue daily., Disp: , Rfl:    diltiazem  (CARDIZEM  CD) 180 MG 24 hr capsule, TAKE 1 CAPSULE(180 MG) BY MOUTH DAILY (Patient taking differently: Take 180 mg by mouth in the morning.), Disp: 90 capsule, Rfl: 0   folic acid  (FOLVITE ) 1 MG tablet, Take 1 mg by mouth daily., Disp: , Rfl:    HYDROcodone  bit-homatropine (HYCODAN) 5-1.5 MG/5ML syrup, Take 5 mLs by mouth every 4 (four) hours as needed for cough., Disp: 120 mL, Rfl: 0   metFORMIN (GLUCOPHAGE) 500 MG tablet, Take 500 mg by mouth daily with breakfast., Disp: , Rfl:    pimecrolimus  (ELIDEL ) 1 % cream, Apply topically 2 (two) times daily., Disp: 100 g, Rfl: 2   pravastatin  (PRAVACHOL ) 40 MG tablet, Take 1 tablet (40 mg total) by mouth daily., Disp: 90 tablet, Rfl: 3   SYMBICORT  80-4.5 MCG/ACT  inhaler, INHALE 2 PUFFS BY MOUTH IN THE MORNING AND 2 PUFFS AT BEDTIME (Patient taking differently: Inhale 2 puffs into the lungs in the morning and at bedtime.), Disp: 10.2 g, Rfl: 6   triamcinolone  cream (KENALOG) 0.1 %, Apply 1 Application topically 2 (two) times daily as needed (for eczema flares)., Disp: , Rfl:   Past Medical History: 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   02/03/2016-respiratory allergy  panel, environmental allergens to dust mites, cat dander, dog dander, Timothy grass, cockroach, fungus, cedar trees, ragweed, rough pigweed, mouse urine, IgE is 689 >>>Largest elevations are to cat dander, dog dander, dust mites  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)  06/27/2016-pulmonary function test-FVC 3.03 (87% predicted), postbronchodilator ratio 54, postbronc   Basal cell carcinoma 08/20/2016   left cheek-cx3 excision   BCC (basal cell carcinoma) 09/20/2016   left cheek-mohs   CAD (coronary artery disease)    cath & CABG in 2008   Cardiomyopathy, 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   COPD (  chronic obstructive pulmonary disease) (HCC)    COPD with acute exacerbation (HCC) 04/02/2018   Diabetes (HCC)    type 2   DM2 (diabetes mellitus, type 2) (HCC) 02/05/2013   Dyslipidemia    Environmental allergies 02/03/2016   02/03/2016-respiratory allergy  panel, environmental allergens to dust mites, cat dander, dog dander, Timothy grass, cockroach, fungus, cedar trees, ragweed, rough pigweed, mouse urine, IgE  is 689 >>>Largest elevations are to cat dander, dog dander, dust mites    Essential hypertension 02/03/2016   Former smoker 05/14/2018   Former smoker Quit 2008 43-pack-year smoking history   History of nuclear stress test 10/08/2007   bruce myoview ; normal scan with attenuation artifact; no ischemia/infarct; low risk    Hypertriglyceridemia 02/03/2016   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 Obadiah - LIMA to LAD, sequential saphenous vein graft to OM1 and OM 2, SVG to posterior descending (done emergently)     Tobacco Use: Social History   Tobacco Use  Smoking Status Former   Current packs/day: 0.00   Average packs/day: 1 pack/day for 43.1 years (43.1 ttl pk-yrs)   Types: Cigarettes   Start date: 12/25/1963   Quit date: 01/31/2007   Years since quitting: 16.1  Smokeless Tobacco Never    Labs: Review Flowsheet       Latest Ref Rng & Units 09/16/2015 03/09/2023  Labs for ITP Cardiac and Pulmonary Rehab  Hemoglobin A1c 4.8 - 5.6 % - 6.4   TCO2 0 - 100 mmol/L 26  -    Capillary Blood Glucose: Lab Results  Component Value Date   GLUCAP 175 (H) 04/01/2023   GLUCAP 200 (H) 03/11/2023   GLUCAP 167 (H) 03/11/2023   GLUCAP 193 (H) 03/10/2023   GLUCAP 248 (H) 03/10/2023    POCT Glucose     Row Name 04/01/23 1343             POCT Blood Glucose   Pre-Exercise 175 mg/dL                Pulmonary Assessment Scores:  Pulmonary Assessment Scores     Row Name 04/01/23 1343         ADL UCSD   ADL Phase Entry     SOB Score total 14       CAT Score   CAT Score 10       mMRC Score   mMRC Score 2             UCSD: Self-administered rating of dyspnea associated with activities of daily living (ADLs) 6-point scale (0 = not at all to 5 = maximal or unable to do because of breathlessness)  Scoring Scores range from 0 to  120.  Minimally important difference is 5 units  CAT: CAT can identify the health impairment of COPD patients and is better correlated with disease progression.  CAT has a scoring range of zero to 40. The CAT score is classified into four groups of low (less than 10), medium (10 - 20), high (21-30) and very high (31-40) based on the impact level of disease on health status. A CAT score over 10 suggests significant symptoms.  A worsening CAT score could be explained by an exacerbation, poor medication adherence, poor inhaler technique, or progression of COPD or comorbid conditions.  CAT MCID is 2 points  mMRC: mMRC (Modified Medical Research Council) Dyspnea Scale is used to assess the degree of baseline functional disability in patients of respiratory disease due to dyspnea. No minimal important difference is established. A decrease in score of 1 point or greater is considered a positive change.   Pulmonary Function Assessment:  Pulmonary Function Assessment - 04/01/23 1322       Breath   Bilateral Breath Sounds Decreased    Shortness of Breath Yes;Limiting activity             Exercise Target Goals: Exercise Program Goal: Individual exercise prescription set using results from initial 6 min walk test and THRR while considering  patient's activity barriers and safety.   Exercise Prescription Goal: Initial exercise prescription builds to 30-45 minutes a day of aerobic activity, 2-3 days per week.  Home exercise guidelines will be given to patient during program as part of exercise prescription that the participant will acknowledge.  Activity Barriers & Risk Stratification:  Activity Barriers & Cardiac Risk Stratification - 04/01/23 1321       Activity Barriers & Cardiac Risk Stratification   Activity Barriers Deconditioning;Muscular Weakness;Shortness of Breath;Balance Concerns;History of Falls    Cardiac Risk Stratification Moderate             6 Minute Walk:  6 Minute  Walk     Row Name 04/01/23 1408         6 Minute Walk   Phase Initial     Distance 1254 feet     Walk Time 6 minutes     # of Rest Breaks 0     MPH 2.38     METS 2.46     RPE 11     Perceived Dyspnea  1     VO2 Peak 8.6     Symptoms No     Resting HR 109 bpm     Resting BP 120/64     Resting Oxygen Saturation  96 %     Exercise Oxygen Saturation  during 6 min walk 92 %     Max Ex. HR 2.4 bpm     Max Ex. BP 120/50     2 Minute Post BP 110/50       Interval HR   1 Minute HR 123     2 Minute HR 123     3 Minute HR 111     4 Minute HR 114     5 Minute HR 122     6 Minute HR 126     2 Minute Post HR 93     Interval Heart Rate? Yes       Interval Oxygen   Interval Oxygen? Yes     Baseline Oxygen Saturation % 96 %     1 Minute Oxygen Saturation % 92 %     1 Minute Liters of Oxygen 0 L     2 Minute Oxygen Saturation % 95 %     2 Minute Liters of Oxygen 0 L     3 Minute Oxygen Saturation % 95 %     3 Minute Liters of Oxygen 0 L     4 Minute Oxygen Saturation % 94 %     4 Minute Liters of Oxygen 0 L     5 Minute Oxygen Saturation % 94 %     5 Minute Liters of Oxygen 0 L     6 Minute Oxygen Saturation % 94 %     6 Minute Liters  of Oxygen 0 L     2 Minute Post Oxygen Saturation % 95 %     2 Minute Post Liters of Oxygen 0 L              Oxygen Initial Assessment:  Oxygen Initial Assessment - 04/01/23 1322       Home Oxygen   Home Oxygen Device None    Home Exercise Oxygen Prescription None    Home Resting Oxygen Prescription None      Initial 6 min Walk   Oxygen Used None      Program Oxygen Prescription   Program Oxygen Prescription None      Intervention   Short Term Goals To learn and understand importance of monitoring SPO2 with pulse oximeter and demonstrate accurate use of the pulse oximeter.;To learn and understand importance of maintaining oxygen saturations>88%;To learn and demonstrate proper pursed lip breathing techniques or other breathing  techniques. ;To learn and demonstrate proper use of respiratory medications    Long  Term Goals Maintenance of O2 saturations>88%;Compliance with respiratory medication;Verbalizes importance of monitoring SPO2 with pulse oximeter and return demonstration;Exhibits proper breathing techniques, such as pursed lip breathing or other method taught during program session;Demonstrates proper use of MDI's             Oxygen Re-Evaluation:   Oxygen Discharge (Final Oxygen Re-Evaluation):   Initial Exercise Prescription:  Initial Exercise Prescription - 04/01/23 1400       Date of Initial Exercise RX and Referring Provider   Date 04/01/23    Referring Provider Jude    Expected Discharge Date 06/27/23      Recumbant Bike   Level 2    RPM 60    Minutes 15    METs 2.2      Recumbant Elliptical   Level 2    RPM 60    Minutes 15    METs 2.2      Prescription Details   Frequency (times per week) 2    Duration Progress to 30 minutes of continuous aerobic without signs/symptoms of physical distress      Intensity   THRR 40-80% of Max Heartrate 54-109    Ratings of Perceived Exertion 11-13    Perceived Dyspnea 0-4      Progression   Progression Continue progressive overload as per policy without signs/symptoms or physical distress.      Resistance Training   Training Prescription Yes    Weight red bands    Reps 10-15             Perform Capillary Blood Glucose checks as needed.  Exercise Prescription Changes:   Exercise Comments:   Exercise Goals and Review:   Exercise Goals     Row Name 04/01/23 1321             Exercise Goals   Increase Physical Activity Yes       Intervention Provide advice, education, support and counseling about physical activity/exercise needs.;Develop an individualized exercise prescription for aerobic and resistive training based on initial evaluation findings, risk stratification, comorbidities and participant's personal goals.        Expected Outcomes Short Term: Attend rehab on a regular basis to increase amount of physical activity.;Long Term: Add in home exercise to make exercise part of routine and to increase amount of physical activity.;Long Term: Exercising regularly at least 3-5 days a week.       Increase Strength and Stamina Yes       Intervention  Provide advice, education, support and counseling about physical activity/exercise needs.;Develop an individualized exercise prescription for aerobic and resistive training based on initial evaluation findings, risk stratification, comorbidities and participant's personal goals.       Expected Outcomes Short Term: Increase workloads from initial exercise prescription for resistance, speed, and METs.;Short Term: Perform resistance training exercises routinely during rehab and add in resistance training at home;Long Term: Improve cardiorespiratory fitness, muscular endurance and strength as measured by increased METs and functional capacity ( )       Able to understand and use rate of perceived exertion (RPE) scale Yes       Intervention Provide education and explanation on how to use RPE scale       Expected Outcomes Short Term: Able to use RPE daily in rehab to express subjective intensity level;Long Term:  Able to use RPE to guide intensity level when exercising independently       Able to understand and use Dyspnea scale Yes       Intervention Provide education and explanation on how to use Dyspnea scale       Expected Outcomes Short Term: Able to use Dyspnea scale daily in rehab to express subjective sense of shortness of breath during exertion;Long Term: Able to use Dyspnea scale to guide intensity level when exercising independently       Knowledge and understanding of Target Heart Rate Range (THRR) Yes       Intervention Provide education and explanation of THRR including how the numbers were predicted and where they are located for reference       Expected Outcomes  Short Term: Able to state/look up THRR;Long Term: Able to use THRR to govern intensity when exercising independently;Short Term: Able to use daily as guideline for intensity in rehab       Understanding of Exercise Prescription Yes       Intervention Provide education, explanation, and written materials on patient's individual exercise prescription       Expected Outcomes Short Term: Able to explain program exercise prescription;Long Term: Able to explain home exercise prescription to exercise independently                Exercise Goals Re-Evaluation :   Discharge Exercise Prescription (Final Exercise Prescription Changes):   Nutrition:  Target Goals: Understanding of nutrition guidelines, daily intake of sodium 1500mg , cholesterol 200mg , calories 30% from fat and 7% or less from saturated fats, daily to have 5 or more servings of fruits and vegetables.  Biometrics:  Pre Biometrics - 04/01/23 1436       Pre Biometrics   Grip Strength 22 kg              Nutrition Therapy Plan and Nutrition Goals:   Nutrition Assessments:  MEDIFICTS Score Key: >=70 Need to make dietary changes  40-70 Heart Healthy Diet <= 40 Therapeutic Level Cholesterol Diet   Picture Your Plate Scores: <59 Unhealthy dietary pattern with much room for improvement. 41-50 Dietary pattern unlikely to meet recommendations for good health and room for improvement. 51-60 More healthful dietary pattern, with some room for improvement.  >60 Healthy dietary pattern, although there may be some specific behaviors that could be improved.    Nutrition Goals Re-Evaluation:   Nutrition Goals Discharge (Final Nutrition Goals Re-Evaluation):   Psychosocial: Target Goals: Acknowledge presence or absence of significant depression and/or stress, maximize coping skills, provide positive support system. Participant is able to verbalize types and ability to use techniques and skills needed for  reducing stress and  depression.  Initial Review & Psychosocial Screening:  Initial Psych Review & Screening - 04/01/23 1318       Initial Review   Current issues with None Identified      Family Dynamics   Good Support System? Yes    Comments Pts wife is a former [employer]. She is good support for pt.      Barriers   Psychosocial barriers to participate in program There are no identifiable barriers or psychosocial needs.      Screening Interventions   Interventions Encouraged to exercise    Expected Outcomes Short Term goal: Utilizing psychosocial counselor, staff and physician to assist with identification of specific Stressors or current issues interfering with healing process. Setting desired goal for each stressor or current issue identified.;Long Term Goal: Stressors or current issues are controlled or eliminated.;Short Term goal: Identification and review with participant of any Quality of Life or Depression concerns found by scoring the questionnaire.;Long Term goal: The participant improves quality of Life and PHQ9 Scores as seen by post scores and/or verbalization of changes             Quality of Life Scores:  Scores of 19 and below usually indicate a poorer quality of life in these areas.  A difference of  2-3 points is a clinically meaningful difference.  A difference of 2-3 points in the total score of the Quality of Life Index has been associated with significant improvement in overall quality of life, self-image, physical symptoms, and general health in studies assessing change in quality of life.  PHQ-9: Review Flowsheet       04/01/2023  Depression screen PHQ 2/9  Decreased Interest 0  Down, Depressed, Hopeless 0  PHQ - 2 Score 0  Altered sleeping 1  Tired, decreased energy 1  Change in appetite 0  Feeling bad or failure about yourself  0  Trouble concentrating 0  Moving slowly or fidgety/restless 0  Suicidal thoughts 0  PHQ-9 Score 2  Difficult doing work/chores Somewhat  difficult   Interpretation of Total Score  Total Score Depression Severity:  1-4 = Minimal depression, 5-9 = Mild depression, 10-14 = Moderate depression, 15-19 = Moderately severe depression, 20-27 = Severe depression   Psychosocial Evaluation and Intervention:  Psychosocial Evaluation - 04/01/23 1319       Psychosocial Evaluation & Interventions   Interventions Encouraged to exercise with the program and follow exercise prescription    Comments Shafer denies any psychosocial barriers or concerns at this time.    Expected Outcomes For Kenshawn to participate in PR free of any psychosocial barriers or conerns    Continue Psychosocial Services  No Follow up required             Psychosocial Re-Evaluation:   Psychosocial Discharge (Final Psychosocial Re-Evaluation):   Education: Education Goals: Education classes will be provided on a weekly basis, covering required topics. Participant will state understanding/return demonstration of topics presented.  Learning Barriers/Preferences:  Learning Barriers/Preferences - 04/01/23 1319       Learning Barriers/Preferences   Learning Barriers Hearing    Learning Preferences Group Instruction;Individual Instruction;Written Material             Education Topics: Know Your Numbers Group instruction that is supported by a PowerPoint presentation. Instructor discusses importance of knowing and understanding resting, exercise, and post-exercise oxygen saturation, heart rate, and blood pressure. Oxygen saturation, heart rate, blood pressure, rating of perceived exertion, and dyspnea are reviewed along with a  normal range for these values.    Exercise for the Pulmonary Patient Group instruction that is supported by a PowerPoint presentation. Instructor discusses benefits of exercise, core components of exercise, frequency, duration, and intensity of an exercise routine, importance of utilizing pulse oximetry during exercise, safety  while exercising, and options of places to exercise outside of rehab.    MET Level  Group instruction provided by PowerPoint, verbal discussion, and written material to support subject matter. Instructor reviews what METs are and how to increase METs.    Pulmonary Medications Verbally interactive group education provided by instructor with focus on inhaled medications and proper administration.   Anatomy and Physiology of the Respiratory System Group instruction provided by PowerPoint, verbal discussion, and written material to support subject matter. Instructor reviews respiratory cycle and anatomical components of the respiratory system and their functions. Instructor also reviews differences in obstructive and restrictive respiratory diseases with examples of each.    Oxygen Safety Group instruction provided by PowerPoint, verbal discussion, and written material to support subject matter. There is an overview of "What is Oxygen" and "Why do we need it".  Instructor also reviews how to create a safe environment for oxygen use, the importance of using oxygen as prescribed, and the risks of noncompliance. There is a brief discussion on traveling with oxygen and resources the patient may utilize.   Oxygen Use Group instruction provided by PowerPoint, verbal discussion, and written material to discuss how supplemental oxygen is prescribed and different types of oxygen supply systems. Resources for more information are provided.    Breathing Techniques Group instruction that is supported by demonstration and informational handouts. Instructor discusses the benefits of pursed lip and diaphragmatic breathing and detailed demonstration on how to perform both.     Risk Factor Reduction Group instruction that is supported by a PowerPoint presentation. Instructor discusses the definition of a risk factor, different risk factors for pulmonary disease, and how the heart and lungs work  together.   Pulmonary Diseases Group instruction provided by PowerPoint, verbal discussion, and written material to support subject matter. Instructor gives an overview of the different type of pulmonary diseases. There is also a discussion on risk factors and symptoms as well as ways to manage the diseases.   Stress and Energy Conservation Group instruction provided by PowerPoint, verbal discussion, and written material to support subject matter. Instructor gives an overview of stress and the impact it can have on the body. Instructor also reviews ways to reduce stress. There is also a discussion on energy conservation and ways to conserve energy throughout the day.   Warning Signs and Symptoms Group instruction provided by PowerPoint, verbal discussion, and written material to support subject matter. Instructor reviews warning signs and symptoms of stroke, heart attack, cold and flu. Instructor also reviews ways to prevent the spread of infection.   Other Education Group or individual verbal, written, or video instructions that support the educational goals of the pulmonary rehab program.    Knowledge Questionnaire Score:  Knowledge Questionnaire Score - 04/01/23 1344       Knowledge Questionnaire Score   Pre Score 16/18             Core Components/Risk Factors/Patient Goals at Admission:  Personal Goals and Risk Factors at Admission - 04/01/23 1320       Core Components/Risk Factors/Patient Goals on Admission   Improve shortness of breath with ADL's Yes    Intervention Provide education, individualized exercise plan and daily activity instruction to  help decrease symptoms of SOB with activities of daily living.    Expected Outcomes Short Term: Improve cardiorespiratory fitness to achieve a reduction of symptoms when performing ADLs;Long Term: Be able to perform more ADLs without symptoms or delay the onset of symptoms    Increase knowledge of respiratory medications and  ability to use respiratory devices properly  Yes    Expected Outcomes Short Term: Achieves understanding of medications use. Understands that oxygen is a medication prescribed by physician. Demonstrates appropriate use of inhaler and oxygen therapy.;Long Term: Maintain appropriate use of medications, inhalers, and oxygen therapy.             Core Components/Risk Factors/Patient Goals Review:    Core Components/Risk Factors/Patient Goals at Discharge (Final Review):    ITP Comments:   Comments: Dr. Slater Staff is Medical Director for Pulmonary Rehab at [facility].

## 2023-04-01 NOTE — Progress Notes (Signed)
 [name] [place] 83 y.o. male Pulmonary Rehab Orientation Note This patient who was referred to Pulmonary Rehab by Dr. Jude with the diagnosis of COPD 2 arrived today in Cardiac and Pulmonary Rehab. He arrived ambulatory with normal gait. He does not carry portable oxygen.  Per patient, [name] uses oxygen never. Color good, skin warm and dry. Patient is oriented to time and place. Patient's medical history, psychosocial health, and medications reviewed. Psychosocial assessment reveals patient lives with spouse. [name] is currently retired. Patient hobbies include watching tv, reading, and fantasy football . Patient reports his stress level is low. Areas of stress/anxiety include health. Patient does not exhibit signs of depression. PHQ2/9 score 0/2. [name] shows good  coping skills with positive outlook on life. Offered emotional support and reassurance. Will continue to monitor. Physical assessment performed by Nurse pick: [name]. Please see their orientation physical assessment note. [name] reports he  does take medications as prescribed. Patient states he  follows a regular  diet. The patient reports no specific efforts to gain or lose weight.. Patient's weight will be monitored closely. Demonstration and practice of PLB using pulse oximeter. [name] able to return demonstration satisfactorily. Safety and hand hygiene in the exercise area reviewed with patient. [name] voices understanding of the information reviewed. Department expectations discussed with patient and achievable goals were set. The patient shows enthusiasm about attending the program and we look forward to working with Jonathan. [name] completed a 6 min walk test today and is scheduled to begin exercise on 04/09/23@ 10:15.   1245-1400 [name], BSRT

## 2023-04-09 ENCOUNTER — Encounter (HOSPITAL_COMMUNITY)
Admission: RE | Admit: 2023-04-09 | Discharge: 2023-04-09 | Disposition: A | Discharge status: Home / Self Care | Payer: Medicare Other | Source: Ambulatory Visit | Attending: Pulmonary Disease | Admitting: Pulmonary Disease

## 2023-04-09 ENCOUNTER — Telehealth (HOSPITAL_COMMUNITY): Payer: Self-pay

## 2023-04-09 VITALS — Wt 141.3 lb

## 2023-04-09 DIAGNOSIS — J449 Chronic obstructive pulmonary disease, unspecified: Secondary | ICD-10-CM

## 2023-04-09 LAB — GLUCOSE, CAPILLARY
Glucose-Capillary: 135 mg/dL — ABNORMAL HIGH (ref 70–99)
Glucose-Capillary: 103 mg/dL — ABNORMAL HIGH (ref 70–99)

## 2023-04-09 NOTE — Telephone Encounter (Signed)
-----   Message from Peconic V. Alva sent at 04/09/2023  2:32 PM EST ----- Ok with me ----- Message ----- From: Joya San Sent: 04/09/2023  12:08 PM EST To: Oretha Milch, MD  Dr. Vassie Loll,  I am requesting a target heart rate range increase for [name]. His new target heart rate range would be 54-125. Please advise.   Thanks [employer]

## 2023-04-09 NOTE — Progress Notes (Signed)
Daily Session Note  Patient Details  Name: [name] MRN: [id number] Date of Birth: Apr 13, 1940 Referring Provider:   Doristine Devoid Pulmonary Rehab Walk Test from 04/01/2023 in [facility]: [facility]  Referring Provider Vassie Loll       Encounter Date: 04/09/2023  Check In:  Session Check In - 04/09/23 1129       Check-In   Supervising physician immediately available to respond to emergencies [facility] MD immediately available    Physician(s) Bernadene Person, NP    Location [facility]    Staff Present Essie Hart, RN, BSN;Casey Katrinka Blazing, Zella Richer, MS, ACSM-CEP, Exercise Physiologist;Johnny Hale Bogus, MS, Exercise Physiologist    Virtual Visit No    Medication changes reported     No    Fall or balance concerns reported    No    Tobacco Cessation No Change    Warm-up and Cool-down Performed as group-led instruction    Resistance Training Performed Yes    VAD Patient? No    PAD/SET Patient? No      Pain Assessment   Currently in Pain? No/denies    Multiple Pain Sites No             Capillary Blood Glucose: Results for orders placed or performed during the hospital encounter of 04/01/23 (from the past 24 hours)  Glucose, capillary     Status: Abnormal   Collection Time: 04/09/23 10:19 AM  Result Value Ref Range   Glucose-Capillary 135 (H) 70 - 99 mg/dL  Glucose, capillary     Status: Abnormal   Collection Time: 04/09/23 11:41 AM  Result Value Ref Range   Glucose-Capillary 103 (H) 70 - 99 mg/dL     Exercise Prescription Changes - 04/09/23 1200       Response to Exercise   Blood Pressure (Admit) 110/70    Blood Pressure (Exercise) 116/66    Blood Pressure (Exit) 112/70    Heart Rate (Admit) 95 bpm    Heart Rate (Exercise) 117 bpm    Heart Rate (Exit) 85 bpm    Oxygen Saturation (Admit) 98 %    Oxygen Saturation (Exercise) 97 %    Oxygen Saturation (Exit) 98 %    Rating of Perceived Exertion (Exercise)  11    Perceived Dyspnea (Exercise) 0    Duration Continue with 30 min of aerobic exercise without signs/symptoms of physical distress.    Intensity THRR unchanged      Progression   Progression Continue to progress workloads to maintain intensity without signs/symptoms of physical distress.      Resistance Training   Training Prescription Yes    Weight red bands    Reps 10-15    Time 10 Minutes      Recumbant Bike   Level 2    RPM 42    Watts 13    Minutes 15    METs 2      Recumbant Elliptical   Level 2    Minutes 15    METs 3.1             Social History   Tobacco Use  Smoking Status Former   Current packs/day: 0.00   Average packs/day: 1 pack/day for 43.1 years (43.1 ttl pk-yrs)   Types: Cigarettes   Start date: 12/25/1963   Quit date: 01/31/2007   Years since quitting: 16.1  Smokeless Tobacco Never    Goals Met:  Proper associated with RPD/PD & O2  Sat Exercise tolerated well No report of concerns or symptoms today Strength training completed today  Goals Unmet:  Not Applicable  Comments: Service time is from 1013 to 1133.    Dr. Mechele Collin is Medical Director for Pulmonary Rehab at [facility].

## 2023-04-10 NOTE — Progress Notes (Signed)
Pulmonary Individual Treatment Plan  Patient Details  Name: [name] MRN: [id number] Date of Birth: 08-23-1940 Referring Provider:   Doristine Devoid Pulmonary Rehab Walk Test from 04/01/2023 in [facility]  Referring Provider Vassie Loll       Initial Encounter Date:  Flowsheet Row Pulmonary Rehab Walk Test from 04/01/2023 in [facility], Vascular, [facility] Lung [facility]  Date 04/01/23       Visit Diagnosis: Stage 2 moderate COPD by GOLD classification (HCC)  Patient's Home Medications on Admission:   Current Outpatient Medications:    albuterol (VENTOLIN HFA) 108 (90 Base) MCG/ACT inhaler, Inhale 2 puffs into the lungs every 6 (six) hours as needed for wheezing or shortness of breath., Disp: 8 g, Rfl: 2   aspirin 81 MG chewable tablet, Chew 81 mg by mouth daily., Disp: , Rfl:    Biotin 59563 MCG TABS, Take 1 tablet by mouth See admin instructions. Take 10,000 mcg by mouth once a day, Disp: , Rfl:    Cholecalciferol (VITAMIN D3) 25 MCG (1000 UT) CAPS, Take 1,000 Units by mouth daily., Disp: , Rfl:    Cyanocobalamin (VITAMIN B-12 SL), Place 1 tablet under the tongue daily., Disp: , Rfl:    diltiazem (CARDIZEM CD) 180 MG 24 hr capsule, TAKE 1 CAPSULE(180 MG) BY MOUTH DAILY (Patient taking differently: Take 180 mg by mouth in the morning.), Disp: 90 capsule, Rfl: 0   folic acid (FOLVITE) 1 MG tablet, Take 1 mg by mouth daily., Disp: , Rfl:    HYDROcodone bit-homatropine (HYCODAN) 5-1.5 MG/5ML syrup, Take 5 mLs by mouth every 4 (four) hours as needed for cough., Disp: 120 mL, Rfl: 0   metFORMIN (GLUCOPHAGE) 500 MG tablet, Take 500 mg by mouth daily with breakfast., Disp: , Rfl:    pimecrolimus (ELIDEL) 1 % cream, Apply topically 2 (two) times daily., Disp: 100 g, Rfl: 2   pravastatin (PRAVACHOL) 40 MG tablet, Take 1 tablet (40 mg total) by mouth daily., Disp: 90 tablet, Rfl: 3   SYMBICORT 80-4.5 MCG/ACT  inhaler, INHALE 2 PUFFS BY MOUTH IN THE MORNING AND 2 PUFFS AT BEDTIME (Patient taking differently: Inhale 2 puffs into the lungs in the morning and at bedtime.), Disp: 10.2 g, Rfl: 6   triamcinolone cream (KENALOG) 0.1 %, Apply 1 Application topically 2 (two) times daily as needed (for eczema flares)., Disp: , Rfl:   Past Medical History: 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   02/03/2016-respiratory allergy panel, environmental allergens to dust mites, cat dander, dog dander, Timothy grass, cockroach, fungus, cedar trees, ragweed, rough pigweed, mouse urine, IgE is 689 >>>Largest elevations are to cat dander, dog dander, dust mites  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)  06/27/2016-pulmonary function test-FVC 3.03 (87% predicted), postbronchodilator ratio 54, postbronc   Basal cell carcinoma 08/20/2016   left cheek-cx3 excision   BCC (basal cell carcinoma) 09/20/2016   left cheek-mohs   CAD (coronary artery disease)    cath & CABG in 2008   Cardiomyopathy, 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   COPD (  chronic obstructive pulmonary disease) (HCC)    COPD with acute exacerbation (HCC) 04/02/2018   Diabetes (HCC)    type 2   DM2 (diabetes mellitus, type 2) (HCC) 02/05/2013   Dyslipidemia    Environmental allergies 02/03/2016   02/03/2016-respiratory allergy panel, environmental allergens to dust mites, cat dander, dog dander, Timothy grass, cockroach, fungus, cedar trees, ragweed, rough pigweed, mouse urine, IgE  is 689 >>>Largest elevations are to cat dander, dog dander, dust mites    Essential hypertension 02/03/2016   Former smoker 05/14/2018   Former smoker Quit 2008 43-pack-year smoking history   History of nuclear stress test 10/08/2007   bruce myoview; normal scan with attenuation artifact; no ischemia/infarct; low risk    Hypertriglyceridemia 02/03/2016   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 Maren Beach - [place] to LAD, sequential saphenous vein graft to OM1 and OM 2, SVG to posterior descending (done emergently)     Tobacco Use: Social History   Tobacco Use  Smoking Status Former   Current packs/day: 0.00   Average packs/day: 1 pack/day for 43.1 years (43.1 ttl pk-yrs)   Types: Cigarettes   Start date: 12/25/1963   Quit date: 01/31/2007   Years since quitting: 16.2  Smokeless Tobacco Never    Labs: Review Flowsheet       Latest Ref Rng & Units 09/16/2015 03/09/2023  Labs for ITP Cardiac and Pulmonary Rehab  Hemoglobin A1c 4.8 - 5.6 % - 6.4   TCO2 0 - 100 mmol/L 26  -    Capillary Blood Glucose: Lab Results  Component Value Date   GLUCAP 103 (H) 04/09/2023   GLUCAP 135 (H) 04/09/2023   GLUCAP 175 (H) 04/01/2023   GLUCAP 200 (H) 03/11/2023   GLUCAP 167 (H) 03/11/2023    POCT Glucose     Row Name 04/01/23 1343             POCT Blood Glucose   Pre-Exercise 175 mg/dL                Pulmonary Assessment Scores:  Pulmonary Assessment Scores     Row Name 04/01/23 1343         ADL UCSD   ADL Phase Entry     SOB Score total 14       CAT Score   CAT Score 10       mMRC Score   mMRC Score 2             UCSD: Self-administered rating of dyspnea associated with activities of daily living (ADLs) 6-point scale (0 = "not at all" to 5 = "maximal or unable to do because of breathlessness")  Scoring Scores range from 0 to  120.  Minimally important difference is 5 units  CAT: CAT can identify the health impairment of COPD patients and is better correlated with disease progression.  CAT has a scoring range of zero to 40. The CAT score is classified into four groups of low (less than 10), medium (10 - 20), high (21-30) and very high (31-40) based on the impact level of disease on health status. A CAT score over 10 suggests significant symptoms.  A worsening CAT score could be explained by an exacerbation, poor medication adherence, poor inhaler technique, or progression of COPD or comorbid conditions.  CAT MCID is 2 points  mMRC: mMRC (Modified Medical Research Council) Dyspnea Scale is used to assess the degree of baseline functional disability in patients of respiratory disease due to dyspnea. No minimal important difference is established. A decrease in score of 1 point or greater is considered a positive change.   Pulmonary Function Assessment:  Pulmonary Function Assessment - 04/01/23 1322       Breath   Bilateral Breath Sounds Decreased    Shortness of Breath Yes;Limiting activity             Exercise Target Goals: Exercise Program Goal: Individual exercise prescription set using results from initial 6 min walk test and THRR while considering  patient's activity barriers and safety.   Exercise Prescription Goal: Initial exercise prescription builds to 30-45 minutes a day of aerobic activity, 2-3 days per week.  Home exercise guidelines will be given to patient during program as part of exercise prescription that the participant will acknowledge.  Activity Barriers & Risk Stratification:  Activity Barriers & Cardiac Risk Stratification - 04/01/23 1321       Activity Barriers & Cardiac Risk Stratification   Activity Barriers Deconditioning;Muscular Weakness;Shortness of Breath;Balance Concerns;History of Falls    Cardiac Risk Stratification Moderate             6 Minute Walk:  6 Minute  Walk     Row Name 04/01/23 1408         6 Minute Walk   Phase Initial     Distance 1254 feet     Walk Time 6 minutes     # of Rest Breaks 0     MPH 2.38     METS 2.46     RPE 11     Perceived Dyspnea  1     VO2 Peak 8.6     Symptoms No     Resting HR 109 bpm     Resting BP 120/64     Resting Oxygen Saturation  96 %     Exercise Oxygen Saturation  during 6 min walk 92 %     Max Ex. HR 2.4 bpm     Max Ex. BP 120/50     2 Minute Post BP 110/50       Interval HR   1 Minute HR 123     2 Minute HR 123     3 Minute HR 111     4 Minute HR 114     5 Minute HR 122     6 Minute HR 126     2 Minute Post HR 93     Interval Heart Rate? Yes       Interval Oxygen   Interval Oxygen? Yes     Baseline Oxygen Saturation % 96 %     1 Minute Oxygen Saturation % 92 %     1 Minute Liters of Oxygen 0 L     2 Minute Oxygen Saturation % 95 %     2 Minute Liters of Oxygen 0 L     3 Minute Oxygen Saturation % 95 %     3 Minute Liters of Oxygen 0 L     4 Minute Oxygen Saturation % 94 %     4 Minute Liters of Oxygen 0 L     5 Minute Oxygen Saturation % 94 %     5 Minute Liters of Oxygen 0 L     6 Minute Oxygen Saturation % 94 %     6 Minute Liters  of Oxygen 0 L     2 Minute Post Oxygen Saturation % 95 %     2 Minute Post Liters of Oxygen 0 L              Oxygen Initial Assessment:  Oxygen Initial Assessment - 04/01/23 1322       Home Oxygen   Home Oxygen Device None    Home Exercise Oxygen Prescription None    Home Resting Oxygen Prescription None      Initial 6 min Walk   Oxygen Used None      Program Oxygen Prescription   Program Oxygen Prescription None      Intervention   Short Term Goals To learn and understand importance of monitoring SPO2 with pulse oximeter and demonstrate accurate use of the pulse oximeter.;To learn and understand importance of maintaining oxygen saturations>88%;To learn and demonstrate proper pursed lip breathing techniques or other breathing  techniques. ;To learn and demonstrate proper use of respiratory medications    Long  Term Goals Maintenance of O2 saturations>88%;Compliance with respiratory medication;Verbalizes importance of monitoring SPO2 with pulse oximeter and return demonstration;Exhibits proper breathing techniques, such as pursed lip breathing or other method taught during program session;Demonstrates proper use of MDI's             Oxygen Re-Evaluation:  Oxygen Re-Evaluation     Row Name 04/05/23 1459             Program Oxygen Prescription   Program Oxygen Prescription None         Home Oxygen   Home Oxygen Device None       Home Exercise Oxygen Prescription None       Home Resting Oxygen Prescription None         Goals/Expected Outcomes   Short Term Goals To learn and understand importance of monitoring SPO2 with pulse oximeter and demonstrate accurate use of the pulse oximeter.;To learn and understand importance of maintaining oxygen saturations>88%;To learn and demonstrate proper pursed lip breathing techniques or other breathing techniques. ;To learn and demonstrate proper use of respiratory medications       Long  Term Goals Maintenance of O2 saturations>88%;Compliance with respiratory medication;Verbalizes importance of monitoring SPO2 with pulse oximeter and return demonstration;Exhibits proper breathing techniques, such as pursed lip breathing or other method taught during program session;Demonstrates proper use of MDI's       Goals/Expected Outcomes Compliance and understanding of oxygen saturation monitoring and breathing techniques to decrease shortness of breath.                Oxygen Discharge (Final Oxygen Re-Evaluation):  Oxygen Re-Evaluation - 04/05/23 1459       Program Oxygen Prescription   Program Oxygen Prescription None      Home Oxygen   Home Oxygen Device None    Home Exercise Oxygen Prescription None    Home Resting Oxygen Prescription None      Goals/Expected  Outcomes   Short Term Goals To learn and understand importance of monitoring SPO2 with pulse oximeter and demonstrate accurate use of the pulse oximeter.;To learn and understand importance of maintaining oxygen saturations>88%;To learn and demonstrate proper pursed lip breathing techniques or other breathing techniques. ;To learn and demonstrate proper use of respiratory medications    Long  Term Goals Maintenance of O2 saturations>88%;Compliance with respiratory medication;Verbalizes importance of monitoring SPO2 with pulse oximeter and return demonstration;Exhibits proper breathing techniques, such as pursed lip breathing or other method taught during program session;Demonstrates proper use  of MDI's    Goals/Expected Outcomes Compliance and understanding of oxygen saturation monitoring and breathing techniques to decrease shortness of breath.             Initial Exercise Prescription:  Initial Exercise Prescription - 04/01/23 1400       Date of Initial Exercise RX and Referring Provider   Date 04/01/23    Referring Provider Vassie Loll    Expected Discharge Date 06/27/23      Recumbant Bike   Level 2    RPM 60    Minutes 15    METs 2.2      Recumbant Elliptical   Level 2    RPM 60    Minutes 15    METs 2.2      Prescription Details   Frequency (times per week) 2    Duration Progress to 30 minutes of continuous aerobic without signs/symptoms of physical distress      Intensity   THRR 40-80% of Max Heartrate 54-109    Ratings of Perceived Exertion 11-13    Perceived Dyspnea 0-4      Progression   Progression Continue progressive overload as per policy without signs/symptoms or physical distress.      Resistance Training   Training Prescription Yes    Weight red bands    Reps 10-15             Perform Capillary Blood Glucose checks as needed.  Exercise Prescription Changes:   Exercise Prescription Changes     Row Name 04/09/23 1200             Response to  Exercise   Blood Pressure (Admit) 110/70       Blood Pressure (Exercise) 116/66       Blood Pressure (Exit) 112/70       Heart Rate (Admit) 95 bpm       Heart Rate (Exercise) 117 bpm       Heart Rate (Exit) 85 bpm       Oxygen Saturation (Admit) 98 %       Oxygen Saturation (Exercise) 97 %       Oxygen Saturation (Exit) 98 %       Rating of Perceived Exertion (Exercise) 11       Perceived Dyspnea (Exercise) 0       Duration Continue with 30 min of aerobic exercise without signs/symptoms of physical distress.       Intensity THRR unchanged         Progression   Progression Continue to progress workloads to maintain intensity without signs/symptoms of physical distress.         Resistance Training   Training Prescription Yes       Weight red bands       Reps 10-15       Time 10 Minutes         Recumbant Bike   Level 2       RPM 42       Watts 13       Minutes 15       METs 2         Recumbant Elliptical   Level 2       Minutes 15       METs 3.1                Exercise Comments:   Exercise Comments     Row Name 04/09/23 1202  Exercise Comments [name] completed his first day of exercise. He exercised for 15 min on the recumbent elliptical and recumbent bike. He averaged 3.1 METs at level 2 on the recumbent elliptical and 2.0 METs at level 2 on the recument bike. He performed the warmup and cooldown standing with some verbal cues. Discussed METs.                Exercise Goals and Review:   Exercise Goals     Row Name 04/01/23 1321             Exercise Goals   Increase Physical Activity Yes       Intervention Provide advice, education, support and counseling about physical activity/exercise needs.;Develop an individualized exercise prescription for aerobic and resistive training based on initial evaluation findings, risk stratification, comorbidities and participant's personal goals.       Expected Outcomes Short Term: Attend rehab on a  regular basis to increase amount of physical activity.;Long Term: Add in home exercise to make exercise part of routine and to increase amount of physical activity.;Long Term: Exercising regularly at least 3-5 days a week.       Increase Strength and Stamina Yes       Intervention Provide advice, education, support and counseling about physical activity/exercise needs.;Develop an individualized exercise prescription for aerobic and resistive training based on initial evaluation findings, risk stratification, comorbidities and participant's personal goals.       Expected Outcomes Short Term: Increase workloads from initial exercise prescription for resistance, speed, and METs.;Short Term: Perform resistance training exercises routinely during rehab and add in resistance training at home;Long Term: Improve cardiorespiratory fitness, muscular endurance and strength as measured by increased METs and functional capacity ( )       Able to understand and use rate of perceived exertion (RPE) scale Yes       Intervention Provide education and explanation on how to use RPE scale       Expected Outcomes Short Term: Able to use RPE daily in rehab to express subjective intensity level;Long Term:  Able to use RPE to guide intensity level when exercising independently       Able to understand and use Dyspnea scale Yes       Intervention Provide education and explanation on how to use Dyspnea scale       Expected Outcomes Short Term: Able to use Dyspnea scale daily in rehab to express subjective sense of shortness of breath during exertion;Long Term: Able to use Dyspnea scale to guide intensity level when exercising independently       Knowledge and understanding of Target Heart Rate Range (THRR) Yes       Intervention Provide education and explanation of THRR including how the numbers were predicted and where they are located for reference       Expected Outcomes Short Term: Able to state/look up THRR;Long Term:  Able to use THRR to govern intensity when exercising independently;Short Term: Able to use daily as guideline for intensity in rehab       Understanding of Exercise Prescription Yes       Intervention Provide education, explanation, and written materials on patient's individual exercise prescription       Expected Outcomes Short Term: Able to explain program exercise prescription;Long Term: Able to explain home exercise prescription to exercise independently                Exercise Goals Re-Evaluation :  Exercise Goals Re-Evaluation  Row Name 04/05/23 1456             Exercise Goal Re-Evaluation   Exercise Goals Review Increase Physical Activity;Able to understand and use Dyspnea scale;Understanding of Exercise Prescription;Increase Strength and Stamina;Knowledge and understanding of Target Heart Rate Range (THRR);Able to understand and use rate of perceived exertion (RPE) scale       Comments [name] is scheduled to begin exercise on 1/21. Will monitor and progress as able.       Expected Outcomes Through exercise at rehab and home, the patient will decrease shortness of breath with daily activities and feel confident in carrying out an exercise regimen at home.                Discharge Exercise Prescription (Final Exercise Prescription Changes):  Exercise Prescription Changes - 04/09/23 1200       Response to Exercise   Blood Pressure (Admit) 110/70    Blood Pressure (Exercise) 116/66    Blood Pressure (Exit) 112/70    Heart Rate (Admit) 95 bpm    Heart Rate (Exercise) 117 bpm    Heart Rate (Exit) 85 bpm    Oxygen Saturation (Admit) 98 %    Oxygen Saturation (Exercise) 97 %    Oxygen Saturation (Exit) 98 %    Rating of Perceived Exertion (Exercise) 11    Perceived Dyspnea (Exercise) 0    Duration Continue with 30 min of aerobic exercise without signs/symptoms of physical distress.    Intensity THRR unchanged      Progression   Progression Continue to progress  workloads to maintain intensity without signs/symptoms of physical distress.      Resistance Training   Training Prescription Yes    Weight red bands    Reps 10-15    Time 10 Minutes      Recumbant Bike   Level 2    RPM 42    Watts 13    Minutes 15    METs 2      Recumbant Elliptical   Level 2    Minutes 15    METs 3.1             Nutrition:  Target Goals: Understanding of nutrition guidelines, daily intake of sodium 1500mg , cholesterol 200mg , calories 30% from fat and 7% or less from saturated fats, daily to have 5 or more servings of fruits and vegetables.  Biometrics:  Pre Biometrics - 04/01/23 1436       Pre Biometrics   Grip Strength 22 kg              Nutrition Therapy Plan and Nutrition Goals:   Nutrition Assessments:  MEDIFICTS Score Key: >=70 Need to make dietary changes  40-70 Heart Healthy Diet <= 40 Therapeutic Level Cholesterol Diet   Picture Your Plate Scores: <16 Unhealthy dietary pattern with much room for improvement. 41-50 Dietary pattern unlikely to meet recommendations for good health and room for improvement. 51-60 More healthful dietary pattern, with some room for improvement.  >60 Healthy dietary pattern, although there may be some specific behaviors that could be improved.    Nutrition Goals Re-Evaluation:   Nutrition Goals Discharge (Final Nutrition Goals Re-Evaluation):   Psychosocial: Target Goals: Acknowledge presence or absence of significant depression and/or stress, maximize coping skills, provide positive support system. Participant is able to verbalize types and ability to use techniques and skills needed for reducing stress and depression.  Initial Review & Psychosocial Screening:  Initial Psych Review & Screening - 04/01/23 1318  Initial Review   Current issues with None Identified      Family Dynamics   Good Support System? Yes    Comments Pts wife is a former [employer] fundraiser. She is good support for pt.       Barriers   Psychosocial barriers to participate in program There are no identifiable barriers or psychosocial needs.      Screening Interventions   Interventions Encouraged to exercise    Expected Outcomes Short Term goal: Utilizing psychosocial counselor, staff and physician to assist with identification of specific Stressors or current issues interfering with healing process. Setting desired goal for each stressor or current issue identified.;Long Term Goal: Stressors or current issues are controlled or eliminated.;Short Term goal: Identification and review with participant of any Quality of Life or Depression concerns found by scoring the questionnaire.;Long Term goal: The participant improves quality of Life and PHQ9 Scores as seen by post scores and/or verbalization of changes             Quality of Life Scores:  Scores of 19 and below usually indicate a poorer quality of life in these areas.  A difference of  2-3 points is a clinically meaningful difference.  A difference of 2-3 points in the total score of the Quality of Life Index has been associated with significant improvement in overall quality of life, self-image, physical symptoms, and general health in studies assessing change in quality of life.  PHQ-9: Review Flowsheet       04/01/2023  Depression screen PHQ 2/9  Decreased Interest 0  Down, Depressed, Hopeless 0  PHQ - 2 Score 0  Altered sleeping 1  Tired, decreased energy 1  Change in appetite 0  Feeling bad or failure about yourself  0  Trouble concentrating 0  Moving slowly or fidgety/restless 0  Suicidal thoughts 0  PHQ-9 Score 2  Difficult doing work/chores Somewhat difficult   Interpretation of Total Score  Total Score Depression Severity:  1-4 = Minimal depression, 5-9 = Mild depression, 10-14 = Moderate depression, 15-19 = Moderately severe depression, 20-27 = Severe depression   Psychosocial Evaluation and Intervention:  Psychosocial Evaluation -  04/01/23 1319       Psychosocial Evaluation & Interventions   Interventions Encouraged to exercise with the program and follow exercise prescription    Comments [name] denies any psychosocial barriers or concerns at this time.    Expected Outcomes For [name] to participate in PR free of any psychosocial barriers or conerns    Continue Psychosocial Services  No Follow up required             Psychosocial Re-Evaluation:  Psychosocial Re-Evaluation     Row Name 04/05/23 1105             Psychosocial Re-Evaluation   Current issues with None Identified       Comments [name] is scheduled to start the program on 04/09/23. No new psychosocial barriers or concerns since orientation.       Expected Outcomes For [name] to participate in PR free of any psychosocial barriers or concerns       Interventions Encouraged to attend Pulmonary Rehabilitation for the exercise       Continue Psychosocial Services  No Follow up required                Psychosocial Discharge (Final Psychosocial Re-Evaluation):  Psychosocial Re-Evaluation - 04/05/23 1105       Psychosocial Re-Evaluation   Current issues with None Identified  Comments [name] is scheduled to start the program on 04/09/23. No new psychosocial barriers or concerns since orientation.    Expected Outcomes For [name] to participate in PR free of any psychosocial barriers or concerns    Interventions Encouraged to attend Pulmonary Rehabilitation for the exercise    Continue Psychosocial Services  No Follow up required             Education: Education Goals: Education classes will be provided on a weekly basis, covering required topics. Participant will state understanding/return demonstration of topics presented.  Learning Barriers/Preferences:  Learning Barriers/Preferences - 04/01/23 1319       Learning Barriers/Preferences   Learning Barriers Hearing    Learning Preferences Group Instruction;Individual  Instruction;Written Material             Education Topics: Know Your Numbers Group instruction that is supported by a PowerPoint presentation. Instructor discusses importance of knowing and understanding resting, exercise, and post-exercise oxygen saturation, heart rate, and blood pressure. Oxygen saturation, heart rate, blood pressure, rating of perceived exertion, and dyspnea are reviewed along with a normal range for these values.    Exercise for the Pulmonary Patient Group instruction that is supported by a PowerPoint presentation. Instructor discusses benefits of exercise, core components of exercise, frequency, duration, and intensity of an exercise routine, importance of utilizing pulse oximetry during exercise, safety while exercising, and options of places to exercise outside of rehab.    MET Level  Group instruction provided by PowerPoint, verbal discussion, and written material to support subject matter. Instructor reviews what METs are and how to increase METs.    Pulmonary Medications Verbally interactive group education provided by instructor with focus on inhaled medications and proper administration.   Anatomy and Physiology of the Respiratory System Group instruction provided by PowerPoint, verbal discussion, and written material to support subject matter. Instructor reviews respiratory cycle and anatomical components of the respiratory system and their functions. Instructor also reviews differences in obstructive and restrictive respiratory diseases with examples of each.    Oxygen Safety Group instruction provided by PowerPoint, verbal discussion, and written material to support subject matter. There is an overview of "What is Oxygen" and "Why do we need it".  Instructor also reviews how to create a safe environment for oxygen use, the importance of using oxygen as prescribed, and the risks of noncompliance. There is a brief discussion on traveling with oxygen and  resources the patient may utilize.   Oxygen Use Group instruction provided by PowerPoint, verbal discussion, and written material to discuss how supplemental oxygen is prescribed and different types of oxygen supply systems. Resources for more information are provided.    Breathing Techniques Group instruction that is supported by demonstration and informational handouts. Instructor discusses the benefits of pursed lip and diaphragmatic breathing and detailed demonstration on how to perform both.     Risk Factor Reduction Group instruction that is supported by a PowerPoint presentation. Instructor discusses the definition of a risk factor, different risk factors for pulmonary disease, and how the heart and lungs work together.   Pulmonary Diseases Group instruction provided by PowerPoint, verbal discussion, and written material to support subject matter. Instructor gives an overview of the different type of pulmonary diseases. There is also a discussion on risk factors and symptoms as well as ways to manage the diseases.   Stress and Energy Conservation Group instruction provided by PowerPoint, verbal discussion, and written material to support subject matter. Instructor gives an overview of stress and the  impact it can have on the body. Instructor also reviews ways to reduce stress. There is also a discussion on energy conservation and ways to conserve energy throughout the day.   Warning Signs and Symptoms Group instruction provided by PowerPoint, verbal discussion, and written material to support subject matter. Instructor reviews warning signs and symptoms of stroke, heart attack, cold and flu. Instructor also reviews ways to prevent the spread of infection.   Other Education Group or individual verbal, written, or video instructions that support the educational goals of the pulmonary rehab program.    Knowledge Questionnaire Score:  Knowledge Questionnaire Score - 04/01/23 1344        Knowledge Questionnaire Score   Pre Score 16/18             Core Components/Risk Factors/Patient Goals at Admission:  Personal Goals and Risk Factors at Admission - 04/01/23 1320       Core Components/Risk Factors/Patient Goals on Admission   Improve shortness of breath with ADL's Yes    Intervention Provide education, individualized exercise plan and daily activity instruction to help decrease symptoms of SOB with activities of daily living.    Expected Outcomes Short Term: Improve cardiorespiratory fitness to achieve a reduction of symptoms when performing ADLs;Long Term: Be able to perform more ADLs without symptoms or delay the onset of symptoms    Increase knowledge of respiratory medications and ability to use respiratory devices properly  Yes    Expected Outcomes Short Term: Achieves understanding of medications use. Understands that oxygen is a medication prescribed by physician. Demonstrates appropriate use of inhaler and oxygen therapy.;Long Term: Maintain appropriate use of medications, inhalers, and oxygen therapy.             Core Components/Risk Factors/Patient Goals Review:   Goals and Risk Factor Review     Row Name 04/05/23 1107             Core Components/Risk Factors/Patient Goals Review   Personal Goals Review Improve shortness of breath with ADL's;Develop more efficient breathing techniques such as purse lipped breathing and diaphragmatic breathing and practicing self-pacing with activity.;Increase knowledge of respiratory medications and ability to use respiratory devices properly.       Review We are unable to assess [name]'s goals at this time. [name] is scheduled to start the program on 04/09/23.       Expected Outcomes See admission goals                Core Components/Risk Factors/Patient Goals at Discharge (Final Review):   Goals and Risk Factor Review - 04/05/23 1107       Core Components/Risk Factors/Patient Goals Review    Personal Goals Review Improve shortness of breath with ADL's;Develop more efficient breathing techniques such as purse lipped breathing and diaphragmatic breathing and practicing self-pacing with activity.;Increase knowledge of respiratory medications and ability to use respiratory devices properly.    Review We are unable to assess [name]'s goals at this time. [name] is scheduled to start the program on 04/09/23.    Expected Outcomes See admission goals             ITP Comments: Pt is making expected progress toward Pulmonary Rehab goals after completing 1 session(s). Recommend continued exercise, life style modification, education, and utilization of breathing techniques to increase stamina and strength, while also decreasing shortness of breath with exertion.  Dr. Mechele Collin is Medical Director for Pulmonary Rehab at [facility].

## 2023-04-10 NOTE — Progress Notes (Signed)
Pulmonary Individual Treatment Plan  Patient Details  Name: [name] MRN: [id number] Date of Birth: 08-23-1940 Referring Provider:   Doristine Devoid Pulmonary Rehab Walk Test from 04/01/2023 in [facility]  Referring Provider Vassie Loll       Initial Encounter Date:  Flowsheet Row Pulmonary Rehab Walk Test from 04/01/2023 in [facility] for [facility], Vascular, [facility] Lung [facility]  Date 04/01/23       Visit Diagnosis: Stage 2 moderate COPD by GOLD classification (HCC)  Patient's Home Medications on Admission:   Current Outpatient Medications:    albuterol (VENTOLIN HFA) 108 (90 Base) MCG/ACT inhaler, Inhale 2 puffs into the lungs every 6 (six) hours as needed for wheezing or shortness of breath., Disp: 8 g, Rfl: 2   aspirin 81 MG chewable tablet, Chew 81 mg by mouth daily., Disp: , Rfl:    Biotin 59563 MCG TABS, Take 1 tablet by mouth See admin instructions. Take 10,000 mcg by mouth once a day, Disp: , Rfl:    Cholecalciferol (VITAMIN D3) 25 MCG (1000 UT) CAPS, Take 1,000 Units by mouth daily., Disp: , Rfl:    Cyanocobalamin (VITAMIN B-12 SL), Place 1 tablet under the tongue daily., Disp: , Rfl:    diltiazem (CARDIZEM CD) 180 MG 24 hr capsule, TAKE 1 CAPSULE(180 MG) BY MOUTH DAILY (Patient taking differently: Take 180 mg by mouth in the morning.), Disp: 90 capsule, Rfl: 0   folic acid (FOLVITE) 1 MG tablet, Take 1 mg by mouth daily., Disp: , Rfl:    HYDROcodone bit-homatropine (HYCODAN) 5-1.5 MG/5ML syrup, Take 5 mLs by mouth every 4 (four) hours as needed for cough., Disp: 120 mL, Rfl: 0   metFORMIN (GLUCOPHAGE) 500 MG tablet, Take 500 mg by mouth daily with breakfast., Disp: , Rfl:    pimecrolimus (ELIDEL) 1 % cream, Apply topically 2 (two) times daily., Disp: 100 g, Rfl: 2   pravastatin (PRAVACHOL) 40 MG tablet, Take 1 tablet (40 mg total) by mouth daily., Disp: 90 tablet, Rfl: 3   SYMBICORT 80-4.5 MCG/ACT  inhaler, INHALE 2 PUFFS BY MOUTH IN THE MORNING AND 2 PUFFS AT BEDTIME (Patient taking differently: Inhale 2 puffs into the lungs in the morning and at bedtime.), Disp: 10.2 g, Rfl: 6   triamcinolone cream (KENALOG) 0.1 %, Apply 1 Application topically 2 (two) times daily as needed (for eczema flares)., Disp: , Rfl:   Past Medical History: 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   02/03/2016-respiratory allergy panel, environmental allergens to dust mites, cat dander, dog dander, Timothy grass, cockroach, fungus, cedar trees, ragweed, rough pigweed, mouse urine, IgE is 689 >>>Largest elevations are to cat dander, dog dander, dust mites  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)  06/27/2016-pulmonary function test-FVC 3.03 (87% predicted), postbronchodilator ratio 54, postbronc   Basal cell carcinoma 08/20/2016   left cheek-cx3 excision   BCC (basal cell carcinoma) 09/20/2016   left cheek-mohs   CAD (coronary artery disease)    cath & CABG in 2008   Cardiomyopathy, 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   COPD (  chronic obstructive pulmonary disease) (HCC)    COPD with acute exacerbation (HCC) 04/02/2018   Diabetes (HCC)    type 2   DM2 (diabetes mellitus, type 2) (HCC) 02/05/2013   Dyslipidemia    Environmental allergies 02/03/2016   02/03/2016-respiratory allergy panel, environmental allergens to dust mites, cat dander, dog dander, Timothy grass, cockroach, fungus, cedar trees, ragweed, rough pigweed, mouse urine, IgE  is 689 >>>Largest elevations are to cat dander, dog dander, dust mites    Essential hypertension 02/03/2016   Former smoker 05/14/2018   Former smoker Quit 2008 43-pack-year smoking history   History of nuclear stress test 10/08/2007   bruce myoview; normal scan with attenuation artifact; no ischemia/infarct; low risk    Hypertriglyceridemia 02/03/2016   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 Maren Beach - [place] to LAD, sequential saphenous vein graft to OM1 and OM 2, SVG to posterior descending (done emergently)     Tobacco Use: Social History   Tobacco Use  Smoking Status Former   Current packs/day: 0.00   Average packs/day: 1 pack/day for 43.1 years (43.1 ttl pk-yrs)   Types: Cigarettes   Start date: 12/25/1963   Quit date: 01/31/2007   Years since quitting: 16.2  Smokeless Tobacco Never    Labs: Review Flowsheet       Latest Ref Rng & Units 09/16/2015 03/09/2023  Labs for ITP Cardiac and Pulmonary Rehab  Hemoglobin A1c 4.8 - 5.6 % - 6.4   TCO2 0 - 100 mmol/L 26  -    Capillary Blood Glucose: Lab Results  Component Value Date   GLUCAP 103 (H) 04/09/2023   GLUCAP 135 (H) 04/09/2023   GLUCAP 175 (H) 04/01/2023   GLUCAP 200 (H) 03/11/2023   GLUCAP 167 (H) 03/11/2023    POCT Glucose     Row Name 04/01/23 1343             POCT Blood Glucose   Pre-Exercise 175 mg/dL                Pulmonary Assessment Scores:  Pulmonary Assessment Scores     Row Name 04/01/23 1343         ADL UCSD   ADL Phase Entry     SOB Score total 14       CAT Score   CAT Score 10       mMRC Score   mMRC Score 2             UCSD: Self-administered rating of dyspnea associated with activities of daily living (ADLs) 6-point scale (0 = "not at all" to 5 = "maximal or unable to do because of breathlessness")  Scoring Scores range from 0 to  120.  Minimally important difference is 5 units  CAT: CAT can identify the health impairment of COPD patients and is better correlated with disease progression.  CAT has a scoring range of zero to 40. The CAT score is classified into four groups of low (less than 10), medium (10 - 20), high (21-30) and very high (31-40) based on the impact level of disease on health status. A CAT score over 10 suggests significant symptoms.  A worsening CAT score could be explained by an exacerbation, poor medication adherence, poor inhaler technique, or progression of COPD or comorbid conditions.  CAT MCID is 2 points  mMRC: mMRC (Modified Medical Research Council) Dyspnea Scale is used to assess the degree of baseline functional disability in patients of respiratory disease due to dyspnea. No minimal important difference is established. A decrease in score of 1 point or greater is considered a positive change.   Pulmonary Function Assessment:  Pulmonary Function Assessment - 04/01/23 1322       Breath   Bilateral Breath Sounds Decreased    Shortness of Breath Yes;Limiting activity             Exercise Target Goals: Exercise Program Goal: Individual exercise prescription set using results from initial 6 min walk test and THRR while considering  patient's activity barriers and safety.   Exercise Prescription Goal: Initial exercise prescription builds to 30-45 minutes a day of aerobic activity, 2-3 days per week.  Home exercise guidelines will be given to patient during program as part of exercise prescription that the participant will acknowledge.  Activity Barriers & Risk Stratification:  Activity Barriers & Cardiac Risk Stratification - 04/01/23 1321       Activity Barriers & Cardiac Risk Stratification   Activity Barriers Deconditioning;Muscular Weakness;Shortness of Breath;Balance Concerns;History of Falls    Cardiac Risk Stratification Moderate             6 Minute Walk:  6 Minute  Walk     Row Name 04/01/23 1408         6 Minute Walk   Phase Initial     Distance 1254 feet     Walk Time 6 minutes     # of Rest Breaks 0     MPH 2.38     METS 2.46     RPE 11     Perceived Dyspnea  1     VO2 Peak 8.6     Symptoms No     Resting HR 109 bpm     Resting BP 120/64     Resting Oxygen Saturation  96 %     Exercise Oxygen Saturation  during 6 min walk 92 %     Max Ex. HR 2.4 bpm     Max Ex. BP 120/50     2 Minute Post BP 110/50       Interval HR   1 Minute HR 123     2 Minute HR 123     3 Minute HR 111     4 Minute HR 114     5 Minute HR 122     6 Minute HR 126     2 Minute Post HR 93     Interval Heart Rate? Yes       Interval Oxygen   Interval Oxygen? Yes     Baseline Oxygen Saturation % 96 %     1 Minute Oxygen Saturation % 92 %     1 Minute Liters of Oxygen 0 L     2 Minute Oxygen Saturation % 95 %     2 Minute Liters of Oxygen 0 L     3 Minute Oxygen Saturation % 95 %     3 Minute Liters of Oxygen 0 L     4 Minute Oxygen Saturation % 94 %     4 Minute Liters of Oxygen 0 L     5 Minute Oxygen Saturation % 94 %     5 Minute Liters of Oxygen 0 L     6 Minute Oxygen Saturation % 94 %     6 Minute Liters  of Oxygen 0 L     2 Minute Post Oxygen Saturation % 95 %     2 Minute Post Liters of Oxygen 0 L              Oxygen Initial Assessment:  Oxygen Initial Assessment - 04/01/23 1322       Home Oxygen   Home Oxygen Device None    Home Exercise Oxygen Prescription None    Home Resting Oxygen Prescription None      Initial 6 min Walk   Oxygen Used None      Program Oxygen Prescription   Program Oxygen Prescription None      Intervention   Short Term Goals To learn and understand importance of monitoring SPO2 with pulse oximeter and demonstrate accurate use of the pulse oximeter.;To learn and understand importance of maintaining oxygen saturations>88%;To learn and demonstrate proper pursed lip breathing techniques or other breathing  techniques. ;To learn and demonstrate proper use of respiratory medications    Long  Term Goals Maintenance of O2 saturations>88%;Compliance with respiratory medication;Verbalizes importance of monitoring SPO2 with pulse oximeter and return demonstration;Exhibits proper breathing techniques, such as pursed lip breathing or other method taught during program session;Demonstrates proper use of MDI's             Oxygen Re-Evaluation:  Oxygen Re-Evaluation     Row Name 04/05/23 1459             Program Oxygen Prescription   Program Oxygen Prescription None         Home Oxygen   Home Oxygen Device None       Home Exercise Oxygen Prescription None       Home Resting Oxygen Prescription None         Goals/Expected Outcomes   Short Term Goals To learn and understand importance of monitoring SPO2 with pulse oximeter and demonstrate accurate use of the pulse oximeter.;To learn and understand importance of maintaining oxygen saturations>88%;To learn and demonstrate proper pursed lip breathing techniques or other breathing techniques. ;To learn and demonstrate proper use of respiratory medications       Long  Term Goals Maintenance of O2 saturations>88%;Compliance with respiratory medication;Verbalizes importance of monitoring SPO2 with pulse oximeter and return demonstration;Exhibits proper breathing techniques, such as pursed lip breathing or other method taught during program session;Demonstrates proper use of MDI's       Goals/Expected Outcomes Compliance and understanding of oxygen saturation monitoring and breathing techniques to decrease shortness of breath.                Oxygen Discharge (Final Oxygen Re-Evaluation):  Oxygen Re-Evaluation - 04/05/23 1459       Program Oxygen Prescription   Program Oxygen Prescription None      Home Oxygen   Home Oxygen Device None    Home Exercise Oxygen Prescription None    Home Resting Oxygen Prescription None      Goals/Expected  Outcomes   Short Term Goals To learn and understand importance of monitoring SPO2 with pulse oximeter and demonstrate accurate use of the pulse oximeter.;To learn and understand importance of maintaining oxygen saturations>88%;To learn and demonstrate proper pursed lip breathing techniques or other breathing techniques. ;To learn and demonstrate proper use of respiratory medications    Long  Term Goals Maintenance of O2 saturations>88%;Compliance with respiratory medication;Verbalizes importance of monitoring SPO2 with pulse oximeter and return demonstration;Exhibits proper breathing techniques, such as pursed lip breathing or other method taught during program session;Demonstrates proper use  of MDI's    Goals/Expected Outcomes Compliance and understanding of oxygen saturation monitoring and breathing techniques to decrease shortness of breath.             Initial Exercise Prescription:  Initial Exercise Prescription - 04/01/23 1400       Date of Initial Exercise RX and Referring Provider   Date 04/01/23    Referring Provider Vassie Loll    Expected Discharge Date 06/27/23      Recumbant Bike   Level 2    RPM 60    Minutes 15    METs 2.2      Recumbant Elliptical   Level 2    RPM 60    Minutes 15    METs 2.2      Prescription Details   Frequency (times per week) 2    Duration Progress to 30 minutes of continuous aerobic without signs/symptoms of physical distress      Intensity   THRR 40-80% of Max Heartrate 54-109    Ratings of Perceived Exertion 11-13    Perceived Dyspnea 0-4      Progression   Progression Continue progressive overload as per policy without signs/symptoms or physical distress.      Resistance Training   Training Prescription Yes    Weight red bands    Reps 10-15             Perform Capillary Blood Glucose checks as needed.  Exercise Prescription Changes:   Exercise Prescription Changes     Row Name 04/09/23 1200             Response to  Exercise   Blood Pressure (Admit) 110/70       Blood Pressure (Exercise) 116/66       Blood Pressure (Exit) 112/70       Heart Rate (Admit) 95 bpm       Heart Rate (Exercise) 117 bpm       Heart Rate (Exit) 85 bpm       Oxygen Saturation (Admit) 98 %       Oxygen Saturation (Exercise) 97 %       Oxygen Saturation (Exit) 98 %       Rating of Perceived Exertion (Exercise) 11       Perceived Dyspnea (Exercise) 0       Duration Continue with 30 min of aerobic exercise without signs/symptoms of physical distress.       Intensity THRR unchanged         Progression   Progression Continue to progress workloads to maintain intensity without signs/symptoms of physical distress.         Resistance Training   Training Prescription Yes       Weight red bands       Reps 10-15       Time 10 Minutes         Recumbant Bike   Level 2       RPM 42       Watts 13       Minutes 15       METs 2         Recumbant Elliptical   Level 2       Minutes 15       METs 3.1                Exercise Comments:   Exercise Comments     Row Name 04/09/23 1202  Exercise Comments Peyton Najjar completed his first day of exercise. He exercised for 15 min on the recumbent elliptical and recumbent bike. He averaged 3.1 METs at level 2 on the recumbent elliptical and 2.0 METs at level 2 on the recument bike. He performed the warmup and cooldown standing with some verbal cues. Discussed METs.                Exercise Goals and Review:   Exercise Goals     Row Name 04/01/23 1321             Exercise Goals   Increase Physical Activity Yes       Intervention Provide advice, education, support and counseling about physical activity/exercise needs.;Develop an individualized exercise prescription for aerobic and resistive training based on initial evaluation findings, risk stratification, comorbidities and participant's personal goals.       Expected Outcomes Short Term: Attend rehab on a  regular basis to increase amount of physical activity.;Long Term: Add in home exercise to make exercise part of routine and to increase amount of physical activity.;Long Term: Exercising regularly at least 3-5 days a week.       Increase Strength and Stamina Yes       Intervention Provide advice, education, support and counseling about physical activity/exercise needs.;Develop an individualized exercise prescription for aerobic and resistive training based on initial evaluation findings, risk stratification, comorbidities and participant's personal goals.       Expected Outcomes Short Term: Increase workloads from initial exercise prescription for resistance, speed, and METs.;Short Term: Perform resistance training exercises routinely during rehab and add in resistance training at home;Long Term: Improve cardiorespiratory fitness, muscular endurance and strength as measured by increased METs and functional capacity ( )       Able to understand and use rate of perceived exertion (RPE) scale Yes       Intervention Provide education and explanation on how to use RPE scale       Expected Outcomes Short Term: Able to use RPE daily in rehab to express subjective intensity level;Long Term:  Able to use RPE to guide intensity level when exercising independently       Able to understand and use Dyspnea scale Yes       Intervention Provide education and explanation on how to use Dyspnea scale       Expected Outcomes Short Term: Able to use Dyspnea scale daily in rehab to express subjective sense of shortness of breath during exertion;Long Term: Able to use Dyspnea scale to guide intensity level when exercising independently       Knowledge and understanding of Target Heart Rate Range (THRR) Yes       Intervention Provide education and explanation of THRR including how the numbers were predicted and where they are located for reference       Expected Outcomes Short Term: Able to state/look up THRR;Long Term:  Able to use THRR to govern intensity when exercising independently;Short Term: Able to use daily as guideline for intensity in rehab       Understanding of Exercise Prescription Yes       Intervention Provide education, explanation, and written materials on patient's individual exercise prescription       Expected Outcomes Short Term: Able to explain program exercise prescription;Long Term: Able to explain home exercise prescription to exercise independently                Exercise Goals Re-Evaluation :  Exercise Goals Re-Evaluation  Row Name 04/05/23 1456             Exercise Goal Re-Evaluation   Exercise Goals Review Increase Physical Activity;Able to understand and use Dyspnea scale;Understanding of Exercise Prescription;Increase Strength and Stamina;Knowledge and understanding of Target Heart Rate Range (THRR);Able to understand and use rate of perceived exertion (RPE) scale       Comments Zaylin is scheduled to begin exercise on 1/21. Will monitor and progress as able.       Expected Outcomes Through exercise at rehab and home, the patient will decrease shortness of breath with daily activities and feel confident in carrying out an exercise regimen at home.                Discharge Exercise Prescription (Final Exercise Prescription Changes):  Exercise Prescription Changes - 04/09/23 1200       Response to Exercise   Blood Pressure (Admit) 110/70    Blood Pressure (Exercise) 116/66    Blood Pressure (Exit) 112/70    Heart Rate (Admit) 95 bpm    Heart Rate (Exercise) 117 bpm    Heart Rate (Exit) 85 bpm    Oxygen Saturation (Admit) 98 %    Oxygen Saturation (Exercise) 97 %    Oxygen Saturation (Exit) 98 %    Rating of Perceived Exertion (Exercise) 11    Perceived Dyspnea (Exercise) 0    Duration Continue with 30 min of aerobic exercise without signs/symptoms of physical distress.    Intensity THRR unchanged      Progression   Progression Continue to progress  workloads to maintain intensity without signs/symptoms of physical distress.      Resistance Training   Training Prescription Yes    Weight red bands    Reps 10-15    Time 10 Minutes      Recumbant Bike   Level 2    RPM 42    Watts 13    Minutes 15    METs 2      Recumbant Elliptical   Level 2    Minutes 15    METs 3.1             Nutrition:  Target Goals: Understanding of nutrition guidelines, daily intake of sodium 1500mg , cholesterol 200mg , calories 30% from fat and 7% or less from saturated fats, daily to have 5 or more servings of fruits and vegetables.  Biometrics:  Pre Biometrics - 04/01/23 1436       Pre Biometrics   Grip Strength 22 kg              Nutrition Therapy Plan and Nutrition Goals:   Nutrition Assessments:  MEDIFICTS Score Key: >=70 Need to make dietary changes  40-70 Heart Healthy Diet <= 40 Therapeutic Level Cholesterol Diet   Picture Your Plate Scores: <16 Unhealthy dietary pattern with much room for improvement. 41-50 Dietary pattern unlikely to meet recommendations for good health and room for improvement. 51-60 More healthful dietary pattern, with some room for improvement.  >60 Healthy dietary pattern, although there may be some specific behaviors that could be improved.    Nutrition Goals Re-Evaluation:   Nutrition Goals Discharge (Final Nutrition Goals Re-Evaluation):   Psychosocial: Target Goals: Acknowledge presence or absence of significant depression and/or stress, maximize coping skills, provide positive support system. Participant is able to verbalize types and ability to use techniques and skills needed for reducing stress and depression.  Initial Review & Psychosocial Screening:  Initial Psych Review & Screening - 04/01/23 1318  Initial Review   Current issues with None Identified      Family Dynamics   Good Support System? Yes    Comments Pts wife is a former [employer] fundraiser. She is good support for pt.       Barriers   Psychosocial barriers to participate in program There are no identifiable barriers or psychosocial needs.      Screening Interventions   Interventions Encouraged to exercise    Expected Outcomes Short Term goal: Utilizing psychosocial counselor, staff and physician to assist with identification of specific Stressors or current issues interfering with healing process. Setting desired goal for each stressor or current issue identified.;Long Term Goal: Stressors or current issues are controlled or eliminated.;Short Term goal: Identification and review with participant of any Quality of Life or Depression concerns found by scoring the questionnaire.;Long Term goal: The participant improves quality of Life and PHQ9 Scores as seen by post scores and/or verbalization of changes             Quality of Life Scores:  Scores of 19 and below usually indicate a poorer quality of life in these areas.  A difference of  2-3 points is a clinically meaningful difference.  A difference of 2-3 points in the total score of the Quality of Life Index has been associated with significant improvement in overall quality of life, self-image, physical symptoms, and general health in studies assessing change in quality of life.  PHQ-9: Review Flowsheet       04/01/2023  Depression screen PHQ 2/9  Decreased Interest 0  Down, Depressed, Hopeless 0  PHQ - 2 Score 0  Altered sleeping 1  Tired, decreased energy 1  Change in appetite 0  Feeling bad or failure about yourself  0  Trouble concentrating 0  Moving slowly or fidgety/restless 0  Suicidal thoughts 0  PHQ-9 Score 2  Difficult doing work/chores Somewhat difficult   Interpretation of Total Score  Total Score Depression Severity:  1-4 = Minimal depression, 5-9 = Mild depression, 10-14 = Moderate depression, 15-19 = Moderately severe depression, 20-27 = Severe depression   Psychosocial Evaluation and Intervention:  Psychosocial Evaluation -  04/01/23 1319       Psychosocial Evaluation & Interventions   Interventions Encouraged to exercise with the program and follow exercise prescription    Comments Shawnte denies any psychosocial barriers or concerns at this time.    Expected Outcomes For [place] to participate in PR free of any psychosocial barriers or conerns    Continue Psychosocial Services  No Follow up required             Psychosocial Re-Evaluation:  Psychosocial Re-Evaluation     Row Name 04/05/23 1105             Psychosocial Re-Evaluation   Current issues with None Identified       Comments Elisio is scheduled to start the program on 04/09/23. No new psychosocial barriers or concerns since orientation.       Expected Outcomes For Schneider to participate in PR free of any psychosocial barriers or concerns       Interventions Encouraged to attend Pulmonary Rehabilitation for the exercise       Continue Psychosocial Services  No Follow up required                Psychosocial Discharge (Final Psychosocial Re-Evaluation):  Psychosocial Re-Evaluation - 04/05/23 1105       Psychosocial Re-Evaluation   Current issues with None Identified  Comments Coren is scheduled to start the program on 04/09/23. No new psychosocial barriers or concerns since orientation.    Expected Outcomes For Isaah to participate in PR free of any psychosocial barriers or concerns    Interventions Encouraged to attend Pulmonary Rehabilitation for the exercise    Continue Psychosocial Services  No Follow up required             Education: Education Goals: Education classes will be provided on a weekly basis, covering required topics. Participant will state understanding/return demonstration of topics presented.  Learning Barriers/Preferences:  Learning Barriers/Preferences - 04/01/23 1319       Learning Barriers/Preferences   Learning Barriers Hearing    Learning Preferences Group Instruction;Individual  Instruction;Written Material             Education Topics: Know Your Numbers Group instruction that is supported by a PowerPoint presentation. Instructor discusses importance of knowing and understanding resting, exercise, and post-exercise oxygen saturation, heart rate, and blood pressure. Oxygen saturation, heart rate, blood pressure, rating of perceived exertion, and dyspnea are reviewed along with a normal range for these values.    Exercise for the Pulmonary Patient Group instruction that is supported by a PowerPoint presentation. Instructor discusses benefits of exercise, core components of exercise, frequency, duration, and intensity of an exercise routine, importance of utilizing pulse oximetry during exercise, safety while exercising, and options of places to exercise outside of rehab.    MET Level  Group instruction provided by PowerPoint, verbal discussion, and written material to support subject matter. Instructor reviews what METs are and how to increase METs.    Pulmonary Medications Verbally interactive group education provided by instructor with focus on inhaled medications and proper administration.   Anatomy and Physiology of the Respiratory System Group instruction provided by PowerPoint, verbal discussion, and written material to support subject matter. Instructor reviews respiratory cycle and anatomical components of the respiratory system and their functions. Instructor also reviews differences in obstructive and restrictive respiratory diseases with examples of each.    Oxygen Safety Group instruction provided by PowerPoint, verbal discussion, and written material to support subject matter. There is an overview of "What is Oxygen" and "Why do we need it".  Instructor also reviews how to create a safe environment for oxygen use, the importance of using oxygen as prescribed, and the risks of noncompliance. There is a brief discussion on traveling with oxygen and  resources the patient may utilize.   Oxygen Use Group instruction provided by PowerPoint, verbal discussion, and written material to discuss how supplemental oxygen is prescribed and different types of oxygen supply systems. Resources for more information are provided.    Breathing Techniques Group instruction that is supported by demonstration and informational handouts. Instructor discusses the benefits of pursed lip and diaphragmatic breathing and detailed demonstration on how to perform both.     Risk Factor Reduction Group instruction that is supported by a PowerPoint presentation. Instructor discusses the definition of a risk factor, different risk factors for pulmonary disease, and how the heart and lungs work together.   Pulmonary Diseases Group instruction provided by PowerPoint, verbal discussion, and written material to support subject matter. Instructor gives an overview of the different type of pulmonary diseases. There is also a discussion on risk factors and symptoms as well as ways to manage the diseases.   Stress and Energy Conservation Group instruction provided by PowerPoint, verbal discussion, and written material to support subject matter. Instructor gives an overview of stress and the  impact it can have on the body. Instructor also reviews ways to reduce stress. There is also a discussion on energy conservation and ways to conserve energy throughout the day.   Warning Signs and Symptoms Group instruction provided by PowerPoint, verbal discussion, and written material to support subject matter. Instructor reviews warning signs and symptoms of stroke, heart attack, cold and flu. Instructor also reviews ways to prevent the spread of infection.   Other Education Group or individual verbal, written, or video instructions that support the educational goals of the pulmonary rehab program.    Knowledge Questionnaire Score:  Knowledge Questionnaire Score - 04/01/23 1344        Knowledge Questionnaire Score   Pre Score 16/18             Core Components/Risk Factors/Patient Goals at Admission:  Personal Goals and Risk Factors at Admission - 04/01/23 1320       Core Components/Risk Factors/Patient Goals on Admission   Improve shortness of breath with ADL's Yes    Intervention Provide education, individualized exercise plan and daily activity instruction to help decrease symptoms of SOB with activities of daily living.    Expected Outcomes Short Term: Improve cardiorespiratory fitness to achieve a reduction of symptoms when performing ADLs;Long Term: Be able to perform more ADLs without symptoms or delay the onset of symptoms    Increase knowledge of respiratory medications and ability to use respiratory devices properly  Yes    Expected Outcomes Short Term: Achieves understanding of medications use. Understands that oxygen is a medication prescribed by physician. Demonstrates appropriate use of inhaler and oxygen therapy.;Long Term: Maintain appropriate use of medications, inhalers, and oxygen therapy.             Core Components/Risk Factors/Patient Goals Review:   Goals and Risk Factor Review     Row Name 04/05/23 1107             Core Components/Risk Factors/Patient Goals Review   Personal Goals Review Improve shortness of breath with ADL's;Develop more efficient breathing techniques such as purse lipped breathing and diaphragmatic breathing and practicing self-pacing with activity.;Increase knowledge of respiratory medications and ability to use respiratory devices properly.       Review We are unable to assess Nain's goals at this time. [place] is scheduled to start the program on 04/09/23.       Expected Outcomes See admission goals                Core Components/Risk Factors/Patient Goals at Discharge (Final Review):   Goals and Risk Factor Review - 04/05/23 1107       Core Components/Risk Factors/Patient Goals Review    Personal Goals Review Improve shortness of breath with ADL's;Develop more efficient breathing techniques such as purse lipped breathing and diaphragmatic breathing and practicing self-pacing with activity.;Increase knowledge of respiratory medications and ability to use respiratory devices properly.    Review We are unable to assess Phyllip's goals at this time. Abdulelah is scheduled to start the program on 04/09/23.    Expected Outcomes See admission goals             ITP Comments: Pt is making expected progress toward Pulmonary Rehab goals after completing 1 session(s). Recommend continued exercise, life style modification, education, and utilization of breathing techniques to increase stamina and strength, while also decreasing shortness of breath with exertion.  Dr. Mechele Collin is Medical Director for Pulmonary Rehab at [facility].

## 2023-04-11 ENCOUNTER — Encounter (HOSPITAL_COMMUNITY)
Admission: RE | Admit: 2023-04-11 | Discharge: 2023-04-11 | Disposition: A | Discharge status: Home / Self Care | Payer: Medicare Other | Source: Ambulatory Visit | Attending: Pulmonary Disease | Admitting: Pulmonary Disease

## 2023-04-11 VITALS — Wt 142.4 lb

## 2023-04-11 DIAGNOSIS — J449 Chronic obstructive pulmonary disease, unspecified: Secondary | ICD-10-CM

## 2023-04-11 LAB — GLUCOSE, CAPILLARY
Glucose-Capillary: 156 mg/dL — ABNORMAL HIGH (ref 70–99)
Glucose-Capillary: 105 mg/dL — ABNORMAL HIGH (ref 70–99)

## 2023-04-11 NOTE — Progress Notes (Signed)
Daily Session Note  Patient Details  Name: [name] MRN: [id number] Date of Birth: [birth year] Referring Provider:   Doristine Devoid Pulmonary Rehab Walk Test from 04/01/2023 in [facility]  Referring Provider Vassie Loll       Encounter Date: 04/11/2023  Check In:  Session Check In - 04/11/23 1114       Check-In   Supervising physician immediately available to respond to emergencies [facility] MD immediately available    Physician(s) Neila Gear, NP    Location [facility]    Staff Present Essie Hart, RN, BSN;Vanisha Whiten, Zella Richer, MS, ACSM-CEP, Exercise Physiologist;David Manus Gunning, MS, ACSM-CEP, CCRP, Exercise Physiologist;Bailey Wallace Cullens, MS, Exercise Physiologist    Virtual Visit No    Medication changes reported     No    Fall or balance concerns reported    No    Tobacco Cessation No Change    Warm-up and Cool-down Performed as group-led instruction    Resistance Training Performed Yes    VAD Patient? No    PAD/SET Patient? No      Pain Assessment   Currently in Pain? No/denies    Pain Score 0-No pain    Multiple Pain Sites No             Capillary Blood Glucose: Results for orders placed or performed during the hospital encounter of 04/11/23 (from the past 24 hours)  Glucose, capillary     Status: Abnormal   Collection Time: 04/11/23 11:31 AM  Result Value Ref Range   Glucose-Capillary 105 (H) 70 - 99 mg/dL      Social History   Tobacco Use  Smoking Status Former   Current packs/day: 0.00   Average packs/day: 1 pack/day for 43.1 years (43.1 ttl pk-yrs)   Types: Cigarettes   Start date: 12/25/1963   Quit date: 01/31/2007   Years since quitting: 16.2  Smokeless Tobacco Never    Goals Met:  Proper associated with RPD/PD & O2 Sat Independence with exercise equipment Exercise tolerated well No report of concerns or symptoms today Strength training completed today  Goals  Unmet:  Not Applicable  Comments: Service time is from 1011 to 1145.    Dr. Mechele Collin is Medical Director for Pulmonary Rehab at [facility].

## 2023-04-15 ENCOUNTER — Telehealth: Payer: Self-pay | Admitting: Physician Assistant

## 2023-04-15 DIAGNOSIS — J90 Pleural effusion, not elsewhere classified: Secondary | ICD-10-CM | POA: Diagnosis not present

## 2023-04-15 DIAGNOSIS — R9389 Abnormal findings on diagnostic imaging of other specified body structures: Secondary | ICD-10-CM | POA: Diagnosis not present

## 2023-04-15 DIAGNOSIS — I509 Heart failure, unspecified: Secondary | ICD-10-CM | POA: Diagnosis not present

## 2023-04-15 DIAGNOSIS — I4891 Unspecified atrial fibrillation: Secondary | ICD-10-CM | POA: Diagnosis not present

## 2023-04-15 NOTE — Telephone Encounter (Signed)
Called and spoke to [place]. Verified patient's name and DOB. She reports patient is having SOB and increased weight gain.Patient has gained 4 lb over the last week. She states it is worse on exertion. She checked his O2 Sat 95 while resting. Patient has an appointment with Dr Renne Crigler today for a chest Xray. Patient was diagnosed with RSV on 12/20 and admitted. She will call back to give an update.

## 2023-04-15 NOTE — Telephone Encounter (Signed)
Will evaluate in office. I need to exam [name] in office

## 2023-04-15 NOTE — Telephone Encounter (Signed)
Pt c/o Shortness Of Breath: STAT if SOB developed within the last 24 hours or pt is noticeably SOB on the phone  1. Are you currently SOB (can you hear that pt is SOB on the phone)? No   2. How long have you been experiencing SOB? Few days   3. Are you SOB when sitting or when up moving around? Moving around   4. Are you currently experiencing any other symptoms? Pt spouse states pt has been SOB and gaining weight. Please advise.

## 2023-04-15 NOTE — Telephone Encounter (Signed)
Called and spoke to patient's wife. Verified name and DOB. Below message relayed. No questions at this time.    [name], [place]  (3:30 PM)  Will evaluate in office. I need to exam [name] in office

## 2023-04-16 ENCOUNTER — Encounter: Payer: Self-pay | Admitting: Cardiology

## 2023-04-16 ENCOUNTER — Encounter (HOSPITAL_COMMUNITY): Payer: Medicare Other

## 2023-04-16 ENCOUNTER — Ambulatory Visit: Payer: Medicare Other | Attending: Cardiology | Admitting: Cardiology

## 2023-04-16 VITALS — BP 108/70 | HR 108 | Ht 66.0 in | Wt 147.0 lb

## 2023-04-16 DIAGNOSIS — R0602 Shortness of breath: Secondary | ICD-10-CM

## 2023-04-16 DIAGNOSIS — I10 Essential (primary) hypertension: Secondary | ICD-10-CM | POA: Diagnosis not present

## 2023-04-16 DIAGNOSIS — I480 Paroxysmal atrial fibrillation: Secondary | ICD-10-CM | POA: Diagnosis not present

## 2023-04-16 DIAGNOSIS — I25810 Atherosclerosis of coronary artery bypass graft(s) without angina pectoris: Secondary | ICD-10-CM

## 2023-04-16 DIAGNOSIS — R600 Localized edema: Secondary | ICD-10-CM

## 2023-04-16 MED ORDER — METOPROLOL TARTRATE 25 MG PO TABS: 25.0000 mg | ORAL_TABLET | Freq: Two times a day (BID) | ORAL | 3 refills | Status: DC

## 2023-04-16 MED ORDER — FUROSEMIDE 40 MG PO TABS: 40.0000 mg | ORAL_TABLET | Freq: Every day | ORAL | 3 refills | Status: DC

## 2023-04-16 NOTE — Telephone Encounter (Signed)
Pt spouse called in persistent about speaking to a nurse only. Please advise.

## 2023-04-16 NOTE — Patient Instructions (Signed)
Medication Instructions:  Stop Cardizem Stop Aspirin Start Metoprolol Tartrate 25 mg twice a day Increase Lasix to 40 mg once a day Continue Eliquis 5 mg twice a day *If you need a refill on your cardiac medications before your next appointment, please call your pharmacy*  Lab Work: No labs  Testing/Procedures: Your physician has requested that you have an echocardiogram. Echocardiography is a painless test that uses sound waves to create images of your heart. It provides your doctor with information about the size and shape of your heart and how well your heart's chambers and valves are working. This procedure takes approximately one hour. There are no restrictions for this procedure. Please do NOT wear cologne, perfume, aftershave, or lotions (deodorant is allowed). Please arrive 15 minutes prior to your appointment time.  Please note: We ask at that you not bring children with you during ultrasound (echo/ vascular) testing. Due to room size and safety concerns, children are not allowed in the ultrasound rooms during exams. Our front office staff cannot provide observation of children in our lobby area while testing is being conducted. An adult accompanying a patient to their appointment will only be allowed in the ultrasound room at the discretion of the ultrasound technician under special circumstances. We apologize for any inconvenience.  Follow-Up: At [facility], you and your health needs are our priority.  As part of our continuing mission to provide you with exceptional heart care, we have created designated Provider Care Teams.  These Care Teams include your primary Cardiologist (physician) and Advanced Practice Providers (APPs -  Physician Assistants and Nurse Practitioners) who all work together to provide you with the care you need, when you need it.  We recommend signing up for the patient portal called "MyChart".  Sign up information is provided on this After Visit  Summary.  MyChart is used to connect with patients for Virtual Visits (Telemedicine).  Patients are able to view lab/test results, encounter notes, upcoming appointments, etc.  Non-urgent messages can be sent to your provider as well.   To learn more about what you can do with MyChart, go to [email or web address].    Your next appointment:   February 3rd at 11:20 am with [name], PA-C

## 2023-04-16 NOTE — Telephone Encounter (Signed)
Called and spoke to patient's wife. Verified name and DOB. She report patient continue to be SOB. Patient was seen yesterday and started on Lasix and Potassium by Dr Renne Crigler. They did not pick up and start medication yesterday. She is going to get medication this morning. I did ask if patient was worse than yesterday, if patient is struggling to breathe and if patient is having to stop between words to catch his breath when talking and she deny. Patient is scheduled for an appt at 2:45 with [name], NP. Advised her per ED precautions if patient gets worse or develops new symptoms that he should be seen in the ED. She stated she was a Engineer, civil (consulting) and know what to watch for.

## 2023-04-16 NOTE — Progress Notes (Signed)
Cardiology Office Note    Date:  04/16/2023  ID:  [name], DOB 02-Sep-1940, MRN [id number] PCP:  Merri Brunette, MD  Cardiologist:  Chrystie Nose, MD  Electrophysiologist:  Regan Lemming, MD   Chief Complaint: Shortness of breath   History of Present Illness: [name]    [name] is a 83 y.o. male with visit-pertinent history of CAD s/p emergent CABG in 2008, PAF, SVT, hypertension, hyperlipidemia, type 2 diabetes melitis and COPD.  He has previously been followed by Dr. [name] and Dr. Elberta Fortis.  Patient has history of significant bruising on anticoagulation therapy, patient and Dr. Elberta Fortis ultimately decided to switch him to aspirin.  He is felt to be a potential candidate for Watchman device or loop recorder however patient preferred conservative management.  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  He was evaluated by EP service for possible scar mediated VT.  It was recommended for improved A-fib control prior to assessing SVT.  He was last seen by Dr. Elberta Fortis in 01/2022 at which time he was doing well.   He was last seen in clinic on 04/18/2022 for follow-up.  He remained stable from a cardiac standpoint.  He denied chest pain, shortness of breath, lower extremity edema, orthopnea or PND.  He reported becoming dyspneic with more strenuous activity but not regular activity.  It was recommended that he have follow-up in 1 year.  On chart review patient was admitted from 03/08/2023 through 03/11/2023 for RSV/pneumonia and acute hypoxia.  He was started on oral steroids with improvement.  Today he presents regarding increased shortness of breath and weight gain of greater than 5 pounds in a week.  Patient reports that he was seen by his PCP yesterday for his annual physical, was noted that his weight had increased significantly, patient noted worsening shortness of breath.  He has been working at  pulmonary rehab but feels that his shortness of breath has worsened since starting.  A chest x-ray indicated pleural effusions, unfortunately I am unable to review results.  Patient's proBNP was elevated and he was started on Lasix.  On review of patient's paperwork he brought with him from appointment it was also noted that he was in atrial fibrillation and started on Eliquis.  Today patient denies chest pain.  He notes increased dyspnea on exertion, specifically when climbing stairs as well as weight gain.  He has not noted significant lower extremity edema, his wife feels that his abdomen is slightly more swollen than normal.  He denies orthopnea, palpitations or PND.  Labwork independently reviewed: 04/15/2023: Hemoglobin 12.6, hematocrit 38.8, creatinine 0.9, sodium 142, potassium 4.3, AST 26, ALT 23, TSH 2.53, NT-proBNP 3609  ROS: .   Today he denies chest pain, palpitations, melena, hematuria, hemoptysis, diaphoresis,  presyncope, syncope, orthopnea, and PND.  All other systems are reviewed and otherwise negative. Studies Reviewed: [name]    EKG:  EKG is ordered today, personally reviewed, demonstrating  EKG Interpretation Date/Time:  Tuesday April 16 2023 15:00:18 EST Ventricular Rate:  108 PR Interval:    QRS Duration:  104 QT Interval:  378 QTC Calculation: 506 R Axis:   35  Text Interpretation: Atrial fibrillation with rapid ventricular response Low voltage QRS Incomplete right bundle branch block Nonspecific ST and T wave abnormality Confirmed by [name] [phone]) on 04/16/2023 3:02:38 PM    CV Studies:  Cardiac Studies & Procedures  STRESS TESTS  MYOCARDIAL PERFUSION IMAGING 11/25/2018  Narrative  Nuclear stress EF: 43%.  There was no ST segment deviation noted during stress.  No T wave inversion was noted during stress.  Findings consistent with prior myocardial infarction.  This is a low risk study.  The left ventricular ejection fraction is moderately decreased  (30-44%).  There is a fixed perfusion defect of moderate severity present on rest and stress imaging, located in the anterior apical/apical segments.  This defect is of moderate size, comprising 5-8% of the LV.  A similar defect was reported on the prior study from 2016, and there appears to be no change.  This likely represents prior infarction.  There is no reversible ischemia on the study.  The ejection fraction is reported at 43%, which is likely the lower limits of normal for this modality of testing.  Wall motion appears normal on this study.  Overall, this is likely a low risk study with evidence of prior infarction that was seen in 2016, and no reversible ischemia.  ECHOCARDIOGRAM  ECHOCARDIOGRAM COMPLETE 03/08/2016  Narrative *[facility]* [address] [place], [place] [address] [phone]  ------------------------------------------------------------------- Transthoracic Echocardiography  Patient:    Jonathan, Duran MR #:       [id number] Study Date: 03/08/2016 Gender:     M Age:        68 Height:     167.6 cm Weight:     68.7 kg BSA:        1.8 m^2 Pt. Status: Room:  ATTENDING    Zoila Shutter MD [facility]     Zoila Shutter MD REFERRING    Zoila Shutter MD SONOGRAPHER  Clearence Ped, RCS PERFORMING   [facility], Outpatient  cc:  ------------------------------------------------------------------- LV EF: 50% -   55%  ------------------------------------------------------------------- Indications:      Cardiomyopathy (I25.5).  ------------------------------------------------------------------- Study Conclusions  - Left ventricle: The cavity size was normal. There was mild concentric hypertrophy. Systolic function was normal. The estimated ejection fraction was in the range of 50% to 55%. Wall motion was normal; there were no regional wall motion abnormalities. Doppler parameters are consistent with abnormal left ventricular relaxation (grade 1 diastolic  dysfunction). The E/e&' ratio is between 8-15, suggesting indeterminate LV filling pressure./ - Mitral valve: Mildly thickened leaflets . There was mild regurgitation. - Left atrium: The atrium was normal in size. - Right ventricle: The cavity size was mildly dilated. Systolic function is mildly reduced. - Right atrium: The atrium was mildly dilated. - Tricuspid valve: There was trivial regurgitation. - Pulmonary arteries: PA peak pressure: 28 mm Hg (S). - Inferior vena cava: The vessel was normal in size. The respirophasic diameter changes were in the normal range (= 50%), consistent with normal central venous pressure.  Impressions:  - LVEF 50-55%, mild LVH, basal inferior hypokinesis, diastolic dysfunction, indeterminate LV filling pressure, normal LA size, mild LAE, trivial TR, normal RVSP, normal IVC  ------------------------------------------------------------------- Study data:  No prior study was available for comparison.  Study status:  Routine.  Procedure:  The patient reported no pain pre or post test. Transthoracic echocardiography. Image quality was adequate.          Transthoracic echocardiography.  M-mode, complete 2D, spectral Doppler, and color Doppler.  Birthdate: Patient birthdate: 11-24-1940.  Age:  Patient is 83 yr old.  Sex: Gender: male.    BMI: 24.4 kg/m^2.  Blood pressure:     136/80 Patient status:  Outpatient.  Study date:  Study date: 03/08/2016. Study  time: 02:16 PM.  Location:  [facility]  -------------------------------------------------------------------  ------------------------------------------------------------------- Left ventricle:  The cavity size was normal. There was mild concentric hypertrophy. Systolic function was normal. The estimated ejection fraction was in the range of 50% to 55%. Wall motion was normal; there were no regional wall motion abnormalities. Doppler parameters are consistent with abnormal left ventricular  relaxation (grade 1 diastolic dysfunction). The E/e&' ratio is between 8-15, suggesting indeterminate LV filling pressure./  ------------------------------------------------------------------- Aortic valve:   Structurally normal valve. Trileaflet. Cusp separation was normal.  Doppler:  Transvalvular velocity was within the normal range. There was no stenosis. There was no regurgitation.  ------------------------------------------------------------------- Aorta:  Aortic root: The aortic root was normal in size. Ascending aorta: The ascending aorta was normal in size.  ------------------------------------------------------------------- Mitral valve:   Mildly thickened leaflets .  Doppler:  There was mild regurgitation.  ------------------------------------------------------------------- Left atrium:  The atrium was normal in size.  ------------------------------------------------------------------- Atrial septum:  No defect or patent foramen ovale was identified.  ------------------------------------------------------------------- Right ventricle:  The cavity size was mildly dilated. Systolic function is mildly reduced.  ------------------------------------------------------------------- Pulmonic valve:    The valve appears to be grossly normal. Doppler:  There was no significant regurgitation.  ------------------------------------------------------------------- Tricuspid valve:   Doppler:  There was trivial regurgitation.  ------------------------------------------------------------------- Pulmonary artery:   The main pulmonary artery was normal-sized.  ------------------------------------------------------------------- Right atrium:  The atrium was mildly dilated.  ------------------------------------------------------------------- Pericardium:  There was no pericardial effusion.  ------------------------------------------------------------------- Systemic  veins: Inferior vena cava: The vessel was normal in size. The respirophasic diameter changes were in the normal range (= 50%), consistent with normal central venous pressure. Diameter: 9.98 mm.  ------------------------------------------------------------------- Measurements  IVC                                         Value        Reference ID                                          9.98  mm     ---------  Left ventricle                              Value        Reference LV ID, ED, PLAX chordal                     50    mm     43 - 52 LV ID, ES, PLAX chordal                     33.1  mm     23 - 38 LV fx shortening, PLAX chordal              34    %      >=29 LV PW thickness, ED                         11.8  mm     --------- IVS/LV PW ratio, ED                         0.91         <=  1.3 Stroke volume, 2D                           70    ml     --------- Stroke volume/bsa, 2D                       39    ml/m^2 --------- LV ejection fraction, 1-p A4C               51    %      --------- LV e&', lateral                              7.57  cm/s   --------- LV E/e&', lateral                            8.86         --------- LV e&', medial                               5.7   cm/s   --------- LV E/e&', medial                             11.77        --------- LV e&', average                              6.64  cm/s   --------- LV E/e&', average                            10.11        ---------  Ventricular septum                          Value        Reference IVS thickness, ED                           10.7  mm     ---------  LVOT                                        Value        Reference LVOT ID, S                                  21    mm     --------- LVOT area                                   3.46  cm^2   --------- LVOT peak velocity, S                       87.2  cm/s   --------- LVOT mean velocity, S  54.1  cm/s   --------- LVOT VTI, S                                  20.3  cm     --------- LVOT peak gradient, S                       3     mm Hg  ---------  Aorta                                       Value        Reference Aortic root ID, ED                          33    mm     ---------  Left atrium                                 Value        Reference LA ID, A-P, ES                              41    mm     --------- LA ID/bsa, A-P                      (H)     2.28  cm/m^2 <=2.2 LA volume, S                                49    ml     --------- LA volume/bsa, S                            27.3  ml/m^2 --------- LA volume, ES, 1-p A4C                      44    ml     --------- LA volume/bsa, ES, 1-p A4C                  24.5  ml/m^2 --------- LA volume, ES, 1-p A2C                      52    ml     --------- LA volume/bsa, ES, 1-p A2C                  28.9  ml/m^2 ---------  Mitral valve                                Value        Reference Mitral E-wave peak velocity                 67.1  cm/s   --------- Mitral A-wave peak velocity                 98.7  cm/s   --------- Mitral deceleration time  215   ms     150 - 230 Mitral E/A ratio, peak                      0.7          ---------  Pulmonary arteries                          Value        Reference PA pressure, S, DP                          28    mm Hg  <=30  Tricuspid valve                             Value        Reference Tricuspid regurg peak velocity              252   cm/s   --------- Tricuspid peak RV-RA gradient               25    mm Hg  --------- Tricuspid maximal regurg velocity,          252   cm/s   --------- PISA  Systemic veins                              Value        Reference Estimated CVP                               3     mm Hg  ---------  Right ventricle                             Value        Reference RV pressure, S, DP                          28    mm Hg  <=30 RV s&', lateral, S                           9.98  cm/s    ---------  Legend: (L)  and  (H)  mark values outside specified reference range.  ------------------------------------------------------------------- Prepared and Electronically Authenticated by  Zoila Shutter MD 2017-12-21T15:55:13   MONITORS  LONG TERM MONITOR (3-14 DAYS) 11/13/2018  Narrative Moderate burden of PVCs and PACs, intermittent SVT and short runs of NSVT noted.  Chrystie Nose, MD, [facility], FACP [place]  [facility] Medical Director of the [facility] Lipid Disorders [facility] Cardiovascular Risk Reduction [facility] Diplomate of the American Board of Clinical Lipidology Attending Cardiologist Direct Dial: [phone]  Fax: [phone] Website:  [email or web address]           Current Reported Medications:.    Current Meds  Medication Sig   albuterol (VENTOLIN HFA) 108 (90 Base) MCG/ACT inhaler Inhale 2 puffs into the lungs every 6 (six) hours as needed for wheezing or shortness of breath.   Biotin 29562 MCG TABS Take 1 tablet by mouth See admin instructions. Take 10,000 mcg by mouth once a day   Cholecalciferol (VITAMIN D3) 25  MCG (1000 UT) CAPS Take 1,000 Units by mouth daily.   Cyanocobalamin (VITAMIN B-12 SL) Place 1 tablet under the tongue daily.   ELIQUIS 5 MG TABS tablet Take 5 mg by mouth 2 (two) times daily.   folic acid (FOLVITE) 1 MG tablet Take 1 mg by mouth daily.   furosemide (LASIX) 40 MG tablet Take 1 tablet (40 mg total) by mouth daily.   metFORMIN (GLUCOPHAGE) 500 MG tablet Take 500 mg by mouth daily with breakfast.   metoprolol tartrate (LOPRESSOR) 25 MG tablet Take 1 tablet (25 mg total) by mouth 2 (two) times daily.   pravastatin (PRAVACHOL) 40 MG tablet Take 1 tablet (40 mg total) by mouth daily.   SYMBICORT 80-4.5 MCG/ACT inhaler INHALE 2 PUFFS BY MOUTH IN THE MORNING AND 2 PUFFS AT BEDTIME (Patient taking differently: Inhale 2 puffs into the lungs in the morning and at bedtime.)   [DISCONTINUED] diltiazem (CARDIZEM CD) 180 MG 24 hr capsule TAKE 1  CAPSULE(180 MG) BY MOUTH DAILY (Patient taking differently: Take 180 mg by mouth in the morning.)   Physical Exam:    VS:  BP 108/70 (BP Location: Right Arm, Patient Position: Sitting, Cuff Size: Normal)   Pulse (!) 108   Ht 5\' 6"  (1.676 m)   Wt 147 lb (66.7 kg)   SpO2 96%   BMI 23.73 kg/m    Wt Readings from Last 3 Encounters:  04/16/23 147 lb (66.7 kg)  04/09/23 141 lb 5 oz (64.1 kg)  04/01/23 140 lb 10.5 oz (63.8 kg)    GEN: Well nourished, well developed in no acute distress NECK: +JVD; No carotid bruits CARDIAC: Irregular RR, no murmurs, rubs, gallops RESPIRATORY:  Diminished lung sounds in bilateral bases, without rales, wheezing or rhonchi  ABDOMEN: Soft, non-tender, non-distended EXTREMITIES:  Mild bilateral lower extremity edema; No acute deformity   Asessement and Plan:.    PAF: History of PAF, patient and Dr. Elberta Fortis previously agreed to transition from anticoagulation to aspirin.  At appointment with his PCP yesterday it was noted that he was in atrial fibrillation, he was restarted on Eliquis 5 mg twice daily.  Today patient's EKG indicates atrial fibrillation at 108 bpm.  He denies any feelings of palpitations, notes increased fatigue and shortness of breath.  As he has had no recent echo and has fluid retention will stop Cardizem and start on metoprolol tartrate 25 mg twice daily. Discussed with Dr. Bjorn Pippin (DOD at [facility]), may need to consider cardioversion on follow up. Continue Eliquis. Reviewed ED precautions.  CHA2DS2-VASc Score = 5 [CHF History: 0, HTN History: 1, Diabetes History: 1, Stroke History: 0, Vascular Disease History: 1, Age Score: 2, Gender Score: 0].  Therefore, the patient's annual risk of stroke is 7.2 %.      Shortness of breath: Patient notes that his breathing had slightly improved following his recent hospitalization with RSV, he notes however that recently his shortness of breath has worsened.  He becomes more short of breath when climbing  stairs or with activity. He denies orthopnea or pnd. He has JVD as well as mild bilateral lower extremity edema. Chest x-ray at his PCPs office yesterday indicated pleural effusions according to patient's AVS from office visit yesterday. NT-ProBNP elevated at 3609. Discussed with Dr. Bjorn Pippin DOD at [facility] today, will discontinue Cardizem, increase Lasix to 40 mg daily, continue potassium 10 meq daily and start metoprolol tartrate 25 mg twice daily. Reviewed ED precautions. Check echocardiogram.   COPD: Followed by Dr. Vassie Loll with pulmonology.  Currently in pulmonary rehab. Patient to follow up with pulmonology tomorrow.   Hypertension: Blood pressure today 108/70.  Stop Cardizem, start metoprolol tartrate 25 mg twice daily. Encouraged to monitor his blood pressure at home.   CAD: s/p emergency CABG in 2008.  Patient denies chest pain today.    Disposition: F/u with [name], PA on 04/22/23 (pt request)   Signed, Rip Harbour, NP

## 2023-04-17 ENCOUNTER — Ambulatory Visit (HOSPITAL_BASED_OUTPATIENT_CLINIC_OR_DEPARTMENT_OTHER): Payer: Medicare Other | Admitting: Pulmonary Disease

## 2023-04-17 ENCOUNTER — Encounter (HOSPITAL_BASED_OUTPATIENT_CLINIC_OR_DEPARTMENT_OTHER): Payer: Self-pay | Admitting: Pulmonary Disease

## 2023-04-17 VITALS — BP 104/64 | HR 73 | Temp 98.1°F | Ht 66.0 in | Wt 143.9 lb

## 2023-04-17 DIAGNOSIS — J439 Emphysema, unspecified: Secondary | ICD-10-CM

## 2023-04-17 DIAGNOSIS — J90 Pleural effusion, not elsewhere classified: Secondary | ICD-10-CM

## 2023-04-17 DIAGNOSIS — I509 Heart failure, unspecified: Secondary | ICD-10-CM | POA: Diagnosis not present

## 2023-04-17 DIAGNOSIS — I4891 Unspecified atrial fibrillation: Secondary | ICD-10-CM | POA: Diagnosis not present

## 2023-04-17 NOTE — Patient Instructions (Signed)
  x chest x-ray in 2 weeks to follow-up on pleural effusion/fluid around the lungs  Okay to resume pulmonary rehab next week unless cardiology advises against

## 2023-04-17 NOTE — Progress Notes (Signed)
Subjective:    Patient ID: [name], male    DOB: [birth year], 83 y.o.   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  43-pack-year smoking history   PMH : CAD SP CABG, PAF, type II DM, prostate cancer  02/2023 hospitalized for RSV infection/COPD exacerbation, discharged on oxygen  Chief Complaint  Patient presents with   Follow-up    Some SOB with walking dog or climbing stairs and more fatigue   Discussed the use of AI scribe software for clinical note transcription with the patient, who gave verbal consent to proceed.  History of Present Illness   The patient, with a history of RSV, presents with shortness of breath and weight gain. He reports that he had been recovering well from the RSV and had returned his supplemental oxygen. However, he believes he overexerted himself while walking his dog uphill, leading to the current symptoms. He has also noticed an irregular heartbeat.  The patient's spouse reports that the patient saw a cardiologist recently, who diagnosed him with congestive heart failure and atrial fibrillation. The patient has gained five pounds in less than a week and has been started on a higher dose of Lasix and metoprolol + eliquis. He has also been advised to start taking Eliquis, which he had previously stopped taking due to a bleeding episode.  The patient has a history of brief episodes of atrial fibrillation, one following CABG surgery in 2008 and another during a visit to his primary care physician. He reports that he does not feel when he goes in and out of atrial fibrillation.  The patient also reports some edema in his feet and a distended abdomen. He has lost about half of the weight he recently gained.   CXR 03/21/23 reviewed - small Rt effusion        Hospitalized with RSV pneumonia. Currently 90% recovered, O2 returned   Significant tests/ events reviewed   CT chest  08/2018 Moderate centrilobular emphysema. Platelike scarring in the inferior right upper lobe    PFT 06/2016: FVC 3.03 L (87%) FEV1 1.61 L (65%) FEV1/FVC 0.53  positive bronchodilator response 02/2016: FVC 2.74 L (78%) FEV1 1.49 L (60%) FEV1/FVC 0.54  positive bronchodilator response 12/2015: FVC 3.23 L (92%) FEV1 1.47 L (58%) FEV1/FVC 0.45  TLC 8.31 L (133%) RV 203% ERV 218% DLCO uncorrected 45%     LABS 02/03/16 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) RAST Panel:  Cat Dander 27.5 / D farinae 9.29 / Dog Dander 6.74 / Multiple Positives IgE:  689  Review of Systems neg for any significant sore throat, dysphagia, itching, sneezing, nasal congestion or excess/ purulent secretions, fever, chills, sweats, unintended wt loss, pleuritic or exertional cp, hempoptysis, orthopnea pnd or change in chronic leg swelling. Also denies presyncope, palpitations, heartburn, abdominal pain, nausea, vomiting, diarrhea or change in bowel or urinary habits, dysuria,hematuria, rash, arthralgias, visual complaints, headache, numbness weakness or ataxia.     Objective:   Physical Exam   Gen. Pleasant, well-nourished, in no distress ENT - no thrush, no pallor/icterus,no post nasal drip Neck: No JVD, no thyromegaly, no carotid bruits Lungs: no use of accessory muscles, no dullness to percussion, decreased right base without rales or rhonchi  Cardiovascular: Rhythm irregular, heart sounds  normal, no murmurs or gallops, no peripheral edema Musculoskeletal: No deformities, no cyanosis or clubbing       Assessment & Plan:  Assessment and Plan    Congestive Heart Failure (CHF) CHF exacerbated by overexertion, presenting with dyspnea, weight gain, and pitting edema. Chest x-ray showed pleural effusion. Increased Lasix dosage to manage fluid retention. Abdomen distended, indicating fluid overload. - Continue Lasix as prescribed - Monitor weight and fluid status - Repeat chest x-ray in two weeks - Continue potassium  supplementation  Atrial Fibrillation (AFib) Recurrent AFib, currently rate-controlled with metoprolol. Prescribed Eliquis for stroke prevention despite previous reluctance due to facial bruising. Asymptomatic AFib episodes make monitoring challenging. Eliquis is more effective than aspirin in preventing strokes. - Continue Eliquis for stroke prevention - Continue metoprolol for rate control - Monitor heart rate, especially during physical activity - Use a smart watch for continuous heart rate monitoring - Restart pulmonary rehab next week, ensuring heart rate does not exceed 130 bpm  General Health Maintenance Scheduled for an echocardiogram in February to assess cardiac function. Planning eyelid surgery in March, requiring temporary discontinuation of Eliquis. - Schedule echocardiogram in February - Consult with cardiologist before discontinuing Eliquis for eyelid surgery in March  Follow-up - Follow up with cardiologist in February - Follow up with pulmonologist as needed.      Right pleural effusion -small, related to heart failure secondary to A-fib, repeat chest x-ray in 2 weeks after diuresis

## 2023-04-18 ENCOUNTER — Encounter (HOSPITAL_COMMUNITY): Payer: Medicare Other

## 2023-04-18 DIAGNOSIS — H02834 Dermatochalasis of left upper eyelid: Secondary | ICD-10-CM | POA: Diagnosis not present

## 2023-04-18 DIAGNOSIS — E119 Type 2 diabetes mellitus without complications: Secondary | ICD-10-CM | POA: Diagnosis not present

## 2023-04-18 DIAGNOSIS — H02035 Senile entropion of left lower eyelid: Secondary | ICD-10-CM | POA: Diagnosis not present

## 2023-04-18 DIAGNOSIS — H00011 Hordeolum externum right upper eyelid: Secondary | ICD-10-CM | POA: Diagnosis not present

## 2023-04-18 DIAGNOSIS — H02032 Senile entropion of right lower eyelid: Secondary | ICD-10-CM | POA: Diagnosis not present

## 2023-04-18 DIAGNOSIS — H43813 Vitreous degeneration, bilateral: Secondary | ICD-10-CM | POA: Diagnosis not present

## 2023-04-18 DIAGNOSIS — H02831 Dermatochalasis of right upper eyelid: Secondary | ICD-10-CM | POA: Diagnosis not present

## 2023-04-18 DIAGNOSIS — H0102A Squamous blepharitis right eye, upper and lower eyelids: Secondary | ICD-10-CM | POA: Diagnosis not present

## 2023-04-18 DIAGNOSIS — H0102B Squamous blepharitis left eye, upper and lower eyelids: Secondary | ICD-10-CM | POA: Diagnosis not present

## 2023-04-18 DIAGNOSIS — Z961 Presence of intraocular lens: Secondary | ICD-10-CM | POA: Diagnosis not present

## 2023-04-18 DIAGNOSIS — H26493 Other secondary cataract, bilateral: Secondary | ICD-10-CM | POA: Diagnosis not present

## 2023-04-22 ENCOUNTER — Ambulatory Visit: Payer: Medicare Other | Attending: Physician Assistant | Admitting: Physician Assistant

## 2023-04-22 ENCOUNTER — Encounter: Payer: Self-pay | Admitting: Physician Assistant

## 2023-04-22 VITALS — BP 108/76 | HR 116 | Ht 66.0 in | Wt 140.6 lb

## 2023-04-22 DIAGNOSIS — I25810 Atherosclerosis of coronary artery bypass graft(s) without angina pectoris: Secondary | ICD-10-CM | POA: Diagnosis not present

## 2023-04-22 DIAGNOSIS — I5032 Chronic diastolic (congestive) heart failure: Secondary | ICD-10-CM

## 2023-04-22 DIAGNOSIS — I4819 Other persistent atrial fibrillation: Secondary | ICD-10-CM | POA: Diagnosis not present

## 2023-04-22 DIAGNOSIS — I10 Essential (primary) hypertension: Secondary | ICD-10-CM

## 2023-04-22 DIAGNOSIS — I509 Heart failure, unspecified: Secondary | ICD-10-CM | POA: Diagnosis not present

## 2023-04-22 MED ORDER — ELIQUIS 5 MG PO TABS: 5.0000 mg | ORAL_TABLET | Freq: Two times a day (BID) | ORAL | 5 refills | Status: DC

## 2023-04-22 MED ORDER — ELIQUIS 5 MG PO TABS: 5.0000 mg | ORAL_TABLET | Freq: Two times a day (BID) | ORAL | Status: DC

## 2023-04-22 MED ORDER — METOPROLOL TARTRATE 50 MG PO TABS: 50.0000 mg | ORAL_TABLET | Freq: Two times a day (BID) | ORAL | 3 refills | Status: DC

## 2023-04-22 MED ORDER — ELIQUIS 5 MG PO TABS: 5.0000 mg | ORAL_TABLET | Freq: Two times a day (BID) | ORAL | 0 refills | Status: DC

## 2023-04-22 NOTE — Patient Instructions (Signed)
Medication Instructions:  INCREASE METOPROLOL TARTRATE TO 50 MG TWICE DAILY *If you need a refill on your cardiac medications before your next appointment, please call your pharmacy*   Lab Work: NO LABS If you have labs (blood work) drawn today and your tests are completely normal, you will receive your results only by: MyChart Message (if you have MyChart) OR A paper copy in the mail If you have any lab test that is abnormal or we need to change your treatment, we will call you to review the results.   Testing/Procedures: NO TESTING   Follow-Up: At [facility], you and your health needs are our priority.  As part of our continuing mission to provide you with exceptional heart care, we have created designated Provider Care Teams.  These Care Teams include your primary Cardiologist (physician) and Advanced Practice Providers (APPs -  Physician Assistants and Nurse Practitioners) who all work together to provide you with the care you need, when you need it.   Your next appointment:   1 week(s)  Provider:   [name], PA    Other Instructions Provided 4 boxes of Eliquis samples today.

## 2023-04-22 NOTE — Progress Notes (Unsigned)
Cardiology Office Note:  .   Date:  04/23/2023  ID:  [name], DOB February 13, 1941, MRN [id number] PCP: Merri Brunette, MD  [facility] Providers Cardiologist:  Chrystie Nose, MD Electrophysiologist:  Regan Lemming, MD     History of Present Illness: [name]   [name] is a 83 y.o. male with PMH of CAD s/p CABG 2008, PAF, SVT, HTN, HLD, DM II and COPD followed by Dr. Vassie Loll of pulmonology service. He is being followed by both Dr. [name] and Dr. Elberta Fortis. He had significant bruising on anticoagulation therapy, after discussion between the patient and Dr. Elberta Fortis, it was ultimately decided to switch him to aspirin. He was felt to be a potential candidate for Watchman device or loop recorder, although patient preferred conservative management. 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 He was seen by EP service at the time for evaluation possible scar mediated VT. It was recommended for improved A-fib control prior to assessing SVT. He was last seen by Judy Pimple, PA-C in August 2022 at which time he noticed occasional shortness of breath when carrying things upstairs but otherwise no issue doing normal activity. Watchman device was discussed with the patient again at that time, however he wished to continue conservative management.   He was admitted in December 2024 with RSV/pneumonia and acute hypoxia.  He was started on oral steroid with improvement.  More recently, patient was seen by [name] on 04/16/2023 for worsening shortness of breath and waking.  He was working with pulmonary rehab however felt his shortness of breath has worsened. Chest x-ray indicated pleural effusion.  proBNP was elevated.  He was started on Lasix.  According to paperwork that he brought with him during the last visit, it appears he was noted to be back in atrial fibrillation during office visit with PCP on 04/15/2023 and started  back on the Eliquis.  Cardizem was discontinued and patient was started on metoprolol tartrate.  Lasix increased to 40 mg daily.  Patient presents today along with wife.  Unfortunately, he is currently in atrial fibrillation with RVR with a heart rate of 116 bpm.  He was able to lose 7 pounds after starting on the higher dose of diuretic.  He appears to be euvolemic on exam.  I recommended increase his metoprolol to 50 mg daily.  We discussed various options including way to 3 weeks with weight control therapy to consider outpatient DCCV, more urgent TEE DCCV if he has recurrent heart failure or hospitalization and inpatient TEE DCCV.  Since he is no longer volume overloaded, we eventually decided to avoid hospitalization.  I am hoping that his heart rate will be better controlled after metoprolol is increased.  I will call him this Friday to make sure his heart rate and blood pressure is stable.  I plan to see the patient back in 1 week with repeat EKG to make sure heart rate is under good control.  He likely has been in A-fib for at least a few weeks, although EKG obtained in December during the hospitalization was in sinus rhythm.  ROS:   Patient continued to have fatigue, his shortness of breath has improved.  He denies any chest pain.  He has no direct cardiac awareness of atrial fibrillation.  Studies Reviewed: [name]   EKG Interpretation Date/Time:  Monday April 22 2023 11:08:05 EST Ventricular Rate:  116 PR Interval:  QRS Duration:  122 QT Interval:  364 QTC Calculation: 505 R Axis:   17  Text Interpretation: Atrial fibrillation with rapid ventricular response with premature ventricular or aberrantly conducted complexes Right bundle branch block Septal infarct , age undetermined When compared with ECG of 16-Apr-2023 15:00, Septal infarct is now Present Nonspecific T wave abnormality, improved in Lateral leads Confirmed by [name] [phone]) on 04/22/2023 4:32:07 PM    Cardiac Studies &  Procedures     STRESS TESTS  MYOCARDIAL PERFUSION IMAGING 11/25/2018  Narrative  Nuclear stress EF: 43%.  There was no ST segment deviation noted during stress.  No T wave inversion was noted during stress.  Findings consistent with prior myocardial infarction.  This is a low risk study.  The left ventricular ejection fraction is moderately decreased (30-44%).  There is a fixed perfusion defect of moderate severity present on rest and stress imaging, located in the anterior apical/apical segments.  This defect is of moderate size, comprising 5-8% of the LV.  A similar defect was reported on the prior study from 2016, and there appears to be no change.  This likely represents prior infarction.  There is no reversible ischemia on the study.  The ejection fraction is reported at 43%, which is likely the lower limits of normal for this modality of testing.  Wall motion appears normal on this study.  Overall, this is likely a low risk study with evidence of prior infarction that was seen in 2016, and no reversible ischemia.  ECHOCARDIOGRAM  ECHOCARDIOGRAM COMPLETE 03/08/2016  Narrative *[facility]* [address] [place], [place] [address] [phone]  ------------------------------------------------------------------- Transthoracic Echocardiography  Patient:    [name] MR #:       [id number] Study Date: 03/08/2016 Gender:     M Age:        56 Height:     167.6 cm Weight:     68.7 kg BSA:        1.8 m^2 Pt. Status: Room:  ATTENDING    Zoila Shutter MD [facility]     Zoila Shutter MD REFERRING    Zoila Shutter MD SONOGRAPHER  Clearence Ped, RCS PERFORMING   [facility], Outpatient  cc:  ------------------------------------------------------------------- LV EF: 50% -   55%  ------------------------------------------------------------------- Indications:      Cardiomyopathy (I25.5).  ------------------------------------------------------------------- Study  Conclusions  - Left ventricle: The cavity size was normal. There was mild concentric hypertrophy. Systolic function was normal. The estimated ejection fraction was in the range of 50% to 55%. Wall motion was normal; there were no regional wall motion abnormalities. Doppler parameters are consistent with abnormal left ventricular relaxation (grade 1 diastolic dysfunction). The E/e&' ratio is between 8-15, suggesting indeterminate LV filling pressure./ - Mitral valve: Mildly thickened leaflets . There was mild regurgitation. - Left atrium: The atrium was normal in size. - Right ventricle: The cavity size was mildly dilated. Systolic function is mildly reduced. - Right atrium: The atrium was mildly dilated. - Tricuspid valve: There was trivial regurgitation. - Pulmonary arteries: PA peak pressure: 28 mm Hg (S). - Inferior vena cava: The vessel was normal in size. The respirophasic diameter changes were in the normal range (= 50%), consistent with normal central venous pressure.  Impressions:  - LVEF 50-55%, mild LVH, basal inferior hypokinesis, diastolic dysfunction, indeterminate LV filling pressure, normal LA size, mild LAE, trivial TR, normal RVSP, normal IVC  ------------------------------------------------------------------- Study data:  No prior study was available for comparison.  Study status:  Routine.  Procedure:  The patient reported no pain pre or post test. Transthoracic echocardiography. Image quality was adequate.          Transthoracic echocardiography.  M-mode, complete 2D, spectral Doppler, and color Doppler.  Birthdate: Patient birthdate: 1940-04-06.  Age:  Patient is 83 yr old.  Sex: Gender: male.    BMI: 24.4 kg/m^2.  Blood pressure:     136/80 Patient status:  Outpatient.  Study date:  Study date: 03/08/2016. Study time: 02:16 PM.  Location:  [facility]  -------------------------------------------------------------------  ------------------------------------------------------------------- Left ventricle:  The cavity size was normal. There was mild concentric hypertrophy. Systolic function was normal. The estimated ejection fraction was in the range of 50% to 55%. Wall motion was normal; there were no regional wall motion abnormalities. Doppler parameters are consistent with abnormal left ventricular relaxation (grade 1 diastolic dysfunction). The E/e&' ratio is between 8-15, suggesting indeterminate LV filling pressure./  ------------------------------------------------------------------- Aortic valve:   Structurally normal valve. Trileaflet. Cusp separation was normal.  Doppler:  Transvalvular velocity was within the normal range. There was no stenosis. There was no regurgitation.  ------------------------------------------------------------------- Aorta:  Aortic root: The aortic root was normal in size. Ascending aorta: The ascending aorta was normal in size.  ------------------------------------------------------------------- Mitral valve:   Mildly thickened leaflets .  Doppler:  There was mild regurgitation.  ------------------------------------------------------------------- Left atrium:  The atrium was normal in size.  ------------------------------------------------------------------- Atrial septum:  No defect or patent foramen ovale was identified.  ------------------------------------------------------------------- Right ventricle:  The cavity size was mildly dilated. Systolic function is mildly reduced.  ------------------------------------------------------------------- Pulmonic valve:    The valve appears to be grossly normal. Doppler:  There was no significant regurgitation.  ------------------------------------------------------------------- Tricuspid valve:   Doppler:  There was trivial  regurgitation.  ------------------------------------------------------------------- Pulmonary artery:   The main pulmonary artery was normal-sized.  ------------------------------------------------------------------- Right atrium:  The atrium was mildly dilated.  ------------------------------------------------------------------- Pericardium:  There was no pericardial effusion.  ------------------------------------------------------------------- Systemic veins: Inferior vena cava: The vessel was normal in size. The respirophasic diameter changes were in the normal range (= 50%), consistent with normal central venous pressure. Diameter: 9.98 mm.  ------------------------------------------------------------------- Measurements  IVC                                         Value        Reference ID                                          9.98  mm     ---------  Left ventricle                              Value        Reference LV ID, ED, PLAX chordal                     50    mm     43 - 52 LV ID, ES, PLAX chordal                     33.1  mm     23 - 38 LV fx shortening, PLAX chordal  34    %      >=29 LV PW thickness, ED                         11.8  mm     --------- IVS/LV PW ratio, ED                         0.91         <=1.3 Stroke volume, 2D                           70    ml     --------- Stroke volume/bsa, 2D                       39    ml/m^2 --------- LV ejection fraction, 1-p A4C               51    %      --------- LV e&', lateral                              7.57  cm/s   --------- LV E/e&', lateral                            8.86         --------- LV e&', medial                               5.7   cm/s   --------- LV E/e&', medial                             11.77        --------- LV e&', average                              6.64  cm/s   --------- LV E/e&', average                            10.11        ---------  Ventricular septum                           Value        Reference IVS thickness, ED                           10.7  mm     ---------  LVOT                                        Value        Reference LVOT ID, S                                  21    mm     --------- LVOT area  3.46  cm^2   --------- LVOT peak velocity, S                       87.2  cm/s   --------- LVOT mean velocity, S                       54.1  cm/s   --------- LVOT VTI, S                                 20.3  cm     --------- LVOT peak gradient, S                       3     mm Hg  ---------  Aorta                                       Value        Reference Aortic root ID, ED                          33    mm     ---------  Left atrium                                 Value        Reference LA ID, A-P, ES                              41    mm     --------- LA ID/bsa, A-P                      (H)     2.28  cm/m^2 <=2.2 LA volume, S                                49    ml     --------- LA volume/bsa, S                            27.3  ml/m^2 --------- LA volume, ES, 1-p A4C                      44    ml     --------- LA volume/bsa, ES, 1-p A4C                  24.5  ml/m^2 --------- LA volume, ES, 1-p A2C                      52    ml     --------- LA volume/bsa, ES, 1-p A2C                  28.9  ml/m^2 ---------  Mitral valve                                Value  Reference Mitral E-wave peak velocity                 67.1  cm/s   --------- Mitral A-wave peak velocity                 98.7  cm/s   --------- Mitral deceleration time                    215   ms     150 - 230 Mitral E/A ratio, peak                      0.7          ---------  Pulmonary arteries                          Value        Reference PA pressure, S, DP                          28    mm Hg  <=30  Tricuspid valve                             Value        Reference Tricuspid regurg peak velocity              252   cm/s   --------- Tricuspid peak  RV-RA gradient               25    mm Hg  --------- Tricuspid maximal regurg velocity,          252   cm/s   --------- PISA  Systemic veins                              Value        Reference Estimated CVP                               3     mm Hg  ---------  Right ventricle                             Value        Reference RV pressure, S, DP                          28    mm Hg  <=30 RV s&', lateral, S                           9.98  cm/s   ---------  Legend: (L)  and  (H)  mark values outside specified reference range.  ------------------------------------------------------------------- Prepared and Electronically Authenticated by  Zoila Shutter MD 2017-12-21T15:55:13   MONITORS  LONG TERM MONITOR (3-14 DAYS) 11/13/2018  Narrative Moderate burden of PVCs and PACs, intermittent SVT and short runs of NSVT noted.  Chrystie Nose, MD, [facility], FACP [place]  [facility] Medical Director of the [facility] Lipid Disorders [facility] Cardiovascular Risk Reduction [facility] Diplomate of the American Board of Clinical Lipidology Attending Cardiologist Direct Dial: [phone]  Fax: [phone] Website:  [email or web address]  Risk Assessment/Calculations:    CHA2DS2-VASc Score = 6    This indicates a 9.7% annual risk of stroke. The patient's score is based upon: CHF History: 1 HTN History: 1 Diabetes History: 1 Stroke History: 0 Vascular Disease History: 1 Age Score: 2 Gender Score: 0            Physical Exam:   VS:  BP 108/76 (BP Location: Left Arm, Patient Position: Sitting, Cuff Size: Normal)   Pulse (!) 116   Ht 5\' 6"  (1.676 m)   Wt 140 lb 9.6 oz (63.8 kg)   SpO2 95%   BMI 22.69 kg/m    Wt Readings from Last 3 Encounters:  04/22/23 140 lb 9.6 oz (63.8 kg)  04/17/23 143 lb 14.4 oz (65.3 kg)  04/16/23 147 lb (66.7 kg)    GEN: Well nourished, well developed in no acute distress NECK: No JVD; No carotid bruits CARDIAC: Irregularly irregular, no murmurs,  rubs, gallops RESPIRATORY:  Clear to auscultation without rales, wheezing or rhonchi  ABDOMEN: Soft, non-tender, non-distended EXTREMITIES:  No edema; No deformity   ASSESSMENT AND PLAN: .     Persistent Atrial Fibrillation with RVR Recurrent despite previous management with Eliquis and Cardizem. Recent switch to Metoprolol due to high heart rate. Discussed options for cardioversion and potential for ablation with electrophysiologist. -Continue Eliquis as prescribed without interruption. -Increase Metoprolol to 50mg  twice daily for better rate control. -Monitor blood pressure closely at home, report if systolic blood pressure drops below 95. -Schedule follow-up in 1 week to assess heart rate control. -Consider TEE DCCV if heart rate remains uncontrolled.  Heart Failure Evidence of fluid overload with weight gain and elevated ProBNP. -Lasix was increased to 40 mg daily during the last office visit.  He has managed to lose 7 pounds.  Coronary Artery Disease History of CABG in 2008 and MI with EF 43% on Myoview in 2020. -Continue current management. -Schedule echocardiogram in 2 weeks to assess current cardiac function.  Hypertension: Increase metoprolol to 50 mg twice a day for better rate control       Dispo: Follow-up in 1 week  Signed, [name], PA

## 2023-04-23 ENCOUNTER — Encounter (HOSPITAL_COMMUNITY): Admission: RE | Admit: 2023-04-23 | Payer: Medicare Other | Source: Ambulatory Visit

## 2023-04-23 ENCOUNTER — Telehealth: Payer: Self-pay | Admitting: Physician Assistant

## 2023-04-23 NOTE — Telephone Encounter (Signed)
 Patient c/o Palpitations:  STAT if patient reporting lightheadedness, shortness of breath, or chest pain  How long have you had palpitations/irregular HR/ Afib? Are you having the symptoms now? 2 weeks; Yes   Are you currently experiencing lightheadedness, SOB or CP? SOB  Do you have a history of afib (atrial fibrillation) or irregular heart rhythm? Yes   Have you checked your BP or HR? (document readings if available): 96/67; 111  Are you experiencing any other symptoms? Really weak

## 2023-04-23 NOTE — Telephone Encounter (Signed)
Paper Work Dropped Off: Labs   Date: 04/23/2023  Location of paper:  [employer]

## 2023-04-23 NOTE — Telephone Encounter (Signed)
 Patient identification verified by 2 forms. Bertina Cooks, RN    Called and spoke to patient/wife [name] Patient/[name] states:   -after taking medications this morning he felt very weak   -since eating and drinking he is feeling a little better   -would like to know about pursuing treatment for A-fib   -heart rate remains in the 110's   -at 11:00am BP 96/68  -took metoprolol  at 8:15am   -Typically BP remains in the 90s when at home  Informed patient and [name]:  -Message sent to provider   -check BP prior to giving metoprolol , if SBP<100 hold and recheck in 1 hour  Patient and [name] verbalized understanding, no questions at this time

## 2023-04-24 NOTE — Telephone Encounter (Signed)
 I spoke with the patient, he reports he is still doing ok at this time, however HR is clearly still not very well controlled despite increasing rate control therapy. Options left would be either outpatient TEE DCCV or hospitalization. Patient request I call him back in 1 hour when his wife come back to rediscuss the issue. I will call him then.

## 2023-04-24 NOTE — Telephone Encounter (Signed)
 Jonathan Vinie BROCKS, MD    He probably needs an attempt at cardioversion - may need to switch to amiodarone . Scot saw him 2 days ago and has another appt on 2/11.  If [name] is feeling worse, he can come to the ER - I'm not sure if the metoprolol  is the issue or the afib that is making him weak.  Dr. Mona

## 2023-04-24 NOTE — Telephone Encounter (Signed)
 Spoke with the patient's wife, discussed various options, at this time, waiting 3 weeks pursuing rate control is less feasible.  He has not been short of breath, he is no longer volume overloaded during the office visit.  Heart rate is still in the 110s, blood pressure borderline low in the high 90s.  We discussed possibility of TEE DCCV versus hospitalization, his current condition is good enough that he may not need hospitalization immediately.  His wife dropped off his recent blood work your office late yesterday before closing, I have not had the chance to stop by the office to take a look.  I will review his blood work tomorrow morning.  His wife is also interested in TEE DCCV at this time.  Risk and benefit of the procedure has been discussed with the patient including 1 in 10,000 chance of acute respiratory failure, severe bradycardia, severe hypotension, aspiration event, esophageal bleeding/hematoma/perforation.  Dr. Mona please review, would you also be okay with proceeding with TEE DCCV.  Given borderline blood pressure, I likely will ask him to hold his metoprolol  in the morning of the cardioversion.  If he has recurrence of A-fib despite a successful TEE DCCV, will consider addition of amiodarone  therapy +/- ablation in the future.

## 2023-04-24 NOTE — Telephone Encounter (Signed)
 Pt's spouse requesting cb to further discuss

## 2023-04-24 NOTE — Telephone Encounter (Signed)
 Patient identification verified by 2 forms. Bertina Cooks, RN    Called and spoke to patient  Relayed provider message below  Advised patient:   -continue to monitor symptoms at home   -presented ED if symptoms worsen   -keep 2/11 OV for follow up  Patient verbalized understanding, no questions at this time

## 2023-04-24 NOTE — Telephone Encounter (Signed)
 Returned call to Wife, discussed that Hao will review before appointment and discuss at that time. Will forward message for his review

## 2023-04-25 ENCOUNTER — Encounter (HOSPITAL_COMMUNITY): Payer: Self-pay | Admitting: Cardiovascular Disease

## 2023-04-25 ENCOUNTER — Emergency Department (HOSPITAL_COMMUNITY): Payer: Medicare Other

## 2023-04-25 ENCOUNTER — Inpatient Hospital Stay (HOSPITAL_COMMUNITY)
Admission: EM | Admit: 2023-04-25 | Discharge: 2023-04-30 | DRG: 291 | Disposition: A | Discharge status: Home / Self Care | Payer: Medicare Other | Attending: Internal Medicine | Admitting: Internal Medicine

## 2023-04-25 ENCOUNTER — Other Ambulatory Visit: Payer: Self-pay

## 2023-04-25 ENCOUNTER — Encounter (HOSPITAL_COMMUNITY): Payer: Medicare Other

## 2023-04-25 DIAGNOSIS — I472 Ventricular tachycardia, unspecified: Secondary | ICD-10-CM | POA: Diagnosis not present

## 2023-04-25 DIAGNOSIS — I9589 Other hypotension: Secondary | ICD-10-CM | POA: Diagnosis not present

## 2023-04-25 DIAGNOSIS — Z7901 Long term (current) use of anticoagulants: Secondary | ICD-10-CM | POA: Diagnosis not present

## 2023-04-25 DIAGNOSIS — I5021 Acute systolic (congestive) heart failure: Secondary | ICD-10-CM | POA: Diagnosis not present

## 2023-04-25 DIAGNOSIS — E785 Hyperlipidemia, unspecified: Secondary | ICD-10-CM | POA: Diagnosis present

## 2023-04-25 DIAGNOSIS — I5033 Acute on chronic diastolic (congestive) heart failure: Secondary | ICD-10-CM | POA: Diagnosis not present

## 2023-04-25 DIAGNOSIS — Z7984 Long term (current) use of oral hypoglycemic drugs: Secondary | ICD-10-CM

## 2023-04-25 DIAGNOSIS — I252 Old myocardial infarction: Secondary | ICD-10-CM | POA: Diagnosis not present

## 2023-04-25 DIAGNOSIS — R600 Localized edema: Secondary | ICD-10-CM | POA: Diagnosis not present

## 2023-04-25 DIAGNOSIS — Z823 Family history of stroke: Secondary | ICD-10-CM | POA: Diagnosis not present

## 2023-04-25 DIAGNOSIS — Z87891 Personal history of nicotine dependence: Secondary | ICD-10-CM | POA: Diagnosis not present

## 2023-04-25 DIAGNOSIS — I493 Ventricular premature depolarization: Secondary | ICD-10-CM | POA: Diagnosis present

## 2023-04-25 DIAGNOSIS — J4489 Other specified chronic obstructive pulmonary disease: Secondary | ICD-10-CM | POA: Diagnosis present

## 2023-04-25 DIAGNOSIS — I10 Essential (primary) hypertension: Secondary | ICD-10-CM | POA: Diagnosis present

## 2023-04-25 DIAGNOSIS — I255 Ischemic cardiomyopathy: Secondary | ICD-10-CM | POA: Diagnosis present

## 2023-04-25 DIAGNOSIS — Z85828 Personal history of other malignant neoplasm of skin: Secondary | ICD-10-CM | POA: Diagnosis not present

## 2023-04-25 DIAGNOSIS — Z833 Family history of diabetes mellitus: Secondary | ICD-10-CM

## 2023-04-25 DIAGNOSIS — I5043 Acute on chronic combined systolic (congestive) and diastolic (congestive) heart failure: Secondary | ICD-10-CM | POA: Diagnosis not present

## 2023-04-25 DIAGNOSIS — Z8546 Personal history of malignant neoplasm of prostate: Secondary | ICD-10-CM

## 2023-04-25 DIAGNOSIS — I081 Rheumatic disorders of both mitral and tricuspid valves: Secondary | ICD-10-CM | POA: Diagnosis not present

## 2023-04-25 DIAGNOSIS — Z7951 Long term (current) use of inhaled steroids: Secondary | ICD-10-CM | POA: Diagnosis not present

## 2023-04-25 DIAGNOSIS — I4510 Unspecified right bundle-branch block: Secondary | ICD-10-CM | POA: Diagnosis present

## 2023-04-25 DIAGNOSIS — I361 Nonrheumatic tricuspid (valve) insufficiency: Secondary | ICD-10-CM | POA: Diagnosis not present

## 2023-04-25 DIAGNOSIS — E119 Type 2 diabetes mellitus without complications: Secondary | ICD-10-CM | POA: Diagnosis present

## 2023-04-25 DIAGNOSIS — Z79899 Other long term (current) drug therapy: Secondary | ICD-10-CM

## 2023-04-25 DIAGNOSIS — I110 Hypertensive heart disease with heart failure: Secondary | ICD-10-CM | POA: Diagnosis not present

## 2023-04-25 DIAGNOSIS — I4891 Unspecified atrial fibrillation: Secondary | ICD-10-CM

## 2023-04-25 DIAGNOSIS — I2510 Atherosclerotic heart disease of native coronary artery without angina pectoris: Secondary | ICD-10-CM | POA: Diagnosis present

## 2023-04-25 DIAGNOSIS — J9811 Atelectasis: Secondary | ICD-10-CM | POA: Diagnosis not present

## 2023-04-25 DIAGNOSIS — R0602 Shortness of breath: Secondary | ICD-10-CM | POA: Diagnosis not present

## 2023-04-25 DIAGNOSIS — Z820 Family history of epilepsy and other diseases of the nervous system: Secondary | ICD-10-CM

## 2023-04-25 DIAGNOSIS — Z8249 Family history of ischemic heart disease and other diseases of the circulatory system: Secondary | ICD-10-CM

## 2023-04-25 DIAGNOSIS — R0989 Other specified symptoms and signs involving the circulatory and respiratory systems: Secondary | ICD-10-CM | POA: Diagnosis not present

## 2023-04-25 DIAGNOSIS — R918 Other nonspecific abnormal finding of lung field: Secondary | ICD-10-CM | POA: Diagnosis not present

## 2023-04-25 DIAGNOSIS — Z951 Presence of aortocoronary bypass graft: Secondary | ICD-10-CM

## 2023-04-25 DIAGNOSIS — I340 Nonrheumatic mitral (valve) insufficiency: Secondary | ICD-10-CM | POA: Diagnosis not present

## 2023-04-25 DIAGNOSIS — I4819 Other persistent atrial fibrillation: Secondary | ICD-10-CM | POA: Diagnosis present

## 2023-04-25 LAB — CBC
HCT: 40.6 % (ref 39.0–52.0)
Hemoglobin: 13 g/dL (ref 13.0–17.0)
MCH: 29.3 pg (ref 26.0–34.0)
MCHC: 32 g/dL (ref 30.0–36.0)
MCV: 91.4 fL (ref 80.0–100.0)
Platelets: 238 10*3/uL (ref 150–400)
RBC: 4.44 MIL/uL (ref 4.22–5.81)
RDW: 13 % (ref 11.5–15.5)
WBC: 9.3 10*3/uL (ref 4.0–10.5)
nRBC: 0 % (ref 0.0–0.2)

## 2023-04-25 LAB — BASIC METABOLIC PANEL
Anion gap: 11 (ref 5–15)
BUN: 36 mg/dL — ABNORMAL HIGH (ref 8–23)
CO2: 25 mmol/L (ref 22–32)
Calcium: 8.7 mg/dL — ABNORMAL LOW (ref 8.9–10.3)
Chloride: 101 mmol/L (ref 98–111)
Creatinine, Ser: 1.23 mg/dL (ref 0.61–1.24)
GFR, Estimated: 58 mL/min — ABNORMAL LOW (ref >60)
Glucose, Bld: 271 mg/dL — ABNORMAL HIGH (ref 70–99)
Potassium: 4 mmol/L (ref 3.5–5.1)
Sodium: 137 mmol/L (ref 135–145)

## 2023-04-25 LAB — BRAIN NATRIURETIC PEPTIDE: B Natriuretic Peptide: 1246.8 pg/mL — ABNORMAL HIGH (ref 0.0–100.0)

## 2023-04-25 LAB — MAGNESIUM: Magnesium: 1.5 mg/dL — ABNORMAL LOW (ref 1.7–2.4)

## 2023-04-25 MED ORDER — DIGOXIN 0.25 MG/ML IJ SOLN
0.1250 mg | Freq: Once | INTRAMUSCULAR | Status: AC
  Administered 2023-04-25: 0.125 mg via INTRAVENOUS
  Filled 2023-04-25: qty 2

## 2023-04-25 MED ORDER — METOPROLOL TARTRATE 5 MG/5ML IV SOLN
5.0000 mg | Freq: Once | INTRAVENOUS | Status: AC
  Administered 2023-04-25: 5 mg via INTRAVENOUS
  Filled 2023-04-25: qty 5

## 2023-04-25 MED ORDER — PRAVASTATIN SODIUM 40 MG PO TABS: 40.0000 mg | ORAL_TABLET | Freq: Every day | ORAL | Status: DC

## 2023-04-25 MED ORDER — METOPROLOL TARTRATE 25 MG PO TABS
25.0000 mg | ORAL_TABLET | Freq: Once | ORAL | Status: AC
  Administered 2023-04-25: 25 mg via ORAL
  Filled 2023-04-25: qty 1

## 2023-04-25 MED ORDER — FUROSEMIDE 10 MG/ML IJ SOLN
40.0000 mg | Freq: Every day | INTRAMUSCULAR | Status: AC
  Administered 2023-04-26: 40 mg via INTRAVENOUS
  Filled 2023-04-25: qty 4

## 2023-04-25 MED ORDER — FUROSEMIDE 10 MG/ML IJ SOLN
40.0000 mg | Freq: Once | INTRAMUSCULAR | Status: AC
  Administered 2023-04-25: 40 mg via INTRAVENOUS
  Filled 2023-04-25: qty 4

## 2023-04-25 MED ORDER — APIXABAN 5 MG PO TABS
5.0000 mg | ORAL_TABLET | Freq: Two times a day (BID) | ORAL | Status: DC
  Administered 2023-04-25 – 2023-04-30 (×9): 5 mg via ORAL
  Filled 2023-04-25 (×9): qty 1

## 2023-04-25 NOTE — Telephone Encounter (Signed)
 Spoke with the patient's wife, he went to ED this morning. SBP borderline low and HR remain elevated despite increasing rate control. I agree with their decision to go to ED. I have notified cardiology team at [facility]. Awaiting ED physician evaluation

## 2023-04-25 NOTE — Telephone Encounter (Signed)
 Spoke to wife , she states  why patient is at the ER now - difficulty  very fatigue and short of breath .  She sates the have been there for 4 hours. She wanted to know if  the procedure that was discussed could be done now.  RN  informed Wife the ER physician will consult with Cardiology while patient is in the Er to evaluate what the next step would be.  RN explained the Cardiology team at hospital would handle all necessary orders once consulted not the office on out patient basis.   Wife verbalized understanding

## 2023-04-25 NOTE — ED Provider Triage Note (Signed)
 Emergency Medicine Provider Triage Evaluation Note  [name] , a 83 y.o. male  was evaluated in triage.  Pt complains of fast heart rate.  History of atrial fibrillation, currently on metoprolol , has been on Eliquis  for 2 weeks.  Cardiology is planning a TEE and cardioversion at a later date on this patient.  Was recently increased on his metoprolol  from 25-50.  Endorses continued elevated heart rate.  No chest pain.  No shortness of breath.  Review of Systems    Physical Exam  BP 106/89 (BP Location: Right Arm)   Pulse (!) 112   Temp (!) 97.4 F (36.3 C) (Oral)   Resp 14   Wt 61.2 kg   SpO2 96%   BMI 21.79 kg/m  Gen:   Awake, no distress   Resp:  Normal effort  MSK:   Moves extremities without difficulty  Other:  Cardiac-irregularly irregular rhythm  Medical Decision Making  Medically screening exam initiated at 11:43 AM.  Appropriate orders placed.  [name] was informed that the remainder of the evaluation will be completed by another provider, this initial triage assessment does not replace that evaluation, and the importance of remaining in the ED until their evaluation is complete.  EKG does show rate of 114.  Patient with normal blood pressure.  Likely needs to go up on his metoprolol .  Patient should receive an IV, receive IV rate controlling agents at the discretion of the next physician.  Recommend having the patient placed in a room.   [name] T, DO 04/25/23 1144

## 2023-04-25 NOTE — ED Provider Notes (Signed)
 [place] EMERGENCY DEPARTMENT AT [facility] Provider Note  CSN: [id number] Arrival date & time: 04/25/23 1050  Chief Complaint(s) Shortness of Breath  HPI [name] is a 83 y.o. male here today for shortness of breath.  Patient has a history of A-fib, had been following with his outpatient cardiologist, but unfortunately his heart rate had continued to be elevated in spite of his metoprolol .  Patient recently increased from 25-50.  Currently pending outpatient TEE for cardioversion.   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   02/03/2016-respiratory allergy  panel, environmental allergens to dust mites, cat dander, dog dander, Timothy grass, cockroach, fungus, cedar trees, ragweed, rough pigweed, mouse urine, IgE is 689 >>>Largest elevations are to cat dander, dog dander, dust mites  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)  06/27/2016-pulmonary function test-FVC 3.03 (87% predicted), postbronchodilator ratio 54, postbronc   Basal cell carcinoma 08/20/2016   left cheek-cx3 excision   BCC (basal cell carcinoma) 09/20/2016   left cheek-mohs   CAD (coronary artery disease)    cath & CABG in 2008   Cardiomyopathy, 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   COPD (chronic obstructive pulmonary disease) (HCC)    COPD with acute exacerbation (HCC) 04/02/2018   Diabetes (HCC)    type 2   DM2 (diabetes mellitus, type 2) (HCC) 02/05/2013   Dyslipidemia    Environmental allergies  02/03/2016   02/03/2016-respiratory allergy  panel, environmental allergens to dust mites, cat dander, dog dander, Timothy grass, cockroach, fungus, cedar trees, ragweed, rough pigweed, mouse urine, IgE is 689 >>>Largest elevations are to cat dander, dog dander, dust mites    Essential hypertension 02/03/2016   Former smoker 05/14/2018   Former smoker Quit 2008 43-pack-year smoking history   History of nuclear stress test 10/08/2007   bruce myoview ; normal scan with attenuation artifact; no ischemia/infarct; low risk    Hypertriglyceridemia 02/03/2016   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 Authorizing Provider  albuterol  (VENTOLIN  HFA) 108 (90 Base) MCG/ACT inhaler Inhale 2 puffs into the lungs every 6  (six) hours as needed for wheezing or shortness of breath. 03/11/23  Yes Jillian Buttery, MD  Cholecalciferol (VITAMIN D3) 25 MCG (1000 UT) CAPS Take 1,000 Units by mouth daily.   Yes [provider]  Cyanocobalamin (VITAMIN B-12 SL) Place 1 tablet under the tongue daily.   Yes [provider]  ELIQUIS  5 MG TABS tablet Take 1 tablet (5 mg total) by mouth 2 (two) times daily. 04/22/23  Yes Meng, Hao, PA  folic acid  (FOLVITE ) 1 MG tablet Take 1 mg by mouth daily.   Yes [provider]  furosemide  (LASIX ) 40 MG tablet Take 1 tablet (40 mg total) by mouth daily. 04/16/23 07/15/23 Yes West, Katlyn D, NP  HYDROcodone  bit-homatropine (HYCODAN) 5-1.5 MG/5ML syrup Take 5 mLs by mouth every 4 (four) hours as needed for cough. 03/11/23  Yes Jillian Buttery, MD  metFORMIN (GLUCOPHAGE) 500 MG tablet Take 500 mg by mouth daily with breakfast. 02/06/23  Yes [provider]  metoprolol  tartrate (LOPRESSOR ) 50 MG tablet Take 1 tablet (50 mg total) by mouth 2 (two) times daily. 04/22/23 07/21/23 Yes Meng, Hao, PA  pravastatin  (PRAVACHOL ) 40 MG tablet Take 1 tablet (40 mg total) by mouth daily. 02/17/14  Yes Hilty, Vinie BROCKS, MD  SYMBICORT  80-4.5 MCG/ACT inhaler INHALE 2 PUFFS BY MOUTH IN THE MORNING AND 2 PUFFS AT BEDTIME Patient taking differently: Inhale 2 puffs into the lungs in the morning and at bedtime. 11/23/22  Yes Jude Harden GAILS, MD                                                                                                                                    Past Surgical History Past Surgical History:  Procedure Laterality Date   CARDIAC CATHETERIZATION  2003   total occlusion of LAD with left to left collaterals, mod disease of RCA & mild disease in Cfx (Dr. DELENA. 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 Fleeta Ochoa)   NASAL SEPTOPLASTY W/ TURBINOPLASTY  1970s    PROSTATE BIOPSY     TONSILLECTOMY     Family History Family History  Problem Relation Age of Onset   Diabetes Mother        died of CVA at 65, also HTN   Alzheimer's disease Father    CAD Father    Heart attack Father 20       also HTN   Stroke Maternal Grandmother    Lung disease Neg Hx     Social History Social History   Tobacco Use   Smoking status: Former    Current packs/day: 0.00  Average packs/day: 1 pack/day for 43.1 years (43.1 ttl pk-yrs)    Types: Cigarettes    Start date: 12/25/1963    Quit date: 01/31/2007    Years since quitting: 16.2   Smokeless tobacco: Never  Vaping Use   Vaping status: Never Used  Substance Use Topics   Alcohol  use: Yes    Comment: few drinks a month   Drug use: No   Allergies Other and Lisinopril   Review of Systems Review of Systems  Physical Exam Vital Signs  I have reviewed the triage vital signs BP 99/76   Pulse (!) 114   Temp 97.7 F (36.5 C) (Oral)   Resp 18   Wt 61.2 kg   SpO2 100%   BMI 21.79 kg/m   Physical Exam Vitals reviewed.  Cardiovascular:     Rate and Rhythm: Tachycardia present. Rhythm irregular.  Pulmonary:     Effort: Pulmonary effort is normal.     Breath sounds: No decreased breath sounds.  Neurological:     Mental Status: He is alert.     ED Results and Treatments Labs (all labs ordered are listed, but only abnormal results are displayed) Labs Reviewed  BASIC METABOLIC PANEL - Abnormal; Notable for the following components:      Result Value   Glucose, Bld 271 (*)    BUN 36 (*)    Calcium  8.7 (*)    GFR, Estimated 58 (*)    All other components within normal limits  BRAIN NATRIURETIC PEPTIDE - Abnormal; Notable for the following components:   B Natriuretic Peptide 1,246.8 (*)    All other components within normal limits  CBC                                                                                                                          Radiology DG Chest 1 View Result  Date: 04/25/2023 CLINICAL DATA:  Shortness of breath. EXAM: CHEST  1 VIEW COMPARISON:  March 10, 2023. FINDINGS: Stable cardiomediastinal silhouette. Status post coronary artery bypass graft. Mild central pulmonary vascular congestion is noted. Mild bibasilar atelectasis or edema is noted with small pleural effusions. Bony thorax is unremarkable. IMPRESSION: Mild central pulmonary vascular congestion with mild bibasilar subsegmental atelectasis or edema with small pleural effusions. Electronically Signed   By: Lynwood Landy Raddle M.D.   On: 04/25/2023 12:30    Pertinent labs & imaging results that were available during my care of the patient were reviewed by me and considered in my medical decision making (see MDM for details).  Medications Ordered in ED Medications  furosemide  (LASIX ) injection 40 mg (has no administration in time range)  metoprolol  tartrate (LOPRESSOR ) tablet 25 mg (has no administration in time range)  metoprolol  tartrate (LOPRESSOR ) injection 5 mg (5 mg Intravenous Given 04/25/23 1553)  Procedures .Critical Care  Performed by: Mannie Fairy DASEN, DO Authorized by: Mannie Fairy DASEN, DO   Critical care provider statement:    Critical care time (minutes):  30   Critical care was necessary to treat or prevent imminent or life-threatening deterioration of the following conditions:  Cardiac failure   Critical care was time spent personally by me on the following activities:  Development of treatment plan with patient or surrogate, discussions with consultants, evaluation of patient's response to treatment, examination of patient, ordering and review of laboratory studies, ordering and review of radiographic studies, ordering and performing treatments and interventions, pulse oximetry, re-evaluation of patient's condition and review of old  charts   (including critical care time)  Medical Decision Making / ED Course   This patient presents to the ED for concern of shortness of breath and elevated heart rate, this involves an extensive number of treatment options, and is a complaint that carries with it a high risk of complications and morbidity.  The differential diagnosis includes A-fib RVR, pulmonary edema.  MDM: Patient's BNP is 3 times normal limit, has trace pleural effusions.  Was able get the patient's heart rate under control with a single dose of IV metoprolol , and p.o. metoprolol .  Did reach out to cardiology who are going to come down to evaluate the patient.  Patient would benefit from admission for management of his atrial fibrillation which is unfortunately leading to high output heart failure.  At time of admission, patient stable, not requiring supplemental O2.   Additional history obtained: -Additional history obtained from wife at bedside -External records from outside source obtained and reviewed including: Chart review including previous notes, labs, imaging, consultation notes   Lab Tests: -I ordered, reviewed, and interpreted labs.   The pertinent results include:   Labs Reviewed  BASIC METABOLIC PANEL - Abnormal; Notable for the following components:      Result Value   Glucose, Bld 271 (*)    BUN 36 (*)    Calcium  8.7 (*)    GFR, Estimated 58 (*)    All other components within normal limits  BRAIN NATRIURETIC PEPTIDE - Abnormal; Notable for the following components:   B Natriuretic Peptide 1,246.8 (*)    All other components within normal limits  CBC      EKG atrial fibrillation with rapid ventricular rate  EKG Interpretation Date/Time:  Thursday April 25 2023 11:08:14 EST Ventricular Rate:  114 PR Interval:    QRS Duration:  120 QT Interval:  360 QTC Calculation: 496 R Axis:   34  Text Interpretation: Atrial fibrillation with rapid ventricular response with premature  ventricular or aberrantly conducted complexes Right bundle branch block Septal infarct , age undetermined Abnormal ECG When compared with ECG of 22-Apr-2023 11:08, PREVIOUS ECG IS PRESENT Confirmed by Mannie Fairy [phone]) on 04/25/2023 11:12:12 AM         Imaging Studies ordered: I ordered imaging studies including chest x-ray I independently visualized and interpreted imaging. I agree with the radiologist interpretation   Medicines ordered and prescription drug management: Meds ordered this encounter  Medications   metoprolol  tartrate (LOPRESSOR ) injection 5 mg   furosemide  (LASIX ) injection 40 mg   metoprolol  tartrate (LOPRESSOR ) tablet 25 mg    -I have reviewed the patients home medicines and have made adjustments as needed  Critical interventions Management of atrial fibrillation with rapid ventricular rate  Consultations Obtained: I requested consultation with the cardiology,  and discussed lab and imaging findings as well  as pertinent plan - they recommend: Admission   Cardiac Monitoring: The patient was maintained on a cardiac monitor.  I personally viewed and interpreted the cardiac monitored which showed an underlying rhythm of: A-fib with RVR  Social Determinants of Health:  Factors impacting patients care include: Multiple medical comorbidities including poorly controlled atrial fibrillation   Reevaluation: After the interventions noted above, I reevaluated the patient and found that they have :improved  Co morbidities that complicate the patient evaluation  Past Medical History:  Diagnosis Date   Abnormal finding on lung imaging 05/14/2018   04/09/2018-chest x-ray-ill-defined peripheral right midlung airspace opacity possibly early pneumonia,  >>>follow-up PA lateral chest x-ray is recommended in 3 to 4 weeks following trial of antibiotic therapy to ensure resolution and exclude underlying malignancy  05/14/2018- chest x-ray-persistent ill-defined right lung  interstitial and airspace process possible persistent bronchopneumonia, chest C   Asthma    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   02/03/2016-respiratory allergy  panel, environmental allergens to dust mites, cat dander, dog dander, Timothy grass, cockroach, fungus, cedar trees, ragweed, rough pigweed, mouse urine, IgE is 689 >>>Largest elevations are to cat dander, dog dander, dust mites  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)  06/27/2016-pulmonary function test-FVC 3.03 (87% predicted), postbronchodilator ratio 54, postbronc   Basal cell carcinoma 08/20/2016   left cheek-cx3 excision   BCC (basal cell carcinoma) 09/20/2016   left cheek-mohs   CAD (coronary artery disease)    cath & CABG in 2008   Cardiomyopathy, 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   COPD (chronic obstructive pulmonary disease) (HCC)    COPD with acute exacerbation (HCC) 04/02/2018   Diabetes (HCC)    type 2   DM2 (diabetes mellitus, type 2) (HCC) 02/05/2013   Dyslipidemia    Environmental allergies 02/03/2016   02/03/2016-respiratory allergy  panel, environmental allergens to dust mites, cat dander, dog dander, Timothy grass, cockroach, fungus, cedar trees, ragweed, rough pigweed, mouse urine, IgE is 689 >>>Largest elevations are to cat dander, dog dander, dust mites    Essential hypertension 02/03/2016   Former smoker 05/14/2018   Former smoker Quit 2008 43-pack-year smoking history   History of nuclear stress test 10/08/2007   bruce myoview ; normal scan with attenuation artifact; no ischemia/infarct; low risk    Hypertriglyceridemia 02/03/2016   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 Obadiah - LIMA to LAD, sequential saphenous vein graft to OM1 and OM 2, SVG to posterior descending (done emergently)       Dispostion: Admission to cardiology service     Final Clinical Impression(s) / ED  Diagnoses Final diagnoses:  None     @PCDICTATION @    Mannie Pac T, DO 04/25/23 1618

## 2023-04-25 NOTE — ED Triage Notes (Signed)
 Pt presents from home for SOB, weakness. Told by PCP to present to ER given hx.   H/o afib (eliquis )

## 2023-04-25 NOTE — H&P (Addendum)
 Cardiology H&P   Patient ID: [name] MRN: [id number]; DOB: 04-Jan-1941  Admit date: 04/25/2023 Date of Consult: 04/25/2023  PCP:  Jonathan Nottingham, MD   [facility] Providers Cardiologist:  Jonathan JAYSON Maxcy, MD  Electrophysiologist:  Jonathan Gladis Norton, MD  {   Patient Profile:   [name] is a 83 y.o. 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 Albertina.  History of Present Illness:   [name] is a 83 year old male with the above history who is followed by Dr. Maxcy and Dr. Norton. Patient has a history of CAD with rmote CABG in 2008. He had transient post-op atrial fibrillation following this. He had no recurrence for many years. Monitor was ordered in 10/2018 for further evaluation of palpitations which showed moderate burden of PVCs and PACs, intermittent SVT, and short runs of non-sustained VT. Myoview  in 11/2018 showed EF of 43% with findings consistent with prior MI with fixed perfusion defect in the anterior/ apical segments. He was referred to EP for further evaluation of possible scar-mediated VT. At initial visit with Dr. Norton in 11/2018, he was noted to be in rapid atrial fibrillation. He was started on Diltiazem  and Eliquis . He thankfully converted to sinus rhythm with this and DCCV was not needed. Eliquis  was subsequently stopped due to significant bruising. It was felt that patient would be a potential candidate for a Watchman device and loop recorder for surveillance monitoring but patient declined.   He was admitted in 02/2023 with RSV/pneumonia and acute hypoxia. He was then noted to be back in atrial fibrillation at an office  visit with his PCP on 04/15/2023 and was started back on Eliquis  at that time. He was also started on Lasix  at that time due to reports of worsening shortness of breath and elevated pro BNP.  He was most recently seen by Jonathan Ford, PA-C, on 04/22/2023 at which time he was still in atrial fibrillation with rates in the 110s. His volume status looked better at that time. His Metoprolol  was increased and plan was for outpatient DCCV after 3 weeks of uninterrupted anticoagulation. However, patient was advised that if his heart rate remained uncontrolled or he had worsening shortness of breath to go to the ED.  He presents today to the ED for further evaluation of shortness of breath and weakness. Upon arrival to the ED, EKG showed atrial fibrillation, rate 114 bpm, with RBBB and non-specific T wave changes. BNP elevated at 1,246. Chest x-ray shows mild central pulmonary vascular congestion with mild bibasilar subsegmental atelectasis or edema with small pleural effusions. WBC 9.3, Hgb 13.0, Plts 238. Na 137, K 4.0, Glucose 271, BUN 36, Cr 1.23.  At the time of this evaluation, patient is resting comfortably in no acute distress. He states that since he was seen in the office earlier this week he has had worsening shortness of breath and weakness. He has had some some dyspnea since his admission in 02/2023 with RSV and pneumonia but it has been worse. He reports dyspnea on exertion mostly when walking up an incline (stairs or hills); however, he has started to notice it a little when walking on a flat surface now. He states he will have  some shortness of breath when he first lays down at night but then it tends to subside. No real orthopnea or PND. He states his lower extremity edema resolved with the Lasix . He reported a 7lb weight gain at the end of 03/2023 but states he lost all this with the Lasix . However, over the last few days, he has noticed his weight is up a couple of lbs. He denies any chest pain, palpitations,  lightheadedness, dizziness, or syncope. He denies any other recent fevers or illnesses since his admission in 02/2023. His residual cough from that has resolved. No GI symptoms. No abnormal bleeding in urine or stools.  He has been given a dose of PO Lopressor  25mg  daily, IV Lopressor  5mg , and IV Lasix  40mg  in the ED. Rates currently in the 100s to 110s.   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   02/03/2016-respiratory allergy  panel, environmental allergens to dust mites, cat dander, dog dander, Timothy grass, cockroach, fungus, cedar trees, ragweed, rough pigweed, mouse urine, IgE is 689 >>>Largest elevations are to cat dander, dog dander, dust mites  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)  06/27/2016-pulmonary function test-FVC 3.03 (87% predicted), postbronchodilator ratio 54, postbronc   Basal cell carcinoma 08/20/2016   left cheek-cx3 excision   BCC (basal cell carcinoma) 09/20/2016   left cheek-mohs   CAD (coronary artery disease)    cath & CABG in 2008   Cardiomyopathy, 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   COPD (chronic obstructive pulmonary disease) (HCC)    COPD with acute exacerbation (HCC) 04/02/2018   Diabetes (HCC)    type 2   DM2 (diabetes mellitus, type 2) (HCC) 02/05/2013   Dyslipidemia    Environmental allergies 02/03/2016   02/03/2016-respiratory allergy  panel, environmental allergens to dust mites, cat dander, dog dander, Timothy grass, cockroach, fungus, cedar trees, ragweed, rough pigweed,  mouse urine, IgE is 689 >>>Largest elevations are to cat dander, dog dander, dust mites    Essential hypertension 02/03/2016   Former smoker 05/14/2018   Former smoker Quit 2008 43-pack-year smoking history   History of nuclear stress test 10/08/2007   bruce myoview ; normal scan with attenuation artifact; no ischemia/infarct; low risk    Hypertriglyceridemia 02/03/2016   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 Obadiah - LIMA to LAD, sequential saphenous vein graft to OM1 and OM 2, SVG to posterior descending (done emergently)     Past Surgical History:  Procedure Laterality Date   CARDIAC CATHETERIZATION  2003   total occlusion of LAD with left to left collaterals, mod disease of RCA & mild disease in Cfx (Dr. DELENA. 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 Fleeta Ochoa)   NASAL SEPTOPLASTY W/ TURBINOPLASTY  1970s   PROSTATE BIOPSY     TONSILLECTOMY  Home Medications:  Prior to Admission medications   Medication Sig Start Date End Date Taking? Authorizing Provider  albuterol  (VENTOLIN  HFA) 108 (90 Base) MCG/ACT inhaler Inhale 2 puffs into the lungs every 6 (six) hours as needed for wheezing or shortness of breath. 03/11/23  Yes Jillian Buttery, MD  Cholecalciferol (VITAMIN D3) 25 MCG (1000 UT) CAPS Take 1,000 Units by mouth daily.   Yes [provider]  Cyanocobalamin (VITAMIN B-12 SL) Place 1 tablet under the tongue daily.   Yes [provider]  ELIQUIS  5 MG TABS tablet Take 1 tablet (5 mg total) by mouth 2 (two) times daily. 04/22/23  Yes Meng, Hao, PA  folic acid  (FOLVITE ) 1 MG tablet Take 1 mg by mouth daily.   Yes [provider]  furosemide  (LASIX ) 40 MG tablet Take 1 tablet (40 mg total) by mouth daily.  04/16/23 07/15/23 Yes West, Katlyn D, NP  HYDROcodone  bit-homatropine (HYCODAN) 5-1.5 MG/5ML syrup Take 5 mLs by mouth every 4 (four) hours as needed for cough. 03/11/23  Yes Jillian Buttery, MD  metFORMIN (GLUCOPHAGE) 500 MG tablet Take 500 mg by mouth daily with breakfast. 02/06/23  Yes [provider]  metoprolol  tartrate (LOPRESSOR ) 50 MG tablet Take 1 tablet (50 mg total) by mouth 2 (two) times daily. 04/22/23 07/21/23 Yes Meng, Hao, PA  pravastatin  (PRAVACHOL ) 40 MG tablet Take 1 tablet (40 mg total) by mouth daily. 02/17/14  Yes Hilty, Jonathan BROCKS, MD  SYMBICORT  80-4.5 MCG/ACT inhaler INHALE 2 PUFFS BY MOUTH IN THE MORNING AND 2 PUFFS AT BEDTIME Patient taking differently: Inhale 2 puffs into the lungs in the morning and at bedtime. 11/23/22  Yes Jude Harden GAILS, MD    Inpatient Medications: Scheduled Meds:  digoxin   0.125 mg Intravenous Once    Continuous Infusions:  PRN Meds:   Allergies:    Allergies  Allergen Reactions   Other Shortness Of Breath and Other (See Comments)    Feline dander   Lisinopril  Cough    Social History:   Social History   Socioeconomic History   Marital status: Married    Spouse name: Not on file   Number of children: Not on file   Years of education: Not on file   Highest education level: Bachelor's degree (e.g., BA, AB, BS)  Occupational History   Not on file  Tobacco Use   Smoking status: Former    Current packs/day: 0.00    Average packs/day: 1 pack/day for 43.1 years (43.1 ttl pk-yrs)    Types: Cigarettes    Start date: 12/25/1963    Quit date: 01/31/2007    Years since quitting: 16.2   Smokeless tobacco: Never  Vaping Use   Vaping status: Never Used  Substance and Sexual Activity   Alcohol  use: Yes    Comment: few drinks a month   Drug use: No   Sexual activity: Not on file  Other Topics Concern   Not on file  Social History Narrative   Originally from [place], [place]. Moved to [place] in 1968. Has worked as a geophysicist/field seismologist for [employer]. He did attend college in [place] and lived in [place]. He grew up in [place]. Currently has a dog. No bird or mold exposure.    Social Drivers of Corporate Investment Banker Strain: Not on file  Food Insecurity: No Food Insecurity (03/09/2023)   Hunger Vital Sign    Worried About Running Out of Food in the Last Year: Never true  Ran Out of Food in the Last Year: Never true  Transportation Needs: No Transportation Needs (03/09/2023)   PRAPARE - Administrator, Civil Service (Medical): No    Lack of Transportation (Non-Medical): No  Physical Activity: Not on file  Stress: Not on file  Social Connections: Not on file  Intimate Partner Violence: Not At Risk (03/09/2023)   Humiliation, Afraid, Rape, and Kick questionnaire    Fear of Current or Ex-Partner: No    Emotionally Abused: No    Physically Abused: No    Sexually Abused: No    Family History:   Family History  Problem Relation Age of Onset   Diabetes Mother        died of CVA at 27, also HTN   Alzheimer's disease Father    CAD Father    Heart attack Father 6       also HTN   Stroke Maternal Grandmother    Lung disease Neg Hx      ROS:  Please see the history of present illness.  Review of Systems  Constitutional:  Negative for fever.  HENT:  Negative for congestion.   Respiratory:  Positive for shortness of breath. Negative for cough.   Cardiovascular:  Negative for chest pain, palpitations and leg swelling.  Gastrointestinal:  Negative for blood in stool, melena, nausea and vomiting.  Genitourinary:  Negative for hematuria.  Musculoskeletal:  Negative for myalgias.  Neurological:  Positive for weakness. Negative for dizziness and loss of consciousness.  Endo/Heme/Allergies:  Does not bruise/bleed easily.  Psychiatric/Behavioral:  Negative for substance abuse (remote smoking history).    Physical Exam/Data:   Vitals:   04/25/23 1537 04/25/23 1615 04/25/23 1630 04/25/23 1645  BP:  99/78  97/76 101/75  Pulse:  (!) 101 (!) 122 (!) 110  Resp:  (!) 23 (!) 25 (!) 21  Temp: 97.7 F (36.5 C)     TempSrc: Oral     SpO2:  98% 95% 95%  Weight:       No intake or output data in the 24 hours ending 04/25/23 1832    04/25/2023   11:32 AM 04/22/2023   11:01 AM 04/17/2023    2:29 PM  Last 3 Weights  Weight (lbs) 135 lb 140 lb 9.6 oz 143 lb 14.4 oz  Weight (kg) 61.236 kg 63.776 kg 65.273 kg     Body mass index is 21.79 kg/m.  General: 83 y.o. thin Caucasian male resting comfortably in no acute distress. HEENT: Normocephalic and atraumatic. Sclera clear. EOMs intact. Neck: Supple. JVD elevated. Heart: Tachycardic with irregularly irregular rhythm. No murmurs, gallops, or rubs.  Lungs: No increased work of breathing. Decreased breath sounds in bilateral bases but no significant crackles appreciated. Abdomen: Soft, non-distended, and non-tender to palpation.  MSK: Normal strength and tone for age. Extremities: No lower extremity edema.    Skin: Warm and dry. Neuro: Alert and oriented x3. No focal deficits. Psych: Normal affect. Responds appropriately.   EKG:  The EKG was personally reviewed and demonstrates:  Atrial fibrillation, rate 114 bpm, with RBBB and non-specific T wave changes Telemetry:  Telemetry was personally reviewed and demonstrates:  Atrial fibrillation with rates mostly in the 100s to 110s (briefly in the 120s to 130s). PVCs and one 3 beat run of NSVT noted.  Relevant CV Studies:  Monitor 10/2018: Moderate burden of PVCs and PACs, intermittent SVT and short runs of NSVT noted.  _______________  Myoview  11/2018:   Laboratory Data:  High Sensitivity  Troponin:  No results for input(s): TROPONINIHS in the last 720 hours.   Chemistry Recent Labs  Lab 04/25/23 1151  NA 137  K 4.0  CL 101  CO2 25  GLUCOSE 271*  BUN 36*  CREATININE 1.23  CALCIUM  8.7*  GFRNONAA 58*  ANIONGAP 11    No results for input(s): PROT, ALBUMIN, AST, ALT, ALKPHOS,  BILITOT in the last 168 hours. Lipids No results for input(s): CHOL, TRIG, HDL, LABVLDL, LDLCALC, CHOLHDL in the last 168 hours.  Hematology Recent Labs  Lab 04/25/23 1151  WBC 9.3  RBC 4.44  HGB 13.0  HCT 40.6  MCV 91.4  MCH 29.3  MCHC 32.0  RDW 13.0  PLT 238   Thyroid  No results for input(s): TSH, FREET4 in the last 168 hours.  BNP Recent Labs  Lab 04/25/23 1151  BNP 1,246.8*    DDimer No results for input(s): DDIMER in the last 168 hours.   Radiology/Studies:  DG Chest 1 View Result Date: 04/25/2023 CLINICAL DATA:  Shortness of breath. EXAM: CHEST  1 VIEW COMPARISON:  March 10, 2023. FINDINGS: Stable cardiomediastinal silhouette. Status post coronary artery bypass graft. Mild central pulmonary vascular congestion is noted. Mild bibasilar atelectasis or edema is noted with small pleural effusions. Bony thorax is unremarkable. IMPRESSION: Mild central pulmonary vascular congestion with mild bibasilar subsegmental atelectasis or edema with small pleural effusions. Electronically Signed   By: Lynwood Landy Raddle M.D.   On: 04/25/2023 12:30     Assessment and Plan:   Persistent Atrial Fibrillation Patient has had paroxysmal atrial fibrillation in the past. He had not been on anticoagulation due to significant bruising on this in the past but had not had any documented episodes of atrial fibrillation in several years until recently. He was noted to be in rapid atrial fibrillation at PCP's office on 04/15/2023 and was restarted on Eliquis . He was seen in our office on 04/22/2023 at which time he was still in rapid atrial fibrillation with rates in the 110s. Beta-blocker was increased and plan was for outpatient DCCV after 3 weeks of uninterrupted anticoagulation as long as rates were controlled and he was not having any worsening symptoms. However, he now presents with worsening dyspnea and weakness.  - He remains in atrial fibrillation. Rates currently in the 100s to  110s. - Potassium 4.0 today. Will check Magnesium and TSH. - Currently on Lopressor  50mg  twice daily at home. He received a dose of PO Lopressor  25mg  and IV Lopressor  5mg  in the ED. BP soft with systolic BP in the 90s to low 899d. - Will order home Lopressor  to start tomorrow morning.  - Will give one dose of IV Digoxin  0.125mg  given soft BP. - CHA2D2-VASc = 6 (CAD, CHF, HTN, DM, age x2). Continue Eliqiuis 5mg  twice daily. - Will admit patient with plans for TEE/ DCCV this admission. The board looks full for tomorrow but will make him NPO at midnight just in case.   Shared Decision Making/Informed Consent{ The risks [stroke, cardiac arrhythmias rarely resulting in the need for a temporary or permanent pacemaker, skin irritation or burns, esophageal damage, perforation (1:10,000 risk), bleeding, pharyngeal hematoma as well as other potential complications associated with conscious sedation including aspiration, arrhythmia, respiratory failure and death], benefits (treatment guidance, restoration of normal sinus rhythm, diagnostic support) and alternatives of a transesophageal echocardiogram guided cardioversion were discussed in detail with Mr. Deboy and he is willing to proceed.     Acute on Chronic HFmrEF Patient reports worsening dyspnea on  exertion and weight gain. BNP 1,246. Chest x-ray showed mild central pulmonary vascular congestion with mild bibasilar subsegmental atelectasis or edema and small pleural effusion. Last known EF was 43% on Myoview  in 2020. No Echo at that time for confirmation. He was given a dose of IV Lasix  40mg  in the ED and is already have urine output with this. - Appears mildly volume overloaded on exam. - Will update Echo. - Will see how he responds with the dose of IV Lasix  that he was just given and can reassess in the morning. - Continue beta-blocker as above. - Can add additional GDMT if EF is down. Will hold off on any right now given soft BP and need for  additional rate control. - Will monitor daily weight, strict I/Os, and renal function.  CAD S/p remote CABG x4 in 2008. Last Myoview  in 2020 showed evidence of prior infarct but no ischemia.  - No chest pain. - No aspirin  due to need for Eliquis . - Continue statin.  Non-Sustained VT PVCs Patient has a history of PACs/ PVCs and short runs of NSVT noted on monitor in 2020. Telemetry does show occasional PVCs and a 3 beat runs of NSVT. - Potassium 4.0. Will check Magnesium and TSH. - Continue beta-blocker as above.  Hypertension BP soft but stable.  - Continue Lopressor  as above.  Hyperlipidemia - Continue home Pravastatin  40mg  daily. - Will repeat fasting lipid panel in the morning.  Type 2 Diabetes Mellitus Hemoglobin A1c 6.4% in 02/2023. - On Metformin at home. Will hold this and place on sliding scale insulin  while here.  COPD - Continue home inhalers.   Risk Assessment/Risk Scores:    [place]  Heart Association (NYHA) Functional Class NYHA Class II  CHA2DS2-VASc Score = 6  This indicates a 9.7% annual risk of stroke. The patient's score is based upon: CHF History: 1 HTN History: 1 Diabetes History: 1 Stroke History: 0 Vascular Disease History: 1 Age Score: 2 Gender Score: 0   Code Status: Full Code  Severity of Illness: The appropriate patient status for this patient is INPATIENT. Inpatient status is judged to be reasonable and necessary in order to provide the required intensity of service to ensure the patient's safety. The patient's presenting symptoms, physical exam findings, and initial radiographic and laboratory data in the context of their chronic comorbidities is felt to place them at high risk for further clinical deterioration. Furthermore, it is not anticipated that the patient will be medically stable for discharge from the hospital within 2 midnights of admission.   * I certify that at the point of admission it is my clinical judgment that the  patient will require inpatient hospital care spanning beyond 2 midnights from the point of admission due to high intensity of service, high risk for further deterioration and high frequency of surveillance required.*   For questions or updates, please contact [facility] Please consult [email or web address] for contact info under     Signed, Lyda Colcord E Riely Oetken, PA-C  04/25/2023 6:32 PM

## 2023-04-25 NOTE — ED Notes (Signed)
 Lab states they will be able to add on the BNP to previously collected blood specimens.

## 2023-04-25 NOTE — Telephone Encounter (Signed)
 Pt spouse calling asking to speak with nurse about why they are in the ER.

## 2023-04-26 ENCOUNTER — Inpatient Hospital Stay (HOSPITAL_COMMUNITY): Payer: Medicare Other | Admitting: Anesthesiology

## 2023-04-26 ENCOUNTER — Inpatient Hospital Stay (HOSPITAL_COMMUNITY): Payer: Medicare Other

## 2023-04-26 ENCOUNTER — Inpatient Hospital Stay (HOSPITAL_COMMUNITY)
Admit: 2023-04-26 | Discharge: 2023-04-26 | Disposition: A | Discharge status: Home / Self Care | Payer: Medicare Other | Attending: Cardiovascular Disease | Admitting: Cardiovascular Disease

## 2023-04-26 ENCOUNTER — Encounter (HOSPITAL_COMMUNITY): Payer: Self-pay | Admitting: Cardiovascular Disease

## 2023-04-26 DIAGNOSIS — I5021 Acute systolic (congestive) heart failure: Secondary | ICD-10-CM

## 2023-04-26 DIAGNOSIS — I4891 Unspecified atrial fibrillation: Secondary | ICD-10-CM

## 2023-04-26 DIAGNOSIS — R0602 Shortness of breath: Secondary | ICD-10-CM | POA: Diagnosis not present

## 2023-04-26 DIAGNOSIS — I5033 Acute on chronic diastolic (congestive) heart failure: Secondary | ICD-10-CM | POA: Diagnosis not present

## 2023-04-26 DIAGNOSIS — R600 Localized edema: Secondary | ICD-10-CM | POA: Diagnosis not present

## 2023-04-26 LAB — CBG MONITORING, ED
Glucose-Capillary: 116 mg/dL — ABNORMAL HIGH (ref 70–99)
Glucose-Capillary: 154 mg/dL — ABNORMAL HIGH (ref 70–99)

## 2023-04-26 LAB — ECHOCARDIOGRAM COMPLETE
Area-P 1/2: 6.24 cm2
Calc EF: 28.1 %
Height: 66 in
MV M vel: 4 m/s
MV Peak grad: 64 mm[Hg]
Radius: 0.3 cm
S' Lateral: 4.7 cm
Single Plane A2C EF: 15.2 %
Single Plane A4C EF: 35 %
Weight: 2160 [oz_av]

## 2023-04-26 LAB — GLUCOSE, CAPILLARY
Glucose-Capillary: 112 mg/dL — ABNORMAL HIGH (ref 70–99)
Glucose-Capillary: 205 mg/dL — ABNORMAL HIGH (ref 70–99)

## 2023-04-26 LAB — BASIC METABOLIC PANEL
Anion gap: 9 (ref 5–15)
BUN: 29 mg/dL — ABNORMAL HIGH (ref 8–23)
CO2: 27 mmol/L (ref 22–32)
Calcium: 8.5 mg/dL — ABNORMAL LOW (ref 8.9–10.3)
Chloride: 105 mmol/L (ref 98–111)
Creatinine, Ser: 1.15 mg/dL (ref 0.61–1.24)
GFR, Estimated: >60 mL/min (ref >60)
Glucose, Bld: 157 mg/dL — ABNORMAL HIGH (ref 70–99)
Potassium: 3.8 mmol/L (ref 3.5–5.1)
Sodium: 141 mmol/L (ref 135–145)

## 2023-04-26 LAB — LIPID PANEL
Cholesterol: 96 mg/dL (ref 0–200)
HDL: 25 mg/dL — ABNORMAL LOW (ref >40)
LDL Cholesterol: 43 mg/dL (ref 0–99)
Total CHOL/HDL Ratio: 3.8 {ratio}
Triglycerides: 138 mg/dL (ref <150)
VLDL: 28 mg/dL (ref 0–40)

## 2023-04-26 LAB — TSH: TSH: 2.475 u[IU]/mL (ref 0.350–4.500)

## 2023-04-26 MED ORDER — AMIODARONE LOAD VIA INFUSION
150.0000 mg | Freq: Once | INTRAVENOUS | Status: AC
  Administered 2023-04-26: 150 mg via INTRAVENOUS
  Filled 2023-04-26: qty 83.34

## 2023-04-26 MED ORDER — INSULIN ASPART 100 UNIT/ML IJ SOLN
0.0000 [IU] | Freq: Three times a day (TID) | INTRAMUSCULAR | Status: DC
  Administered 2023-04-26 – 2023-04-27 (×2): 2 [IU] via SUBCUTANEOUS
  Administered 2023-04-27 – 2023-04-28 (×3): 1 [IU] via SUBCUTANEOUS
  Administered 2023-04-28: 2 [IU] via SUBCUTANEOUS
  Administered 2023-04-28 – 2023-04-29 (×3): 1 [IU] via SUBCUTANEOUS
  Administered 2023-04-29: 2 [IU] via SUBCUTANEOUS

## 2023-04-26 MED ORDER — ALBUTEROL SULFATE (2.5 MG/3ML) 0.083% IN NEBU
2.5000 mg | INHALATION_SOLUTION | Freq: Four times a day (QID) | RESPIRATORY_TRACT | Status: DC | PRN
  Administered 2023-04-29: 2.5 mg via RESPIRATORY_TRACT

## 2023-04-26 MED ORDER — NITROGLYCERIN 0.4 MG SL SUBL: 0.4000 mg | SUBLINGUAL_TABLET | SUBLINGUAL | Status: DC | PRN

## 2023-04-26 MED ORDER — OFF THE BEAT BOOK
Freq: Once | Status: AC
  Filled 2023-04-26: qty 1

## 2023-04-26 MED ORDER — PRAVASTATIN SODIUM 40 MG PO TABS
40.0000 mg | ORAL_TABLET | Freq: Every day | ORAL | Status: DC
  Administered 2023-04-26 – 2023-04-30 (×5): 40 mg via ORAL
  Filled 2023-04-26 (×5): qty 1

## 2023-04-26 MED ORDER — FUROSEMIDE 10 MG/ML IJ SOLN
40.0000 mg | Freq: Every day | INTRAMUSCULAR | Status: AC
  Administered 2023-04-26: 40 mg via INTRAVENOUS
  Filled 2023-04-26: qty 4

## 2023-04-26 MED ORDER — AMIODARONE HCL IN DEXTROSE 360-4.14 MG/200ML-% IV SOLN
60.0000 mg/h | INTRAVENOUS | Status: DC
  Administered 2023-04-26 (×2): 60 mg/h via INTRAVENOUS
  Filled 2023-04-26 (×2): qty 200

## 2023-04-26 MED ORDER — SPIRONOLACTONE 12.5 MG HALF TABLET
12.5000 mg | ORAL_TABLET | Freq: Every day | ORAL | Status: DC
Start: 2023-04-26 — End: 2023-04-30
  Administered 2023-04-26 – 2023-04-30 (×5): 12.5 mg via ORAL
  Filled 2023-04-26 (×5): qty 1

## 2023-04-26 MED ORDER — PERFLUTREN LIPID MICROSPHERE
1.0000 mL | INTRAVENOUS | Status: AC | PRN
  Administered 2023-04-26: 2 mL via INTRAVENOUS

## 2023-04-26 MED ORDER — METOPROLOL TARTRATE 25 MG PO TABS: 50.0000 mg | ORAL_TABLET | Freq: Two times a day (BID) | ORAL | Status: DC

## 2023-04-26 MED ORDER — AMIODARONE HCL IN DEXTROSE 360-4.14 MG/200ML-% IV SOLN
30.0000 mg/h | INTRAVENOUS | Status: DC
  Administered 2023-04-27 – 2023-04-29 (×5): 30 mg/h via INTRAVENOUS
  Filled 2023-04-26 (×5): qty 200

## 2023-04-26 MED ORDER — ALBUTEROL SULFATE HFA 108 (90 BASE) MCG/ACT IN AERS: 2.0000 | INHALATION_SPRAY | Freq: Four times a day (QID) | RESPIRATORY_TRACT | Status: DC | PRN

## 2023-04-26 MED ORDER — ONDANSETRON HCL 4 MG/2ML IJ SOLN: 4.0000 mg | Freq: Four times a day (QID) | INTRAMUSCULAR | Status: DC | PRN

## 2023-04-26 MED ORDER — ACETAMINOPHEN 325 MG PO TABS
650.0000 mg | ORAL_TABLET | ORAL | Status: DC | PRN
  Filled 2023-04-26: qty 2

## 2023-04-26 MED ORDER — MOMETASONE FURO-FORMOTEROL FUM 100-5 MCG/ACT IN AERO
2.0000 | INHALATION_SPRAY | Freq: Two times a day (BID) | RESPIRATORY_TRACT | Status: DC
  Administered 2023-04-26 – 2023-04-30 (×8): 2 via RESPIRATORY_TRACT
  Filled 2023-04-26: qty 8.8

## 2023-04-26 NOTE — Progress Notes (Signed)
   As suspected LVEF is worse, now 20-25% with moderate to severe biatrial enlargement.  Will stop metoprolol , start IV amiodarone  for rate control.  Will ask heart failure service to evaluate for the need for inotrope therapy and optimization prior to attempted DCCV early next week.  Vinie KYM Maxcy, MD, [facility], FACP  [place]  [facility]  Medical Director of the [facility] Lipid Disorders &  Cardiovascular Risk Reduction Clinic Diplomate of the American Board of Clinical Lipidology Attending Cardiologist  Direct Dial: [phone]  Fax: [phone]  Website:  [email or web address]

## 2023-04-26 NOTE — Progress Notes (Signed)
 DAILY PROGRESS NOTE   Patient Name: [name] Date of Encounter: 04/26/2023 Cardiologist: Vinie JAYSON Maxcy, MD  Chief Complaint   Short of breath  Patient Profile   [name] is a 83 y.o. 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 Albertina.   Subjective   [name] is my clinic patient - seen last night by Dr. Raford for progressive dyspnea, elevated BNP of 1246. Is noted to be in afib with RVR - started diuresis with plan for TEE/DCCV today, however, he ate breakfast this am and the procedure was cancelled. No output was recorded overnight. BP remains soft- echo is pending today.  Objective   Vitals:   04/26/23 0517 04/26/23 0555 04/26/23 0700 04/26/23 0840  BP:  (!) 86/64 102/79 102/80  Pulse:  (!) 102 (!) 106 99  Resp:   (!) 21 (!) 26  Temp: 98.1 F (36.7 C)   97.7 F (36.5 C)  TempSrc: Oral   Oral  SpO2:  98% 94% 100%  Weight:      Height:       No intake or output data in the 24 hours ending 04/26/23 0948 Filed Weights   04/25/23 1132 04/25/23 2036  Weight: 61.2 kg 61.2 kg    Physical Exam   General appearance: alert and no distress Neck: JVD - 5 cm above sternal notch, no carotid bruit, supple, symmetrical, trachea midline, and thyroid  not enlarged, symmetric, no tenderness/mass/nodules Lungs: diminished breath sounds bibasilar Heart: irregularly irregular rhythm Extremities: extremities normal, atraumatic, no cyanosis or edema Neurologic: Grossly normal  Inpatient Medications    Scheduled Meds:  apixaban   5 mg Oral BID   furosemide   40 mg Intravenous Daily   insulin  aspart  0-9 Units Subcutaneous TID WC    metoprolol  tartrate  50 mg Oral BID   mometasone -formoterol   2 puff Inhalation BID   pravastatin   40 mg Oral Daily    Continuous Infusions:   PRN Meds: acetaminophen , albuterol , nitroGLYCERIN , ondansetron  (ZOFRAN ) IV   Labs   Results for orders placed or performed during the hospital encounter of 04/25/23 (from the past 48 hours)  Basic metabolic panel     Status: Abnormal   Collection Time: 04/25/23 11:51 AM  Result Value Ref Range   Sodium 137 135 - 145 mmol/L   Potassium 4.0 3.5 - 5.1 mmol/L   Chloride 101 98 - 111 mmol/L   CO2 25 22 - 32 mmol/L   Glucose, Bld 271 (H) 70 - 99 mg/dL    Comment: Glucose reference range applies only to samples taken after fasting for at least 8 hours.   BUN 36 (H) 8 - 23 mg/dL   Creatinine, Ser 8.76 0.61 - 1.24 mg/dL   Calcium  8.7 (L) 8.9 - 10.3 mg/dL   GFR, Estimated 58 (L) >60 mL/min    Comment: (NOTE) Calculated using the CKD-EPI Creatinine Equation (2021)    Anion gap 11 5 - 15    Comment: Performed at [facility], [address], [place], [place] [address]  CBC     Status: None   Collection Time: 04/25/23 11:51 AM  Result Value Ref Range   WBC 9.3 4.0 - 10.5 K/uL  RBC 4.44 4.22 - 5.81 MIL/uL   Hemoglobin 13.0 13.0 - 17.0 g/dL   HCT 59.3 60.9 - 47.9 %   MCV 91.4 80.0 - 100.0 fL   MCH 29.3 26.0 - 34.0 pg   MCHC 32.0 30.0 - 36.0 g/dL   RDW 86.9 88.4 - 84.4 %   Platelets 238 150 - 400 K/uL   nRBC 0.0 0.0 - 0.2 %    Comment: Performed at [facility], [address] [place], [place] [address]  Brain natriuretic peptide     Status: Abnormal   Collection Time: 04/25/23 11:51 AM  Result Value Ref Range   B Natriuretic Peptide 1,246.8 (H) 0.0 - 100.0 pg/mL    Comment: Performed at [facility], [address], [place], [place] [address]  Magnesium     Status: Abnormal   Collection Time: 04/25/23  9:45 PM  Result Value Ref Range   Magnesium 1.5 (L) 1.7 - 2.4 mg/dL    Comment: Performed at [facility],  [address] [place], [place] [address]  TSH     Status: None   Collection Time: 04/26/23  3:47 AM  Result Value Ref Range   TSH 2.475 0.350 - 4.500 uIU/mL    Comment: Performed by a 3rd Generation assay with a functional sensitivity of <=0.01 uIU/mL. Performed at [facility], [address] [place], [place] [address]   Lipid panel     Status: Abnormal   Collection Time: 04/26/23  3:47 AM  Result Value Ref Range   Cholesterol 96 0 - 200 mg/dL   Triglycerides 861 <849 mg/dL   HDL 25 (L) >59 mg/dL   Total CHOL/HDL Ratio 3.8 RATIO   VLDL 28 0 - 40 mg/dL   LDL Cholesterol 43 0 - 99 mg/dL    Comment:        Total Cholesterol/HDL:CHD Risk Coronary Heart Disease Risk Table                     Men   Women  1/2 Average Risk   3.4   3.3  Average Risk       5.0   4.4  2 X Average Risk   9.6   7.1  3 X Average Risk  23.4   11.0        Use the calculated Patient Ratio above and the CHD Risk Table to determine the patient's CHD Risk.        ATP III CLASSIFICATION (LDL):  <100     mg/dL   Optimal  899-870  mg/dL   Near or Above                    Optimal  130-159  mg/dL   Borderline  839-810  mg/dL   High  >809     mg/dL   Very High Performed at [facility], [address], [place], [place] [address]   CBG monitoring, ED     Status: Abnormal   Collection Time: 04/26/23  8:18 AM  Result Value Ref Range   Glucose-Capillary 116 (H) 70 - 99 mg/dL    Comment: Glucose reference range applies only to samples taken after fasting for at least 8 hours.    ECG   N/A  Telemetry   Afib with rates between 100-110 - Personally Reviewed  Radiology    DG Chest 1 View Result Date: 04/25/2023 CLINICAL DATA:  Shortness of breath.  EXAM: CHEST  1 VIEW COMPARISON:  March 10, 2023. FINDINGS: Stable cardiomediastinal silhouette. Status post coronary artery bypass graft. Mild central pulmonary vascular congestion is noted. Mild bibasilar atelectasis or edema is noted with small pleural  effusions. Bony thorax is unremarkable. IMPRESSION: Mild central pulmonary vascular congestion with mild bibasilar subsegmental atelectasis or edema with small pleural effusions. Electronically Signed   By: Lynwood Landy Raddle M.D.   On: 04/25/2023 12:30    Cardiac Studies   Echo pending  Assessment   Principal Problem:   Atrial fibrillation with RVR (HCC) Active Problems:   SOB (shortness of breath)   Acute on chronic heart failure with preserved ejection fraction [facility])   Plan   [name] is feeling better today - in's and out's were not recorded, but he diuresed. Breathing is better. Still volume up and will need more diuresis. Monitor renal function, daily weights and ins/outs. Echo pendin today - metoprolol  was held this am for low BP - if BP remains low and LVEF is diminished, may start amiodarone  for rate control. TEE/DCCV was rescheduled for Monday at 830 am.  Time Spent Directly with Patient:  I have spent a total of 35 minutes with the patient reviewing hospital notes, telemetry, EKGs, labs and examining the patient as well as establishing an assessment and plan that was discussed personally with the patient.  > 50% of time was spent in direct patient care.  Length of Stay:  LOS: 1 day   Vinie KYM Maxcy, MD, [facility], FACP  [place]  [facility]  Medical Director of the Advanced Lipid Disorders &  Cardiovascular Risk Reduction Clinic Diplomate of the American Board of Clinical Lipidology Attending Cardiologist  Direct Dial: [phone]  Fax: [phone]  Website:  [email or web address] Vinie JAYSON Maxcy 04/26/2023, 9:48 AM

## 2023-04-26 NOTE — Progress Notes (Signed)
   04/26/23 1401  Assess: MEWS Score  Temp 97.7 F (36.5 C)  BP 98/73  MAP (mmHg) 83  Pulse Rate (!) 106  ECG Heart Rate (!) 106  Resp 18  Level of Consciousness Alert  SpO2 95 %  O2 Device Room Air  Assess: MEWS Score  MEWS Temp 0  MEWS Systolic 1  MEWS Pulse 1  MEWS RR 0  MEWS LOC 0  MEWS Score 2  MEWS Score Color Yellow  Assess: if the MEWS score is Yellow or Red  Were vital signs accurate and taken at a resting state? Yes  Does the patient meet 2 or more of the SIRS criteria? No  MEWS guidelines implemented  Yes, yellow  Treat  MEWS Interventions Considered administering scheduled or prn medications/treatments as ordered  Take Vital Signs  Increase Vital Sign Frequency  Yellow: Q2hr x1, continue Q4hrs until patient remains green for 12hrs  Escalate  MEWS: Escalate Yellow: Discuss with charge nurse and consider notifying provider and/or RRT  Notify: Charge Nurse/RN  Name of Charge Nurse/RN Notified Corean Sous  Assess: SIRS CRITERIA  SIRS Temperature  0  SIRS Respirations  0  SIRS Pulse 1  SIRS WBC 0  SIRS Score Sum  1

## 2023-04-26 NOTE — ED Notes (Signed)
 CCMD notified to place pt. On cardiac monitor.

## 2023-04-26 NOTE — Interval H&P Note (Signed)
 History and Physical Interval Note:  04/26/2023 8:21 AM  [name]  has presented today for surgery, with the diagnosis of afib.  The various methods of treatment have been discussed with the patient and family. After consideration of risks, benefits and other options for treatment, the patient has consented to  Procedure(s): TRANSESOPHAGEAL ECHOCARDIOGRAM (N/A) CARDIOVERSION (N/A) as a surgical intervention.  The patient's history has been reviewed, patient examined, no change in status, stable for surgery.  I have reviewed the patient's chart and labs.  Questions were answered to the patient's satisfaction.     Mariane Burpee

## 2023-04-26 NOTE — ED Notes (Signed)
 Lab notified to add on lipid panel

## 2023-04-26 NOTE — Progress Notes (Signed)
 Heart Failure Navigator Progress Note  Assessed for Heart & Vascular TOC clinic readiness.  Patient does not meet criteria due to Advanced Heart Failure Team consulted..   Navigator will sign off at this time.    Stephane Haddock, BSN, Scientist, clinical (histocompatibility and immunogenetics) Only

## 2023-04-26 NOTE — Consult Note (Signed)
   ADVANCED HEART FAILURE CLINIC NOTE  Referring Physician: No ref. provider found  Primary Care: Jonathan Nottingham, MD Primary Cardiologist:  CC: Systolic heart failure  HPI: [name] is a 83 y.o. male with CAD status post CABG in 2008, paroxysmal atrial fibrillation (episode after CABG), history of significant burden of PVCs, PACs secondary to likely to ischemic scar currently admitted with newly diagnosed systolic heart failure.  [name] was admitted to [facility] in December 2024 with RSV/pneumonia and hypoxic respiratory failure.  In late January 2025 he was noted to be back in atrial fibrillation and started on apixaban /metoprolol .  He was also started on Lasix  at that time due to worsening shortness of breath.    He presented to the emergency department today for further evaluation of shortness of breath and weakness.  In the emergency department is noted to have an EKG with atrial fibrillation.  BNP was elevated at 1200.  Serum creatinine mildly elevated at 1.2.  He was given IV Lasix  40 mg.  Echocardiogram today with LVEF of 20 to 25% with mildly reduced RV function.  Heart failure consulted for further evaluation.  On my exam, patient mildly hypervolemic but resting comfortably.  I had a very lengthy discussion with him regarding his diagnosis of heart failure.   PHYSICAL EXAM: Vitals:   04/26/23 1953 04/26/23 1959  BP: 116/83   Pulse: 95   Resp: 18 20  Temp: 98.2 F (36.8 C) 98.6 F (37 C)  SpO2: 95%    GENERAL: NAD Lungs-mild crackles at bases CARDIAC:  JVP: 10 cm          Normal rate with regular rhythm.  Systolic murmur.  Pulses 2+.  No edema.  ABDOMEN: Soft, non-tender, non-distended.  EXTREMITIES: Warm and well perfused.  NEUROLOGIC: No obvious FND   DATA REVIEW  ECG: 04/25/22: Atrial fibrillation as per my personal interpretation  ECHO: 04/25/22: LVEF 20 to 25% with reduced RV function as per my personal interpretation  ASSESSMENT & PLAN:  Acute  systolic heart failure -Echocardiogram with LVEF of 20 to 25% and moderately reduced RV function. -On my exam patient mild to moderately volume overloaded with JVP around 10 cm.  Though he reports significant improvement in dyspnea after starting IV Lasix . -I suspect his acute systolic heart failure is secondary to atrial fibrillation.  However, we cannot rule out that he has had failure of one of his bypass grafts leading to concurrent ischemic etiology. -At this time would continue IV Lasix  40 mg twice daily.  If urine output does not improve can increase to the 60 mg twice daily. -Continue amiodarone  at 30 mg/h until TEE/cardioversion. -Start digoxin  125 mcg daily -If LVEF does not improve with rate and rhythm control can pursue ischemic evaluation outpatient. -Start beta-blocker once more euvolemic.  I suspect he should be fairly euvolemic by tomorrow or Sunday. -Starting spironolactone  12.5 mg daily. -Start SGLT2 inhibitor tomorrow -Start losartan 12.5 mg daily when blood pressure permits. -Heart failure will follow from afar.  2.  Atrial fibrillation -TEE/cardioversion on Monday -Continue apixaban  5 mg twice daily -Continue amiodarone  drip  [name] Advanced Heart Failure Mechanical Circulatory Support

## 2023-04-27 ENCOUNTER — Other Ambulatory Visit: Payer: Self-pay

## 2023-04-27 DIAGNOSIS — I4891 Unspecified atrial fibrillation: Secondary | ICD-10-CM | POA: Diagnosis not present

## 2023-04-27 LAB — BASIC METABOLIC PANEL
Anion gap: 15 (ref 5–15)
BUN: 26 mg/dL — ABNORMAL HIGH (ref 8–23)
CO2: 26 mmol/L (ref 22–32)
Calcium: 8.6 mg/dL — ABNORMAL LOW (ref 8.9–10.3)
Chloride: 101 mmol/L (ref 98–111)
Creatinine, Ser: 1.17 mg/dL (ref 0.61–1.24)
GFR, Estimated: >60 mL/min (ref >60)
Glucose, Bld: 136 mg/dL — ABNORMAL HIGH (ref 70–99)
Potassium: 2.9 mmol/L — ABNORMAL LOW (ref 3.5–5.1)
Sodium: 142 mmol/L (ref 135–145)

## 2023-04-27 LAB — GLUCOSE, CAPILLARY
Glucose-Capillary: 147 mg/dL — ABNORMAL HIGH (ref 70–99)
Glucose-Capillary: 170 mg/dL — ABNORMAL HIGH (ref 70–99)
Glucose-Capillary: 129 mg/dL — ABNORMAL HIGH (ref 70–99)
Glucose-Capillary: 141 mg/dL — ABNORMAL HIGH (ref 70–99)

## 2023-04-27 MED ORDER — POTASSIUM CHLORIDE CRYS ER 20 MEQ PO TBCR
40.0000 meq | EXTENDED_RELEASE_TABLET | ORAL | Status: AC
  Administered 2023-04-27 (×3): 40 meq via ORAL
  Filled 2023-04-27 (×3): qty 2

## 2023-04-27 MED ORDER — DAPAGLIFLOZIN PROPANEDIOL 10 MG PO TABS
10.0000 mg | ORAL_TABLET | Freq: Every day | ORAL | Status: DC
  Administered 2023-04-27 – 2023-04-30 (×4): 10 mg via ORAL
  Filled 2023-04-27 (×4): qty 1

## 2023-04-27 NOTE — Progress Notes (Signed)
 Progress Note  Patient Name: [name] Date of Encounter: 04/27/2023  Primary Cardiologist: Vinie JAYSON Maxcy, MD  Interval Summary   Chart reviewed.  Patient reports no sense of palpitations at rest.  Spoke with his wife at bedside as well.  Vital Signs    Vitals:   04/26/23 2117 04/26/23 2356 04/27/23 0822 04/27/23 1100  BP: 117/81 94/70 103/73 102/69  Pulse: 60 (!) 59 (!) 124   Resp: 18 20 18 20   Temp:  98.4 F (36.9 C) 97.6 F (36.4 C) 97.9 F (36.6 C)  TempSrc:  Oral Oral Oral  SpO2: 93% 94% 92%   Weight:      Height:        Intake/Output Summary (Last 24 hours) at 04/27/2023 1257 Last data filed at 04/27/2023 0826 Gross per 24 hour  Intake 1294.57 ml  Output 1700 ml  Net -405.43 ml   Filed Weights   04/25/23 1132 04/25/23 2036 04/26/23 1401  Weight: 61.2 kg 61.2 kg 62.1 kg    Physical Exam   GEN: No acute distress.   Neck: No JVD. Cardiac: Irregularly irregular without gallop.  Respiratory: Nonlabored. Clear to auscultation bilaterally.  ECG/Telemetry    Telemetry reviewed showing atrial fibrillation, heart rate 100-120.  Labs    Chemistry Recent Labs  Lab 04/25/23 1151 04/26/23 1235 04/27/23 0313  NA 137 141 142  K 4.0 3.8 2.9*  CL 101 105 101  CO2 25 27 26   GLUCOSE 271* 157* 136*  BUN 36* 29* 26*  CREATININE 1.23 1.15 1.17  CALCIUM  8.7* 8.5* 8.6*  GFRNONAA 58* >60 >60  ANIONGAP 11 9 15     Hematology Recent Labs  Lab 04/25/23 1151  WBC 9.3  RBC 4.44  HGB 13.0  HCT 40.6  MCV 91.4  MCH 29.3  MCHC 32.0  RDW 13.0  PLT 238   Lipid Panel     Component Value Date/Time   CHOL 96 04/26/2023 0347   TRIG 138 04/26/2023 0347   HDL 25 (L) 04/26/2023 0347   CHOLHDL 3.8 04/26/2023 0347   VLDL 28 04/26/2023 0347   LDLCALC 43 04/26/2023 0347    Cardiac Studies   Echocardiogram 04/26/2023:  1. Left ventricular ejection fraction, by estimation, is 20 to 25%. The  left ventricle has severely decreased function. The left ventricle  has no  regional wall motion abnormalities. Left ventricular diastolic function  could not be evaluated.   2. Right ventricular systolic function is mildly reduced. The right  ventricular size is moderately enlarged. There is mildly elevated  pulmonary artery systolic pressure.   3. Left atrial size was moderately dilated.   4. Right atrial size was severely dilated.   5. Restricted leaflet motion, anteriorly directed eccentric jet, at least  moderate MR. . The mitral valve is normal in structure. Moderate mitral  valve regurgitation. No evidence of mitral stenosis.   6. Tricuspid valve regurgitation is moderate.   7. The aortic valve is normal in structure. There is mild calcification  of the aortic valve. There is mild thickening of the aortic valve. Aortic  valve regurgitation is not visualized. No aortic stenosis is present.   8. Pulmonic valve regurgitation is moderate.   9. The inferior vena cava is dilated in size with <50% respiratory  variability, suggesting right atrial pressure of 15 mmHg.   Assessment & Plan   1.  Acute HFrEF, LVEF 20 to 25% and moderate RV dysfunction.  Etiology remains uncertain with possibilities including tachycardia induced in  the setting of atrial fibrillation although does have underlying ischemic heart disease as well.  Seen by heart failure team yesterday with plan for adjustments in GDMT and proceeding with TEE guided cardioversion on Monday for stabilization of rhythm.  2.  Multivessel CAD status post CABG in 2008 including LIMA to LAD, SVG to PDA, and SVG to OM1 and OM 2.  3.  Paroxysmal to persistent atrial fibrillation, CHA2DS2-VASc score of 5.  4.  Moderate mitral regurgitation and tricuspid regurgitation.  Current medications include Eliquis , Pravachol , Aldactone , and IV amiodarone  pending TEE guided cardioversion on Monday.  Plan to start Farxiga  10 mg daily.  Blood pressure low normal range, would hold off on ARB/ARNI.  May be able to add  low-dose beta-blocker next but would probably wait until he is in sinus rhythm to better assess baseline heart rate.  For questions or updates, please contact [facility] Please consult [email or web address] for contact info under   Signed, [name], MD  04/27/2023, 12:57 PM

## 2023-04-27 NOTE — Plan of Care (Signed)
  Problem: Cardiac: Goal: Ability to achieve and maintain adequate cardiovascular perfusion will improve Outcome: Progressing   Problem: Clinical Measurements: Goal: Respiratory complications will improve Outcome: Progressing Goal: Cardiovascular complication will be avoided Outcome: Progressing   Problem: Nutrition: Goal: Adequate nutrition will be maintained Outcome: Progressing   Problem: Coping: Goal: Level of anxiety will decrease Outcome: Progressing   Problem: Safety: Goal: Ability to remain free from injury will improve Outcome: Progressing

## 2023-04-28 DIAGNOSIS — I4891 Unspecified atrial fibrillation: Secondary | ICD-10-CM | POA: Diagnosis not present

## 2023-04-28 LAB — BASIC METABOLIC PANEL
Anion gap: 14 (ref 5–15)
BUN: 22 mg/dL (ref 8–23)
CO2: 22 mmol/L (ref 22–32)
Calcium: 9 mg/dL (ref 8.9–10.3)
Chloride: 104 mmol/L (ref 98–111)
Creatinine, Ser: 1.29 mg/dL — ABNORMAL HIGH (ref 0.61–1.24)
GFR, Estimated: 55 mL/min — ABNORMAL LOW (ref >60)
Glucose, Bld: 151 mg/dL — ABNORMAL HIGH (ref 70–99)
Potassium: 4.7 mmol/L (ref 3.5–5.1)
Sodium: 140 mmol/L (ref 135–145)

## 2023-04-28 LAB — GLUCOSE, CAPILLARY
Glucose-Capillary: 158 mg/dL — ABNORMAL HIGH (ref 70–99)
Glucose-Capillary: 143 mg/dL — ABNORMAL HIGH (ref 70–99)
Glucose-Capillary: 135 mg/dL — ABNORMAL HIGH (ref 70–99)
Glucose-Capillary: 183 mg/dL — ABNORMAL HIGH (ref 70–99)

## 2023-04-28 NOTE — Plan of Care (Signed)
  Problem: Coping: Goal: Ability to adjust to condition or change in health will improve Outcome: Progressing   Problem: Fluid Volume: Goal: Ability to maintain a balanced intake and output will improve Outcome: Progressing   Problem: Cardiac: Goal: Ability to achieve and maintain adequate cardiovascular perfusion will improve Outcome: Progressing   Problem: Clinical Measurements: Goal: Respiratory complications will improve Outcome: Progressing Goal: Cardiovascular complication will be avoided Outcome: Progressing   Problem: Coping: Goal: Level of anxiety will decrease Outcome: Progressing   Problem: Safety: Goal: Ability to remain free from injury will improve Outcome: Progressing   Problem: Cardiac: Goal: Ability to achieve and maintain adequate cardiopulmonary perfusion will improve Outcome: Progressing

## 2023-04-28 NOTE — H&P (View-Only) (Signed)
 Progress Note  Patient Name: [name] Date of Encounter: 04/28/2023  Primary Cardiologist: Vinie JAYSON Maxcy, MD  Interval Summary   Interval chart reviewed.  Patient napping this afternoon, did not sleep well last night.  I spoke with his wife at the bedside.  He remains in atrial fibrillation with heart rate 100-120 range.  Vital Signs    Vitals:   04/28/23 0559 04/28/23 0601 04/28/23 0602 04/28/23 0806  BP: 99/64   118/87  Pulse: (!) 101 100 (!) 102 (!) 122  Resp: 18   18  Temp: 98.6 F (37 C)   97.6 F (36.4 C)  TempSrc: Oral   Oral  SpO2: 96%  97% 94%  Weight:      Height:        Intake/Output Summary (Last 24 hours) at 04/28/2023 1254 Last data filed at 04/28/2023 0903 Gross per 24 hour  Intake 863.01 ml  Output 1145 ml  Net -281.99 ml   Filed Weights   04/25/23 1132 04/25/23 2036 04/26/23 1401  Weight: 61.2 kg 61.2 kg 62.1 kg    Physical Exam   GEN: No acute distress.   Neck: No JVD. Cardiac: Irregularly irregular without gallop.  Respiratory: Nonlabored. Clear to auscultation bilaterally.  ECG/Telemetry    Telemetry reviewed showing atrial fibrillation, heart rate 100-120.  Labs    Chemistry Recent Labs  Lab 04/26/23 1235 04/27/23 0313 04/28/23 0344  NA 141 142 140  K 3.8 2.9* 4.7  CL 105 101 104  CO2 27 26 22   GLUCOSE 157* 136* 151*  BUN 29* 26* 22  CREATININE 1.15 1.17 1.29*  CALCIUM  8.5* 8.6* 9.0  GFRNONAA >60 >60 55*  ANIONGAP 9 15 14     Hematology Recent Labs  Lab 04/25/23 1151  WBC 9.3  RBC 4.44  HGB 13.0  HCT 40.6  MCV 91.4  MCH 29.3  MCHC 32.0  RDW 13.0  PLT 238   Lipid Panel     Component Value Date/Time   CHOL 96 04/26/2023 0347   TRIG 138 04/26/2023 0347   HDL 25 (L) 04/26/2023 0347   CHOLHDL 3.8 04/26/2023 0347   VLDL 28 04/26/2023 0347   LDLCALC 43 04/26/2023 0347    Cardiac Studies   Echocardiogram 04/26/2023:  1. Left ventricular ejection fraction, by estimation, is 20 to 25%. The  left ventricle  has severely decreased function. The left ventricle has no  regional wall motion abnormalities. Left ventricular diastolic function  could not be evaluated.   2. Right ventricular systolic function is mildly reduced. The right  ventricular size is moderately enlarged. There is mildly elevated  pulmonary artery systolic pressure.   3. Left atrial size was moderately dilated.   4. Right atrial size was severely dilated.   5. Restricted leaflet motion, anteriorly directed eccentric jet, at least  moderate MR. . The mitral valve is normal in structure. Moderate mitral  valve regurgitation. No evidence of mitral stenosis.   6. Tricuspid valve regurgitation is moderate.   7. The aortic valve is normal in structure. There is mild calcification  of the aortic valve. There is mild thickening of the aortic valve. Aortic  valve regurgitation is not visualized. No aortic stenosis is present.   8. Pulmonic valve regurgitation is moderate.   9. The inferior vena cava is dilated in size with <50% respiratory  variability, suggesting right atrial pressure of 15 mmHg.   Assessment & Plan   1.  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  Etiology remains uncertain with possibilities including tachycardia induced in the setting of atrial fibrillation although does have underlying ischemic heart disease as well.  Seen by heart failure team with plan for adjustments in GDMT and proceeding with TEE guided cardioversion on Monday for stabilization of rhythm.  2.  Multivessel CAD status post CABG in 2008 including LIMA to LAD, SVG to PDA, and SVG to OM1 and OM 2.  3.  Paroxysmal to persistent atrial fibrillation, CHA2DS2-VASc score of 5.  4.  Moderate mitral regurgitation and tricuspid regurgitation.  Continue Eliquis , Pravachol , Farxiga , Aldactone , and IV amiodarone  pending TEE guided cardioversion on Monday.  Blood pressure low normal range, would hold off on ARB/ARNI.  May be able to add  low-dose beta-blocker next but would probably wait until he is in sinus rhythm to better assess baseline heart rate.  For questions or updates, please contact [facility] Please consult [email or web address] for contact info under   Signed, [name], MD  04/28/2023, 12:54 PM

## 2023-04-28 NOTE — Progress Notes (Signed)
 Progress Note  Patient Name: [name] Date of Encounter: 04/28/2023  Primary Cardiologist: Vinie JAYSON Maxcy, MD  Interval Summary   Interval chart reviewed.  Patient napping this afternoon, did not sleep well last night.  I spoke with his wife at the bedside.  He remains in atrial fibrillation with heart rate 100-120 range.  Vital Signs    Vitals:   04/28/23 0559 04/28/23 0601 04/28/23 0602 04/28/23 0806  BP: 99/64   118/87  Pulse: (!) 101 100 (!) 102 (!) 122  Resp: 18   18  Temp: 98.6 F (37 C)   97.6 F (36.4 C)  TempSrc: Oral   Oral  SpO2: 96%  97% 94%  Weight:      Height:        Intake/Output Summary (Last 24 hours) at 04/28/2023 1254 Last data filed at 04/28/2023 0903 Gross per 24 hour  Intake 863.01 ml  Output 1145 ml  Net -281.99 ml   Filed Weights   04/25/23 1132 04/25/23 2036 04/26/23 1401  Weight: 61.2 kg 61.2 kg 62.1 kg    Physical Exam   GEN: No acute distress.   Neck: No JVD. Cardiac: Irregularly irregular without gallop.  Respiratory: Nonlabored. Clear to auscultation bilaterally.  ECG/Telemetry    Telemetry reviewed showing atrial fibrillation, heart rate 100-120.  Labs    Chemistry Recent Labs  Lab 04/26/23 1235 04/27/23 0313 04/28/23 0344  NA 141 142 140  K 3.8 2.9* 4.7  CL 105 101 104  CO2 27 26 22   GLUCOSE 157* 136* 151*  BUN 29* 26* 22  CREATININE 1.15 1.17 1.29*  CALCIUM  8.5* 8.6* 9.0  GFRNONAA >60 >60 55*  ANIONGAP 9 15 14     Hematology Recent Labs  Lab 04/25/23 1151  WBC 9.3  RBC 4.44  HGB 13.0  HCT 40.6  MCV 91.4  MCH 29.3  MCHC 32.0  RDW 13.0  PLT 238   Lipid Panel     Component Value Date/Time   CHOL 96 04/26/2023 0347   TRIG 138 04/26/2023 0347   HDL 25 (L) 04/26/2023 0347   CHOLHDL 3.8 04/26/2023 0347   VLDL 28 04/26/2023 0347   LDLCALC 43 04/26/2023 0347    Cardiac Studies   Echocardiogram 04/26/2023:  1. Left ventricular ejection fraction, by estimation, is 20 to 25%. The  left ventricle  has severely decreased function. The left ventricle has no  regional wall motion abnormalities. Left ventricular diastolic function  could not be evaluated.   2. Right ventricular systolic function is mildly reduced. The right  ventricular size is moderately enlarged. There is mildly elevated  pulmonary artery systolic pressure.   3. Left atrial size was moderately dilated.   4. Right atrial size was severely dilated.   5. Restricted leaflet motion, anteriorly directed eccentric jet, at least  moderate MR. . The mitral valve is normal in structure. Moderate mitral  valve regurgitation. No evidence of mitral stenosis.   6. Tricuspid valve regurgitation is moderate.   7. The aortic valve is normal in structure. There is mild calcification  of the aortic valve. There is mild thickening of the aortic valve. Aortic  valve regurgitation is not visualized. No aortic stenosis is present.   8. Pulmonic valve regurgitation is moderate.   9. The inferior vena cava is dilated in size with <50% respiratory  variability, suggesting right atrial pressure of 15 mmHg.   Assessment & Plan   1.  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  Etiology remains uncertain with possibilities including tachycardia induced in the setting of atrial fibrillation although does have underlying ischemic heart disease as well.  Seen by heart failure team with plan for adjustments in GDMT and proceeding with TEE guided cardioversion on Monday for stabilization of rhythm.  2.  Multivessel CAD status post CABG in 2008 including LIMA to LAD, SVG to PDA, and SVG to OM1 and OM 2.  3.  Paroxysmal to persistent atrial fibrillation, CHA2DS2-VASc score of 5.  4.  Moderate mitral regurgitation and tricuspid regurgitation.  Continue Eliquis , Pravachol , Farxiga , Aldactone , and IV amiodarone  pending TEE guided cardioversion on Monday.  Blood pressure low normal range, would hold off on ARB/ARNI.  May be able to add  low-dose beta-blocker next but would probably wait until he is in sinus rhythm to better assess baseline heart rate.  For questions or updates, please contact [facility] Please consult [email or web address] for contact info under   Signed, [name], MD  04/28/2023, 12:54 PM

## 2023-04-28 NOTE — Plan of Care (Signed)
  Problem: Education: Goal: Ability to describe self-care measures that may prevent or decrease complications (Diabetes Survival Skills Education) will improve Outcome: Progressing Goal: Individualized Educational Video(s) Outcome: Progressing   Problem: Health Behavior/Discharge Planning: Goal: Ability to identify and utilize available resources and services will improve Outcome: Progressing Goal: Ability to manage health-related needs will improve Outcome: Progressing   Problem: Nutritional: Goal: Maintenance of adequate nutrition will improve Outcome: Progressing Goal: Progress toward achieving an optimal weight will improve Outcome: Progressing   Problem: Tissue Perfusion: Goal: Adequacy of tissue perfusion will improve Outcome: Progressing   Problem: Education: Goal: Understanding of cardiac disease, CV risk reduction, and recovery process will improve Outcome: Progressing Goal: Individualized Educational Video(s) Outcome: Progressing   Problem: Activity: Goal: Ability to tolerate increased activity will improve Outcome: Progressing   Problem: Cardiac: Goal: Ability to achieve and maintain adequate cardiovascular perfusion will improve Outcome: Progressing   Problem: Health Behavior/Discharge Planning: Goal: Ability to safely manage health-related needs after discharge will improve Outcome: Progressing

## 2023-04-28 NOTE — Plan of Care (Signed)
  Problem: Education: Goal: Ability to describe self-care measures that may prevent or decrease complications (Diabetes Survival Skills Education) will improve Outcome: Progressing Goal: Individualized Educational Video(s) Outcome: Progressing   Problem: Coping: Goal: Ability to adjust to condition or change in health will improve Outcome: Progressing   Problem: Fluid Volume: Goal: Ability to maintain a balanced intake and output will improve Outcome: Progressing   Problem: Health Behavior/Discharge Planning: Goal: Ability to identify and utilize available resources and services will improve Outcome: Progressing Goal: Ability to manage health-related needs will improve Outcome: Progressing   Problem: Metabolic: Goal: Ability to maintain appropriate glucose levels will improve Outcome: Progressing   Problem: Nutritional: Goal: Maintenance of adequate nutrition will improve Outcome: Progressing Goal: Progress toward achieving an optimal weight will improve Outcome: Progressing   Problem: Skin Integrity: Goal: Risk for impaired skin integrity will decrease Outcome: Progressing   Problem: Tissue Perfusion: Goal: Adequacy of tissue perfusion will improve Outcome: Progressing   Problem: Education: Goal: Understanding of cardiac disease, CV risk reduction, and recovery process will improve Outcome: Progressing Goal: Individualized Educational Video(s) Outcome: Progressing   Problem: Activity: Goal: Ability to tolerate increased activity will improve Outcome: Progressing   Problem: Cardiac: Goal: Ability to achieve and maintain adequate cardiovascular perfusion will improve Outcome: Progressing   Problem: Health Behavior/Discharge Planning: Goal: Ability to safely manage health-related needs after discharge will improve Outcome: Progressing   Problem: Education: Goal: Knowledge of General Education information will improve Description: Including pain rating scale,  medication(s)/side effects and non-pharmacologic comfort measures Outcome: Progressing   Problem: Health Behavior/Discharge Planning: Goal: Ability to manage health-related needs will improve Outcome: Progressing   Problem: Clinical Measurements: Goal: Ability to maintain clinical measurements within normal limits will improve Outcome: Progressing Goal: Will remain free from infection Outcome: Progressing Goal: Diagnostic test results will improve Outcome: Progressing Goal: Respiratory complications will improve Outcome: Progressing Goal: Cardiovascular complication will be avoided Outcome: Progressing   Problem: Activity: Goal: Risk for activity intolerance will decrease Outcome: Progressing   Problem: Nutrition: Goal: Adequate nutrition will be maintained Outcome: Progressing   Problem: Coping: Goal: Level of anxiety will decrease Outcome: Progressing   Problem: Elimination: Goal: Will not experience complications related to bowel motility Outcome: Progressing Goal: Will not experience complications related to urinary retention Outcome: Progressing   Problem: Pain Managment: Goal: General experience of comfort will improve and/or be controlled Outcome: Progressing   Problem: Safety: Goal: Ability to remain free from injury will improve Outcome: Progressing   Problem: Skin Integrity: Goal: Risk for impaired skin integrity will decrease Outcome: Progressing   Problem: Education: Goal: Ability to demonstrate management of disease process will improve Outcome: Progressing Goal: Ability to verbalize understanding of medication therapies will improve Outcome: Progressing Goal: Individualized Educational Video(s) Outcome: Progressing   Problem: Activity: Goal: Capacity to carry out activities will improve Outcome: Progressing   Problem: Cardiac: Goal: Ability to achieve and maintain adequate cardiopulmonary perfusion will improve Outcome: Progressing

## 2023-04-29 ENCOUNTER — Encounter (HOSPITAL_COMMUNITY)
Admission: EM | Disposition: A | Discharge status: Home / Self Care | Payer: Self-pay | Source: Home / Self Care | Attending: Internal Medicine

## 2023-04-29 ENCOUNTER — Inpatient Hospital Stay (HOSPITAL_COMMUNITY): Payer: Medicare Other

## 2023-04-29 ENCOUNTER — Encounter (HOSPITAL_COMMUNITY): Payer: Self-pay | Admitting: Cardiovascular Disease

## 2023-04-29 ENCOUNTER — Inpatient Hospital Stay (HOSPITAL_COMMUNITY): Payer: Medicare Other | Admitting: Anesthesiology

## 2023-04-29 DIAGNOSIS — I110 Hypertensive heart disease with heart failure: Secondary | ICD-10-CM

## 2023-04-29 DIAGNOSIS — I2510 Atherosclerotic heart disease of native coronary artery without angina pectoris: Secondary | ICD-10-CM

## 2023-04-29 DIAGNOSIS — I5033 Acute on chronic diastolic (congestive) heart failure: Secondary | ICD-10-CM

## 2023-04-29 DIAGNOSIS — I340 Nonrheumatic mitral (valve) insufficiency: Secondary | ICD-10-CM

## 2023-04-29 DIAGNOSIS — I4891 Unspecified atrial fibrillation: Secondary | ICD-10-CM

## 2023-04-29 DIAGNOSIS — I361 Nonrheumatic tricuspid (valve) insufficiency: Secondary | ICD-10-CM | POA: Diagnosis not present

## 2023-04-29 DIAGNOSIS — I5021 Acute systolic (congestive) heart failure: Secondary | ICD-10-CM | POA: Diagnosis not present

## 2023-04-29 HISTORY — PX: CARDIOVERSION: EP1203

## 2023-04-29 HISTORY — PX: TRANSESOPHAGEAL ECHOCARDIOGRAM (CATH LAB): EP1270

## 2023-04-29 LAB — CBC
HCT: 40.9 % (ref 39.0–52.0)
Hemoglobin: 13.3 g/dL (ref 13.0–17.0)
MCH: 29.2 pg (ref 26.0–34.0)
MCHC: 32.5 g/dL (ref 30.0–36.0)
MCV: 89.7 fL (ref 80.0–100.0)
Platelets: 224 10*3/uL (ref 150–400)
RBC: 4.56 MIL/uL (ref 4.22–5.81)
RDW: 13.2 % (ref 11.5–15.5)
WBC: 9.1 10*3/uL (ref 4.0–10.5)
nRBC: 0 % (ref 0.0–0.2)

## 2023-04-29 LAB — ECHO TEE

## 2023-04-29 LAB — GLUCOSE, CAPILLARY
Glucose-Capillary: 136 mg/dL — ABNORMAL HIGH (ref 70–99)
Glucose-Capillary: 149 mg/dL — ABNORMAL HIGH (ref 70–99)
Glucose-Capillary: 172 mg/dL — ABNORMAL HIGH (ref 70–99)
Glucose-Capillary: 137 mg/dL — ABNORMAL HIGH (ref 70–99)

## 2023-04-29 LAB — BASIC METABOLIC PANEL
Anion gap: 19 — ABNORMAL HIGH (ref 5–15)
BUN: 24 mg/dL — ABNORMAL HIGH (ref 8–23)
CO2: 20 mmol/L — ABNORMAL LOW (ref 22–32)
Calcium: 9.8 mg/dL (ref 8.9–10.3)
Chloride: 102 mmol/L (ref 98–111)
Creatinine, Ser: 1.14 mg/dL (ref 0.61–1.24)
GFR, Estimated: >60 mL/min (ref >60)
Glucose, Bld: 148 mg/dL — ABNORMAL HIGH (ref 70–99)
Potassium: 4.1 mmol/L (ref 3.5–5.1)
Sodium: 141 mmol/L (ref 135–145)

## 2023-04-29 SURGERY — TRANSESOPHAGEAL ECHOCARDIOGRAM (TEE) (CATHLAB)
Anesthesia: Monitor Anesthesia Care

## 2023-04-29 MED ORDER — ALBUTEROL SULFATE (2.5 MG/3ML) 0.083% IN NEBU
INHALATION_SOLUTION | RESPIRATORY_TRACT | Status: AC
  Filled 2023-04-29: qty 3

## 2023-04-29 MED ORDER — PROPOFOL 500 MG/50ML IV EMUL
INTRAVENOUS | Status: DC | PRN
  Administered 2023-04-29: 250 ug/kg/min via INTRAVENOUS

## 2023-04-29 MED ORDER — AMIODARONE HCL 200 MG PO TABS: 200.0000 mg | ORAL_TABLET | Freq: Every day | ORAL | Status: DC

## 2023-04-29 MED ORDER — AMIODARONE HCL 200 MG PO TABS
400.0000 mg | ORAL_TABLET | Freq: Every day | ORAL | Status: DC
  Administered 2023-04-29 – 2023-04-30 (×2): 400 mg via ORAL
  Filled 2023-04-29 (×2): qty 2

## 2023-04-29 MED ORDER — SODIUM CHLORIDE 0.9% FLUSH: 3.0000 mL | Freq: Two times a day (BID) | INTRAVENOUS | Status: DC

## 2023-04-29 MED ORDER — METOPROLOL SUCCINATE ER 25 MG PO TB24
25.0000 mg | ORAL_TABLET | Freq: Every day | ORAL | Status: DC
  Administered 2023-04-29 – 2023-04-30 (×2): 25 mg via ORAL
  Filled 2023-04-29 (×2): qty 1

## 2023-04-29 MED ORDER — PHENYLEPHRINE 80 MCG/ML (10ML) SYRINGE FOR IV PUSH (FOR BLOOD PRESSURE SUPPORT)
PREFILLED_SYRINGE | INTRAVENOUS | Status: DC | PRN
  Administered 2023-04-29: 80 ug via INTRAVENOUS
  Administered 2023-04-29: 120 ug via INTRAVENOUS
  Administered 2023-04-29: 80 ug via INTRAVENOUS

## 2023-04-29 MED ORDER — FUROSEMIDE 40 MG PO TABS
40.0000 mg | ORAL_TABLET | Freq: Every day | ORAL | Status: DC
  Administered 2023-04-29 – 2023-04-30 (×2): 40 mg via ORAL
  Filled 2023-04-29 (×2): qty 1

## 2023-04-29 MED ORDER — SODIUM CHLORIDE 0.9% FLUSH: 3.0000 mL | INTRAVENOUS | Status: DC | PRN

## 2023-04-29 MED ORDER — APIXABAN 5 MG PO TABS
ORAL_TABLET | ORAL | Status: AC
  Administered 2023-04-29: 5 mg
  Filled 2023-04-29: qty 1

## 2023-04-29 SURGICAL SUPPLY — 1 items: PAD DEFIB RADIO PHYSIO CONN (PAD) ×2 IMPLANT

## 2023-04-29 NOTE — Plan of Care (Signed)
  Problem: Coping: Goal: Ability to adjust to condition or change in health will improve Outcome: Progressing   Problem: Health Behavior/Discharge Planning: Goal: Ability to manage health-related needs will improve Outcome: Progressing   Problem: Metabolic: Goal: Ability to maintain appropriate glucose levels will improve Outcome: Progressing   Problem: Tissue Perfusion: Goal: Adequacy of tissue perfusion will improve Outcome: Progressing   Problem: Education: Goal: Understanding of cardiac disease, CV risk reduction, and recovery process will improve Outcome: Progressing   Problem: Activity: Goal: Ability to tolerate increased activity will improve Outcome: Progressing   Problem: Education: Goal: Knowledge of General Education information will improve Description: Including pain rating scale, medication(s)/side effects and non-pharmacologic comfort measures Outcome: Progressing   Problem: Clinical Measurements: Goal: Cardiovascular complication will be avoided Outcome: Progressing   Problem: Nutrition: Goal: Adequate nutrition will be maintained Outcome: Progressing   Problem: Cardiac: Goal: Ability to achieve and maintain adequate cardiopulmonary perfusion will improve Outcome: Progressing

## 2023-04-29 NOTE — Anesthesia Procedure Notes (Signed)
 Procedure Name: MAC Date/Time: 04/29/2023 8:08 AM  Performed by: Eloisa Hait, CRNAPre-anesthesia Checklist: Patient identified, Emergency Drugs available, Suction available, Patient being monitored and Timeout performed Patient Re-evaluated:Patient Re-evaluated prior to induction Preoxygenation: Pre-oxygenation with 100% oxygen Induction Type: IV induction Placement Confirmation: positive ETCO2 Dental Injury: Teeth and Oropharynx as per pre-operative assessment

## 2023-04-29 NOTE — Progress Notes (Addendum)
 Patient Name: [name] Date of Encounter: 04/29/2023 [facility] Cardiologist: Hazle Lites, MD    Interval Summary  .    Patient reports feeling well after his cardioversion. Denies chest pain, shortness of breath, ankle swelling, palpitations. He is maintaining NSR with HR in the 70s. No dizziness, syncope, near syncope.   Vital Signs .    Vitals:   04/29/23 0840 04/29/23 0850 04/29/23 0900 04/29/23 0924  BP: 107/68 94/66 101/75 111/82  Pulse: 70 73 73   Resp: 16 16 20  (!) 24  Temp: (!) 97.4 F (36.3 C)   97.6 F (36.4 C)  TempSrc: Tympanic   Oral  SpO2: 98% 97% 95% 97%  Weight:      Height:        Intake/Output Summary (Last 24 hours) at 04/29/2023 1058 Last data filed at 04/28/2023 1408 Gross per 24 hour  Intake --  Output 250 ml  Net -250 ml      04/26/2023    2:01 PM 04/25/2023    8:36 PM 04/25/2023   11:32 AM  Last 3 Weights  Weight (lbs) 136 lb 12.8 oz 135 lb 135 lb  Weight (kg) 62.052 kg 61.236 kg 61.236 kg      Telemetry/ECG    NSR, HR in the 70s - Personally Reviewed  Physical Exam .   GEN: No acute distress. Sitting upright in the bed    Neck: No JVD Cardiac:  RRR, no murmurs, rubs, or gallops. Radial pulses 2+ bilaterally  Respiratory: Clear to auscultation bilaterally. Normal WOB on room air  GI: Soft, nontender, non-distended  MS: No edema in BLE   Assessment & Plan .     Acute HFrEF  Moderate MR Moderate TR  - Patient presented to the ED on 2/6 for evaluation of shortness of breath. Found to be in atrial fibrillation with HR 114 BPM. BNP 1246. 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 He also does have  ischemic heart disease, s/p CABG in 2008 - Underwent TEE guided DCCV this AM. Now back in NSR - BP was a bit low when in afib with RVR. Now that patient is in NSR, BP is improving  - Start metoprolol  succinate 25 mg daily  - If BP tolerates, plan to start either losartan vs entresto prior to DC  - Continue farxiga  10 mg daily, spironolactone  12.5 mg daily  - Appears euvolemic on exam today. Resume home PO lasix  40 mg daily  - Anticipate repeat echocardiogram in 3 months now that patient is back in NSR. If EF remains reduced, would need ischemic evaluation as an outpatient   CAD s/p CABG - Previously underwent CABG in 2008 with LIMA to LAD, SVG to PDA, and SVG to OM1 and OM 2  - Denies chest pain  - Continue pravastatin   - Start metoprolol  succinate as above  - Not on ASA given eliquis  use   Persistent Atrial Fibrillation  - Presented in afib with RVR. Likely contributed to CHF exacerbation and reduced EF as above  - Now s/p TEE guided DCCV this AM - Maintaining NSR  - He has been on amiodarone  IV for the past 3 days. Transition to PO amiodarone  400 mg daily for  the next week, followed by 200 mg daily thereafter  - Start metoprolol  succinate 25 mg daily  - Continue eliquis  5 mg BID- this is the correct dose for him    For questions or updates, please contact [facility] Please consult [email or web address] for contact info under        Signed, Debria Fang, PA-C   Patient seen and examined with KJ PA-C.  Agree as above, with the following exceptions and changes as noted below. Pt tolerated TEE/DCCV well but remains SOB with activity. EF reduced. Gen: NAD, CV: RRR, no murmurs, Lungs: clear, Abd: soft, Extrem: Warm, well perfused, no edema, Neuro/Psych: alert and oriented x 3, normal mood and affect. All available labs, radiology testing, previous records reviewed. Agree as above. Metoprolol  will assist with occasional PVCs and HF therapy. Agree with amiodarone  for afib.    Heart Failure Therapy ACE-I/ARB/ARNI: consider adding tomorrow BB: metop succ 25 mg daily MRA: spiro 12.5 mg daily  SGLT2I: farxiga  10 mg daily Diuretic plan: lasix  40 mg po daily home dose.   Will need med titration tomorrow but consider hosp dc from CV perspective in 24-48 hours if ambulating well without significant DOE.   [name], MD 04/29/23 3:13 PM

## 2023-04-29 NOTE — Plan of Care (Signed)
  Problem: Education: Goal: Ability to describe self-care measures that may prevent or decrease complications (Diabetes Survival Skills Education) will improve Outcome: Progressing Goal: Individualized Educational Video(s) Outcome: Progressing   Problem: Coping: Goal: Ability to adjust to condition or change in health will improve Outcome: Progressing   Problem: Fluid Volume: Goal: Ability to maintain a balanced intake and output will improve Outcome: Progressing   Problem: Health Behavior/Discharge Planning: Goal: Ability to identify and utilize available resources and services will improve Outcome: Progressing Goal: Ability to manage health-related needs will improve Outcome: Progressing   Problem: Metabolic: Goal: Ability to maintain appropriate glucose levels will improve Outcome: Progressing   Problem: Nutritional: Goal: Maintenance of adequate nutrition will improve Outcome: Progressing Goal: Progress toward achieving an optimal weight will improve Outcome: Progressing   Problem: Skin Integrity: Goal: Risk for impaired skin integrity will decrease Outcome: Progressing   Problem: Tissue Perfusion: Goal: Adequacy of tissue perfusion will improve Outcome: Progressing   Problem: Education: Goal: Understanding of cardiac disease, CV risk reduction, and recovery process will improve Outcome: Progressing Goal: Individualized Educational Video(s) Outcome: Progressing   Problem: Activity: Goal: Ability to tolerate increased activity will improve Outcome: Progressing   Problem: Cardiac: Goal: Ability to achieve and maintain adequate cardiovascular perfusion will improve Outcome: Progressing   Problem: Health Behavior/Discharge Planning: Goal: Ability to safely manage health-related needs after discharge will improve Outcome: Progressing   Problem: Education: Goal: Knowledge of General Education information will improve Description: Including pain rating scale,  medication(s)/side effects and non-pharmacologic comfort measures Outcome: Progressing   Problem: Health Behavior/Discharge Planning: Goal: Ability to manage health-related needs will improve Outcome: Progressing   Problem: Clinical Measurements: Goal: Ability to maintain clinical measurements within normal limits will improve Outcome: Progressing Goal: Will remain free from infection Outcome: Progressing Goal: Diagnostic test results will improve Outcome: Progressing Goal: Respiratory complications will improve Outcome: Progressing Goal: Cardiovascular complication will be avoided Outcome: Progressing   Problem: Activity: Goal: Risk for activity intolerance will decrease Outcome: Progressing   Problem: Nutrition: Goal: Adequate nutrition will be maintained Outcome: Progressing   Problem: Coping: Goal: Level of anxiety will decrease Outcome: Progressing   Problem: Elimination: Goal: Will not experience complications related to bowel motility Outcome: Progressing Goal: Will not experience complications related to urinary retention Outcome: Progressing   Problem: Pain Managment: Goal: General experience of comfort will improve and/or be controlled Outcome: Progressing   Problem: Safety: Goal: Ability to remain free from injury will improve Outcome: Progressing   Problem: Skin Integrity: Goal: Risk for impaired skin integrity will decrease Outcome: Progressing   Problem: Education: Goal: Ability to demonstrate management of disease process will improve Outcome: Progressing Goal: Ability to verbalize understanding of medication therapies will improve Outcome: Progressing Goal: Individualized Educational Video(s) Outcome: Progressing   Problem: Activity: Goal: Capacity to carry out activities will improve Outcome: Progressing   Problem: Cardiac: Goal: Ability to achieve and maintain adequate cardiopulmonary perfusion will improve Outcome: Progressing

## 2023-04-29 NOTE — Evaluation (Signed)
 Physical Therapy Evaluation Patient Details Name: [name] MRN: [id number] DOB: 1940-11-11 Today's Date: 04/29/2023  History of Present Illness  [name] is a 83 y.o. male here 04/25/23  for shortness of breath.  Patient has a history of A-fib, had been following with his outpatient cardiologist, but unfortunately his heart rate had continued to be elevated in spite of his metoprolol .  Patient recently increased from 25-50.  Currently pending outpatient TEE for cardioversion. Patient is s/p cardioversion 04/29/23   Clinical Impression  Patient received in bed, he is pleasant and agrees to PT assessment. Patient is mod I with bed mobility. Transfers with supervision. He ambulated 200 feet without AD, however requires several standing rest breaks due to fatigue and sob. O2 sats 100% and HR in mid 80s during mobility. He will continue to benefit from skilled PT to improve activity tolerance and strength for improved safety with mobility.          If plan is discharge home, recommend the following: A little help with walking and/or transfers;A little help with bathing/dressing/bathroom;Assist for transportation;Help with stairs or ramp for entrance;Assistance with cooking/housework;Direct supervision/assist for medications management   Can travel by private vehicle    yes    Equipment Recommendations None recommended by PT  Recommendations for Other Services       Functional Status Assessment Patient has had a recent decline in their functional status and demonstrates the ability to make significant improvements in function in a reasonable and predictable amount of time.     Precautions / Restrictions Precautions Precautions: Fall Restrictions Weight Bearing Restrictions Per Provider Order: No      Mobility  Bed Mobility Overal bed mobility: Modified Independent                  Transfers Overall transfer level: Modified independent Equipment used: None                     Ambulation/Gait Ambulation/Gait assistance: Contact guard assist Gait Distance (Feet): 200 Feet Assistive device: None Gait Pattern/deviations: Step-through pattern, Drifts right/left Gait velocity: decr     General Gait Details: patient ambulated without AD, required several brief standing rest breaks due to fatigue while walking. HR in mid 80s throughout. O2 sats at 100%  Stairs            Wheelchair Mobility     Tilt Bed    Modified Rankin (Stroke Patients Only)       Balance Overall balance assessment: Needs assistance Sitting-balance support: Feet supported Sitting balance-Leahy Scale: Good     Standing balance support: No upper extremity supported, During functional activity Standing balance-Leahy Scale: Good Standing balance comment: a little wobbly with gait and limited by fatigue.                             Pertinent Vitals/Pain Pain Assessment Pain Assessment: No/denies pain    Home Living Family/patient expects to be discharged to:: Private residence Living Arrangements: Spouse/significant other Available Help at Discharge: Family Type of Home: House Home Access: Stairs to enter   Secretary/administrator of Steps: 6 Alternate Level Stairs-Number of Steps: flight up to bedroom, flight down to laundry Home Layout: Two level;Bed/bath upstairs;Laundry or work area in [employer] Equipment: Agricultural consultant (2 wheels)      Prior Function Prior Level of Function : Independent/Modified Independent;Driving  Mobility Comments: was not using AD prior to admission ADLs Comments: independent     Extremity/Trunk Assessment   Upper Extremity Assessment Upper Extremity Assessment: Generalized weakness    Lower Extremity Assessment Lower Extremity Assessment: Generalized weakness    Cervical / Trunk Assessment Cervical / Trunk Assessment: Normal  Communication   Communication Communication: Hearing  impairment Cueing Techniques: Verbal cues  Cognition Arousal: Alert Behavior During Therapy: WFL for tasks assessed/performed Overall Cognitive Status: Within Functional Limits for tasks assessed                                          General Comments      Exercises     Assessment/Plan    PT Assessment Patient needs continued PT services  PT Problem List Decreased strength;Decreased activity tolerance;Decreased balance;Decreased mobility       PT Treatment Interventions Gait training;Stair training;Functional mobility training;Therapeutic activities;Therapeutic exercise;Patient/family education    PT Goals (Current goals can be found in the Care Plan section)  Acute Rehab PT Goals Patient Stated Goal: to get stronger PT Goal Formulation: With patient Time For Goal Achievement: 05/13/23 Potential to Achieve Goals: Good    Frequency Min 1X/week     Co-evaluation               AM-PAC PT "6 Clicks" Mobility  Outcome Measure Help needed turning from your back to your side while in a flat bed without using bedrails?: None Help needed moving from lying on your back to sitting on the side of a flat bed without using bedrails?: None Help needed moving to and from a bed to a chair (including a wheelchair)?: A Little Help needed standing up from a chair using your arms (e.g., wheelchair or bedside chair)?: A Little Help needed to walk in hospital room?: A Little Help needed climbing 3-5 steps with a railing? : A Lot 6 Click Score: 19    End of Session Equipment Utilized During Treatment: Gait belt Activity Tolerance: Patient limited by fatigue Patient left: in chair;with call bell/phone within reach Nurse Communication: Mobility status PT Visit Diagnosis: Unsteadiness on feet (R26.81);Muscle weakness (generalized) (M62.81);Difficulty in walking, not elsewhere classified (R26.2);Other abnormalities of gait and mobility (R26.89)    Time: 1610-9604 PT  Time Calculation (min) (ACUTE ONLY): 14 min   Charges:   PT Evaluation $PT Eval Moderate Complexity: 1 Mod   PT General Charges $$ ACUTE PT VISIT: 1 Visit         Orbin Mayeux, PT, GCS 04/29/23,3:04 PM

## 2023-04-29 NOTE — Interval H&P Note (Signed)
 History and Physical Interval Note:  04/29/2023 8:01 AM  [name]  has presented today for surgery, with the diagnosis of afib.  The various methods of treatment have been discussed with the patient and family. After consideration of risks, benefits and other options for treatment, the patient has consented to  Procedure(s): TRANSESOPHAGEAL ECHOCARDIOGRAM (N/A) CARDIOVERSION (N/A) as a surgical intervention.  The patient's history has been reviewed, patient examined, no change in status, stable for surgery.  I have reviewed the patient's chart and labs.  Questions were answered to the patient's satisfaction.     Wendie Hamburg

## 2023-04-29 NOTE — CV Procedure (Signed)
   TRANSESOPHAGEAL ECHOCARDIOGRAM GUIDED DIRECT CURRENT CARDIOVERSION  NAME:  [name]   MRN: [id number] DOB:  [birth year]   ADMIT DATE: 04/25/2023  INDICATIONS: Symptomatic atrial fibrillation  PROCEDURE:   Informed consent was obtained prior to the procedure. The risks, benefits and alternatives for the procedure were discussed and the patient comprehended these risks.  Risks include, but are not limited to, cough, sore throat, vomiting, nausea, somnolence, esophageal and stomach trauma or perforation, bleeding, low blood pressure, aspiration, pneumonia, infection, trauma to the teeth and death.    After a procedural time-out, the oropharynx was anesthetized and the patient was sedated by the anesthesia service. The transesophageal probe was inserted in the esophagus and stomach without difficulty and multiple views were obtained. Anesthesia was monitored by Dr. Harwood Lingo and Kennis Peacock, CRNA.   COMPLICATIONS:    Complications: Hypotension during procedure, treated with phenylephrine  Patient tolerated procedure well.  FINDINGS:  Smoke in LAA but no thrombus seen.  Severe LV systolic dysfunction   CARDIOVERSION:     Indications:  Symptomatic Atrial Fibrillation  Procedure Details:  Once the TEE was complete, the patient had the defibrillator pads placed in the anterior and posterior position. Once an appropriate level of sedation was confirmed, the patient was cardioverted x 1 with 200J of biphasic synchronized energy.  The patient converted to NSR with PACs/PVCs, rate 60s.  There were no apparent complications.  The patient had normal neuro status and respiratory status post procedure with vitals stable as recorded elsewhere.  Did have hypotension during procedure, treated with phenylephrine . Adequate airway was maintained throughout and vital signs monitored per protocol.  Carson Clara MD [facility]  [address] [place], [place]  [address] [phone]   8:35 AM

## 2023-04-29 NOTE — Anesthesia Preprocedure Evaluation (Signed)
 Anesthesia Evaluation  Patient identified by MRN, date of birth, ID band Patient awake    Reviewed: Allergy  & Precautions, NPO status , Patient's Chart, lab work & pertinent test results  Airway Mallampati: II  TM Distance: >3 FB Neck ROM: Full    Dental   Pulmonary asthma , COPD, former smoker   breath sounds clear to auscultation       Cardiovascular hypertension, Pt. on medications + CAD, + CABG and +CHF  + dysrhythmias Atrial Fibrillation + Valvular Problems/Murmurs (EF 20-25%. Mod MR/TR/ PI) MR  Rhythm:Irregular Rate:Normal     Neuro/Psych negative neurological ROS     GI/Hepatic negative GI ROS, Neg liver ROS,,,  Endo/Other  diabetes    Renal/GU negative Renal ROS     Musculoskeletal   Abdominal   Peds  Hematology negative hematology ROS (+)   Anesthesia Other Findings   Reproductive/Obstetrics                             Anesthesia Physical Anesthesia Plan  ASA: 4  Anesthesia Plan: MAC   Post-op Pain Management:    Induction:   PONV Risk Score and Plan: 1 and Propofol  infusion  Airway Management Planned: Natural Airway and Nasal Cannula  Additional Equipment:   Intra-op Plan:   Post-operative Plan:   Informed Consent: I have reviewed the patients History and Physical, chart, labs and discussed the procedure including the risks, benefits and alternatives for the proposed anesthesia with the patient or authorized representative who has indicated his/her understanding and acceptance.       Plan Discussed with:   Anesthesia Plan Comments:        Anesthesia Quick Evaluation

## 2023-04-29 NOTE — Transfer of Care (Addendum)
 Immediate Anesthesia Transfer of Care Note  Patient: [name]  Procedure(s) Performed: TRANSESOPHAGEAL ECHOCARDIOGRAM CARDIOVERSION  Patient Location: PACU  Anesthesia Type:General  Level of Consciousness: sedated  Airway & Oxygen Therapy: Patient Spontanous Breathing  Post-op Assessment: Report given to RN  Post vital signs: Reviewed and stable  Last Vitals:  Vitals Value Taken Time  BP 107/68 04/29/23 0839  Temp    Pulse 71 04/29/23 0840  Resp 14 04/29/23 0840  SpO2 95 % 04/29/23 0840  Vitals shown include unfiled device data.  Last Pain:  Vitals:   04/29/23 0703  TempSrc: Temporal  PainSc:       Patients Stated Pain Goal: 0 (04/27/23 1940)  Complications: No notable events documented.

## 2023-04-30 ENCOUNTER — Encounter (HOSPITAL_COMMUNITY): Payer: Medicare Other

## 2023-04-30 ENCOUNTER — Ambulatory Visit: Payer: Medicare Other | Admitting: Physician Assistant

## 2023-04-30 ENCOUNTER — Other Ambulatory Visit (HOSPITAL_COMMUNITY): Payer: Self-pay

## 2023-04-30 DIAGNOSIS — I5021 Acute systolic (congestive) heart failure: Secondary | ICD-10-CM | POA: Diagnosis not present

## 2023-04-30 DIAGNOSIS — I4891 Unspecified atrial fibrillation: Secondary | ICD-10-CM | POA: Diagnosis not present

## 2023-04-30 LAB — BASIC METABOLIC PANEL
Anion gap: 15 (ref 5–15)
BUN: 24 mg/dL — ABNORMAL HIGH (ref 8–23)
CO2: 22 mmol/L (ref 22–32)
Calcium: 9.1 mg/dL (ref 8.9–10.3)
Chloride: 103 mmol/L (ref 98–111)
Creatinine, Ser: 1.35 mg/dL — ABNORMAL HIGH (ref 0.61–1.24)
GFR, Estimated: 52 mL/min — ABNORMAL LOW (ref >60)
Glucose, Bld: 106 mg/dL — ABNORMAL HIGH (ref 70–99)
Potassium: 3.9 mmol/L (ref 3.5–5.1)
Sodium: 140 mmol/L (ref 135–145)

## 2023-04-30 LAB — GLUCOSE, CAPILLARY
Glucose-Capillary: 98 mg/dL (ref 70–99)
Glucose-Capillary: 141 mg/dL — ABNORMAL HIGH (ref 70–99)

## 2023-04-30 MED ORDER — AMIODARONE HCL 200 MG PO TABS
ORAL_TABLET | ORAL | 0 refills | Status: DC
  Filled 2023-04-30: qty 42, 36d supply, fill #0

## 2023-04-30 MED ORDER — SPIRONOLACTONE 25 MG PO TABS
12.5000 mg | ORAL_TABLET | Freq: Every day | ORAL | 3 refills | Status: DC
  Filled 2023-04-30: qty 45, 90d supply, fill #0

## 2023-04-30 MED ORDER — METOPROLOL SUCCINATE ER 25 MG PO TB24
25.0000 mg | ORAL_TABLET | Freq: Every day | ORAL | 3 refills | Status: DC
Start: 2023-05-01 — End: 2023-05-13
  Filled 2023-04-30: qty 90, 90d supply, fill #0

## 2023-04-30 MED ORDER — DAPAGLIFLOZIN PROPANEDIOL 10 MG PO TABS
10.0000 mg | ORAL_TABLET | Freq: Every day | ORAL | 3 refills | Status: DC
  Filled 2023-04-30: qty 30, 30d supply, fill #0

## 2023-04-30 NOTE — Progress Notes (Signed)
Mobility Specialist Progress Note;   04/30/23 0940  Mobility  Activity Ambulated with assistance in hallway  Level of Assistance Contact guard assist, steadying assist  Assistive Device None  Distance Ambulated (ft) 400 ft  Activity Response Tolerated fair  Mobility Referral Yes  Mobility visit 1 Mobility  Mobility Specialist Start Time (ACUTE ONLY) 0940  Mobility Specialist Stop Time (ACUTE ONLY) 0950  Mobility Specialist Time Calculation (min) (ACUTE ONLY) 10 min   Pt agreeable to mobility. Required MinG assistance during ambulation for safety. Took 2x standing rest break d/t fatigue and lack of endurance. However, VSS throughout and no c/o during session. Pt returned back to bed with all needs met, alarm on.   Caesar Bookman Mobility Specialist Please contact via SecureChat or [employer] [phone]

## 2023-04-30 NOTE — Progress Notes (Cosign Needed Addendum)
   Patient Name: [name] Date of Encounter: 04/30/2023 [facility] Cardiologist: Chrystie Nose, MD   Interval Summary  .    Patient states he feels much improved compared to yesterday He has been walking around without significant dyspnea He is ready to go home today He has tolerated new/increased medications well, HR 70-80s, BP 105/79 Consider adding on ARB or ARNI at outpatient follow up to see if he tolerates it with his BP  Vital Signs .    Vitals:   04/30/23 0000 04/30/23 0314 04/30/23 0400 04/30/23 0753  BP:  105/79    Pulse:  69  72  Resp:  20    Temp:  (!) 97.4 F (36.3 C)    TempSrc:  Oral    SpO2: 95% 94% 93%   Weight:      Height:        Intake/Output Summary (Last 24 hours) at 04/30/2023 1027 Last data filed at 04/30/2023 1610 Gross per 24 hour  Intake 120 ml  Output 650 ml  Net -530 ml      04/26/2023    2:01 PM 04/25/2023    8:36 PM 04/25/2023   11:32 AM  Last 3 Weights  Weight (lbs) 136 lb 12.8 oz 135 lb 135 lb  Weight (kg) 62.052 kg 61.236 kg 61.236 kg      Telemetry/ECG    Sinus rhythm, HR 70s, frequent PVCs - Personally Reviewed  Physical Exam .   GEN: No acute distress.   Neck: No JVD Cardiac: RRR, no murmurs, rubs, or gallops.  Respiratory: Clear to auscultation bilaterally. GI: Soft, nontender, non-distended  MS: No edema  Assessment & Plan .     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 Fib with HR 114 BNP 1246 CXR showed mild central pulmonary vascular congestion, small pleural effusion  Echo this admission: LVEF 20-25%, no RWMA, mildly reduced RV function, mildly elevated PA systolic pressure, moderate MR, moderate TR Underwent TEE guided DCCV on 04/29/2023 and has been in NSR since  Most recent BP 105/79 Continue Toprol 25 mg daily Continue Farxiga 10 mg daily Continue spironolactone 12.5 mg daily  Continue PO Lasix 40 mg daily Repeat echocardiogram in 3  months, if EF remained reduced consider ischemic workup  Consider adding ARB or ARNI outpatient due to softer BP   Follow up appointment made 05/13/2023 with general cardiology  CAD s/p CABG Underwent CABG in 2008 with LIMA to LAD, SVG to PDA, and SVG to OM1 and OM 2  Denies any chest pain  Not currently on ASA due to Eliquis  Continue pravastatin 40 mg daily Continue Toprol 25 mg daily  Persistent Atrial Fibrillation  Patient underwent TEE guided DCCV on 04/29/2023 Remained in NSR since DCCV Continue PO amiodarone (400 mg daily x 1 week, 200 mg daily after) Continue Toprol 25 mg daily Continue Eliquis 5 mg BID, appropriately dosed    For questions or updates, please contact [facility] Please consult [email or web address] for contact info under       Signed, Olena Leatherwood, PA-C

## 2023-04-30 NOTE — TOC Initial Note (Signed)
Transition of Care [facility]) - Initial/Assessment Note    Patient Details  Name: [name] MRN: [id number] Date of Birth: Jul 15, 1940  Transition of Care [facility]) CM/SW Contact:    Gala Lewandowsky, RN Phone Number: 04/30/2023, 1:09 PM  Clinical Narrative:  Patient presented for atrial fibrillation. PTA patient was independent from home with spouse. Spouse was at the bedside during the visit. Patient has PCP and gets to appointments without any issues. No home needs identified during the visit. Spouse to transport home via private vehicle once stable.              Expected Discharge Plan: Home/Self Care Barriers to Discharge: No Barriers Identified   Patient Goals and CMS Choice Patient states their goals for this hospitalization and ongoing recovery are:: Plan to retrun home   Choice offered to / list presented to : NA      Expected Discharge Plan and Services In-house Referral: NA Discharge Planning Services: CM Consult Post Acute Care Choice: NA Living arrangements for the past 2 months: Single Family Home                   DME Agency: NA       HH Arranged: NA   Prior Living Arrangements/Services Living arrangements for the past 2 months: Single Family Home Lives with:: Spouse Patient language and need for interpreter reviewed:: Yes Do you feel safe going back to the place where you live?: Yes      Need for Family Participation in Patient Care: Yes (Comment) Care giver support system in place?: Yes (comment)   Criminal Activity/Legal Involvement Pertinent to Current Situation/Hospitalization: No - Comment as needed  Activities of Daily Living   ADL Screening (condition at time of admission) Independently performs ADLs?: Yes (appropriate for developmental age) Is the patient deaf or have difficulty hearing?: Yes Does the patient have difficulty seeing, even when wearing glasses/contacts?: No Does the patient have difficulty concentrating, remembering, or  making decisions?: No  Permission Sought/Granted Permission sought to share information with : Case Manager, Family Supports  Emotional Assessment Appearance:: Appears stated age Attitude/Demeanor/Rapport: Engaged Affect (typically observed): Appropriate Orientation: : Oriented to Self, Oriented to Place, Oriented to  Time, Oriented to Situation Alcohol / Substance Use: Not Applicable Psych Involvement: No (comment)  Admission diagnosis:  SOB (shortness of breath) [R06.02] Atrial fibrillation with RVR (HCC) [I48.91] Atrial fibrillation, unspecified type (HCC) [I48.91] Patient Active Problem List   Diagnosis Date Noted   Atrial fibrillation status post cardioversion 04/29/2023 (HCC) 04/25/2023   SOB (shortness of breath) 04/25/2023   Acute on chronic heart failure with preserved ejection fraction (HCC) 04/25/2023   Respiratory failure with hypoxia secondary to RSV 03/08/2023   RSV (respiratory syncytial virus pneumonia) 03/08/2023   Paroxysmal atrial fibrillation (HCC) 03/08/2023   Allergic rhinitis 12/22/2021   Healthcare maintenance 12/04/2019   Abnormal finding on lung imaging 05/14/2018   Former smoker 05/14/2018   Acute exacerbation of COPD with asthma (HCC) 04/02/2018   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   Cardiomyopathy, 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   Hypertriglyceridemia 02/03/2016   Prostate cancer (HCC) 02/03/2016   Essential hypertension 02/03/2016   Environmental allergies 02/03/2016   CAD (coronary artery disease) 02/05/2013   CAD S/P CABG x 4 in 2008 02/05/2013   DM2 (diabetes mellitus, type 2) (HCC) 02/05/2013   Dyslipidemia 02/05/2013   PCP:  Merri Brunette, MD Pharmacy:   [id number] - [place],  - [address] AT [facility] [address] [place] [address] Phone: [phone] Fax: [phone]  [facility] [address] [place] [address] Phone: [phone] Fax: [phone]  Social Drivers of Health (SDOH) Social History: SDOH Screenings   Food Insecurity: No Food Insecurity (04/26/2023)  Housing: Low Risk  (04/26/2023)  Transportation Needs: No Transportation Needs (04/26/2023)  Utilities: Not At Risk (04/26/2023)  Depression (PHQ2-9): Low Risk  (04/01/2023)  Social Connections: Moderately Isolated (04/26/2023)  Tobacco Use: Medium Risk (04/29/2023)   SDOH Interventions:     Readmission Risk Interventions     No data to display

## 2023-04-30 NOTE — Anesthesia Postprocedure Evaluation (Signed)
Anesthesia Post Note  Patient: [name]  Procedure(s) Performed: TRANSESOPHAGEAL ECHOCARDIOGRAM CARDIOVERSION     Patient location during evaluation: PACU Anesthesia Type: General Level of consciousness: awake and alert Pain management: pain level controlled Vital Signs Assessment: post-procedure vital signs reviewed and stable Respiratory status: spontaneous breathing, nonlabored ventilation, respiratory function stable and patient connected to nasal cannula oxygen Cardiovascular status: blood pressure returned to baseline and stable Postop Assessment: no apparent nausea or vomiting Anesthetic complications: no   No notable events documented.  Last Vitals:  Vitals:   04/30/23 0400 04/30/23 0753  BP:    Pulse:  72  Resp:    Temp:    SpO2: 93%     Last Pain:  Vitals:   04/30/23 0845  TempSrc:   PainSc: 0-No pain                 Kennieth Rad

## 2023-04-30 NOTE — Care Management Important Message (Signed)
Important Message  Patient Details  Name: [name] MRN: [id number] Date of Birth: 1941-02-25   Important Message Given:  Yes - Medicare IM     Renie Ora 04/30/2023, 11:34 AM

## 2023-04-30 NOTE — Discharge Summary (Addendum)
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 Left ventricular ejection fraction, by estimation, is 20 to 25%. The  left ventricle has severely decreased function. The left ventricle has no  regional wall motion abnormalities. Left ventricular diastolic function  could not be evaluated.   2. Right ventricular systolic function is mildly reduced. The right  ventricular size is moderately enlarged. There is mildly elevated  pulmonary artery systolic pressure.   3. Left atrial size was moderately dilated.   4. Right atrial size was severely dilated.   5. Restricted leaflet motion, anteriorly directed eccentric jet, at least  moderate MR. . The mitral valve is normal in structure. Moderate mitral  valve regurgitation. No evidence of mitral stenosis.   6. Tricuspid valve regurgitation is moderate.   7. The aortic valve is normal in structure. There is mild calcification  of the aortic valve. There is mild thickening of the aortic valve. Aortic  valve regurgitation is not visualized. No aortic stenosis is present.   8. Pulmonic valve regurgitation is moderate.   9. The inferior vena cava is dilated in size with <50% respiratory  variability, suggesting right atrial pressure of 15 mmHg.   TEE (04/29/2023) IMPRESSIONS   1. Left ventricular  ejection fraction, by estimation, is 20 to 25%. The  left ventricle has severely decreased function. The left ventricle  demonstrates global hypokinesis. The left ventricular internal cavity size  was mildly dilated.   2. Right ventricular systolic function is moderately reduced. The right  ventricular size is moderately enlarged.   3. Left atrial size was moderately dilated. No left atrial/left atrial  appendage thrombus was detected. Smoke in LAA, but no thrombus seen   4. Right atrial size was severely dilated.   5. The mitral valve is normal in structure. Mild mitral valve  regurgitation.   6. Tricuspid valve regurgitation is mild to moderate.   7. The aortic valve is tricuspid. Aortic valve regurgitation is trivial.  No aortic stenosis is present.   _____________   History of Present Illness     [name] is a 83 y.o. 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   He presented to the ED on 04/25/2023 complaining of shortness of breath and weakness. The EKG in the ED showed atrial fibrillation with RVR, HR 114 bpm with RBBB and non-specific T wave changes. BNP was elevated as 1,246, CXR showed mild central pulmonary vascular congestion with mild bibasilar subsegmental atelectasis or edema with small pleural effusions. WBC 9.3, Hgb 13.0, Plts 238. Na 137, K 4.0, Glucose 271, BUN 36, Cr 1.23   Hospital Course  He presented to  the ED on 04/25/2023 complaining of shortness of breath and weakness. The EKG in the ED showed atrial fibrillation with RVR, HR 114 bpm with RBBB and non-specific T wave changes. BNP was elevated as 1,246, CXR showed mild central pulmonary vascular congestion with mild bibasilar subsegmental atelectasis or edema with  small pleural effusions. WBC 9.3, Hgb 13.0, Plts 238. Na 137, K 4.0, Glucose 271, BUN 36, Cr 1.23     He was admitted to cardiology with plans for a TEE guided cardioversion for his atrial fibrillation as well as management of acute on chronic HFmrEF, CAD, NSVT/PVCs, hypertension, diabetes and COPD.   He was given IV Lopressor in the ED with improvement of his HR. He became mildly hypotensive and was given IV Digoxin 0.125 mg x 1 dose. He was then started on home Lopressor dose 50 mg BID, continued on Eliquis 5 mg BID, diuresed with IV Lasix in ED and had good response, continued on home statin, continued home inhalers for COPD and started on SSI for diabetes.   He was scheduled to get his TEE guided DCCV on 04/26/2023 but had breakfast in the morning and therefore the procedure was canceled. TTE was ordered which showed LVEF 20-25%, therefore metoprolol was held and IV amiodarone was started for arte control.   He was seen by advanced heart failure who suspected that acute drop in EF was secondary to atrial fibrillation. They continued IV Lasix 40 mg BID, IV amiodarone, started digoxin 125 mcg daily, spironolactone 12.5 mg daily, with plans to start SGL2Ti, ARB and resume BB when BP stabilized.   He maintained stable over the weekend with plans to cardiovert on Monday 04/29/2023. Over the weekend he was started on Farxiga 10 mg daily along with all previously listed GDMT and underwent successful cardioversion on Monday 04/29/2023 requiring 200 J x 1 with no complications. Converting to NSR with PAC/PVCs, HR in the 60s. He became hypotensive during DCCV and was treated with phenylephrine. He has remained in NSR since cardioversion.  Post-cardioversion he was switched from IV to PO amiodarone with plans to dose 400 mg daily x 7 days then 200 mg daily after, was restarted on home PO Lasix 40 mg daily, restarted on metoprolol succinate 25 mg daily.   He recovered well after his cardioversion. Ambulating in  the hallway with improvement in symptoms with each day. He was seen on 04/30/2023 and deemed medically stable for discharge by MD.  His outpatient treatment plan is as follows:  Acute HFrEF  Moderate MR, Moderate TR  Continue Toprol 25 mg daily Continue Farxiga 10 mg daily Continue spironolactone 12.5 mg daily  Continue PO Lasix 40 mg daily Consider adding ARB or ARNI outpatient due to softer BP  Repeat echocardiogram in 3 months, if EF remained reduced consider ischemic workup   CAD s/p CABG Continue pravastatin 40 mg daily Continue Toprol 25 mg daily Not on ASA due to Eliquis   Persistent Atrial Fibrillation s/p TEE guided DCCV 04/29/2023 Continue PO amiodarone (400 mg daily started 2/10 x 1 week, 200 mg daily after) Continue Toprol 25 mg daily Continue Eliquis 5 mg BID, appropriately dosed  He has a follow up appointment made for 05/13/2023      Did the patient have an acute coronary syndrome (MI, NSTEMI, STEMI, etc) this admission?:  No  Did the patient have a percutaneous coronary intervention (stent / angioplasty)?:  No.      ____________  Discharge Vitals Blood pressure 97/86, pulse 70, temperature 98 F (36.7 C), temperature source Oral, resp. rate 19, height 5\' 6"  (1.676 m), weight 62.1 kg, SpO2 93%.  Filed Weights   04/25/23 1132 04/25/23 2036 04/26/23 1401  Weight: 61.2 kg 61.2 kg 62.1 kg   Labs & Radiologic Studies    CBC Recent Labs    04/29/23 0404  WBC 9.1  HGB 13.3  HCT 40.9  MCV 89.7  PLT 224   Basic Metabolic Panel Recent Labs    40/98/11 0404 04/30/23 0408  NA 141 140  K 4.1 3.9  CL 102 103  CO2 20* 22  GLUCOSE 148* 106*  BUN 24* 24*  CREATININE 1.14 1.35*  CALCIUM 9.8 9.1   Liver Function Tests No results for input(s): "AST", "ALT", "ALKPHOS", "BILITOT", "PROT", "ALBUMIN" in the last 72 hours. No results for input(s): "LIPASE", "AMYLASE" in the last 72 hours. High Sensitivity Troponin:   No results for  input(s): "TROPONINIHS" in the last 720 hours.  BNP Invalid input(s): "POCBNP" D-Dimer No results for input(s): "DDIMER" in the last 72 hours. Hemoglobin A1C No results for input(s): "HGBA1C" in the last 72 hours. Fasting Lipid Panel No results for input(s): "CHOL", "HDL", "LDLCALC", "TRIG", "CHOLHDL", "LDLDIRECT" in the last 72 hours. Thyroid Function Tests No results for input(s): "TSH", "T4TOTAL", "T3FREE", "THYROIDAB" in the last 72 hours.  Invalid input(s): "FREET3" _____________  ECHO TEE Result Date: 04/29/2023    TRANSESOPHOGEAL ECHO REPORT   Patient Name:   [name] [facility] Date of Exam: 04/29/2023 Medical Rec #:  [id number]      Height:       66.0 in Accession #:    [id number]     Weight:       136.8 lb Date of Birth:  [birth year]      BSA:          1.702 m Patient Age:    82 years       BP:           107/72 mmHg Patient Gender: M              HR:           101 bpm. Exam Location:  Inpatient Procedure: Cardiac Doppler, Color Doppler and Transesophageal Echo Indications:     Cadrioversion  History:         Patient has prior history of Echocardiogram examinations, most                  recent 04/23/2023.  Sonographer:     Harriette Bouillon RDCS Referring Phys:  [id number] Little Ishikawa Diagnosing Phys: Epifanio Lesches MD PROCEDURE: The transesophogeal probe was passed without difficulty through the esophogus of the patient. Sedation performed by different physician. The patient developed no complications during the procedure.  IMPRESSIONS  1. Left ventricular ejection fraction, by estimation, is 20 to 25%. The left ventricle has severely decreased function. The left ventricle demonstrates global hypokinesis. The left ventricular internal cavity size was mildly dilated.  2. Right ventricular systolic function is moderately reduced. The right ventricular size is moderately enlarged.  3. Left atrial size was moderately dilated. No left atrial/left atrial appendage thrombus was detected. Smoke in  LAA, but no thrombus seen  4. Right atrial size was severely dilated.  5. The mitral valve is normal in structure. Mild mitral valve regurgitation.  6. Tricuspid valve regurgitation is mild to moderate.  7. The aortic valve is tricuspid. Aortic valve regurgitation is trivial. No aortic stenosis is present. Conclusion(s)/Recommendation(s): No LA/LAA thrombus identified. Successful cardioversion performed with restoration of normal sinus rhythm. FINDINGS  Left Ventricle: Left ventricular ejection fraction, by estimation, is 20 to 25%. The left ventricle has severely decreased function. The left ventricle demonstrates global hypokinesis. The left ventricular internal cavity size was mildly dilated. Right Ventricle: The right ventricular size is moderately enlarged. No increase in right ventricular wall thickness. Right ventricular systolic function is moderately reduced. Left Atrium: Left atrial size was moderately dilated. No left atrial/left atrial appendage thrombus was detected. Right Atrium: Right atrial size was severely dilated. Pericardium: There is no evidence of pericardial effusion. Mitral Valve: The mitral valve is normal in structure. Mild mitral valve regurgitation. Tricuspid Valve: The tricuspid valve is normal in structure. Tricuspid valve regurgitation is mild to moderate. Aortic Valve: The aortic valve is tricuspid. Aortic valve regurgitation is trivial. No aortic stenosis is present. Pulmonic Valve: The pulmonic valve was grossly normal. Pulmonic valve regurgitation is trivial. Aorta: The aortic root and ascending aorta are structurally normal, with no evidence of dilitation. IAS/Shunts: No atrial level shunt detected by color flow Doppler. Epifanio Lesches MD Electronically signed by Epifanio Lesches MD Signature Date/Time: 04/29/2023/3:16:49 PM    Final    EP STUDY Result Date: 04/29/2023 See surgical note for result.  ECHOCARDIOGRAM COMPLETE Result Date: 04/26/2023    ECHOCARDIOGRAM  REPORT   Patient Name:   [name] [facility] Date of Exam: 04/26/2023 Medical Rec #:  [id number]      Height:       66.0 in Accession #:    [id number]     Weight:       135.0 lb Date of Birth:  1941/01/23      BSA:          1.692 m Patient Age:    82 years       BP:           102/80 mmHg Patient Gender: M              HR:           101 bpm. Exam Location:  Inpatient Procedure: 2D Echo, Cardiac Doppler, Color Doppler and Intracardiac            Opacification Agent Indications:    I48.91* Unspeicified atrial fibrillation; R06.02 SOB; R60.0                 Lower extremity edema  History:        Patient has prior history of Echocardiogram examinations, most                 recent 03/08/2016. CHF and Cardiomyopathy, CAD, Abnormal ECG,                 COPD, Arrythmias:Atrial Fibrillation, Signs/Symptoms:Shortness                 of Breath and Dyspnea; Risk Factors:Dyslipidemia and Former                 Smoker.  Sonographer:    Sheralyn Boatman RDCS Referring Phys: [id number] CALLIE E GOODRICH IMPRESSIONS  1. Left ventricular ejection fraction, by estimation, is 20 to 25%. The left ventricle has severely decreased function. The left ventricle has no regional wall motion abnormalities. Left ventricular diastolic function could not be evaluated.  2. Right ventricular systolic function is  mildly reduced. The right ventricular size is moderately enlarged. There is mildly elevated pulmonary artery systolic pressure.  3. Left atrial size was moderately dilated.  4. Right atrial size was severely dilated.  5. Restricted leaflet motion, anteriorly directed eccentric jet, at least moderate MR. . The mitral valve is normal in structure. Moderate mitral valve regurgitation. No evidence of mitral stenosis.  6. Tricuspid valve regurgitation is moderate.  7. The aortic valve is normal in structure. There is mild calcification of the aortic valve. There is mild thickening of the aortic valve. Aortic valve regurgitation is not visualized. No aortic  stenosis is present.  8. Pulmonic valve regurgitation is moderate.  9. The inferior vena cava is dilated in size with <50% respiratory variability, suggesting right atrial pressure of 15 mmHg. Comparison(s): LV function significantly less. FINDINGS  Left Ventricle: Left ventricular ejection fraction, by estimation, is 20 to 25%. The left ventricle has severely decreased function. The left ventricle has no regional wall motion abnormalities. Definity contrast agent was given IV to delineate the left  ventricular endocardial borders. The left ventricular internal cavity size was normal in size. There is no left ventricular hypertrophy. Left ventricular diastolic function could not be evaluated due to atrial fibrillation. Left ventricular diastolic function could not be evaluated. Right Ventricle: The right ventricular size is moderately enlarged. No increase in right ventricular wall thickness. Right ventricular systolic function is mildly reduced. There is mildly elevated pulmonary artery systolic pressure. The tricuspid regurgitant velocity is 2.45 m/s, and with an assumed right atrial pressure of 15 mmHg, the estimated right ventricular systolic pressure is 39.0 mmHg. Left Atrium: Left atrial size was moderately dilated. Right Atrium: Right atrial size was severely dilated. Pericardium: There is no evidence of pericardial effusion. Mitral Valve: Restricted leaflet motion, anteriorly directed eccentric jet, at least moderate MR. The mitral valve is normal in structure. Moderately decreased mobility of the mitral valve leaflets. Moderate mitral valve regurgitation. No evidence of mitral valve stenosis. Tricuspid Valve: The tricuspid valve is normal in structure. Tricuspid valve regurgitation is moderate . No evidence of tricuspid stenosis. Aortic Valve: The aortic valve is normal in structure. There is mild calcification of the aortic valve. There is mild thickening of the aortic valve. Aortic valve regurgitation is  not visualized. No aortic stenosis is present. Pulmonic Valve: The pulmonic valve was normal in structure. Pulmonic valve regurgitation is moderate. No evidence of pulmonic stenosis. Aorta: The aortic root is normal in size and structure. Venous: The inferior vena cava is dilated in size with less than 50% respiratory variability, suggesting right atrial pressure of 15 mmHg. IAS/Shunts: No atrial level shunt detected by color flow Doppler.  LEFT VENTRICLE PLAX 2D LVIDd:         5.10 cm LVIDs:         4.70 cm LV PW:         1.00 cm LV IVS:        1.00 cm LVOT diam:     2.20 cm LV SV:         34 LV SV Index:   20 LVOT Area:     3.80 cm  LV Volumes (MOD) LV vol d, MOD A2C: 98.3 ml LV vol d, MOD A4C: 131.0 ml LV vol s, MOD A2C: 83.4 ml LV vol s, MOD A4C: 85.1 ml LV SV MOD A2C:     14.9 ml LV SV MOD A4C:     131.0 ml LV SV MOD BP:  33.0 ml IVC IVC diam: 2.50 cm LEFT ATRIUM             Index        RIGHT ATRIUM           Index LA diam:        4.90 cm 2.90 cm/m   RA Area:     27.90 cm LA Vol (A2C):   34.2 ml 20.21 ml/m  RA Volume:   91.40 ml  54.01 ml/m LA Vol (A4C):   51.5 ml 30.43 ml/m LA Biplane Vol: 41.5 ml 24.52 ml/m  AORTIC VALVE             PULMONIC VALVE LVOT Vmax:   66.15 cm/s  PR End Diast Vel: 2.07 msec LVOT Vmean:  44.700 cm/s LVOT VTI:    0.088 m  AORTA Ao Root diam: 3.00 cm Ao Asc diam:  3.30 cm MITRAL VALVE                  TRICUSPID VALVE MV Area (PHT): 6.24 cm       TR Peak grad:   24.0 mmHg MV Decel Time: 122 msec       TR Vmax:        245.00 cm/s MR Peak grad:    64.0 mmHg MR Mean grad:    45.0 mmHg    SHUNTS MR Vmax:         400.00 cm/s  Systemic VTI:  0.09 m MR Vmean:        312.0 cm/s   Systemic Diam: 2.20 cm MR PISA:         0.57 cm MR PISA Eff ROA: 6 mm MR PISA Radius:  0.30 cm MV E velocity: 88.80 cm/s Clearnce Hasten Electronically signed by Clearnce Hasten Signature Date/Time: 04/26/2023/11:51:05 AM    Final    DG Chest 1 View Result Date: 04/25/2023 CLINICAL DATA:  Shortness of  breath. EXAM: CHEST  1 VIEW COMPARISON:  March 10, 2023. FINDINGS: Stable cardiomediastinal silhouette. Status post coronary artery bypass graft. Mild central pulmonary vascular congestion is noted. Mild bibasilar atelectasis or edema is noted with small pleural effusions. Bony thorax is unremarkable. IMPRESSION: Mild central pulmonary vascular congestion with mild bibasilar subsegmental atelectasis or edema with small pleural effusions. Electronically Signed   By: Lupita Raider M.D.   On: 04/25/2023 12:30   Disposition   Pt is being discharged home today in good condition.  Follow-up Plans & Appointments  Follow up appointment made 05/13/2023 at 2:20 PM with Bernadene Person, NP at our [facility] office.   Patient will need outpatient updated echocardiogram in 3 months   Discharge Medications   Allergies as of 04/30/2023       Reactions   Other Shortness Of Breath, Other (See Comments)   Feline dander   Lisinopril Cough        Medication List     STOP taking these medications    diltiazem 180 MG 24 hr capsule Commonly known as: CARDIZEM CD   metoprolol tartrate 50 MG tablet Commonly known as: LOPRESSOR       TAKE these medications    albuterol 108 (90 Base) MCG/ACT inhaler Commonly known as: VENTOLIN HFA Inhale 2 puffs into the lungs every 6 (six) hours as needed for wheezing or shortness of breath.   amiodarone 200 MG tablet Commonly known as: PACERONE Take 2 tablets (400 mg total) by mouth daily for 6 days, THEN 1 tablet (200 mg total) daily. Start  taking on: May 01, 2023   dapagliflozin propanediol 10 MG Tabs tablet Commonly known as: FARXIGA Take 1 tablet (10 mg total) by mouth daily. Start taking on: May 01, 2023   Eliquis 5 MG Tabs tablet Generic drug: apixaban Take 1 tablet (5 mg total) by mouth 2 (two) times daily.   folic acid 1 MG tablet Commonly known as: FOLVITE Take 1 mg by mouth daily.   furosemide 40 MG tablet Commonly known as:  LASIX Take 1 tablet (40 mg total) by mouth daily.   HYDROcodone bit-homatropine 5-1.5 MG/5ML syrup Commonly known as: HYCODAN Take 5 mLs by mouth every 4 (four) hours as needed for cough.   metFORMIN 500 MG tablet Commonly known as: GLUCOPHAGE Take 500 mg by mouth daily with breakfast.   metoprolol succinate 25 MG 24 hr tablet Commonly known as: TOPROL-XL Take 1 tablet (25 mg total) by mouth daily. Start taking on: May 01, 2023   pravastatin 40 MG tablet Commonly known as: PRAVACHOL Take 1 tablet (40 mg total) by mouth daily.   spironolactone 25 MG tablet Commonly known as: ALDACTONE Take 0.5 tablets (12.5 mg total) by mouth daily. Start taking on: May 01, 2023   Symbicort 80-4.5 MCG/ACT inhaler Generic drug: budesonide-formoterol INHALE 2 PUFFS BY MOUTH IN THE MORNING AND 2 PUFFS AT BEDTIME   VITAMIN B-12 SL Place 1 tablet under the tongue daily.   Vitamin D3 25 MCG (1000 UT) Caps Take 1,000 Units by mouth daily.        Outstanding Labs/Studies   Outpatient echocardiogram in 3 months  Duration of Discharge Encounter: APP Time: 25 minutes   Signed, Olena Leatherwood, PA-C 04/30/2023, 1:19 PM     Patient Name: [name] Date of Encounter: 04/30/2023 [facility] Cardiologist: Chrystie Nose, MD   Interval Summary  .    Feels well overall, walked easier today. Feels ready to go home.   Vital Signs .    Vitals:   04/30/23 0314 04/30/23 0400 04/30/23 0753 04/30/23 1148  BP: 105/79   97/86  Pulse: 69  72 70  Resp: 20   19  Temp: (!) 97.4 F (36.3 C)   98 F (36.7 C)  TempSrc: Oral   Oral  SpO2: 94% 93%    Weight:      Height:        Intake/Output Summary (Last 24 hours) at 04/30/2023 2315 Last data filed at 04/30/2023 0845 Gross per 24 hour  Intake 480 ml  Output 650 ml  Net -170 ml      04/26/2023    2:01 PM 04/25/2023    8:36 PM 04/25/2023   11:32 AM  Last 3 Weights  Weight (lbs) 136 lb 12.8 oz 135 lb 135 lb  Weight  (kg) 62.052 kg 61.236 kg 61.236 kg      Telemetry/ECG    SR - Personally Reviewed  Physical Exam .   GEN: No acute distress.   Neck: No JVD Cardiac: RRR, no murmurs, rubs, or gallops.  Respiratory: Clear to auscultation bilaterally. GI: Soft, nontender, non-distended  MS: No edema  Assessment & Plan .     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 On Eliquis, no bleeding.   HFrEF- medical therapy with toprol, farxiga and spironolactone. Agree that adding ARB or ARNI as outpt to ensure adequate BP. F/u 2/24.   Total time of encounter: 32 minutes total time of encounter, including 15 minutes spent in face-to-face patient care on the date of this encounter. This time includes coordination of care and counseling regarding above mentioned problem list. Remainder of non-face-to-face time involved reviewing chart documents/testing relevant to the patient encounter and documentation in the medical record.    For questions or updates, please contact [facility] Please consult [email or web address] for contact info under        Signed, Parke Poisson, MD

## 2023-05-01 ENCOUNTER — Telehealth: Payer: Self-pay | Admitting: Internal Medicine

## 2023-05-01 ENCOUNTER — Other Ambulatory Visit: Payer: Self-pay

## 2023-05-01 DIAGNOSIS — Z79899 Other long term (current) drug therapy: Secondary | ICD-10-CM

## 2023-05-01 DIAGNOSIS — I5032 Chronic diastolic (congestive) heart failure: Secondary | ICD-10-CM

## 2023-05-01 NOTE — Telephone Encounter (Signed)
Chrystie Nose, MD  You27 minutes ago (11:24 AM)    Potassium is ok yesterday at 3.9 - he is on spironolactone which helps hold onto potassium in addition to the lasix. His creatinine is a little higher- this may be his new heart failure medications. Would repeat BMET next week -he has follow-up on 2/24.  Dr [name]     Called wife and discussed above message. Informed them that lab has been ordered and he doesn't need to call for an appointment with the lab. Wife also states they are feeling overwhelmed since coming home from the hospital yesterday. Today he was to take 13 medications this morning and they are very concerned about taking so many pills together.  I informed them that I would place a consult with our pharmD dept to call them to review meds and see which ones could be taken in the evening instead. She voiced understanding and reminded about upcoming appt.

## 2023-05-01 NOTE — Telephone Encounter (Signed)
Pt c/o medication issue:  1. Name of Medication:   furosemide (LASIX) 40 MG tablet    2. How are you currently taking this medication (dosage and times per day)? As written  3. Are you having a reaction (difficulty breathing--STAT)? No   4. What is your medication issue? Wife is questioning that he should be on potassium if taking furosemide

## 2023-05-02 ENCOUNTER — Other Ambulatory Visit: Payer: Self-pay | Admitting: *Deleted

## 2023-05-02 ENCOUNTER — Telehealth: Payer: Self-pay | Admitting: Internal Medicine

## 2023-05-02 ENCOUNTER — Encounter (HOSPITAL_COMMUNITY): Payer: Medicare Other

## 2023-05-02 DIAGNOSIS — I5032 Chronic diastolic (congestive) heart failure: Secondary | ICD-10-CM

## 2023-05-02 NOTE — Telephone Encounter (Signed)
Matter handled in another message. See chart.

## 2023-05-02 NOTE — Telephone Encounter (Signed)
Wife [name]) stated patient has a history of Afib and recently developed gout in his knee.  Wife wants to know which pain medication patient can take.

## 2023-05-02 NOTE — Telephone Encounter (Signed)
Late entry  wife came by the office  she  stated she did not receive instruction for what to do with patient mediction   She did call in to the office x 2 today.   The call was answered  - she was told once we received information from  Dr [name] we will contact her.  Triage has not had a reply back    Wife states today's   morning blood pressure was low ( it is documented)  Per wife, patient wakes up late @ 10  in the morning the  b/p was prior  eating breakfast and moving around  Reviewing medication  wife states she gave patient  farxiga , eliquis, metformin ,pravastatin   Reviewing wife realized patient should be taking Aldactone 12.5 mg ( 1/2 tablet of 25 mg) but she gave him a whole tablet yesterday   This afternoon b/p 11:20 102/55 pulse 69  , 1:30 pm  119/65  pulse 90 . Patient is not symptomatic  per wife.   RN recommend giving patient Metoprolol in the evening  and amiodarone  and aldactone in the morning after patient has eaten breakfast and moved around.   Wife is aware to give lasix as needed basis. Will contact primary in regards possible medication for gout .  Any labs will need to come from primary in regards to gout. ( Per Pharmacist recommendation     Patient is to have BMP done on 05/07/23  Aware we are awaiting Dr [name] response

## 2023-05-02 NOTE — Telephone Encounter (Signed)
Pt c/o BP issue: STAT if pt c/o blurred vision, one-sided weakness or slurred speech  1. What are your last 5 BP readings? 98/66 103/44 HR 69  2. Are you having any other symptoms (ex. Dizziness, headache, blurred vision, passed out)?   3. What is your BP issue? Patient had cardioversion done recently and released from hospital. States that BP is running slightly lower and would like to discuss if there should be a medication change for this. Please advise.

## 2023-05-02 NOTE — Telephone Encounter (Signed)
   Furosemide can increase risk for gout attacks. His current dose of furosemide 40 mg daily if there is no swelling/ water retention can switch daily dose to PRN  Tylene Fantasia, RPH to [name], RN     05/02/23  2:32 PM  Ibuprofen (Advil, Motrin) and naproxen sodium (Aleve) are over-the-counter (OTC) medications that can help with pain and swelling during a gout attack. However those are not safe to take with Eliquis - it enhances bleeding risk. Would advised to check with PCP for other safe gout treatment. Tylene Fantasia, RPH to [place], [place]     05/02/23  2:39 PM

## 2023-05-02 NOTE — Telephone Encounter (Signed)
Patient identification verified by 2 forms. [name], RN    Called and spoke to patients wife [name] states:   -patient developed gout in knees   -taking a lot of medication that affect blood pressure   -would like to know which of his medications he can take  -He has not taken any of his medications today   -she is concerned about giving him medications that affect his BP   -concerned about giving Lasix due to possible gout  -2/13: 10:50am 95/46 HR: 70 -2/13: 9:45am 103/44 HR: 69 -2/13: 8:40am 98/66 HR: 66  Brenda denies:   -lightheadedness/dizziness  Informed Brenda:   -Message sent to Dr. [name] and Pharmacy for input   -can hold Amiodarone, lasix and Metoprolol for now   -can give patient all other morning medications  [name] verbalized understanding, no questions at this time

## 2023-05-03 NOTE — Telephone Encounter (Signed)
Wife [name]) wants to know if patient can take Extra Strength Tylenol for pain in knee.

## 2023-05-03 NOTE — Telephone Encounter (Signed)
Chrystie Nose, MD  [name], RN16 hours ago (7:16 PM)    [name] - I agree with those recommendations and that from our pharmacist. He can use the lasix 40 mg daily as needed for weight gain or swelling - hold the blood pressure lowering medicines and monitor bp.  If his volume status improves without the lasix, then blood pressure may go back up and he may be able to restart some or all of BP meds.  Dr. Rexene Edison   Patient identification verified by 2 forms. [name], RN    Called and spoke to patients [name] states:   -would like to know if okay to use Tylenol   -wants to know if patient should take Metformin since taking Farxiga  Informed [name]:   -okay to take tylenol   -follow up with PCP regarding gout treatment   -per discharge summary continue Metformin   -Farxiga prescribed due to cardiac reasons  Relayed Dr. [name] message  [name] verbalized understanding, no questions at this time

## 2023-05-04 ENCOUNTER — Other Ambulatory Visit (HOSPITAL_COMMUNITY): Payer: Self-pay

## 2023-05-06 ENCOUNTER — Inpatient Hospital Stay (HOSPITAL_COMMUNITY): Payer: Medicare Other

## 2023-05-06 ENCOUNTER — Telehealth: Payer: Self-pay | Admitting: Internal Medicine

## 2023-05-06 ENCOUNTER — Other Ambulatory Visit (HOSPITAL_COMMUNITY): Payer: Self-pay

## 2023-05-06 ENCOUNTER — Other Ambulatory Visit: Payer: Self-pay

## 2023-05-06 DIAGNOSIS — I5033 Acute on chronic diastolic (congestive) heart failure: Secondary | ICD-10-CM

## 2023-05-06 DIAGNOSIS — I480 Paroxysmal atrial fibrillation: Secondary | ICD-10-CM

## 2023-05-06 NOTE — Telephone Encounter (Signed)
 Patient's spouse called to talk about patient's medication.

## 2023-05-06 NOTE — Telephone Encounter (Signed)
 Patient identification verified by 2 forms. Jonathan Rail, RN    Called and spoke to patients wife [name] states:   -would like to know when lasix can be resumed   -would like to know about Echo appointment  -would like to know about continuing Amiodarone   -Unsure if patient has refills   -she is not concerned about his 1lb weight gain  Informed [name]:   -lasix can be given as needed for swelling and weight pain   -Per previous RN and d/c note, Echo due in 3months   -can discuss continued use of Amiodarone at 2/24 OV   -Refill available for Rx, can request available at 2/24 OV  Brenda verbalized understanding, no questions at this time

## 2023-05-06 NOTE — Telephone Encounter (Signed)
 Spoke to patient and his wife advised he just had a echo.Discharge instructions recommend repeating Echo in 3 months.

## 2023-05-06 NOTE — Telephone Encounter (Signed)
 Pt c/o swelling/edema: STAT if pt has developed SOB within 24 hours  If swelling, where is the swelling located? no  How much weight have you gained and in what time span?   Have you gained 2 pounds in a day or 5 pounds in a week? yes  Do you have a log of your daily weights (if so, list)? 133.5, 135.6   Are you currently taking a fluid pill? no  Are you currently SOB? no  Have you traveled recently in a car or plane for an extended period of time? No Also has questions about his pulse Also has questions about meds given in the hospital

## 2023-05-06 NOTE — Telephone Encounter (Signed)
Returned call to patient's wife left message on personal voice mail to call back. 

## 2023-05-06 NOTE — Telephone Encounter (Signed)
 Wife wants a call back to discuss patient's medications.  Wife noted patient has not taken 4 medications today pending call back.

## 2023-05-07 ENCOUNTER — Encounter: Payer: Self-pay | Admitting: General Practice

## 2023-05-07 ENCOUNTER — Ambulatory Visit: Payer: Medicare Other | Attending: General Practice | Admitting: Emergency Medicine

## 2023-05-07 ENCOUNTER — Encounter (HOSPITAL_COMMUNITY)
Admission: RE | Admit: 2023-05-07 | Discharge: 2023-05-07 | Disposition: A | Discharge status: Home / Self Care | Payer: Medicare Other | Source: Ambulatory Visit | Attending: Pulmonary Disease | Admitting: Pulmonary Disease

## 2023-05-07 VITALS — BP 104/68 | HR 47 | Ht 66.0 in | Wt 142.0 lb

## 2023-05-07 DIAGNOSIS — I255 Ischemic cardiomyopathy: Secondary | ICD-10-CM

## 2023-05-07 DIAGNOSIS — R001 Bradycardia, unspecified: Secondary | ICD-10-CM

## 2023-05-07 DIAGNOSIS — I340 Nonrheumatic mitral (valve) insufficiency: Secondary | ICD-10-CM

## 2023-05-07 DIAGNOSIS — I5032 Chronic diastolic (congestive) heart failure: Secondary | ICD-10-CM | POA: Diagnosis not present

## 2023-05-07 DIAGNOSIS — I10 Essential (primary) hypertension: Secondary | ICD-10-CM

## 2023-05-07 DIAGNOSIS — I25810 Atherosclerosis of coronary artery bypass graft(s) without angina pectoris: Secondary | ICD-10-CM | POA: Diagnosis not present

## 2023-05-07 DIAGNOSIS — J449 Chronic obstructive pulmonary disease, unspecified: Secondary | ICD-10-CM | POA: Insufficient documentation

## 2023-05-07 DIAGNOSIS — I5020 Unspecified systolic (congestive) heart failure: Secondary | ICD-10-CM | POA: Diagnosis not present

## 2023-05-07 DIAGNOSIS — I480 Paroxysmal atrial fibrillation: Secondary | ICD-10-CM | POA: Diagnosis not present

## 2023-05-07 NOTE — Progress Notes (Signed)
 Cardiology Office Note:    Date:  05/07/2023  ID:  [name], DOB 05-18-1940, MRN [id number] PCP: Merri Brunette, MD  [facility] Providers Cardiologist:  Chrystie Nose, MD Electrophysiologist:  Regan Lemming, MD       Patient Profile:      [name] is a 83 y.o. male with visit-pertinent history of coronary artery disease s/p emergent CABG in 2008, proximal atrial fibrillation, SVT, hypertension, hyperlipidemia, type 2 diabetes, COPD.  Patient has history of significant bruising on anticoagulation therapy, patient and Dr. Elberta Fortis ultimately decided to switch him to aspirin.  He is felt to be a potential candidate for Watchman device or loop recorder however patient preferred conservative management.  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  He was evaluated by EP service for possible scar mediated VT.   He was admitted in December 2024 with RSV/pneumonia and acute hypoxia.  He was started on oral steroids with improvement. He was seen by Charlesetta Garibaldi, NP on 04/16/2023 for worsening shortness of breath.  He was working with pulmonary rehab however feeling of shortness of breath had worsened.  Chest x-ray showed pleural effusion.  proBNP was elevated.  He was started on Lasix.  It was noted that he was found to be in atrial fibrillation with his PCP the day before his visit with Charlesetta Garibaldi, NP.  He was restarted on Eliquis 5 mg daily.  He was experiencing increased fatigue and shortness of breath.  Cardizem was stopped and he was started on metoprolol tartrate 25 mg twice daily.  He was seen by Wynema Birch, PA on 04/22/2023 and continued to be in atrial fibrillation however now with RVR with heart rate of 116 bpm.  He was euvolemic on exam.  His metoprolol was increased to 50 mg daily.  He was to follow-up in 1 week to assess heart rate control and possibility of TEE/DCCV.  He then presented to the ED  on 04/25/2023 for shortness of breath and weakness.  EKG in the ED showed atrial fibrillation with RVR heart rate 114 with RBBB.  BNP was elevated to 1246 and chest x-ray showed mild central pulmonary vascular congestion.  TTE was ordered which showed newly reduced LVEF of 20-25%. He was seen by advanced heart failure clinic who suspected that acute drop in EF was secondary to atrial fibrillation.  He was successfully cardioverted on 04/29/2023, converting to NSR with PACs/PVCs and heart rate in the 60s. He became hypotensive during DCCV and was treated with phenylephrine.  Post cardioversion he was switched from IV to p.o. amiodarone.  He was discharged on metoprolol XL 25 mg daily, Farxiga 10 mg daily, spironolactone 12.5 mg daily, Lasix 40 mg daily, amiodarone 400 mg daily x 1 week and 2 mg thereafter, Eliquis 5 mg twice daily.       History of Present Illness:  Discussed the use of AI scribe software for clinical note transcription with the patient, who gave verbal consent to proceed.  [name] is a 83 y.o. male who comes into clinic today for acute visit with complaints of bradycardia.  The patient comes into clinic today with his wife.  He notes that starting today he has been experiencing low heart rates, fatigue, and no appetite.  Patient has been monitoring his heart rate since his discharge from the hospital on 04/30/2023 and his heart rates have been averaging in the 50s  until today.  Today his heart rate was found to be in the low 40s at home.  His blood pressure has been averaging between 100-110s.  He tells me that he surprisingly feels okay.  He has no symptoms of lightheadedness, dizziness, syncope, near syncope.  He also denies any overt heart failure symptoms denying dyspnea, chest pain, leg swelling, orthopnea, PND.     Review of Systems  Constitutional: Positive for malaise/fatigue. Negative for weight gain and weight loss.  Cardiovascular:  Negative for chest pain, claudication,  dyspnea on exertion, irregular heartbeat, leg swelling, near-syncope, orthopnea, palpitations, paroxysmal nocturnal dyspnea and syncope.  Respiratory:  Negative for cough, hemoptysis and shortness of breath.   Gastrointestinal:  Negative for abdominal pain, hematochezia and melena.  Genitourinary:  Negative for hematuria.  Neurological:  Negative for dizziness and light-headedness.     See HPI     Home Medications:    Prior to Admission medications   Medication Sig Start Date End Date Taking? Authorizing Provider  albuterol (VENTOLIN HFA) 108 (90 Base) MCG/ACT inhaler Inhale 2 puffs into the lungs every 6 (six) hours as needed for wheezing or shortness of breath. 03/11/23   Burnadette Pop, MD  amiodarone (PACERONE) 200 MG tablet Take 2 tablets (400 mg total) by mouth daily for 6 days, THEN 1 tablet (200 mg total) daily. 05/01/23 06/06/23  Parcells, Therisa Doyne, PA-C  Cholecalciferol (VITAMIN D3) 25 MCG (1000 UT) CAPS Take 1,000 Units by mouth daily.    [provider]  Cyanocobalamin (VITAMIN B-12 SL) Place 1 tablet under the tongue daily.    [provider]  dapagliflozin propanediol (FARXIGA) 10 MG TABS tablet Take 1 tablet (10 mg total) by mouth daily. 05/01/23   Parcells, Ladona Ridgel A, PA-C  ELIQUIS 5 MG TABS tablet Take 1 tablet (5 mg total) by mouth 2 (two) times daily. 04/22/23   [name], PA  folic acid (FOLVITE) 1 MG tablet Take 1 mg by mouth daily.    [provider]  furosemide (LASIX) 40 MG tablet Take 1 tablet (40 mg total) by mouth daily. 04/16/23 07/15/23  [name] D, NP  HYDROcodone bit-homatropine (HYCODAN) 5-1.5 MG/5ML syrup Take 5 mLs by mouth every 4 (four) hours as needed for cough. 03/11/23   Burnadette Pop, MD  metFORMIN (GLUCOPHAGE) 500 MG tablet Take 500 mg by mouth daily with breakfast. 02/06/23   [provider]  metoprolol succinate (TOPROL-XL) 25 MG 24 hr tablet Take 1 tablet (25 mg total) by mouth daily. 05/01/23   Parcells, Therisa Doyne,  PA-C  pravastatin (PRAVACHOL) 40 MG tablet Take 1 tablet (40 mg total) by mouth daily. 02/17/14   Hilty, Lisette Abu, MD  spironolactone (ALDACTONE) 25 MG tablet Take 0.5 tablets (12.5 mg total) by mouth daily. 05/01/23   Parcells, Therisa Doyne, PA-C  SYMBICORT 80-4.5 MCG/ACT inhaler INHALE 2 PUFFS BY MOUTH IN THE MORNING AND 2 PUFFS AT BEDTIME 11/23/22   Oretha Milch, MD   Studies Reviewed:   EKG Interpretation Date/Time:  Tuesday May 07 2023 14:26:00 EST Ventricular Rate:  47 PR Interval:  208 QRS Duration:  114 QT Interval:  478 QTC Calculation: 423 R Axis:   9  Text Interpretation: Sinus bradycardia Right bundle branch block Septal infarct , age undetermined Confirmed by Rise Paganini [phone]) on 05/07/2023 3:13:11 PM    Echo TEE 04/29/2023 1. Left ventricular ejection fraction, by estimation, is 20 to 25%. The  left ventricle has severely decreased function. The left ventricle  demonstrates global  hypokinesis. The left ventricular internal cavity size  was mildly dilated.   2. Right ventricular systolic function is moderately reduced. The right  ventricular size is moderately enlarged.   3. Left atrial size was moderately dilated. No left atrial/left atrial  appendage thrombus was detected. Smoke in LAA, but no thrombus seen   4. Right atrial size was severely dilated.   5. The mitral valve is normal in structure. Mild mitral valve  regurgitation.   6. Tricuspid valve regurgitation is mild to moderate.   7. The aortic valve is tricuspid. Aortic valve regurgitation is trivial.  No aortic stenosis is present.   Echocardiogram 04/26/2023 1. Left ventricular ejection fraction, by estimation, is 20 to 25%. The  left ventricle has severely decreased function. The left ventricle has no  regional wall motion abnormalities. Left ventricular diastolic function  could not be evaluated.   2. Right ventricular systolic function is mildly reduced. The right  ventricular size is moderately  enlarged. There is mildly elevated  pulmonary artery systolic pressure.   3. Left atrial size was moderately dilated.   4. Right atrial size was severely dilated.   5. Restricted leaflet motion, anteriorly directed eccentric jet, at least  moderate MR. . The mitral valve is normal in structure. Moderate mitral  valve regurgitation. No evidence of mitral stenosis.   6. Tricuspid valve regurgitation is moderate.   7. The aortic valve is normal in structure. There is mild calcification  of the aortic valve. There is mild thickening of the aortic valve. Aortic  valve regurgitation is not visualized. No aortic stenosis is present.   8. Pulmonic valve regurgitation is moderate.   9. The inferior vena cava is dilated in size with <50% respiratory  variability, suggesting right atrial pressure of 15 mmHg.  Risk Assessment/Calculations:    CHA2DS2-VASc Score = 6   This indicates a 9.7% annual risk of stroke. The patient's score is based upon: CHF History: 1 HTN History: 1 Diabetes History: 1 Stroke History: 0 Vascular Disease History: 1 Age Score: 2 Gender Score: 0            Physical Exam:   VS:  BP 104/68 (BP Location: Right Arm, Patient Position: Sitting, Cuff Size: Normal)   Pulse (!) 47   Ht 5\' 6"  (1.676 m)   Wt 142 lb (64.4 kg)   SpO2 98%   BMI 22.92 kg/m    Wt Readings from Last 3 Encounters:  05/07/23 142 lb (64.4 kg)  04/26/23 136 lb 12.8 oz (62.1 kg)  04/22/23 140 lb 9.6 oz (63.8 kg)    Constitutional:      Appearance: Normal and healthy appearance. Not in distress.  Neck:     Vascular: JVD normal.  Pulmonary:     Effort: Pulmonary effort is normal.     Breath sounds: Normal breath sounds.  Chest:     Chest wall: Not tender to palpatation.  Cardiovascular:     PMI at left midclavicular line. Bradycardia present. Regular rhythm. Normal S1. Normal S2.      Murmurs: There is no murmur.     No gallop.  No click. No rub.  Pulses:    Intact distal pulses.   Edema:    Peripheral edema absent.  Musculoskeletal: Normal range of motion.     Cervical back: Normal range of motion and neck supple. Skin:    General: Skin is warm and dry.  Neurological:     General: No focal deficit present.  Mental Status: Alert, oriented to person, place, and time and oriented to person, place and time.  Psychiatric:        Mood and Affect: Mood and affect normal.        Behavior: Behavior is cooperative.        Thought Content: Thought content normal.        Assessment and Plan:  Bradycardia  Patient presented to clinic for acute visit for low heart rate.  Patient found to be bradycardic with heart rate 47 bpm during OV.  Patient with c/o fatigue. He is without any lightheadedness, dizziness, syncope, near syncope, fall, dyspnea, CP. EKG today shows sinus bradycardia with heart rate of 47 with no ST or T wave changes Today I will discontinue Metoprolol XL 25 mg daily.  Patient will monitor heart rate daily and notify office for consistent heart rate less than 40.   Will have patient follow-up in 1 week with his already scheduled hospital follow-up visit to reassess heart rate. -BMET today  Persistent atrial fibrillation S/p TEE guided DCCV on 04/29/2023 EKG today shows sinus bradycardia heart rate 47 bpm He denies any symptoms of recurrent atrial fibrillation -Metoprolol XL 25 mg discontinued in the setting of bradycardia as noted above -Continue Amiodarone 200 mg daily and Eliquis 5 mg twice daily  CHA2DS2-VASc Score = 6 [CHF History: 1, HTN History: 1, Diabetes History: 1, Stroke History: 0, Vascular Disease History: 1, Age Score: 2, Gender Score: 0].  Therefore, the patient's annual risk of stroke is 9.7 %.      HFrEF / Ischemic cardiomyopathy  Echo showed LVEF 20 - 25%, no RWMA, mildly reduced RV function Presented to ED found to be in A-fib w/ RVR heart rate 114.  BNP 1246.  Chest x-ray showed mild central pulmonary vascular congestion and small  pleural effusion Seen by advanced heart failure who suspected acute exacerbation secondary to atrial fibrillation and noted if LVEF does not improve with rate and rhythm control can pursue ischemic evaluation outpatient. -Today he is euvolemic and well compensated.  NYHA class I.  He is without any overt heart failure symptoms.  He denies any leg swelling, dyspnea, orthopnea, PND.  He does not require daily loop diuretic. -Weight stable with 142 LBS today and 140 LBS on discharge -Unable to further optimize GDMT at this time given bradycardia (HR 47bpm) and low normal blood pressure.  Will discontinue Metoprolol XL 25 mg daily as stated above. -Continue GDMT Farxiga 10 mg daily, spironolactone 12.5 mg daily, Lasix 40 mg as needed -Consider adding ARB/Arni follow-up visit if blood pressure is able to tolerate -Follow-up echocardiogram scheduled for 07/23/2023  Mild MR Mild mitral valve regurgitation noted on TEE 04/29/2023 Currently asymptomatic -Continue to clinically monitor.  Consider repeating in 3 to 5 years for routine monitoring  Coronary artery disease S/p CABG in 2008 with LIMA to LAD, SVG to PDA, SVG to OM1 and OM 2 He is without any anginal symptoms, no indication for further ischemic evaluation at this time Not on ASA due to [facility] -Continue Pravastatin 40 mg daily            Dispo: Follow-up with your already scheduled visit with Irving Burton, NP on 05/13/2023  Signed, Denyce Robert, NP

## 2023-05-07 NOTE — Patient Instructions (Signed)
 Medication Instructions:  STOP METOPROLOL *If you need a refill on your cardiac medications before your next appointment, please call your pharmacy*  Lab Work: BMET TODAY If you have labs (blood work) drawn today and your tests are completely normal, you will receive your results only by:  MyChart Message (if you have MyChart) OR  A paper copy in the mail If you have any lab test that is abnormal or we need to change your treatment, we will call you to review the results.  Other Instructions MONITOR HEART-RATE CALL IF IT IS IN THE 40'S CONTACT us FOR FURTHER INSTRUCTION  Follow-Up: At [facility], you and your health needs are our priority.  As part of our continuing mission to provide you with exceptional heart care, we have created designated Provider Care Teams.  These Care Teams include your primary Cardiologist (physician) and Advanced Practice Providers (APPs -  Physician Assistants and Nurse Practitioners) who all work together to provide you with the care you need, when you need it.  Your next appointment:   KEEP SCHEDULED APPOINTMENT MONDAY  Provider:   Bernadene Person, NP

## 2023-05-08 ENCOUNTER — Encounter: Payer: Self-pay | Admitting: Cardiovascular Disease

## 2023-05-08 LAB — BASIC METABOLIC PANEL
BUN/Creatinine Ratio: 21 (ref 10–24)
BUN: 31 mg/dL — ABNORMAL HIGH (ref 8–27)
CO2: 19 mmol/L — ABNORMAL LOW (ref 20–29)
Calcium: 9 mg/dL (ref 8.6–10.2)
Chloride: 106 mmol/L (ref 96–106)
Creatinine, Ser: 1.51 mg/dL — ABNORMAL HIGH (ref 0.76–1.27)
Glucose: 137 mg/dL — ABNORMAL HIGH (ref 70–99)
Potassium: 5.5 mmol/L — ABNORMAL HIGH (ref 3.5–5.2)
Sodium: 142 mmol/L (ref 134–144)
eGFR: 45 mL/min/{1.73_m2} — ABNORMAL LOW (ref >59)

## 2023-05-08 NOTE — Progress Notes (Signed)
 Pulmonary Individual Treatment Plan  Patient Details  Name: [name] MRN: [id number] Date of Birth: 1940-11-11 Referring Provider:   Doristine Devoid Pulmonary Rehab Walk Test from 04/01/2023 in [facility]  Referring Provider Vassie Loll       Initial Encounter Date:  Flowsheet Row Pulmonary Rehab Walk Test from 04/01/2023 in [facility]  Date 04/01/23       Visit Diagnosis: Stage 2 moderate COPD by GOLD classification (HCC)  Patient's Home Medications on Admission:   Current Outpatient Medications:    albuterol (VENTOLIN HFA) 108 (90 Base) MCG/ACT inhaler, Inhale 2 puffs into the lungs every 6 (six) hours as needed for wheezing or shortness of breath., Disp: 8 g, Rfl: 2   amiodarone (PACERONE) 200 MG tablet, Take 2 tablets (400 mg total) by mouth daily for 6 days, THEN 1 tablet (200 mg total) daily., Disp: 42 tablet, Rfl: 0   Cholecalciferol (VITAMIN D3) 25 MCG (1000 UT) CAPS, Take 1,000 Units by mouth daily., Disp: , Rfl:    Cyanocobalamin (VITAMIN B-12 SL), Place 1 tablet under the tongue daily., Disp: , Rfl:    dapagliflozin propanediol (FARXIGA) 10 MG TABS tablet, Take 1 tablet (10 mg total) by mouth daily., Disp: 90 tablet, Rfl: 3   ELIQUIS 5 MG TABS tablet, Take 1 tablet (5 mg total) by mouth 2 (two) times daily., Disp: 56 tablet, Rfl: 0   folic acid (FOLVITE) 1 MG tablet, Take 1 mg by mouth daily., Disp: , Rfl:    furosemide (LASIX) 40 MG tablet, Take 1 tablet (40 mg total) by mouth daily., Disp: 90 tablet, Rfl: 3   HYDROcodone bit-homatropine (HYCODAN) 5-1.5 MG/5ML syrup, Take 5 mLs by mouth every 4 (four) hours as needed for cough., Disp: 120 mL, Rfl: 0   metFORMIN (GLUCOPHAGE) 500 MG tablet, Take 500 mg by mouth daily with breakfast., Disp: , Rfl:    metoprolol succinate (TOPROL-XL) 25 MG 24 hr tablet, Take 1 tablet (25 mg total) by mouth daily., Disp: 90  tablet, Rfl: 3   pravastatin (PRAVACHOL) 40 MG tablet, Take 1 tablet (40 mg total) by mouth daily., Disp: 90 tablet, Rfl: 3   spironolactone (ALDACTONE) 25 MG tablet, Take 0.5 tablets (12.5 mg total) by mouth daily., Disp: 45 tablet, Rfl: 3   SYMBICORT 80-4.5 MCG/ACT inhaler, INHALE 2 PUFFS BY MOUTH IN THE MORNING AND 2 PUFFS AT BEDTIME, Disp: 10.2 g, Rfl: 6  Past Medical History: 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   02/03/2016-respiratory allergy panel, environmental allergens to dust mites, cat dander, dog dander, Timothy grass, cockroach, fungus, cedar trees, ragweed, rough pigweed, mouse urine, IgE is 689 >>>Largest elevations are to cat dander, dog dander, dust mites  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)  06/27/2016-pulmonary function test-FVC 3.03 (87% predicted), postbronchodilator ratio 54, postbronc   Basal cell carcinoma 08/20/2016   left cheek-cx3 excision   BCC (basal cell carcinoma) 09/20/2016   left cheek-mohs   CAD (coronary artery disease)    cath &  CABG in 2008   Cardiomyopathy, 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   COPD (chronic obstructive pulmonary disease) (HCC)    COPD with acute exacerbation (HCC) 04/02/2018   Diabetes (HCC)    type 2   DM2 (diabetes mellitus, type 2) (HCC) 02/05/2013   Dyslipidemia    Environmental allergies 02/03/2016   02/03/2016-respiratory allergy panel, environmental allergens to dust mites, cat dander, dog dander, Timothy grass, cockroach, fungus, cedar  trees, ragweed, rough pigweed, mouse urine, IgE is 689 >>>Largest elevations are to cat dander, dog dander, dust mites    Essential hypertension 02/03/2016   Former smoker 05/14/2018   Former smoker Quit 2008 43-pack-year smoking history   History of nuclear stress test 10/08/2007   bruce myoview; normal scan with attenuation artifact; no ischemia/infarct; low risk    Hypertriglyceridemia 02/03/2016   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 Maren Beach - [place] to LAD, sequential saphenous vein graft to OM1 and OM 2, SVG to posterior descending (done emergently)     Tobacco Use: Social History   Tobacco Use  Smoking Status Former   Current packs/day: 0.00   Average packs/day: 1 pack/day for 43.1 years (43.1 ttl pk-yrs)   Types: Cigarettes   Start date: 12/25/1963   Quit date: 01/31/2007   Years since quitting: 16.2  Smokeless Tobacco Never    Labs: Review Flowsheet       Latest Ref Rng & Units 09/16/2015 03/09/2023 04/26/2023  Labs for ITP Cardiac and Pulmonary Rehab  Cholestrol 0 - 200 mg/dL - - 96   LDL (calc) 0 - 99 mg/dL - - 43   HDL-C >11 mg/dL - - 25   Trlycerides <914 mg/dL - - 782   Hemoglobin N5A 4.8 - 5.6 % - 6.4  -  TCO2 0 - 100 mmol/L 26  - -    Capillary Blood Glucose: Lab Results  Component Value Date   GLUCAP 141 (H) 04/30/2023   GLUCAP 98 04/30/2023   GLUCAP 137 (H) 04/29/2023   GLUCAP 172 (H) 04/29/2023   GLUCAP 149 (H) 04/29/2023    POCT Glucose     Row Name 04/01/23 1343             POCT Blood Glucose   Pre-Exercise 175 mg/dL                Pulmonary Assessment Scores:  Pulmonary Assessment Scores     Row Name 04/01/23 1343         ADL UCSD   ADL Phase Entry     SOB Score total 14       CAT Score   CAT Score 10       mMRC Score   mMRC Score 2             UCSD: Self-administered rating of  dyspnea associated with activities of daily living (ADLs) 6-point scale (0 = "not at all" to 5 = "maximal or unable to do because of breathlessness")  Scoring Scores range from 0 to 120.  Minimally important difference is 5 units  CAT: CAT can identify the health impairment of COPD patients and is better correlated with disease progression.  CAT has a scoring range of zero to 40. The CAT score is classified into four groups of low (less than 10), medium (10 - 20), high (21-30) and very  high (31-40) based on the impact level of disease on health status. A CAT score over 10 suggests significant symptoms.  A worsening CAT score could be explained by an exacerbation, poor medication adherence, poor inhaler technique, or progression of COPD or comorbid conditions.  CAT MCID is 2 points  mMRC: mMRC (Modified Medical Research Council) Dyspnea Scale is used to assess the degree of baseline functional disability in patients of respiratory disease due to dyspnea. No minimal important difference is established. A decrease in score of 1 point or greater is considered a positive change.   Pulmonary Function Assessment:  Pulmonary Function Assessment - 04/01/23 1322       Breath   Bilateral Breath Sounds Decreased    Shortness of Breath Yes;Limiting activity             Exercise Target Goals: Exercise Program Goal: Individual exercise prescription set using results from initial 6 min walk test and THRR while considering  patient's activity barriers and safety.   Exercise Prescription Goal: Initial exercise prescription builds to 30-45 minutes a day of aerobic activity, 2-3 days per week.  Home exercise guidelines will be given to patient during program as part of exercise prescription that the participant will acknowledge.  Activity Barriers & Risk Stratification:  Activity Barriers & Cardiac Risk Stratification - 04/01/23 1321       Activity Barriers & Cardiac Risk Stratification   Activity  Barriers Deconditioning;Muscular Weakness;Shortness of Breath;Balance Concerns;History of Falls    Cardiac Risk Stratification Moderate             6 Minute Walk:  6 Minute Walk     Row Name 04/01/23 1408         6 Minute Walk   Phase Initial     Distance 1254 feet     Walk Time 6 minutes     # of Rest Breaks 0     MPH 2.38     METS 2.46     RPE 11     Perceived Dyspnea  1     VO2 Peak 8.6     Symptoms No     Resting HR 109 bpm     Resting BP 120/64     Resting Oxygen Saturation  96 %     Exercise Oxygen Saturation  during 6 min walk 92 %     Max Ex. HR 2.4 bpm     Max Ex. BP 120/50     2 Minute Post BP 110/50       Interval HR   1 Minute HR 123     2 Minute HR 123     3 Minute HR 111     4 Minute HR 114     5 Minute HR 122     6 Minute HR 126     2 Minute Post HR 93     Interval Heart Rate? Yes       Interval Oxygen   Interval Oxygen? Yes     Baseline Oxygen Saturation % 96 %     1 Minute Oxygen Saturation % 92 %     1 Minute Liters of Oxygen 0 L     2 Minute Oxygen Saturation % 95 %     2 Minute Liters of Oxygen 0 L     3 Minute Oxygen Saturation % 95 %     3 Minute Liters of Oxygen 0 L     4 Minute Oxygen Saturation % 94 %  4 Minute Liters of Oxygen 0 L     5 Minute Oxygen Saturation % 94 %     5 Minute Liters of Oxygen 0 L     6 Minute Oxygen Saturation % 94 %     6 Minute Liters of Oxygen 0 L     2 Minute Post Oxygen Saturation % 95 %     2 Minute Post Liters of Oxygen 0 L              Oxygen Initial Assessment:  Oxygen Initial Assessment - 04/01/23 1322       Home Oxygen   Home Oxygen Device None    Home Exercise Oxygen Prescription None    Home Resting Oxygen Prescription None      Initial 6 min Walk   Oxygen Used None      Program Oxygen Prescription   Program Oxygen Prescription None      Intervention   Short Term Goals To learn and understand importance of monitoring SPO2 with pulse oximeter and demonstrate accurate use  of the pulse oximeter.;To learn and understand importance of maintaining oxygen saturations>88%;To learn and demonstrate proper pursed lip breathing techniques or other breathing techniques. ;To learn and demonstrate proper use of respiratory medications    Long  Term Goals Maintenance of O2 saturations>88%;Compliance with respiratory medication;Verbalizes importance of monitoring SPO2 with pulse oximeter and return demonstration;Exhibits proper breathing techniques, such as pursed lip breathing or other method taught during program session;Demonstrates proper use of MDI's             Oxygen Re-Evaluation:  Oxygen Re-Evaluation     Row Name 04/05/23 1459 05/03/23 1326           Program Oxygen Prescription   Program Oxygen Prescription None None        Home Oxygen   Home Oxygen Device None None      Home Exercise Oxygen Prescription None None      Home Resting Oxygen Prescription None None        Goals/Expected Outcomes   Short Term Goals To learn and understand importance of monitoring SPO2 with pulse oximeter and demonstrate accurate use of the pulse oximeter.;To learn and understand importance of maintaining oxygen saturations>88%;To learn and demonstrate proper pursed lip breathing techniques or other breathing techniques. ;To learn and demonstrate proper use of respiratory medications To learn and understand importance of monitoring SPO2 with pulse oximeter and demonstrate accurate use of the pulse oximeter.;To learn and understand importance of maintaining oxygen saturations>88%;To learn and demonstrate proper pursed lip breathing techniques or other breathing techniques. ;To learn and demonstrate proper use of respiratory medications      Long  Term Goals Maintenance of O2 saturations>88%;Compliance with respiratory medication;Verbalizes importance of monitoring SPO2 with pulse oximeter and return demonstration;Exhibits proper breathing techniques, such as pursed lip breathing or  other method taught during program session;Demonstrates proper use of MDI's Maintenance of O2 saturations>88%;Compliance with respiratory medication;Verbalizes importance of monitoring SPO2 with pulse oximeter and return demonstration;Exhibits proper breathing techniques, such as pursed lip breathing or other method taught during program session;Demonstrates proper use of MDI's      Goals/Expected Outcomes Compliance and understanding of oxygen saturation monitoring and breathing techniques to decrease shortness of breath. Compliance and understanding of oxygen saturation monitoring and breathing techniques to decrease shortness of breath.               Oxygen Discharge (Final Oxygen Re-Evaluation):  Oxygen Re-Evaluation - 05/03/23 1326  Program Oxygen Prescription   Program Oxygen Prescription None      Home Oxygen   Home Oxygen Device None    Home Exercise Oxygen Prescription None    Home Resting Oxygen Prescription None      Goals/Expected Outcomes   Short Term Goals To learn and understand importance of monitoring SPO2 with pulse oximeter and demonstrate accurate use of the pulse oximeter.;To learn and understand importance of maintaining oxygen saturations>88%;To learn and demonstrate proper pursed lip breathing techniques or other breathing techniques. ;To learn and demonstrate proper use of respiratory medications    Long  Term Goals Maintenance of O2 saturations>88%;Compliance with respiratory medication;Verbalizes importance of monitoring SPO2 with pulse oximeter and return demonstration;Exhibits proper breathing techniques, such as pursed lip breathing or other method taught during program session;Demonstrates proper use of MDI's    Goals/Expected Outcomes Compliance and understanding of oxygen saturation monitoring and breathing techniques to decrease shortness of breath.             Initial Exercise Prescription:  Initial Exercise Prescription - 04/01/23 1400        Date of Initial Exercise RX and Referring Provider   Date 04/01/23    Referring Provider Vassie Loll    Expected Discharge Date 06/27/23      Recumbant Bike   Level 2    RPM 60    Minutes 15    METs 2.2      Recumbant Elliptical   Level 2    RPM 60    Minutes 15    METs 2.2      Prescription Details   Frequency (times per week) 2    Duration Progress to 30 minutes of continuous aerobic without signs/symptoms of physical distress      Intensity   THRR 40-80% of Max Heartrate 54-109    Ratings of Perceived Exertion 11-13    Perceived Dyspnea 0-4      Progression   Progression Continue progressive overload as per policy without signs/symptoms or physical distress.      Resistance Training   Training Prescription Yes    Weight red bands    Reps 10-15             Perform Capillary Blood Glucose checks as needed.  Exercise Prescription Changes:   Exercise Prescription Changes     Row Name 04/09/23 1200 04/11/23 1205           Response to Exercise   Blood Pressure (Admit) 110/70 104/70      Blood Pressure (Exercise) 116/66 --      Blood Pressure (Exit) 112/70 100/58      Heart Rate (Admit) 95 bpm 97 bpm      Heart Rate (Exercise) 117 bpm 101 bpm      Heart Rate (Exit) 85 bpm 74 bpm      Oxygen Saturation (Admit) 98 % 97 %      Oxygen Saturation (Exercise) 97 % 97 %      Oxygen Saturation (Exit) 98 % 97 %      Rating of Perceived Exertion (Exercise) 11 12      Perceived Dyspnea (Exercise) 0 0      Duration Continue with 30 min of aerobic exercise without signs/symptoms of physical distress. Continue with 30 min of aerobic exercise without signs/symptoms of physical distress.      Intensity THRR unchanged THRR New  54-125        Progression   Progression Continue to progress workloads to maintain intensity  without signs/symptoms of physical distress. Continue to progress workloads to maintain intensity without signs/symptoms of physical distress.         Resistance Training   Training Prescription Yes Yes      Weight red bands red bands      Reps 10-15 10-15      Time 10 Minutes 10 Minutes        Recumbant Bike   Level 2 2      RPM 42 42      Watts 13 18      Minutes 15 15      METs 2 2.2        Recumbant Elliptical   Level 2 2      Minutes 15 15      METs 3.1 3               Exercise Comments:   Exercise Comments     Row Name 04/09/23 1202           Exercise Comments Peyton Najjar completed his first day of exercise. He exercised for 15 min on the recumbent elliptical and recumbent bike. He averaged 3.1 METs at level 2 on the recumbent elliptical and 2.0 METs at level 2 on the recument bike. He performed the warmup and cooldown standing with some verbal cues. Discussed METs.                Exercise Goals and Review:   Exercise Goals     Row Name 04/01/23 1321             Exercise Goals   Increase Physical Activity Yes       Intervention Provide advice, education, support and counseling about physical activity/exercise needs.;Develop an individualized exercise prescription for aerobic and resistive training based on initial evaluation findings, risk stratification, comorbidities and participant's personal goals.       Expected Outcomes Short Term: Attend rehab on a regular basis to increase amount of physical activity.;Long Term: Add in home exercise to make exercise part of routine and to increase amount of physical activity.;Long Term: Exercising regularly at least 3-5 days a week.       Increase Strength and Stamina Yes       Intervention Provide advice, education, support and counseling about physical activity/exercise needs.;Develop an individualized exercise prescription for aerobic and resistive training based on initial evaluation findings, risk stratification, comorbidities and participant's personal goals.       Expected Outcomes Short Term: Increase workloads from initial exercise prescription for  resistance, speed, and METs.;Short Term: Perform resistance training exercises routinely during rehab and add in resistance training at home;Long Term: Improve cardiorespiratory fitness, muscular endurance and strength as measured by increased METs and functional capacity ( )       Able to understand and use rate of perceived exertion (RPE) scale Yes       Intervention Provide education and explanation on how to use RPE scale       Expected Outcomes Short Term: Able to use RPE daily in rehab to express subjective intensity level;Long Term:  Able to use RPE to guide intensity level when exercising independently       Able to understand and use Dyspnea scale Yes       Intervention Provide education and explanation on how to use Dyspnea scale       Expected Outcomes Short Term: Able to use Dyspnea scale daily in rehab to express subjective sense of shortness of breath during exertion;Long  Term: Able to use Dyspnea scale to guide intensity level when exercising independently       Knowledge and understanding of Target Heart Rate Range (THRR) Yes       Intervention Provide education and explanation of THRR including how the numbers were predicted and where they are located for reference       Expected Outcomes Short Term: Able to state/look up THRR;Long Term: Able to use THRR to govern intensity when exercising independently;Short Term: Able to use daily as guideline for intensity in rehab       Understanding of Exercise Prescription Yes       Intervention Provide education, explanation, and written materials on patient's individual exercise prescription       Expected Outcomes Short Term: Able to explain program exercise prescription;Long Term: Able to explain home exercise prescription to exercise independently                Exercise Goals Re-Evaluation :  Exercise Goals Re-Evaluation     Row Name 04/05/23 1456 05/03/23 1307           Exercise Goal Re-Evaluation   Exercise Goals Review  Increase Physical Activity;Able to understand and use Dyspnea scale;Understanding of Exercise Prescription;Increase Strength and Stamina;Knowledge and understanding of Target Heart Rate Range (THRR);Able to understand and use rate of perceived exertion (RPE) scale Increase Physical Activity;Able to understand and use Dyspnea scale;Understanding of Exercise Prescription;Increase Strength and Stamina;Knowledge and understanding of Target Heart Rate Range (THRR);Able to understand and use rate of perceived exertion (RPE) scale      Comments Cylus is scheduled to begin exercise on 1/21. Will monitor and progress as able. Peyton Najjar has completed 2 exercise sessions. He exercises for 15 min on the recumbent elliptical and recumbent bike. He averages 3.0 METs at level 2 on the recumbent elliptical and 2.2 METs at level 2 on the recumbent bike. He performs the warmup and cooldown standing with some verbal cues. It is too soon to notate any discernable progressions. Peyton Najjar has missed several exercise sessions due being hospitalized for Afib. He currently on medical hold until he is approved by his cardiologist. Will continue to monitor and progress as able.      Expected Outcomes Through exercise at rehab and home, the patient will decrease shortness of breath with daily activities and feel confident in carrying out an exercise regimen at home. Through exercise at rehab and home, the patient will decrease shortness of breath with daily activities and feel confident in carrying out an exercise regimen at home.               Discharge Exercise Prescription (Final Exercise Prescription Changes):  Exercise Prescription Changes - 04/11/23 1205       Response to Exercise   Blood Pressure (Admit) 104/70    Blood Pressure (Exit) 100/58    Heart Rate (Admit) 97 bpm    Heart Rate (Exercise) 101 bpm    Heart Rate (Exit) 74 bpm    Oxygen Saturation (Admit) 97 %    Oxygen Saturation (Exercise) 97 %    Oxygen Saturation  (Exit) 97 %    Rating of Perceived Exertion (Exercise) 12    Perceived Dyspnea (Exercise) 0    Duration Continue with 30 min of aerobic exercise without signs/symptoms of physical distress.    Intensity THRR New   54-125     Progression   Progression Continue to progress workloads to maintain intensity without signs/symptoms of physical distress.  Resistance Training   Training Prescription Yes    Weight red bands    Reps 10-15    Time 10 Minutes      Recumbant Bike   Level 2    RPM 42    Watts 18    Minutes 15    METs 2.2      Recumbant Elliptical   Level 2    Minutes 15    METs 3             Nutrition:  Target Goals: Understanding of nutrition guidelines, daily intake of sodium 1500mg , cholesterol 200mg , calories 30% from fat and 7% or less from saturated fats, daily to have 5 or more servings of fruits and vegetables.  Biometrics:  Pre Biometrics - 04/01/23 1436       Pre Biometrics   Grip Strength 22 kg              Nutrition Therapy Plan and Nutrition Goals:   Nutrition Assessments:  MEDIFICTS Score Key: >=70 Need to make dietary changes  40-70 Heart Healthy Diet <= 40 Therapeutic Level Cholesterol Diet   Picture Your Plate Scores: <96 Unhealthy dietary pattern with much room for improvement. 41-50 Dietary pattern unlikely to meet recommendations for good health and room for improvement. 51-60 More healthful dietary pattern, with some room for improvement.  >60 Healthy dietary pattern, although there may be some specific behaviors that could be improved.    Nutrition Goals Re-Evaluation:   Nutrition Goals Discharge (Final Nutrition Goals Re-Evaluation):   Psychosocial: Target Goals: Acknowledge presence or absence of significant depression and/or stress, maximize coping skills, provide positive support system. Participant is able to verbalize types and ability to use techniques and skills needed for reducing stress and  depression.  Initial Review & Psychosocial Screening:  Initial Psych Review & Screening - 04/01/23 1318       Initial Review   Current issues with None Identified      Family Dynamics   Good Support System? Yes    Comments Pts wife is a former [employer] fundraiser. She is good support for pt.      Barriers   Psychosocial barriers to participate in program There are no identifiable barriers or psychosocial needs.      Screening Interventions   Interventions Encouraged to exercise    Expected Outcomes Short Term goal: Utilizing psychosocial counselor, staff and physician to assist with identification of specific Stressors or current issues interfering with healing process. Setting desired goal for each stressor or current issue identified.;Long Term Goal: Stressors or current issues are controlled or eliminated.;Short Term goal: Identification and review with participant of any Quality of Life or Depression concerns found by scoring the questionnaire.;Long Term goal: The participant improves quality of Life and PHQ9 Scores as seen by post scores and/or verbalization of changes             Quality of Life Scores:  Scores of 19 and below usually indicate a poorer quality of life in these areas.  A difference of  2-3 points is a clinically meaningful difference.  A difference of 2-3 points in the total score of the Quality of Life Index has been associated with significant improvement in overall quality of life, self-image, physical symptoms, and general health in studies assessing change in quality of life.  PHQ-9: Review Flowsheet       04/01/2023  Depression screen PHQ 2/9  Decreased Interest 0  Down, Depressed, Hopeless 0  PHQ - 2 Score 0  Altered sleeping 1  Tired, decreased energy 1  Change in appetite 0  Feeling bad or failure about yourself  0  Trouble concentrating 0  Moving slowly or fidgety/restless 0  Suicidal thoughts 0  PHQ-9 Score 2  Difficult doing work/chores Somewhat  difficult   Interpretation of Total Score  Total Score Depression Severity:  1-4 = Minimal depression, 5-9 = Mild depression, 10-14 = Moderate depression, 15-19 = Moderately severe depression, 20-27 = Severe depression   Psychosocial Evaluation and Intervention:  Psychosocial Evaluation - 04/01/23 1319       Psychosocial Evaluation & Interventions   Interventions Encouraged to exercise with the program and follow exercise prescription    Comments Neville denies any psychosocial barriers or concerns at this time.    Expected Outcomes For Saket to participate in PR free of any psychosocial barriers or conerns    Continue Psychosocial Services  No Follow up required             Psychosocial Re-Evaluation:  Psychosocial Re-Evaluation     Row Name 04/05/23 1105 05/01/23 1125           Psychosocial Re-Evaluation   Current issues with None Identified None Identified      Comments Hawke is scheduled to start the program on 04/09/23. No new psychosocial barriers or concerns since orientation. Brylee has attended 2 sessions so far. Unfortunately he was hospitalized due to Afib. Staff will follow up with Peyton Najjar as soon as he returns to class.      Expected Outcomes For Torell to participate in PR free of any psychosocial barriers or concerns For [employer] to participate in PR free of any psychosocial barriers or concerns      Interventions Encouraged to attend Pulmonary Rehabilitation for the exercise Encouraged to attend Pulmonary Rehabilitation for the exercise      Continue Psychosocial Services  No Follow up required No Follow up required               Psychosocial Discharge (Final Psychosocial Re-Evaluation):  Psychosocial Re-Evaluation - 05/01/23 1125       Psychosocial Re-Evaluation   Current issues with None Identified    Comments Dow has attended 2 sessions so far. Unfortunately he was hospitalized due to Afib. Staff will follow up with Peyton Najjar as soon as he returns to  class.    Expected Outcomes For Jhonny to participate in PR free of any psychosocial barriers or concerns    Interventions Encouraged to attend Pulmonary Rehabilitation for the exercise    Continue Psychosocial Services  No Follow up required             Education: Education Goals: Education classes will be provided on a weekly basis, covering required topics. Participant will state understanding/return demonstration of topics presented.  Learning Barriers/Preferences:  Learning Barriers/Preferences - 04/01/23 1319       Learning Barriers/Preferences   Learning Barriers Hearing    Learning Preferences Group Instruction;Individual Instruction;Written Material             Education Topics: Know Your Numbers Group instruction that is supported by a PowerPoint presentation. Instructor discusses importance of knowing and understanding resting, exercise, and post-exercise oxygen saturation, heart rate, and blood pressure. Oxygen saturation, heart rate, blood pressure, rating of perceived exertion, and dyspnea are reviewed along with a normal range for these values.    Exercise for the Pulmonary Patient Group instruction that is supported by a PowerPoint presentation. Instructor discusses benefits of exercise, core components of exercise, frequency,  duration, and intensity of an exercise routine, importance of utilizing pulse oximetry during exercise, safety while exercising, and options of places to exercise outside of rehab.    MET Level  Group instruction provided by PowerPoint, verbal discussion, and written material to support subject matter. Instructor reviews what METs are and how to increase METs.    Pulmonary Medications Verbally interactive group education provided by instructor with focus on inhaled medications and proper administration.   Anatomy and Physiology of the Respiratory System Group instruction provided by PowerPoint, verbal discussion, and written  material to support subject matter. Instructor reviews respiratory cycle and anatomical components of the respiratory system and their functions. Instructor also reviews differences in obstructive and restrictive respiratory diseases with examples of each.    Oxygen Safety Group instruction provided by PowerPoint, verbal discussion, and written material to support subject matter. There is an overview of "What is Oxygen" and "Why do we need it".  Instructor also reviews how to create a safe environment for oxygen use, the importance of using oxygen as prescribed, and the risks of noncompliance. There is a brief discussion on traveling with oxygen and resources the patient may utilize.   Oxygen Use Group instruction provided by PowerPoint, verbal discussion, and written material to discuss how supplemental oxygen is prescribed and different types of oxygen supply systems. Resources for more information are provided.    Breathing Techniques Group instruction that is supported by demonstration and informational handouts. Instructor discusses the benefits of pursed lip and diaphragmatic breathing and detailed demonstration on how to perform both.  Flowsheet Row PULMONARY REHAB CHRONIC OBSTRUCTIVE PULMONARY DISEASE from 04/11/2023 in [facility]  Date 04/11/23  Educator RN  Instruction Review Code 1- Verbalizes Understanding        Risk Factor Reduction Group instruction that is supported by a PowerPoint presentation. Instructor discusses the definition of a risk factor, different risk factors for pulmonary disease, and how the heart and lungs work together.   Pulmonary Diseases Group instruction provided by PowerPoint, verbal discussion, and written material to support subject matter. Instructor gives an overview of the different type of pulmonary diseases. There is also a discussion on risk factors and symptoms as well as ways to manage  the diseases.   Stress and Energy Conservation Group instruction provided by PowerPoint, verbal discussion, and written material to support subject matter. Instructor gives an overview of stress and the impact it can have on the body. Instructor also reviews ways to reduce stress. There is also a discussion on energy conservation and ways to conserve energy throughout the day.   Warning Signs and Symptoms Group instruction provided by PowerPoint, verbal discussion, and written material to support subject matter. Instructor reviews warning signs and symptoms of stroke, heart attack, cold and flu. Instructor also reviews ways to prevent the spread of infection.   Other Education Group or individual verbal, written, or video instructions that support the educational goals of the pulmonary rehab program.    Knowledge Questionnaire Score:  Knowledge Questionnaire Score - 04/01/23 1344       Knowledge Questionnaire Score   Pre Score 16/18             Core Components/Risk Factors/Patient Goals at Admission:  Personal Goals and Risk Factors at Admission - 04/01/23 1320       Core Components/Risk Factors/Patient Goals on Admission   Improve shortness of breath with ADL's Yes    Intervention Provide education, individualized  exercise plan and daily activity instruction to help decrease symptoms of SOB with activities of daily living.    Expected Outcomes Short Term: Improve cardiorespiratory fitness to achieve a reduction of symptoms when performing ADLs;Long Term: Be able to perform more ADLs without symptoms or delay the onset of symptoms    Increase knowledge of respiratory medications and ability to use respiratory devices properly  Yes    Expected Outcomes Short Term: Achieves understanding of medications use. Understands that oxygen is a medication prescribed by physician. Demonstrates appropriate use of inhaler and oxygen therapy.;Long Term: Maintain appropriate use of medications,  inhalers, and oxygen therapy.             Core Components/Risk Factors/Patient Goals Review:   Goals and Risk Factor Review     Row Name 04/05/23 1107 05/01/23 1126           Core Components/Risk Factors/Patient Goals Review   Personal Goals Review Improve shortness of breath with ADL's;Develop more efficient breathing techniques such as purse lipped breathing and diaphragmatic breathing and practicing self-pacing with activity.;Increase knowledge of respiratory medications and ability to use respiratory devices properly. Improve shortness of breath with ADL's;Develop more efficient breathing techniques such as purse lipped breathing and diaphragmatic breathing and practicing self-pacing with activity.;Increase knowledge of respiratory medications and ability to use respiratory devices properly.      Review We are unable to assess Byrd's goals at this time. Myquan is scheduled to start the program on 04/09/23. Peyton Najjar has attended two sessions so far. Unfortunately he was hospitalized due to Afib. Goal progressing on improving shortness of breath with ADL's. Goal progressing for developing more efficient breathing techniques such as purse lipped breathing and diaphragmatic breathing; and practicing self-pacing with activity. Goal progressing for increase knowledge of respiratory medications and ability to use respiratory devices properly. We will continue to monitor her progress throughout the program.      Expected Outcomes See admission goals To improve shortness of breath with ADL's, develop more efficient breathing techniques such as purse lipped breathing and diaphragmatic breathing; and practicing self-pacing with activity and increase knowledge of respiratory medications and ability to use respiratory devices properly.               Core Components/Risk Factors/Patient Goals at Discharge (Final Review):   Goals and Risk Factor Review - 05/01/23 1126       Core Components/Risk  Factors/Patient Goals Review   Personal Goals Review Improve shortness of breath with ADL's;Develop more efficient breathing techniques such as purse lipped breathing and diaphragmatic breathing and practicing self-pacing with activity.;Increase knowledge of respiratory medications and ability to use respiratory devices properly.    Review Peyton Najjar has attended two sessions so far. Unfortunately he was hospitalized due to Afib. Goal progressing on improving shortness of breath with ADL's. Goal progressing for developing more efficient breathing techniques such as purse lipped breathing and diaphragmatic breathing; and practicing self-pacing with activity. Goal progressing for increase knowledge of respiratory medications and ability to use respiratory devices properly. We will continue to monitor her progress throughout the program.    Expected Outcomes To improve shortness of breath with ADL's, develop more efficient breathing techniques such as purse lipped breathing and diaphragmatic breathing; and practicing self-pacing with activity and increase knowledge of respiratory medications and ability to use respiratory devices properly.             ITP Comments: Pt is making expected progress toward Pulmonary Rehab goals after completing 2 session(s). Recommend continued  exercise, life style modification, education, and utilization of breathing techniques to increase stamina and strength, while also decreasing shortness of breath with exertion.  Dr. Mechele Collin is Medical Director for Pulmonary Rehab at [facility].

## 2023-05-09 ENCOUNTER — Encounter (HOSPITAL_COMMUNITY): Payer: Medicare Other

## 2023-05-13 ENCOUNTER — Encounter: Payer: Self-pay | Admitting: Nurse Practitioner

## 2023-05-13 ENCOUNTER — Ambulatory Visit: Payer: Medicare Other | Attending: Nurse Practitioner | Admitting: Nurse Practitioner

## 2023-05-13 VITALS — BP 100/60 | HR 71 | Ht 66.0 in | Wt 138.0 lb

## 2023-05-13 DIAGNOSIS — E785 Hyperlipidemia, unspecified: Secondary | ICD-10-CM | POA: Diagnosis not present

## 2023-05-13 DIAGNOSIS — I340 Nonrheumatic mitral (valve) insufficiency: Secondary | ICD-10-CM | POA: Diagnosis not present

## 2023-05-13 DIAGNOSIS — I480 Paroxysmal atrial fibrillation: Secondary | ICD-10-CM | POA: Diagnosis not present

## 2023-05-13 DIAGNOSIS — R001 Bradycardia, unspecified: Secondary | ICD-10-CM

## 2023-05-13 DIAGNOSIS — J449 Chronic obstructive pulmonary disease, unspecified: Secondary | ICD-10-CM

## 2023-05-13 DIAGNOSIS — E119 Type 2 diabetes mellitus without complications: Secondary | ICD-10-CM

## 2023-05-13 DIAGNOSIS — I10 Essential (primary) hypertension: Secondary | ICD-10-CM

## 2023-05-13 DIAGNOSIS — I2510 Atherosclerotic heart disease of native coronary artery without angina pectoris: Secondary | ICD-10-CM

## 2023-05-13 DIAGNOSIS — I5020 Unspecified systolic (congestive) heart failure: Secondary | ICD-10-CM

## 2023-05-13 NOTE — Progress Notes (Signed)
 Office Visit    Patient Name: [name] Date of Encounter: 05/13/2023  Primary Care Provider:  Merri Brunette, MD Primary Cardiologist:  Chrystie Nose, MD  Chief Complaint    83 year old male with a history of CAD s/p CABG x4 (LIMA to LAD, sequential saphenous vein graft to OM1 and OM 2, SVG to posterior descending) in 2008, chronic systolic heart failure, paroxysmal atrial fibrillation, SVT, mitral valve regurgitation, hypertension, hyperlipidemia, type 2 diabetes, and COPD who presents for follow-up related to heart failure, atrial fibrillation and bradycardia.  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   02/03/2016-respiratory allergy panel, environmental allergens to dust mites, cat dander, dog dander, Timothy grass, cockroach, fungus, cedar trees, ragweed, rough pigweed, mouse urine, IgE is 689 >>>Largest elevations are to cat dander, dog dander, dust mites  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)  06/27/2016-pulmonary function test-FVC 3.03 (87% predicted), postbronchodilator ratio 54, postbronc   Basal cell carcinoma 08/20/2016   left cheek-cx3 excision   BCC (basal cell carcinoma) 09/20/2016   left cheek-mohs   CAD (coronary artery disease)    cath & CABG in 2008   Cardiomyopathy, 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   COPD (chronic obstructive pulmonary disease) (HCC)    COPD with acute exacerbation (HCC) 04/02/2018   Diabetes (HCC)    type 2   DM2 (diabetes mellitus,  type 2) (HCC) 02/05/2013   Dyslipidemia    Environmental allergies 02/03/2016   02/03/2016-respiratory allergy panel, environmental allergens to dust mites, cat dander, dog dander, Timothy grass, cockroach, fungus, cedar trees, ragweed, rough pigweed, mouse urine, IgE is 689 >>>Largest elevations are to cat dander, dog dander, dust mites    Essential hypertension 02/03/2016   Former smoker 05/14/2018   Former smoker Quit 2008 43-pack-year smoking history   History of nuclear stress test 10/08/2007   bruce myoview; normal scan with attenuation artifact; no ischemia/infarct; low risk    Hypertriglyceridemia 02/03/2016   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 Maren Beach - LIMA to LAD, sequential saphenous vein graft to OM1 and OM 2, SVG to posterior descending (done emergently)    Past Surgical History:  Procedure Laterality Date   CARDIAC CATHETERIZATION  2003   total occlusion of LAD with left to left collaterals, mod disease of RCA & mild disease in Cfx (Dr. Langston Reusing)   CARDIOVERSION N/A 04/29/2023   Procedure: CARDIOVERSION;  Surgeon: Little Ishikawa, MD;  Location: [facility] INVASIVE CV LAB;  Service: Cardiovascular;  Laterality: N/A;   Carotid Doppler  06/2010   stenosis of ICA less than 50%    CORONARY ARTERY BYPASS GRAFT  05/31/2006   LIMA to LAD, SVG to OM1 & OM2, SVG to PDA (Dr. Donata Clay)   NASAL SEPTOPLASTY W/ TURBINOPLASTY  1970s   PROSTATE BIOPSY     TONSILLECTOMY     TRANSESOPHAGEAL ECHOCARDIOGRAM (CATH LAB) N/A 04/29/2023   Procedure: TRANSESOPHAGEAL  ECHOCARDIOGRAM;  Surgeon: Little Ishikawa, MD;  Location: [facility] INVASIVE CV LAB;  Service: Cardiovascular;  Laterality: N/A;    Allergies  Allergies  Allergen Reactions   Other Shortness Of Breath and Other (See Comments)    Feline dander   Lisinopril Cough     Labs/Other Studies  Reviewed    The following studies were reviewed today:  Cardiac Studies & Procedures   ______________________________________________________________________________________________   STRESS TESTS  MYOCARDIAL PERFUSION IMAGING 11/25/2018  Narrative  Nuclear stress EF: 43%.  There was no ST segment deviation noted during stress.  No T wave inversion was noted during stress.  Findings consistent with prior myocardial infarction.  This is a low risk study.  The left ventricular ejection fraction is moderately decreased (30-44%).  There is a fixed perfusion defect of moderate severity present on rest and stress imaging, located in the anterior apical/apical segments.  This defect is of moderate size, comprising 5-8% of the LV.  A similar defect was reported on the prior study from 2016, and there appears to be no change.  This likely represents prior infarction.  There is no reversible ischemia on the study.  The ejection fraction is reported at 43%, which is likely the lower limits of normal for this modality of testing.  Wall motion appears normal on this study.  Overall, this is likely a low risk study with evidence of prior infarction that was seen in 2016, and no reversible ischemia.   ECHOCARDIOGRAM  ECHOCARDIOGRAM COMPLETE 04/26/2023  Narrative ECHOCARDIOGRAM REPORT    Patient Name:   [name] [facility] Date of Exam: 04/26/2023 Medical Rec #:  [id number]      Height:       66.0 in Accession #:    [id number]     Weight:       135.0 lb Date of Birth:  [birth year]      BSA:          1.692 m Patient Age:    82 years       BP:           102/80 mmHg Patient Gender: M              HR:           101 bpm. Exam Location:  Inpatient  Procedure: 2D Echo, Cardiac Doppler, Color Doppler and Intracardiac Opacification Agent  Indications:    I48.91* Unspeicified atrial fibrillation; R06.02 SOB; R60.0 Lower extremity edema  History:        Patient has prior history of Echocardiogram  examinations, most recent 03/08/2016. CHF and Cardiomyopathy, CAD, Abnormal ECG, COPD, Arrythmias:Atrial Fibrillation, Signs/Symptoms:Shortness of Breath and Dyspnea; Risk Factors:Dyslipidemia and Former Smoker.  Sonographer:    Sheralyn Boatman RDCS Referring Phys: [id number] CALLIE E GOODRICH  IMPRESSIONS   1. Left ventricular ejection fraction, by estimation, is 20 to 25%. The left ventricle has severely decreased function. The left ventricle has no regional wall motion abnormalities. Left ventricular diastolic function could not be evaluated. 2. Right ventricular systolic function is mildly reduced. The right ventricular size is moderately enlarged. There is mildly elevated pulmonary artery systolic pressure. 3. Left atrial size was moderately dilated. 4. Right atrial size was severely dilated. 5. Restricted leaflet motion, anteriorly directed eccentric jet, at least moderate MR. . The mitral valve is normal in structure. Moderate mitral valve regurgitation. No evidence of mitral stenosis. 6. Tricuspid valve regurgitation is moderate. 7. The aortic valve is normal in structure. There is mild calcification of the  aortic valve. There is mild thickening of the aortic valve. Aortic valve regurgitation is not visualized. No aortic stenosis is present. 8. Pulmonic valve regurgitation is moderate. 9. The inferior vena cava is dilated in size with <50% respiratory variability, suggesting right atrial pressure of 15 mmHg.  Comparison(s): LV function significantly less.  FINDINGS Left Ventricle: Left ventricular ejection fraction, by estimation, is 20 to 25%. The left ventricle has severely decreased function. The left ventricle has no regional wall motion abnormalities. Definity contrast agent was given IV to delineate the left ventricular endocardial borders. The left ventricular internal cavity size was normal in size. There is no left ventricular hypertrophy. Left ventricular diastolic function could  not be evaluated due to atrial fibrillation. Left ventricular diastolic function could not be evaluated.  Right Ventricle: The right ventricular size is moderately enlarged. No increase in right ventricular wall thickness. Right ventricular systolic function is mildly reduced. There is mildly elevated pulmonary artery systolic pressure. The tricuspid regurgitant velocity is 2.45 m/s, and with an assumed right atrial pressure of 15 mmHg, the estimated right ventricular systolic pressure is 39.0 mmHg.  Left Atrium: Left atrial size was moderately dilated.  Right Atrium: Right atrial size was severely dilated.  Pericardium: There is no evidence of pericardial effusion.  Mitral Valve: Restricted leaflet motion, anteriorly directed eccentric jet, at least moderate MR. The mitral valve is normal in structure. Moderately decreased mobility of the mitral valve leaflets. Moderate mitral valve regurgitation. No evidence of mitral valve stenosis.  Tricuspid Valve: The tricuspid valve is normal in structure. Tricuspid valve regurgitation is moderate . No evidence of tricuspid stenosis.  Aortic Valve: The aortic valve is normal in structure. There is mild calcification of the aortic valve. There is mild thickening of the aortic valve. Aortic valve regurgitation is not visualized. No aortic stenosis is present.  Pulmonic Valve: The pulmonic valve was normal in structure. Pulmonic valve regurgitation is moderate. No evidence of pulmonic stenosis.  Aorta: The aortic root is normal in size and structure.  Venous: The inferior vena cava is dilated in size with less than 50% respiratory variability, suggesting right atrial pressure of 15 mmHg.  IAS/Shunts: No atrial level shunt detected by color flow Doppler.   LEFT VENTRICLE PLAX 2D LVIDd:         5.10 cm LVIDs:         4.70 cm LV PW:         1.00 cm LV IVS:        1.00 cm LVOT diam:     2.20 cm LV SV:         34 LV SV Index:   20 LVOT Area:      3.80 cm  LV Volumes (MOD) LV vol d, MOD A2C: 98.3 ml LV vol d, MOD A4C: 131.0 ml LV vol s, MOD A2C: 83.4 ml LV vol s, MOD A4C: 85.1 ml LV SV MOD A2C:     14.9 ml LV SV MOD A4C:     131.0 ml LV SV MOD BP:      33.0 ml  IVC IVC diam: 2.50 cm  LEFT ATRIUM             Index        RIGHT ATRIUM           Index LA diam:        4.90 cm 2.90 cm/m   RA Area:     27.90 cm LA Vol (A2C):   34.2 ml  20.21 ml/m  RA Volume:   91.40 ml  54.01 ml/m LA Vol (A4C):   51.5 ml 30.43 ml/m LA Biplane Vol: 41.5 ml 24.52 ml/m AORTIC VALVE             PULMONIC VALVE LVOT Vmax:   66.15 cm/s  PR End Diast Vel: 2.07 msec LVOT Vmean:  44.700 cm/s LVOT VTI:    0.088 m  AORTA Ao Root diam: 3.00 cm Ao Asc diam:  3.30 cm  MITRAL VALVE                  TRICUSPID VALVE MV Area (PHT): 6.24 cm       TR Peak grad:   24.0 mmHg MV Decel Time: 122 msec       TR Vmax:        245.00 cm/s MR Peak grad:    64.0 mmHg MR Mean grad:    45.0 mmHg    SHUNTS MR Vmax:         400.00 cm/s  Systemic VTI:  0.09 m MR Vmean:        312.0 cm/s   Systemic Diam: 2.20 cm MR PISA:         0.57 cm MR PISA Eff ROA: 6 mm MR PISA Radius:  0.30 cm MV E velocity: 88.80 cm/s  Clearnce Hasten Electronically signed by Clearnce Hasten Signature Date/Time: 04/26/2023/11:51:05 AM    Final   TEE  ECHO TEE 04/29/2023  Narrative TRANSESOPHOGEAL ECHO REPORT    Patient Name:   DAKHARI ZUVER [facility] Date of Exam: 04/29/2023 Medical Rec #:  [id number]      Height:       66.0 in Accession #:    [id number]     Weight:       136.8 lb Date of Birth:  Mar 10, 1941      BSA:          1.702 m Patient Age:    82 years       BP:           107/72 mmHg Patient Gender: M              HR:           101 bpm. Exam Location:  Inpatient  Procedure: Cardiac Doppler, Color Doppler and Transesophageal Echo  Indications:     Cadrioversion  History:         Patient has prior history of Echocardiogram examinations, most recent 04/23/2023.  Sonographer:      Harriette Bouillon RDCS Referring Phys:  [id number] Little Ishikawa Diagnosing Phys: Epifanio Lesches MD  PROCEDURE: The transesophogeal probe was passed without difficulty through the esophogus of the patient. Sedation performed by different physician. The patient developed no complications during the procedure.  IMPRESSIONS   1. Left ventricular ejection fraction, by estimation, is 20 to 25%. The left ventricle has severely decreased function. The left ventricle demonstrates global hypokinesis. The left ventricular internal cavity size was mildly dilated. 2. Right ventricular systolic function is moderately reduced. The right ventricular size is moderately enlarged. 3. Left atrial size was moderately dilated. No left atrial/left atrial appendage thrombus was detected. Smoke in LAA, but no thrombus seen 4. Right atrial size was severely dilated. 5. The mitral valve is normal in structure. Mild mitral valve regurgitation. 6. Tricuspid valve regurgitation is mild to moderate. 7. The aortic valve is tricuspid. Aortic valve regurgitation is trivial. No aortic stenosis is present.  Conclusion(s)/Recommendation(s): No LA/LAA thrombus identified. Successful cardioversion  performed with restoration of normal sinus rhythm.  FINDINGS Left Ventricle: Left ventricular ejection fraction, by estimation, is 20 to 25%. The left ventricle has severely decreased function. The left ventricle demonstrates global hypokinesis. The left ventricular internal cavity size was mildly dilated.  Right Ventricle: The right ventricular size is moderately enlarged. No increase in right ventricular wall thickness. Right ventricular systolic function is moderately reduced.  Left Atrium: Left atrial size was moderately dilated. No left atrial/left atrial appendage thrombus was detected.  Right Atrium: Right atrial size was severely dilated.  Pericardium: There is no evidence of pericardial effusion.  Mitral Valve:  The mitral valve is normal in structure. Mild mitral valve regurgitation.  Tricuspid Valve: The tricuspid valve is normal in structure. Tricuspid valve regurgitation is mild to moderate.  Aortic Valve: The aortic valve is tricuspid. Aortic valve regurgitation is trivial. No aortic stenosis is present.  Pulmonic Valve: The pulmonic valve was grossly normal. Pulmonic valve regurgitation is trivial.  Aorta: The aortic root and ascending aorta are structurally normal, with no evidence of dilitation.  IAS/Shunts: No atrial level shunt detected by color flow Doppler.  Epifanio Lesches MD Electronically signed by Epifanio Lesches MD Signature Date/Time: 04/29/2023/3:16:49 PM    Final  MONITORS  LONG TERM MONITOR (3-14 DAYS) 11/13/2018  Narrative Moderate burden of PVCs and PACs, intermittent SVT and short runs of NSVT noted.  Chrystie Nose, MD, [facility], FACP   [facility] Medical Director of the Advanced Lipid Disorders & Cardiovascular Risk Reduction Clinic Diplomate of the American Board of Clinical Lipidology Attending Cardiologist Direct Dial: [phone]  Fax: [phone] Website:  [email or web address]       ______________________________________________________________________________________________     Recent Labs: 03/10/2023: ALT 33 04/25/2023: B Natriuretic Peptide 1,246.8; Magnesium 1.5 04/26/2023: TSH 2.475 04/29/2023: Hemoglobin 13.3; Platelets 224 05/07/2023: BUN 31; Creatinine, Ser 1.51; Potassium 5.5; Sodium 142  Recent Lipid Panel    Component Value Date/Time   CHOL 96 04/26/2023 0347   TRIG 138 04/26/2023 0347   HDL 25 (L) 04/26/2023 0347   CHOLHDL 3.8 04/26/2023 0347   VLDL 28 04/26/2023 0347   LDLCALC 43 04/26/2023 0347    History of Present Illness    83 year old male with the above past medical history including CAD s/p CABG x4 (LIMA to LAD, sequential saphenous vein graft to OM1 and OM 2, SVG to posterior descending) in 2008,  chronic systolic heart failure, paroxysmal atrial fibrillation, SVT, mitral valve regurgitation, hypertension, hyperlipidemia, type 2 diabetes, and COPD.  He has a history of emergent CABG in 2008 as well as a history of atrial fibrillation.  He has a history of significant bruising on anticoagulation therapy, Eliquis was previously discontinued (of note, pt declined watchman, loop recorder). Myoview in 2020 showed EF 43%, fixed defect consistent with previous MI in the anterior apical and apical segment. He was evaluated by EP service for possible scar mediated VT. He was seen in the office in January 2025 and noted worsening shortness of breath. Chest x-ray indicated pleural effusion.  BNP was elevated.  He was started on Lasix.  He had recurrent atrial fibrillation in January 2025.  Eliquis was resumed, he was transitioned from diltiazem to metoprolol tartrate.  He was hospitalized in February 2025 in the setting of A-fib with RVR. Echocardiogram showed newly reduced LVEF of 20 to 25%, severely decreased LV function, moderately reduced RV systolic function, moderately dilated left atrium, severely dilated right atrium, mild mitral valve regurgitation.  He was seen by the advanced heart  failure team who felt that his acute drop in EF was likely secondary to demand ischemia in the setting of atrial fibrillation with RVR.  He underwent successful DCCV on 04/29/2023 with restoration of sinus rhythm.  He was started on p.o. amiodarone, spironolactone, and [place].  Repeat echocardiogram was recommended in 3 months to reevaluate LVEF.  He was discharged home in stable condition on 04/30/2023.  He was last seen in office on 05/07/2023 and noted bradycardia.  HR was 47 bpm.  Metoprolol was discontinued.  He presents today for follow-up accompanied by his wife. Since last visit he done well from a cardiac standpoint. He notes mild dyspnea on exertion, he is working gradually increasing his activity as tolerated.  He  hopes to resume pulmonary rehab in the next week or two. He denies chest pain, palpitations, dizziness, edema, PND, orthopnea, weight gain.  Home Medications    Current Outpatient Medications  Medication Sig Dispense Refill   albuterol (VENTOLIN HFA) 108 (90 Base) MCG/ACT inhaler Inhale 2 puffs into the lungs every 6 (six) hours as needed for wheezing or shortness of breath. 8 g 2   amiodarone (PACERONE) 200 MG tablet Take 2 tablets (400 mg total) by mouth daily for 6 days, THEN 1 tablet (200 mg total) daily. 42 tablet 0   Cholecalciferol (VITAMIN D3) 25 MCG (1000 UT) CAPS Take 1,000 Units by mouth daily.     dapagliflozin propanediol (FARXIGA) 10 MG TABS tablet Take 1 tablet (10 mg total) by mouth daily. 90 tablet 3   ELIQUIS 5 MG TABS tablet Take 1 tablet (5 mg total) by mouth 2 (two) times daily. 56 tablet 0   metFORMIN (GLUCOPHAGE) 500 MG tablet Take 500 mg by mouth daily with breakfast.     metoprolol succinate (TOPROL-XL) 25 MG 24 hr tablet Take 1 tablet (25 mg total) by mouth daily. 90 tablet 3   pravastatin (PRAVACHOL) 40 MG tablet Take 1 tablet (40 mg total) by mouth daily. 90 tablet 3   spironolactone (ALDACTONE) 25 MG tablet Take 0.5 tablets (12.5 mg total) by mouth daily. 45 tablet 3   SYMBICORT 80-4.5 MCG/ACT inhaler INHALE 2 PUFFS BY MOUTH IN THE MORNING AND 2 PUFFS AT BEDTIME 10.2 g 6   Cyanocobalamin (VITAMIN B-12 SL) Place 1 tablet under the tongue daily. (Patient not taking: Reported on 05/13/2023)     folic acid (FOLVITE) 1 MG tablet Take 1 mg by mouth daily. (Patient not taking: Reported on 05/13/2023)     furosemide (LASIX) 40 MG tablet Take 1 tablet (40 mg total) by mouth daily. (Patient not taking: Reported on 05/13/2023) 90 tablet 3   HYDROcodone bit-homatropine (HYCODAN) 5-1.5 MG/5ML syrup Take 5 mLs by mouth every 4 (four) hours as needed for cough. (Patient not taking: Reported on 05/13/2023) 120 mL 0   No current facility-administered medications for this visit.      Review of Systems    He denies chest pain, palpitations, pnd, orthopnea, n, v, dizziness, syncope, edema, weight gain, or early satiety. All other systems reviewed and are otherwise negative except as noted above.   Physical Exam    VS:  BP 100/60   Pulse 71   Ht 5\' 6"  (1.676 m)   Wt 138 lb (62.6 kg)   SpO2 98%   BMI 22.27 kg/m   GEN: Well nourished, well developed, in no acute distress. HEENT: normal. Neck: Supple, no JVD, carotid bruits, or masses. Cardiac: RRR, no murmurs, rubs, or gallops. No clubbing, cyanosis, edema.  Radials/DP/PT 2+ and equal bilaterally.  Respiratory:  Respirations regular and unlabored, clear to auscultation bilaterally. GI: Soft, nontender, nondistended, BS + x 4. MS: no deformity or atrophy. Skin: warm and dry, no rash. Neuro:  Strength and sensation are intact. Psych: Normal affect.  Accessory Clinical Findings    ECG personally reviewed by me today - EKG Interpretation Date/Time:  Monday May 13 2023 14:40:49 EST Ventricular Rate:  64 PR Interval:  160 QRS Duration:  118 QT Interval:  488 QTC Calculation: 503 R Axis:   8  Text Interpretation: Normal sinus rhythm Right bundle branch block Anteroseptal infarct (cited on or before 07-May-2023) When compared with ECG of 07-May-2023 14:26, Nonspecific T wave abnormality, improved in Lateral leads QT has lengthened Confirmed by Bernadene Person ([id number]) on 05/13/2023 2:45:34 PM  - no acute changes.   Lab Results  Component Value Date   WBC 9.1 04/29/2023   HGB 13.3 04/29/2023   HCT 40.9 04/29/2023   MCV 89.7 04/29/2023   PLT 224 04/29/2023   Lab Results  Component Value Date   CREATININE 1.51 (H) 05/07/2023   BUN 31 (H) 05/07/2023   NA 142 05/07/2023   K 5.5 (H) 05/07/2023   CL 106 05/07/2023   CO2 19 (L) 05/07/2023   Lab Results  Component Value Date   ALT 33 03/10/2023   AST 32 03/10/2023   ALKPHOS 38 03/10/2023   BILITOT 0.6 03/10/2023   Lab Results  Component Value Date    CHOL 96 04/26/2023   HDL 25 (L) 04/26/2023   LDLCALC 43 04/26/2023   TRIG 138 04/26/2023   CHOLHDL 3.8 04/26/2023    Lab Results  Component Value Date   HGBA1C 6.4 (H) 03/09/2023    Assessment & Plan    1. Chronic systolic heart failure: Echocardiogram showed newly reduced LVEF of 20 to 25%, severely decreased LV function, moderately reduced RV systolic function, moderately dilated left atrium, severely dilated right atrium, mild mitral valve regurgitation.  Recent A-fib was felt to be contributing to reduced EF.  Euvolemic and well compensated on exam.  Will change Lasix to 40 mg daily as needed for swelling, weight gain.  Potassium was elevated on most recent labs.  Will repeat BMET today given new spironolactone.  Repeat echocardiogram scheduled for 07/2023.  If EF remains reduced, consider need for ischemic evaluation.  Continue Farxiga.  2. Paroxysmal atrial fibrillation/bradycardia: Maintaining NSR.  Metoprolol was recently discontinued in the setting of bradycardia.  HR has improved since this time.  Will plan for follow-up labs, CBC, CMET, TSH prior to return visit.  Continue amiodarone, Eliquis.  3. CAD: S/p CABG x4 (LIMA to LAD, sequential saphenous vein graft to OM1 and OM 2, SVG to posterior descending) in 2008.  He notes mild dyspnea on exertion, this has been more noticeable since he was hospitalized with RSV late last year.  Encouraged increase activity as tolerated, continue to monitor symptoms.  Consider need for ischemic evaluation pending echo results as above.  Continue pravastatin.  4. Mitral valve regurgitation: Mild on most recent echocardiogram.  Euvolemic, compensated on exam.  Repeat echo pending as above.  5. Hypertension: BP well controlled, borderline low-he is asymptomatic. Continue current antihypertensive regimen.   6. Hyperlipidemia: LDL 43 in 04/2023.  Well-controlled.  Continue pravastatin.  7. Type 2 diabetes: A1c was 6.4 in 02/2023.  Monitored and managed  per PCP.  8. COPD: Mild dyspnea on exertion as above, overall stable.  Follows with pulmonology.  He plans to  resume pulmonary rehab in the next week or two.   9. Disposition: Follow-up in 07/2023 after repeat echocardiogram.      Joylene Grapes, NP 05/13/2023, 2:45 PM

## 2023-05-13 NOTE — Patient Instructions (Signed)
 Medication Instructions:  Furosemide (Lasix) 40 mg daily as needed for weight gain of 3 lb overnight or 5 lb in 1 week.    *If you need a refill on your cardiac medications before your next appointment, please call your pharmacy*   Lab Work: BMET today.  CBC, TSH, CMET prior to next appointment.    Testing/Procedures: NONE ordered at this time of appointment   Follow-Up: At [facility], you and your health needs are our priority.  As part of our continuing mission to provide you with exceptional heart care, we have created designated Provider Care Teams.  These Care Teams include your primary Cardiologist (physician) and Advanced Practice Providers (APPs -  Physician Assistants and Nurse Practitioners) who all work together to provide you with the care you need, when you need it.  We recommend signing up for the patient portal called "MyChart".  Sign up information is provided on this After Visit Summary.  MyChart is used to connect with patients for Virtual Visits (Telemedicine).  Patients are able to view lab/test results, encounter notes, upcoming appointments, etc.  Non-urgent messages can be sent to your provider as well.   To learn more about what you can do with MyChart, go to [email or web address].    Your next appointment:    May post Echo   Provider:   Dr. [name] or any APP    Other Instructions

## 2023-05-14 ENCOUNTER — Ambulatory Visit: Payer: Medicare Other | Attending: Cardiology | Admitting: Pharmacist

## 2023-05-14 ENCOUNTER — Encounter (HOSPITAL_COMMUNITY): Admission: RE | Admit: 2023-05-14 | Payer: Medicare Other | Source: Ambulatory Visit

## 2023-05-14 ENCOUNTER — Telehealth (HOSPITAL_COMMUNITY): Payer: Self-pay

## 2023-05-14 DIAGNOSIS — I480 Paroxysmal atrial fibrillation: Secondary | ICD-10-CM

## 2023-05-14 DIAGNOSIS — I255 Ischemic cardiomyopathy: Secondary | ICD-10-CM

## 2023-05-14 DIAGNOSIS — E1100 Type 2 diabetes mellitus with hyperosmolarity without nonketotic hyperglycemic-hyperosmolar coma (NKHHC): Secondary | ICD-10-CM

## 2023-05-14 LAB — BASIC METABOLIC PANEL
BUN/Creatinine Ratio: 14 (ref 10–24)
BUN: 16 mg/dL (ref 8–27)
CO2: 20 mmol/L (ref 20–29)
Calcium: 9.4 mg/dL (ref 8.6–10.2)
Chloride: 105 mmol/L (ref 96–106)
Creatinine, Ser: 1.17 mg/dL (ref 0.76–1.27)
Glucose: 87 mg/dL (ref 70–99)
Potassium: 4.6 mmol/L (ref 3.5–5.2)
Sodium: 142 mmol/L (ref 134–144)
eGFR: 61 mL/min/{1.73_m2} (ref >59)

## 2023-05-14 NOTE — Assessment & Plan Note (Addendum)
 Discussed amiodarone use.  There really is no "clean" antiarrhythmic and with his CAD we have limited options.  Due to his age toxicity with amiodarone is less likely due to shorter term use.  Assured wife we will monitor for toxicity/side effects.  Heart rate is improved now that metoprolol has been discontinued.  Discussed the importance of trying to stay in normal sinus rhythm due to the thought that his A-fib caused his decline in ejection fraction. Advised that he does not need to monitor or stop eating vitamin K rich foods as this is just an interaction with warfarin.  I did advise against NSAIDs and aspirin due to increased risk of bleeding.  Tylenol okay for pain

## 2023-05-14 NOTE — Telephone Encounter (Signed)
 Pt wife [name] called in regards to pt returning to [facility], [facility] staff will contact them in regards to when to return.

## 2023-05-14 NOTE — Assessment & Plan Note (Signed)
 Patient on Farxiga 10 mg daily, spironolactone 12.5 mg daily and furosemide 40 mg as needed.  Blood pressure borderline low in the low 100s over 60s.  No room to add an ACE or ARB.  Patient was bradycardic on metoprolol.  No changes with GDMT at this time

## 2023-05-14 NOTE — Telephone Encounter (Signed)
 Returned call from [place]. Left a message on her voicemail.  Deliah Goody after and talked with him. I told Peyton Najjar that I Velora Heckler, NP just to make sure he is ok to return to [place]. I told Peyton Najjar that she responds fast and I expect her to give an ok before next week. Peyton Najjar plans to return 3/4. Darrelyn Hillock I would call him if I have not heard from [place]. Pt voiced understanding.

## 2023-05-14 NOTE — Assessment & Plan Note (Signed)
 Last A1c was 6.4.  This was prior to adding [place].  We discussed both the cardiovascular and kidney benefits of [place].  He is only on metformin 500 mg daily.  I do not think that both are needed but since we did not prescribe the metformin I advised that they discussed with their primary care doctor about if he still needs the metformin.

## 2023-05-14 NOTE — Progress Notes (Addendum)
 Patient ID: [name]                 DOB: [birth year]                      MRN: [id number]     HPI: [name] is a 83 y.o. male referred by Dr. [name] to pharmacy clinic for HF medication management. PMH is significant for CAD s/p CABG x4 (LIMA to LAD, sequential saphenous vein graft to OM1 and OM 2, SVG to posterior descending) in 2008, chronic systolic heart failure, paroxysmal atrial fibrillation, SVT, mitral valve regurgitation, hypertension, hyperlipidemia, type 2 diabetes, and COPD.    He was hospitalized in February 2025 in the setting of A-fib with RVR. Echocardiogram showed newly reduced LVEF of 20 to 25%, severely decreased LV function, moderately reduced RV systolic function, moderately dilated left atrium, severely dilated right atrium, mild mitral valve regurgitation.  He was seen by the advanced heart failure team who felt that his acute drop in EF was likely secondary to demand ischemia in the setting of atrial fibrillation with RVR.  He underwent successful DCCV on 04/29/2023 with restoration of sinus rhythm.  He was started on p.o. amiodarone, spironolactone, and [place].  Repeat echocardiogram was recommended in 3 months to reevaluate LVEF.  He was discharged home in stable condition on 04/30/2023.  He was seen in office on 05/07/2023 and noted bradycardia.  HR was 47 bpm.  Metoprolol was discontinued.   Patient presents today accompanied by his wife.  Wife has questions about patient's medications.  She is concerned about the long-term effects of amiodarone.  Also had questions about vitamin K and Eliquis.  Final question about whether he still needs his metformin while taking [place].   Current CHF meds: Farxiga 10mg  daily, furosemide 40mg  as needed, spironolactone 12.5mg  daily Previously tried: metoprolol (bradycardia)  Home BP readings: Low 110s over 60s heart rates improved to 60s 70s  Wt Readings from Last 3 Encounters:  05/13/23 138 lb (62.6 kg)  05/07/23 142 lb (64.4  kg)  04/26/23 136 lb 12.8 oz (62.1 kg)   BP Readings from Last 3 Encounters:  05/13/23 100/60  05/07/23 104/68  04/30/23 97/86   Pulse Readings from Last 3 Encounters:  05/13/23 71  05/07/23 (!) 47  04/30/23 70    Renal function: Estimated Creatinine Clearance: 41.6 mL/min (by C-G formula based on SCr of 1.17 mg/dL).  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   02/03/2016-respiratory allergy panel, environmental allergens to dust mites, cat dander, dog dander, Timothy grass, cockroach, fungus, cedar trees, ragweed, rough pigweed, mouse urine, IgE is 689 >>>Largest elevations are to cat dander, dog dander, dust mites  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)  06/27/2016-pulmonary function test-FVC 3.03 (87% predicted), postbronchodilator ratio 54, postbronc   Basal cell carcinoma 08/20/2016   left cheek-cx3 excision   BCC (basal cell carcinoma) 09/20/2016   left cheek-mohs   CAD (coronary artery disease)    cath & CABG in 2008   Cardiomyopathy, 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   COPD (chronic obstructive pulmonary disease) (  HCC)    COPD with acute exacerbation (HCC) 04/02/2018   Diabetes (HCC)    type 2   DM2 (diabetes mellitus, type 2) (HCC) 02/05/2013   Dyslipidemia    Environmental allergies 02/03/2016   02/03/2016-respiratory allergy panel, environmental allergens to dust mites, cat dander, dog dander, Timothy grass, cockroach, fungus, cedar trees, ragweed, rough pigweed, mouse urine, IgE is 689  >>>Largest elevations are to cat dander, dog dander, dust mites    Essential hypertension 02/03/2016   Former smoker 05/14/2018   Former smoker Quit 2008 43-pack-year smoking history   History of nuclear stress test 10/08/2007   bruce myoview; normal scan with attenuation artifact; no ischemia/infarct; low risk    Hypertriglyceridemia 02/03/2016   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 Maren Beach - LIMA to LAD, sequential saphenous vein graft to OM1 and OM 2, SVG to posterior descending (done emergently)     Current Outpatient Medications on File Prior to Visit  Medication Sig Dispense Refill   albuterol (VENTOLIN HFA) 108 (90 Base) MCG/ACT inhaler Inhale 2 puffs into the lungs every 6 (six) hours as needed for wheezing or shortness of breath. 8 g 2   amiodarone (PACERONE) 200 MG tablet Take 2 tablets (400 mg total) by mouth daily for 6 days, THEN 1 tablet (200 mg total) daily. 42 tablet 0   Cholecalciferol (VITAMIN D3) 25 MCG (1000 UT) CAPS Take 1,000 Units by mouth daily.     Cyanocobalamin (VITAMIN B-12 SL) Place 1 tablet under the tongue daily. (Patient not taking: Reported on 05/13/2023)     dapagliflozin propanediol (FARXIGA) 10 MG TABS tablet Take 1 tablet (10 mg total) by mouth daily. 90 tablet 3   ELIQUIS 5 MG TABS tablet Take 1 tablet (5 mg total) by mouth 2 (two) times daily. 56 tablet 0   folic acid (FOLVITE) 1 MG tablet Take 1 mg by mouth daily. (Patient not taking: Reported on 05/13/2023)     furosemide (LASIX) 40 MG tablet Take 1 tablet (40 mg total) by mouth daily. (Patient taking differently: Take 40 mg by mouth daily as needed for fluid or edema. 40 mg daily as needed for weight gain of 3 lb overnight or 5 lb in 1 week.) 90 tablet 3   HYDROcodone bit-homatropine (HYCODAN) 5-1.5 MG/5ML syrup Take 5 mLs by mouth every 4 (four) hours as needed for  cough. (Patient not taking: Reported on 05/13/2023) 120 mL 0   metFORMIN (GLUCOPHAGE) 500 MG tablet Take 500 mg by mouth daily with breakfast.     pravastatin (PRAVACHOL) 40 MG tablet Take 1 tablet (40 mg total) by mouth daily. 90 tablet 3   spironolactone (ALDACTONE) 25 MG tablet Take 0.5 tablets (12.5 mg total) by mouth daily. 45 tablet 3   SYMBICORT 80-4.5 MCG/ACT inhaler INHALE 2 PUFFS BY MOUTH IN THE MORNING AND 2 PUFFS AT BEDTIME 10.2 g 6   No current facility-administered medications on file prior to visit.    Allergies  Allergen Reactions   Other Shortness Of Breath and Other (See Comments)    Feline dander   Lisinopril Cough     Assessment/Plan:  Paroxysmal atrial fibrillation (HCC) Discussed amiodarone use.  There really is no "clean" antiarrhythmic and with his CAD we have limited options.  Due to his age toxicity with amiodarone is less likely due  to shorter term use.  Assured wife we will monitor for toxicity/side effects.  Heart rate is improved now that metoprolol has been discontinued.  Discussed the importance of trying to stay in normal sinus rhythm due to the thought that his A-fib caused his decline in ejection fraction. Advised that he does not need to monitor or stop eating vitamin K rich foods as this is just an interaction with warfarin.  I did advise against NSAIDs and aspirin due to increased risk of bleeding.  Tylenol okay for pain  DM2 (diabetes mellitus, type 2) (HCC) Last A1c was 6.4.  This was prior to adding [place].  We discussed both the cardiovascular and kidney benefits of [place].  He is only on metformin 500 mg daily.  I do not think that both are needed but since we did not prescribe the metformin I advised that they discussed with their primary care doctor about if he still needs the metformin.  Cardiomyopathy, ischemic Patient on Farxiga 10 mg daily, spironolactone 12.5 mg daily and furosemide 40 mg as needed.  Blood pressure borderline low in the  low 100s over 60s.  No room to add an ACE or ARB.  Patient was bradycardic on metoprolol.  No changes with GDMT at this time  Thank you   Olene Floss, Pharm.D, BCACP, CPP [facility] [address] [place], [place] [address]  Phone: [phone]; Fax: [phone]

## 2023-05-15 ENCOUNTER — Telehealth (HOSPITAL_COMMUNITY): Payer: Self-pay

## 2023-05-15 NOTE — Telephone Encounter (Signed)
 LVM for [name]. Informed him he was cleared by Bernadene Person, NP (cardiology) to return to [facility].

## 2023-05-16 ENCOUNTER — Encounter (HOSPITAL_COMMUNITY): Payer: Medicare Other

## 2023-05-16 ENCOUNTER — Telehealth: Payer: Self-pay | Admitting: Nurse Practitioner

## 2023-05-16 DIAGNOSIS — E119 Type 2 diabetes mellitus without complications: Secondary | ICD-10-CM | POA: Diagnosis not present

## 2023-05-16 DIAGNOSIS — I5022 Chronic systolic (congestive) heart failure: Secondary | ICD-10-CM | POA: Diagnosis not present

## 2023-05-16 DIAGNOSIS — I4891 Unspecified atrial fibrillation: Secondary | ICD-10-CM | POA: Diagnosis not present

## 2023-05-16 DIAGNOSIS — R9389 Abnormal findings on diagnostic imaging of other specified body structures: Secondary | ICD-10-CM | POA: Diagnosis not present

## 2023-05-16 NOTE — Telephone Encounter (Signed)
 Labs faxed to PCP. Called and made spouse aware.

## 2023-05-16 NOTE — Telephone Encounter (Signed)
 Patient's spouse is requesting to speak with a nurse about bloodwork that she said was done on 2/24. See PCP in a few min and needs bloodwork faxed over

## 2023-05-17 ENCOUNTER — Telehealth: Payer: Self-pay

## 2023-05-17 NOTE — Telephone Encounter (Signed)
 Spoke with pt. Pt was notified of lab results. Pt thinks his spouse called yesterday and isn't sure if she had a question about the results. Pt is aware that if his spouse needs to call back, she can.

## 2023-05-21 ENCOUNTER — Encounter (HOSPITAL_COMMUNITY)
Admission: RE | Admit: 2023-05-21 | Discharge: 2023-05-21 | Disposition: A | Discharge status: Home / Self Care | Payer: Medicare Other | Source: Ambulatory Visit | Attending: Pulmonary Disease | Admitting: Pulmonary Disease

## 2023-05-21 ENCOUNTER — Telehealth: Payer: Self-pay | Admitting: Cardiology

## 2023-05-21 ENCOUNTER — Telehealth (HOSPITAL_COMMUNITY): Payer: Self-pay

## 2023-05-21 VITALS — Wt 134.9 lb

## 2023-05-21 DIAGNOSIS — I4891 Unspecified atrial fibrillation: Secondary | ICD-10-CM | POA: Diagnosis not present

## 2023-05-21 DIAGNOSIS — J449 Chronic obstructive pulmonary disease, unspecified: Secondary | ICD-10-CM

## 2023-05-21 NOTE — Telephone Encounter (Signed)
 Patient c/o Palpitations:  High priority if patient c/o lightheadedness, shortness of breath, or chest pain  How long have you had palpitations/irregular HR/ Afib? Are you having the symptoms now? Started today  Are you currently experiencing lightheadedness, SOB or CP? No  Do you have a history of afib (atrial fibrillation) or irregular heart rhythm? Yes  Have you checked your BP or HR? (document readings if available): 104/60   Are you experiencing any other symptoms? Rapid pulse  Patient is scheduled to see NP Edd Fabian on 05/24/23. Patient's wife would like to know if he should wait till Friday to be seen.

## 2023-05-21 NOTE — Progress Notes (Signed)
 Pulmonary Rehab Incomplete Session Note  Patient Details  Name: [name] MRN: [id number] Date of Birth: 05-15-1940 Referring Provider:   Doristine Devoid Pulmonary Rehab Walk Test from 04/01/2023 in [facility]  Referring Provider [name] did not complete his rehab session.  On arrival VS checked, noted high HR 122. Pulse felt irregular. EKG ran and pt back in Afib/flutter. Spoke to onsite provider Bernadene Person, NP, advised he needed to be seen in the clinic ASAP. Will call to get appointment for pt. Pt asymptomatic, stated his shortness of breath is at baseline. BP 104/60, O2 97% on RA. NAD. Pt verbalizes understanding of plan.

## 2023-05-21 NOTE — Telephone Encounter (Signed)
 Spoke to patient's wife.  She wanted to know if patient should wait until Friday to be seen . She states appointment was made by a personnel at [facility]   RN  informed [name] - patient is on appropriate medication - Amiodarone and Eliquis. Unless he becomes symptomatic- like increase shortness of breath,  dizziness  fatigue - contact office or go ER for evaluation     Wife states he is not having any above symptoms  at present time , verbalized understanding.

## 2023-05-21 NOTE — Telephone Encounter (Signed)
 RN called pt and spoke to his wife [name]. Informed her of his cardiology appointment this Friday at 8:25. Verbalized understanding. Will place on medical hold from Pulm rehab until cleared from cards to return to [place].

## 2023-05-22 NOTE — Progress Notes (Signed)
 Cardiology Clinic Note   Patient Name: [name] Date of Encounter: 05/24/2023  Primary Care Provider:  Merri Brunette, MD Primary Cardiologist:  Chrystie Nose, MD  Patient Profile    [name] 83 year old male presents the clinic today for follow-up evaluation of his coronary artery disease, essential hypertension, and ischemic cardiomyopathy.  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   02/03/2016-respiratory allergy panel, environmental allergens to dust mites, cat dander, dog dander, Timothy grass, cockroach, fungus, cedar trees, ragweed, rough pigweed, mouse urine, IgE is 689 >>>Largest elevations are to cat dander, dog dander, dust mites  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)  06/27/2016-pulmonary function test-FVC 3.03 (87% predicted), postbronchodilator ratio 54, postbronc   Basal cell carcinoma 08/20/2016   left cheek-cx3 excision   BCC (basal cell carcinoma) 09/20/2016   left cheek-mohs   CAD (coronary artery disease)    cath & CABG in 2008   Cardiomyopathy, 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   COPD (chronic obstructive pulmonary disease) (HCC)    COPD with acute exacerbation (HCC) 04/02/2018   Diabetes (HCC)    type 2   DM2 (diabetes mellitus, type 2) (HCC) 02/05/2013   Dyslipidemia    Environmental allergies 02/03/2016   02/03/2016-respiratory allergy panel, environmental allergens to dust mites, cat dander, dog dander, Timothy grass, cockroach, fungus,  cedar trees, ragweed, rough pigweed, mouse urine, IgE is 689 >>>Largest elevations are to cat dander, dog dander, dust mites    Essential hypertension 02/03/2016   Former smoker 05/14/2018   Former smoker Quit 2008 43-pack-year smoking history   History of nuclear stress test 10/08/2007   bruce myoview; normal scan with attenuation artifact; no ischemia/infarct; low risk    Hypertriglyceridemia 02/03/2016   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 Maren Beach - LIMA to LAD, sequential saphenous vein graft to OM1 and OM 2, SVG to posterior descending (done emergently)    Past Surgical History:  Procedure Laterality Date   CARDIAC CATHETERIZATION  2003   total occlusion of LAD with left to left collaterals, mod disease of RCA & mild disease in Cfx (Dr. Langston Reusing)   CARDIOVERSION N/A 04/29/2023   Procedure: CARDIOVERSION;  Surgeon: Little Ishikawa, MD;  Location: [facility] INVASIVE CV LAB;  Service: Cardiovascular;  Laterality: N/A;   Carotid Doppler  06/2010   stenosis of ICA less than 50%    CORONARY ARTERY BYPASS GRAFT  05/31/2006   LIMA to LAD, SVG to OM1 & OM2, SVG to PDA (Dr. Donata Clay)   NASAL SEPTOPLASTY W/ TURBINOPLASTY  1970s   PROSTATE BIOPSY     TONSILLECTOMY     TRANSESOPHAGEAL ECHOCARDIOGRAM (CATH LAB) N/A 04/29/2023   Procedure: TRANSESOPHAGEAL ECHOCARDIOGRAM;  Surgeon: Little Ishikawa, MD;  Location: [facility] INVASIVE CV LAB;  Service: Cardiovascular;  Laterality: N/A;    Allergies  Allergies  Allergen Reactions   Other Shortness Of Breath and  Other (See Comments)    Feline dander   Lisinopril Cough    History of Present Illness    [name] has a PMH of coronary artery disease status post CABG x 4 (2008, chronic systolic CHF, paroxysmal atrial fibrillation, SVT, mitral valve regurgitation, HLD, HTN, type 2 diabetes, and COPD.   Nuclear stress test 2020 showed an EF of 43% with fixed defect consistent with previous MI and anterior apical and apical segment.  He was seen and evaluated by EP for possible scar mediated VT.  He presented to the cardiology clinic 1/25 with worsening shortness of breath.  Chest x-ray indicated pleural effusion.  His BNP was elevated.  He was started on furosemide.  He had recurrent atrial fibrillation 1/25.  His Eliquis was resumed.  He was transitioned from diltiazem to metoprolol.  He was hospitalized 2/25 with atrial fibrillation RVR.  His echocardiogram showed an LVEF of 20-25%, severely decreased LV function, moderately reduced RV systolic function, moderately dilated left atria, severely dilated right atria and mild mitral valve regurgitation.  He was seen by the advanced heart failure team.  It was felt that his decreased EF was likely secondary to demand ischemia in the setting of atrial fibrillation with RVR.  He underwent DCCV 04/29/23 and return to sinus rhythm.  Repeat echocardiogram in 3 months was recommended.  His heart rate was noted to be 47.  His metoprolol was discontinued.  He was last seen in cardiology clinic by Bernadene Person NP on 05/13/2023.  He was being seen for follow-up evaluation of his atrial fibrillation and bradycardia.  He was accompanied by his wife.  He remained stable from a cardiac standpoint.  He did note mild dyspnea on exertion.  He was gradually increasing his physical activity.  He hoped to resume pulmonary rehab.  He denied chest pain, palpitations, dizziness, lower extremity swelling, orthopnea, and weight gain.  He presents to the clinic today for follow-up evaluation and states on Tuesday he was at cardiac rehab and noted elevated heart rate.  They reported that he was in atrial fibrillation.  He notes that his heart rate has been variable since that time.  Today his pulse is 119.  He is tolerating this relatively well.  He does note some occasional shortness of  breath with climbing stairs and inclines.  With normal daily activities he has been asymptomatic.  We reviewed his recent cardioversion.  We reviewed his intolerance of beta-blocker therapy.  He reports that he only drinks about 1 bottle of water per day and drinks coffee.  I will start diltiazem, reviewed triggers for atrial fibrillation, encouraged him to increase his hydration, will refer to A-fib clinic and plan follow-up in 3-4 months..  Today he denies chest pain, shortness of breath, lower extremity edema, fatigue, palpitations, melena, hematuria, hemoptysis, diaphoresis, weakness, presyncope, syncope, orthopnea, and PND.    Home Medications    Prior to Admission medications   Medication Sig Start Date End Date Taking? Authorizing Provider  albuterol (VENTOLIN HFA) 108 (90 Base) MCG/ACT inhaler Inhale 2 puffs into the lungs every 6 (six) hours as needed for wheezing or shortness of breath. 03/11/23   Burnadette Pop, MD  amiodarone (PACERONE) 200 MG tablet Take 2 tablets (400 mg total) by mouth daily for 6 days, THEN 1 tablet (200 mg total) daily. 05/01/23 06/06/23  Parcells, Therisa Doyne, PA-C  Cholecalciferol (VITAMIN D3) 25 MCG (1000 UT) CAPS Take 1,000 Units by mouth daily.    [provider]  Cyanocobalamin (VITAMIN B-12 SL) Place 1 tablet under the tongue daily. Patient not taking: Reported on 05/13/2023    [provider]  dapagliflozin propanediol (FARXIGA) 10 MG TABS tablet Take 1 tablet (10 mg total) by mouth daily. 05/01/23   Parcells, Ladona Ridgel A, PA-C  ELIQUIS 5 MG TABS tablet Take 1 tablet (5 mg total) by mouth 2 (two) times daily. 04/22/23   [name], PA  folic acid (FOLVITE) 1 MG tablet Take 1 mg by mouth daily. Patient not taking: Reported on 05/13/2023    [provider]  furosemide (LASIX) 40 MG tablet Take 1 tablet (40 mg total) by mouth daily. Patient taking differently: Take 40 mg by mouth daily as needed for fluid or edema. 40 mg daily as needed for  weight gain of 3 lb overnight or 5 lb in 1 week. 04/16/23 07/15/23  [name] D, NP  HYDROcodone bit-homatropine (HYCODAN) 5-1.5 MG/5ML syrup Take 5 mLs by mouth every 4 (four) hours as needed for cough. Patient not taking: Reported on 05/13/2023 03/11/23   Burnadette Pop, MD  metFORMIN (GLUCOPHAGE) 500 MG tablet Take 500 mg by mouth daily with breakfast. 02/06/23   [provider]  pravastatin (PRAVACHOL) 40 MG tablet Take 1 tablet (40 mg total) by mouth daily. 02/17/14   Hilty, Lisette Abu, MD  spironolactone (ALDACTONE) 25 MG tablet Take 0.5 tablets (12.5 mg total) by mouth daily. 05/01/23   Parcells, Therisa Doyne, PA-C  SYMBICORT 80-4.5 MCG/ACT inhaler INHALE 2 PUFFS BY MOUTH IN THE MORNING AND 2 PUFFS AT BEDTIME 11/23/22   Oretha Milch, MD    Family History    Family History  Problem Relation Age of Onset   Diabetes Mother        died of CVA at 26, also HTN   Alzheimer's disease Father    CAD Father    Heart attack Father 41       also HTN   Stroke Maternal Grandmother    Lung disease Neg Hx    He indicated that the status of his mother is unknown. He indicated that the status of his father is unknown. He indicated that the status of his maternal grandmother is unknown. He indicated that the status of his neg hx is unknown.  Social History    Social History   Socioeconomic History   Marital status: Married    Spouse name: Not on file   Number of children: Not on file   Years of education: Not on file   Highest education level: Bachelor's degree (e.g., BA, AB, BS)  Occupational History   Not on file  Tobacco Use   Smoking status: Former    Current packs/day: 0.00    Average packs/day: 1 pack/day for 43.1 years (43.1 ttl pk-yrs)    Types: Cigarettes    Start date: 12/25/1963    Quit date: 01/31/2007    Years since quitting: 16.3   Smokeless tobacco: Never  Vaping Use   Vaping status: Never Used  Substance and Sexual Activity   Alcohol use: Yes    Comment: few  drinks a month   Drug use: No   Sexual activity: Not Currently    Partners: Female    Comment: married  Other Topics Concern   Not on file  Social History Narrative   Originally from [place], [place]. Moved to [place] in 1968. Has worked as a Geophysicist/field seismologist for [employer]. He did attend college in [place] and lived in [place]. He grew up  in [place]. Currently has a dog. No bird or mold exposure.    Social Drivers of Corporate investment banker Strain: Not on file  Food Insecurity: No Food Insecurity (04/26/2023)   Hunger Vital Sign    Worried About Running Out of Food in the Last Year: Never true    Ran Out of Food in the Last Year: Never true  Transportation Needs: No Transportation Needs (04/26/2023)   PRAPARE - Administrator, Civil Service (Medical): No    Lack of Transportation (Non-Medical): No  Physical Activity: Not on file  Stress: Not on file  Social Connections: Moderately Isolated (04/26/2023)   Social Connection and Isolation Panel [NHANES]    Frequency of Communication with Friends and Family: Three times a week    Frequency of Social Gatherings with Friends and Family: Once a week    Attends Religious Services: Never    Database administrator or Organizations: No    Attends Banker Meetings: Never    Marital Status: Married  Catering manager Violence: Not At Risk (04/26/2023)   Humiliation, Afraid, Rape, and Kick questionnaire    Fear of Current or Ex-Partner: No    Emotionally Abused: No    Physically Abused: No    Sexually Abused: No     Review of Systems    General:  No chills, fever, night sweats or weight changes.  Cardiovascular:  No chest pain, dyspnea on exertion, edema, orthopnea, palpitations, paroxysmal nocturnal dyspnea. Dermatological: No rash, lesions/masses Respiratory: No cough, dyspnea Urologic: No hematuria, dysuria Abdominal:   No nausea, vomiting, diarrhea, bright red blood per rectum, melena, or hematemesis Neurologic:  No visual  changes, wkns, changes in mental status. All other systems reviewed and are otherwise negative except as noted above.  Physical Exam    VS:  BP 112/80   Pulse (!) 119   Ht 5\' 6"  (1.676 m)   Wt 135 lb (61.2 kg)   SpO2 (!) 86%   BMI 21.79 kg/m  , BMI Body mass index is 21.79 kg/m. GEN: Well nourished, well developed, in no acute distress. HEENT: normal. Neck: Supple, no JVD, carotid bruits, or masses. Cardiac: Irregularly irregular, no murmurs, rubs, or gallops. No clubbing, cyanosis, edema.  Radials/DP/PT 2+ and equal bilaterally.  Respiratory:  Respirations regular and unlabored, clear to auscultation bilaterally. GI: Soft, nontender, nondistended, BS + x 4. MS: no deformity or atrophy. Skin: warm and dry, no rash. Neuro:  Strength and sensation are intact. Psych: Normal affect.  Accessory Clinical Findings    Recent Labs: 03/10/2023: ALT 33 04/25/2023: B Natriuretic Peptide 1,246.8; Magnesium 1.5 04/26/2023: TSH 2.475 04/29/2023: Hemoglobin 13.3; Platelets 224 05/13/2023: BUN 16; Creatinine, Ser 1.17; Potassium 4.6; Sodium 142   Recent Lipid Panel    Component Value Date/Time   CHOL 96 04/26/2023 0347   TRIG 138 04/26/2023 0347   HDL 25 (L) 04/26/2023 0347   CHOLHDL 3.8 04/26/2023 0347   VLDL 28 04/26/2023 0347   LDLCALC 43 04/26/2023 0347         ECG personally reviewed by me today- EKG Interpretation Date/Time:  Friday May 24 2023 08:47:54 EST Ventricular Rate:  109 PR Interval:    QRS Duration:  126 QT Interval:  376 QTC Calculation: 506 R Axis:   -7  Text Interpretation: Atrial flutter with variable A-V block Non-specific intra-ventricular conduction block Confirmed by Edd Fabian [phone]) on 05/24/2023 8:49:42 AM      Echocardiogram 04/29/2023  IMPRESSIONS  1. Left ventricular ejection fraction, by estimation, is 20 to 25%. The  left ventricle has severely decreased function. The left ventricle  demonstrates global hypokinesis. The left  ventricular internal cavity size  was mildly dilated.   2. Right ventricular systolic function is moderately reduced. The right  ventricular size is moderately enlarged.   3. Left atrial size was moderately dilated. No left atrial/left atrial  appendage thrombus was detected. Smoke in LAA, but no thrombus seen   4. Right atrial size was severely dilated.   5. The mitral valve is normal in structure. Mild mitral valve  regurgitation.   6. Tricuspid valve regurgitation is mild to moderate.   7. The aortic valve is tricuspid. Aortic valve regurgitation is trivial.  No aortic stenosis is present.   Conclusion(s)/Recommendation(s): No LA/LAA thrombus identified. Successful  cardioversion performed with restoration of normal sinus rhythm.   FINDINGS   Left Ventricle: Left ventricular ejection fraction, by estimation, is 20  to 25%. The left ventricle has severely decreased function. The left  ventricle demonstrates global hypokinesis. The left ventricular internal  cavity size was mildly dilated.   Right Ventricle: The right ventricular size is moderately enlarged. No  increase in right ventricular wall thickness. Right ventricular systolic  function is moderately reduced.   Left Atrium: Left atrial size was moderately dilated. No left atrial/left  atrial appendage thrombus was detected.   Right Atrium: Right atrial size was severely dilated.   Pericardium: There is no evidence of pericardial effusion.   Mitral Valve: The mitral valve is normal in structure. Mild mitral valve  regurgitation.   Tricuspid Valve: The tricuspid valve is normal in structure. Tricuspid  valve regurgitation is mild to moderate.   Aortic Valve: The aortic valve is tricuspid. Aortic valve regurgitation is  trivial. No aortic stenosis is present.   Pulmonic Valve: The pulmonic valve was grossly normal. Pulmonic valve  regurgitation is trivial.   Aorta: The aortic root and ascending aorta are structurally  normal, with  no evidence of dilitation.   IAS/Shunts: No atrial level shunt detected by color flow Doppler.   Epifanio Lesches MD  Electronically signed by Epifanio Lesches MD  Signature Date/Time: 04/29/2023/3:16:49 PM         Assessment & Plan   1.  HFrEF-no increased work of breathing.  Slowly increasing physical activity.  Echocardiogram showed newly reduced EF of 20-25%, mild mitral valve regurgitation, moderately dilated left atria and severely dilated right atria.  Underwent successful DCCV on 04/29/2023, now back in atrial fibrillation/flutter.  Metoprolol discontinued in the setting of bradycardia. Continue spironolactone, Farxiga Heart healthy low-sodium diet Daily weights Plan for repeat echocardiogram 5/25  Paroxysmal atrial fibrillation-heart rate today 119.  Denies episodes of accelerated or irregular heartbeat.  Compliant with apixaban. Continue amiodarone, apixaban Start diltiazem 120 mg daily Avoid triggers caffeine, chocolate, EtOH, dehydration etc. Follow-up with A-fib clinic CBC, BMP, magnesium  Coronary artery disease-denies recent anginal type symptoms.  Status post CABG x 4 in 2008. Increase physical activity as tolerated Continue pravastatin  Bradycardia-heart rate today 119.  Previously discontinued metoprolol due to bradycardia. Continue to monitor  Essential hypertension-BP today 112/80. Heart healthy low-sodium diet Maintain blood pressure log Continue spironolactone  Hyperlipidemia-LDL 43 on 04/26/23. High-fiber diet Continue pravastatin  Disposition: Follow-up with Dr. [name] or me in 3-4 months.   Thomasene Ripple. Miqueas Whilden NP-C     05/24/2023, 8:47 AM [facility] [address] Office [phone] Fax [phone]  I spent 14 minutes examining this patient, reviewing medications, and using patient centered shared decision making involving their cardiac care.   I spent  20 minutes reviewing past  medical history,  medications, and prior cardiac tests.

## 2023-05-23 ENCOUNTER — Encounter (HOSPITAL_COMMUNITY): Payer: Medicare Other

## 2023-05-24 ENCOUNTER — Encounter: Payer: Self-pay | Admitting: General Practice

## 2023-05-24 ENCOUNTER — Ambulatory Visit: Attending: General Practice | Admitting: General Practice

## 2023-05-24 VITALS — BP 112/80 | HR 119 | Ht 66.0 in | Wt 135.0 lb

## 2023-05-24 DIAGNOSIS — E785 Hyperlipidemia, unspecified: Secondary | ICD-10-CM | POA: Diagnosis not present

## 2023-05-24 DIAGNOSIS — I2510 Atherosclerotic heart disease of native coronary artery without angina pectoris: Secondary | ICD-10-CM

## 2023-05-24 DIAGNOSIS — I480 Paroxysmal atrial fibrillation: Secondary | ICD-10-CM

## 2023-05-24 DIAGNOSIS — I5020 Unspecified systolic (congestive) heart failure: Secondary | ICD-10-CM | POA: Diagnosis not present

## 2023-05-24 DIAGNOSIS — R001 Bradycardia, unspecified: Secondary | ICD-10-CM

## 2023-05-24 DIAGNOSIS — I10 Essential (primary) hypertension: Secondary | ICD-10-CM

## 2023-05-24 MED ORDER — DILTIAZEM HCL ER COATED BEADS 120 MG PO CP24: 120.0000 mg | ORAL_CAPSULE | Freq: Every day | ORAL | 4 refills | Status: DC

## 2023-05-24 NOTE — Patient Instructions (Addendum)
 Medication Instructions:  START DILTIAZEM 120MG  DAILY *If you need a refill on your cardiac medications before your next appointment, please call your pharmacy*  Lab Work: CBC, BMET AND MAG TODAY If you have labs (blood work) drawn today and your tests are completely normal, you will receive your results only by:  MyChart Message (if you have MyChart) OR  A paper copy in the mail If you have any lab test that is abnormal or we need to change your treatment, we will call you to review the results.  Other Instructions YOU ARE DUE FOR FOR AND AFIB FOLLOW UP  Follow-Up: At [facility], you and your health needs are our priority.  As part of our continuing mission to provide you with exceptional heart care, we have created designated Provider Care Teams.  These Care Teams include your primary Cardiologist (physician) and Advanced Practice Providers (APPs -  Physician Assistants and Nurse Practitioners) who all work together to provide you with the care you need, when you need it.   Your next appointment:   4 month(s)  Provider:   Chrystie Nose, MD

## 2023-05-25 LAB — BASIC METABOLIC PANEL
BUN/Creatinine Ratio: 27 — ABNORMAL HIGH (ref 10–24)
BUN: 32 mg/dL — ABNORMAL HIGH (ref 8–27)
CO2: 21 mmol/L (ref 20–29)
Calcium: 9.5 mg/dL (ref 8.6–10.2)
Chloride: 104 mmol/L (ref 96–106)
Creatinine, Ser: 1.18 mg/dL (ref 0.76–1.27)
Glucose: 92 mg/dL (ref 70–99)
Potassium: 5 mmol/L (ref 3.5–5.2)
Sodium: 140 mmol/L (ref 134–144)
eGFR: 61 mL/min/{1.73_m2} (ref >59)

## 2023-05-25 LAB — CBC
Hematocrit: 45.7 % (ref 37.5–51.0)
Hemoglobin: 14.5 g/dL (ref 13.0–17.7)
MCH: 28.1 pg (ref 26.6–33.0)
MCHC: 31.7 g/dL (ref 31.5–35.7)
MCV: 89 fL (ref 79–97)
Platelets: 228 10*3/uL (ref 150–450)
RBC: 5.16 x10E6/uL (ref 4.14–5.80)
RDW: 12.4 % (ref 11.6–15.4)
WBC: 6.6 10*3/uL (ref 3.4–10.8)

## 2023-05-25 LAB — MAGNESIUM: Magnesium: 2.1 mg/dL (ref 1.6–2.3)

## 2023-05-27 ENCOUNTER — Telehealth: Payer: Self-pay | Admitting: General Practice

## 2023-05-27 MED ORDER — AMIODARONE HCL 200 MG PO TABS: 200.0000 mg | ORAL_TABLET | Freq: Every day | ORAL | 3 refills | Status: DC

## 2023-05-27 NOTE — Telephone Encounter (Signed)
*  STAT* If patient is at the pharmacy, call can be transferred to refill team.   1. Which medications need to be refilled? (please list name of each medication and dose if known)   amiodarone (PACERONE) 200 MG tablet     4. Which pharmacy/location (including street and city if local pharmacy) is medication to be sent to? [id number] - [place], [place] - [address] AT [facility] OF [address]     5. Do they need a 30 day or 90 day supply? 90

## 2023-05-27 NOTE — Telephone Encounter (Signed)
 Pt's medication was sent to pt's pharmacy as requested. Confirmation received.

## 2023-05-28 ENCOUNTER — Encounter (HOSPITAL_COMMUNITY): Payer: Self-pay | Admitting: Internal Medicine

## 2023-05-28 ENCOUNTER — Ambulatory Visit (HOSPITAL_BASED_OUTPATIENT_CLINIC_OR_DEPARTMENT_OTHER)
Admission: RE | Admit: 2023-05-28 | Discharge: 2023-05-28 | Disposition: A | Discharge status: Home / Self Care | Source: Ambulatory Visit | Attending: Internal Medicine | Admitting: Internal Medicine

## 2023-05-28 ENCOUNTER — Telehealth (HOSPITAL_COMMUNITY): Payer: Self-pay

## 2023-05-28 ENCOUNTER — Encounter (HOSPITAL_COMMUNITY)
Admission: RE | Admit: 2023-05-28 | Discharge: 2023-05-28 | Disposition: A | Discharge status: Home / Self Care | Payer: Medicare Other | Source: Ambulatory Visit | Attending: Pulmonary Disease | Admitting: Pulmonary Disease

## 2023-05-28 VITALS — BP 110/60 | HR 104 | Ht 66.0 in | Wt 137.2 lb

## 2023-05-28 DIAGNOSIS — H0279 Other degenerative disorders of eyelid and periocular area: Secondary | ICD-10-CM | POA: Diagnosis not present

## 2023-05-28 DIAGNOSIS — N179 Acute kidney failure, unspecified: Secondary | ICD-10-CM | POA: Diagnosis not present

## 2023-05-28 DIAGNOSIS — I2510 Atherosclerotic heart disease of native coronary artery without angina pectoris: Secondary | ICD-10-CM | POA: Insufficient documentation

## 2023-05-28 DIAGNOSIS — R570 Cardiogenic shock: Secondary | ICD-10-CM | POA: Diagnosis not present

## 2023-05-28 DIAGNOSIS — I110 Hypertensive heart disease with heart failure: Secondary | ICD-10-CM | POA: Insufficient documentation

## 2023-05-28 DIAGNOSIS — H02112 Cicatricial ectropion of right lower eyelid: Secondary | ICD-10-CM | POA: Diagnosis not present

## 2023-05-28 DIAGNOSIS — Z01818 Encounter for other preprocedural examination: Secondary | ICD-10-CM | POA: Diagnosis not present

## 2023-05-28 DIAGNOSIS — Z1152 Encounter for screening for COVID-19: Secondary | ICD-10-CM | POA: Diagnosis not present

## 2023-05-28 DIAGNOSIS — I255 Ischemic cardiomyopathy: Secondary | ICD-10-CM | POA: Insufficient documentation

## 2023-05-28 DIAGNOSIS — I4891 Unspecified atrial fibrillation: Secondary | ICD-10-CM | POA: Diagnosis not present

## 2023-05-28 DIAGNOSIS — H02532 Eyelid retraction right lower eyelid: Secondary | ICD-10-CM | POA: Diagnosis not present

## 2023-05-28 DIAGNOSIS — R7989 Other specified abnormal findings of blood chemistry: Secondary | ICD-10-CM | POA: Diagnosis not present

## 2023-05-28 DIAGNOSIS — D6869 Other thrombophilia: Secondary | ICD-10-CM | POA: Diagnosis not present

## 2023-05-28 DIAGNOSIS — I509 Heart failure, unspecified: Secondary | ICD-10-CM | POA: Insufficient documentation

## 2023-05-28 DIAGNOSIS — E875 Hyperkalemia: Secondary | ICD-10-CM | POA: Diagnosis not present

## 2023-05-28 DIAGNOSIS — Z515 Encounter for palliative care: Secondary | ICD-10-CM | POA: Diagnosis not present

## 2023-05-28 DIAGNOSIS — E119 Type 2 diabetes mellitus without complications: Secondary | ICD-10-CM | POA: Diagnosis not present

## 2023-05-28 DIAGNOSIS — I4892 Unspecified atrial flutter: Secondary | ICD-10-CM | POA: Insufficient documentation

## 2023-05-28 DIAGNOSIS — I4819 Other persistent atrial fibrillation: Secondary | ICD-10-CM | POA: Insufficient documentation

## 2023-05-28 DIAGNOSIS — I5082 Biventricular heart failure: Secondary | ICD-10-CM | POA: Diagnosis not present

## 2023-05-28 DIAGNOSIS — H04123 Dry eye syndrome of bilateral lacrimal glands: Secondary | ICD-10-CM | POA: Diagnosis not present

## 2023-05-28 DIAGNOSIS — K7460 Unspecified cirrhosis of liver: Secondary | ICD-10-CM | POA: Diagnosis not present

## 2023-05-28 DIAGNOSIS — H02142 Spastic ectropion of right lower eyelid: Secondary | ICD-10-CM | POA: Diagnosis not present

## 2023-05-28 DIAGNOSIS — Z7984 Long term (current) use of oral hypoglycemic drugs: Secondary | ICD-10-CM | POA: Insufficient documentation

## 2023-05-28 DIAGNOSIS — J4489 Other specified chronic obstructive pulmonary disease: Secondary | ICD-10-CM | POA: Diagnosis not present

## 2023-05-28 DIAGNOSIS — N1831 Chronic kidney disease, stage 3a: Secondary | ICD-10-CM | POA: Diagnosis not present

## 2023-05-28 DIAGNOSIS — H04521 Eversion of right lacrimal punctum: Secondary | ICD-10-CM | POA: Diagnosis not present

## 2023-05-28 DIAGNOSIS — R001 Bradycardia, unspecified: Secondary | ICD-10-CM | POA: Diagnosis not present

## 2023-05-28 DIAGNOSIS — H16211 Exposure keratoconjunctivitis, right eye: Secondary | ICD-10-CM | POA: Diagnosis not present

## 2023-05-28 DIAGNOSIS — Z7189 Other specified counseling: Secondary | ICD-10-CM | POA: Diagnosis not present

## 2023-05-28 DIAGNOSIS — R918 Other nonspecific abnormal finding of lung field: Secondary | ICD-10-CM | POA: Diagnosis not present

## 2023-05-28 DIAGNOSIS — I5084 End stage heart failure: Secondary | ICD-10-CM | POA: Diagnosis not present

## 2023-05-28 DIAGNOSIS — I5020 Unspecified systolic (congestive) heart failure: Secondary | ICD-10-CM | POA: Diagnosis not present

## 2023-05-28 DIAGNOSIS — Z7901 Long term (current) use of anticoagulants: Secondary | ICD-10-CM | POA: Insufficient documentation

## 2023-05-28 DIAGNOSIS — R6510 Systemic inflammatory response syndrome (SIRS) of non-infectious origin without acute organ dysfunction: Secondary | ICD-10-CM | POA: Diagnosis not present

## 2023-05-28 DIAGNOSIS — E1165 Type 2 diabetes mellitus with hyperglycemia: Secondary | ICD-10-CM | POA: Diagnosis not present

## 2023-05-28 DIAGNOSIS — Z79899 Other long term (current) drug therapy: Secondary | ICD-10-CM | POA: Insufficient documentation

## 2023-05-28 DIAGNOSIS — I4510 Unspecified right bundle-branch block: Secondary | ICD-10-CM | POA: Diagnosis not present

## 2023-05-28 DIAGNOSIS — H02132 Senile ectropion of right lower eyelid: Secondary | ICD-10-CM | POA: Diagnosis not present

## 2023-05-28 DIAGNOSIS — E43 Unspecified severe protein-calorie malnutrition: Secondary | ICD-10-CM | POA: Diagnosis not present

## 2023-05-28 DIAGNOSIS — R0602 Shortness of breath: Secondary | ICD-10-CM | POA: Diagnosis not present

## 2023-05-28 DIAGNOSIS — Z951 Presence of aortocoronary bypass graft: Secondary | ICD-10-CM | POA: Diagnosis not present

## 2023-05-28 DIAGNOSIS — N183 Chronic kidney disease, stage 3 unspecified: Secondary | ICD-10-CM | POA: Diagnosis not present

## 2023-05-28 DIAGNOSIS — H0011 Chalazion right upper eyelid: Secondary | ICD-10-CM | POA: Diagnosis not present

## 2023-05-28 DIAGNOSIS — E781 Pure hyperglyceridemia: Secondary | ICD-10-CM | POA: Diagnosis not present

## 2023-05-28 DIAGNOSIS — I5023 Acute on chronic systolic (congestive) heart failure: Secondary | ICD-10-CM | POA: Diagnosis not present

## 2023-05-28 DIAGNOSIS — K7200 Acute and subacute hepatic failure without coma: Secondary | ICD-10-CM | POA: Diagnosis not present

## 2023-05-28 DIAGNOSIS — Z66 Do not resuscitate: Secondary | ICD-10-CM | POA: Diagnosis not present

## 2023-05-28 DIAGNOSIS — E871 Hypo-osmolality and hyponatremia: Secondary | ICD-10-CM | POA: Diagnosis not present

## 2023-05-28 DIAGNOSIS — I130 Hypertensive heart and chronic kidney disease with heart failure and stage 1 through stage 4 chronic kidney disease, or unspecified chronic kidney disease: Secondary | ICD-10-CM | POA: Diagnosis not present

## 2023-05-28 DIAGNOSIS — E1122 Type 2 diabetes mellitus with diabetic chronic kidney disease: Secondary | ICD-10-CM | POA: Diagnosis not present

## 2023-05-28 DIAGNOSIS — N189 Chronic kidney disease, unspecified: Secondary | ICD-10-CM | POA: Diagnosis not present

## 2023-05-28 MED ORDER — AMIODARONE HCL 200 MG PO TABS: ORAL_TABLET | ORAL | 3 refills | Status: DC

## 2023-05-28 NOTE — Progress Notes (Signed)
 Primary Care Physician: Merri Brunette, MD Primary Cardiologist: Chrystie Nose, MD Electrophysiologist: Will Jorja Loa, MD     Referring Physician: Edd Fabian, NP     [name] is a 83 y.o. male with a history of CAD, HTN, T2DM, COPD, chronic systolic CHF, ischemic cardiomyopathy, and atrial fibrillation/flutter who presents for consultation in the [facility]. Recent hospital admission 2/6-11/25 for Afib s/p successful DCCV on 04/29/23 on amiodarone. Noted to be in atrial flutter in serial cardiology visits on 3/4 and 3/7; noted he was on metoprolol which was discontinued due to bradycardia. Patient was started on diltiazem on 3/7 by Cardiology. Patient is on Eliquis 5 mg BID for a CHADS2VASC score of 6.  On evaluation today, he is currently in atrial flutter. Wife notes he is SOB at times with walking and sometimes tired. He was told at pulmonary rehab he was in Afib and they stopped his session. No missed doses of Eliquis 5 mg BID or amiodarone 200 mg daily.   Today, he denies symptoms of palpitations, chest pain, orthopnea, PND, lower extremity edema, dizziness, presyncope, syncope, snoring, daytime somnolence, bleeding, or neurologic sequela. The patient is tolerating medications without difficulties and is otherwise without complaint today.   he has a BMI of Body mass index is 22.14 kg/m.[name] Filed Weights   05/28/23 0858  Weight: 62.2 kg    Current Outpatient Medications  Medication Sig Dispense Refill   albuterol (VENTOLIN HFA) 108 (90 Base) MCG/ACT inhaler Inhale 2 puffs into the lungs every 6 (six) hours as needed for wheezing or shortness of breath. 8 g 2   Cholecalciferol (VITAMIN D3) 25 MCG (1000 UT) CAPS Take 1,000 Units by mouth daily.     Cyanocobalamin (VITAMIN B-12 SL) Place 1 tablet under the tongue daily.     dapagliflozin propanediol (FARXIGA) 10 MG TABS tablet Take 1 tablet (10 mg total) by mouth daily. 90 tablet 3   ELIQUIS  5 MG TABS tablet Take 1 tablet (5 mg total) by mouth 2 (two) times daily. 56 tablet 0   folic acid (FOLVITE) 1 MG tablet Take 1 mg by mouth daily.     HYDROcodone bit-homatropine (HYCODAN) 5-1.5 MG/5ML syrup Take 5 mLs by mouth every 4 (four) hours as needed for cough. 120 mL 0   metFORMIN (GLUCOPHAGE) 500 MG tablet Take 500 mg by mouth daily with breakfast.     pravastatin (PRAVACHOL) 40 MG tablet Take 1 tablet (40 mg total) by mouth daily. 90 tablet 3   spironolactone (ALDACTONE) 25 MG tablet Take 0.5 tablets (12.5 mg total) by mouth daily. 45 tablet 3   SYMBICORT 80-4.5 MCG/ACT inhaler INHALE 2 PUFFS BY MOUTH IN THE MORNING AND 2 PUFFS AT BEDTIME 10.2 g 6   amiodarone (PACERONE) 200 MG tablet Take 1 tablet (200 mg total) by mouth 2 (two) times daily for 30 days, THEN 1 tablet (200 mg total) daily. 90 tablet 3   furosemide (LASIX) 40 MG tablet Take 1 tablet (40 mg total) by mouth daily. (Patient not taking: Reported on 05/28/2023) 90 tablet 3   No current facility-administered medications for this encounter.    Atrial Fibrillation Management history:  Previous antiarrhythmic drugs: amiodarone Previous cardioversions: 04/29/23 Previous ablations: none Anticoagulation history: Eliquis 5 mg BID   ROS- All systems are reviewed and negative except as per the HPI above.  Physical Exam: BP 110/60   Pulse (!) 104   Ht 5\' 6"  (1.676 m)   Wt 62.2  kg   BMI 22.14 kg/m   GEN: Well nourished, well developed in no acute distress NECK: No JVD; No carotid bruits CARDIAC: Irregularly irregular rate and rhythm, no murmurs, rubs, gallops RESPIRATORY:  Clear to auscultation without rales, wheezing or rhonchi  ABDOMEN: Soft, non-tender, non-distended EXTREMITIES:  No edema; No deformity   EKG today demonstrates  Vent. rate 104 BPM PR interval * ms QRS duration 120 ms QT/QTcB 412/541 ms P-R-T axes * 31 103 Atrial flutter with variable A-V block with premature ventricular or aberrantly conducted  complexes Low voltage QRS Right bundle branch block Septal infarct , age undetermined Abnormal ECG When compared with ECG of 24-May-2023 08:47, PREVIOUS ECG IS PRESENT  Echo 04/26/23 demonstrated  1. Left ventricular ejection fraction, by estimation, is 20 to 25%. The  left ventricle has severely decreased function. The left ventricle has no  regional wall motion abnormalities. Left ventricular diastolic function  could not be evaluated.   2. Right ventricular systolic function is mildly reduced. The right  ventricular size is moderately enlarged. There is mildly elevated  pulmonary artery systolic pressure.   3. Left atrial size was moderately dilated.   4. Right atrial size was severely dilated.   5. Restricted leaflet motion, anteriorly directed eccentric jet, at least  moderate MR. . The mitral valve is normal in structure. Moderate mitral  valve regurgitation. No evidence of mitral stenosis.   6. Tricuspid valve regurgitation is moderate.   7. The aortic valve is normal in structure. There is mild calcification  of the aortic valve. There is mild thickening of the aortic valve. Aortic  valve regurgitation is not visualized. No aortic stenosis is present.   8. Pulmonic valve regurgitation is moderate.   9. The inferior vena cava is dilated in size with <50% respiratory  variability, suggesting right atrial pressure of 15 mmHg.   ASSESSMENT & PLAN CHA2DS2-VASc Score = 6  The patient's score is based upon: CHF History: 1 HTN History: 1 Diabetes History: 1 Stroke History: 0 Vascular Disease History: 1 Age Score: 2 Gender Score: 0       ASSESSMENT AND PLAN: Persistent Atrial Fibrillation (ICD10:  I48.19) / atrial flutter The patient's CHA2DS2-VASc score is 6, indicating a 9.7% annual risk of stroke.    He is currently in atrial flutter. Will reload amiodarone 200 mg BID x 4 weeks. Will have patient follow up in 3 weeks to reassess if needs repeat cardioversion. Stop  diltiazem at this time; recent echo showed reduced EF. Wife will contact clinic next couple of days with HR readings.  Secondary Hypercoagulable State (ICD10:  D68.69) The patient is at significant risk for stroke/thromboembolism based upon his CHA2DS2-VASc Score of 6.  Continue Apixaban (Eliquis).  Continue without interruption.    Follow up 3 weeks Afib clinic.    Lake Bells, PA-C  [facility] [address] [place], [place] [address] [phone]

## 2023-05-28 NOTE — Patient Instructions (Signed)
 Increase amiodarone to 200mg  twice a day for 1 month then reduce to once a day   Stop cardizem (diltiazem)

## 2023-05-28 NOTE — Telephone Encounter (Signed)
 Called [name] to discuss his f/u cardiology/ Afib office visit. His spouse [name]) answered the phone. Cardiology has asked [name] to stay out of Pulmonary Rehab. I discussed with [name] being on medical hold or being discharged from the program. [name] stated that [name] should be discharged. I told [name] they can ask Dr. Vassie Loll for a new PR referral at [facility] next Pulmonary office visit. [name] voiced understanding.

## 2023-05-29 DIAGNOSIS — H04521 Eversion of right lacrimal punctum: Secondary | ICD-10-CM | POA: Diagnosis not present

## 2023-05-29 DIAGNOSIS — H02532 Eyelid retraction right lower eyelid: Secondary | ICD-10-CM | POA: Diagnosis not present

## 2023-05-29 DIAGNOSIS — H0011 Chalazion right upper eyelid: Secondary | ICD-10-CM | POA: Diagnosis not present

## 2023-05-29 DIAGNOSIS — E1165 Type 2 diabetes mellitus with hyperglycemia: Secondary | ICD-10-CM | POA: Diagnosis not present

## 2023-05-29 DIAGNOSIS — H0279 Other degenerative disorders of eyelid and periocular area: Secondary | ICD-10-CM | POA: Diagnosis not present

## 2023-05-29 DIAGNOSIS — H02142 Spastic ectropion of right lower eyelid: Secondary | ICD-10-CM | POA: Diagnosis not present

## 2023-05-29 DIAGNOSIS — H04123 Dry eye syndrome of bilateral lacrimal glands: Secondary | ICD-10-CM | POA: Diagnosis not present

## 2023-05-29 DIAGNOSIS — H02132 Senile ectropion of right lower eyelid: Secondary | ICD-10-CM | POA: Diagnosis not present

## 2023-05-29 DIAGNOSIS — Z01818 Encounter for other preprocedural examination: Secondary | ICD-10-CM | POA: Diagnosis not present

## 2023-05-29 DIAGNOSIS — H02112 Cicatricial ectropion of right lower eyelid: Secondary | ICD-10-CM | POA: Diagnosis not present

## 2023-05-29 DIAGNOSIS — H16211 Exposure keratoconjunctivitis, right eye: Secondary | ICD-10-CM | POA: Diagnosis not present

## 2023-05-30 ENCOUNTER — Encounter (HOSPITAL_BASED_OUTPATIENT_CLINIC_OR_DEPARTMENT_OTHER): Payer: Self-pay | Admitting: *Deleted

## 2023-05-30 ENCOUNTER — Other Ambulatory Visit: Payer: Self-pay

## 2023-05-30 ENCOUNTER — Encounter (HOSPITAL_COMMUNITY): Payer: Medicare Other

## 2023-05-30 ENCOUNTER — Emergency Department (HOSPITAL_BASED_OUTPATIENT_CLINIC_OR_DEPARTMENT_OTHER)

## 2023-05-30 ENCOUNTER — Inpatient Hospital Stay (HOSPITAL_BASED_OUTPATIENT_CLINIC_OR_DEPARTMENT_OTHER)
Admission: EM | Admit: 2023-05-30 | Discharge: 2023-06-18 | DRG: 291 | Disposition: E | Attending: Cardiology | Admitting: Cardiology

## 2023-05-30 DIAGNOSIS — R918 Other nonspecific abnormal finding of lung field: Secondary | ICD-10-CM | POA: Diagnosis not present

## 2023-05-30 DIAGNOSIS — I4819 Other persistent atrial fibrillation: Secondary | ICD-10-CM | POA: Diagnosis present

## 2023-05-30 DIAGNOSIS — I5020 Unspecified systolic (congestive) heart failure: Secondary | ICD-10-CM | POA: Diagnosis present

## 2023-05-30 DIAGNOSIS — Z823 Family history of stroke: Secondary | ICD-10-CM

## 2023-05-30 DIAGNOSIS — Z8249 Family history of ischemic heart disease and other diseases of the circulatory system: Secondary | ICD-10-CM

## 2023-05-30 DIAGNOSIS — H9113 Presbycusis, bilateral: Secondary | ICD-10-CM | POA: Diagnosis present

## 2023-05-30 DIAGNOSIS — E43 Unspecified severe protein-calorie malnutrition: Secondary | ICD-10-CM | POA: Diagnosis present

## 2023-05-30 DIAGNOSIS — E119 Type 2 diabetes mellitus without complications: Secondary | ICD-10-CM

## 2023-05-30 DIAGNOSIS — Z7984 Long term (current) use of oral hypoglycemic drugs: Secondary | ICD-10-CM

## 2023-05-30 DIAGNOSIS — R7989 Other specified abnormal findings of blood chemistry: Secondary | ICD-10-CM | POA: Diagnosis present

## 2023-05-30 DIAGNOSIS — E1122 Type 2 diabetes mellitus with diabetic chronic kidney disease: Secondary | ICD-10-CM | POA: Diagnosis present

## 2023-05-30 DIAGNOSIS — R54 Age-related physical debility: Secondary | ICD-10-CM | POA: Diagnosis present

## 2023-05-30 DIAGNOSIS — Z951 Presence of aortocoronary bypass graft: Secondary | ICD-10-CM

## 2023-05-30 DIAGNOSIS — N179 Acute kidney failure, unspecified: Secondary | ICD-10-CM | POA: Diagnosis present

## 2023-05-30 DIAGNOSIS — F1090 Alcohol use, unspecified, uncomplicated: Secondary | ICD-10-CM | POA: Diagnosis present

## 2023-05-30 DIAGNOSIS — I4891 Unspecified atrial fibrillation: Secondary | ICD-10-CM | POA: Diagnosis not present

## 2023-05-30 DIAGNOSIS — Z833 Family history of diabetes mellitus: Secondary | ICD-10-CM

## 2023-05-30 DIAGNOSIS — Z888 Allergy status to other drugs, medicaments and biological substances status: Secondary | ICD-10-CM

## 2023-05-30 DIAGNOSIS — I255 Ischemic cardiomyopathy: Secondary | ICD-10-CM | POA: Diagnosis present

## 2023-05-30 DIAGNOSIS — Z87891 Personal history of nicotine dependence: Secondary | ICD-10-CM

## 2023-05-30 DIAGNOSIS — R6510 Systemic inflammatory response syndrome (SIRS) of non-infectious origin without acute organ dysfunction: Secondary | ICD-10-CM | POA: Diagnosis present

## 2023-05-30 DIAGNOSIS — J4489 Other specified chronic obstructive pulmonary disease: Secondary | ICD-10-CM | POA: Diagnosis present

## 2023-05-30 DIAGNOSIS — K7460 Unspecified cirrhosis of liver: Secondary | ICD-10-CM | POA: Diagnosis present

## 2023-05-30 DIAGNOSIS — I5082 Biventricular heart failure: Secondary | ICD-10-CM | POA: Diagnosis present

## 2023-05-30 DIAGNOSIS — J189 Pneumonia, unspecified organism: Principal | ICD-10-CM | POA: Diagnosis present

## 2023-05-30 DIAGNOSIS — I4892 Unspecified atrial flutter: Secondary | ICD-10-CM | POA: Diagnosis not present

## 2023-05-30 DIAGNOSIS — D6869 Other thrombophilia: Secondary | ICD-10-CM | POA: Diagnosis present

## 2023-05-30 DIAGNOSIS — Z66 Do not resuscitate: Secondary | ICD-10-CM | POA: Diagnosis not present

## 2023-05-30 DIAGNOSIS — R001 Bradycardia, unspecified: Secondary | ICD-10-CM | POA: Diagnosis not present

## 2023-05-30 DIAGNOSIS — Z79899 Other long term (current) drug therapy: Secondary | ICD-10-CM

## 2023-05-30 DIAGNOSIS — I5023 Acute on chronic systolic (congestive) heart failure: Secondary | ICD-10-CM | POA: Diagnosis present

## 2023-05-30 DIAGNOSIS — Z85828 Personal history of other malignant neoplasm of skin: Secondary | ICD-10-CM

## 2023-05-30 DIAGNOSIS — Z820 Family history of epilepsy and other diseases of the nervous system: Secondary | ICD-10-CM

## 2023-05-30 DIAGNOSIS — I252 Old myocardial infarction: Secondary | ICD-10-CM

## 2023-05-30 DIAGNOSIS — Z7951 Long term (current) use of inhaled steroids: Secondary | ICD-10-CM

## 2023-05-30 DIAGNOSIS — E781 Pure hyperglyceridemia: Secondary | ICD-10-CM | POA: Diagnosis present

## 2023-05-30 DIAGNOSIS — I5084 End stage heart failure: Secondary | ICD-10-CM | POA: Diagnosis present

## 2023-05-30 DIAGNOSIS — R0602 Shortness of breath: Secondary | ICD-10-CM | POA: Diagnosis not present

## 2023-05-30 DIAGNOSIS — Z515 Encounter for palliative care: Secondary | ICD-10-CM

## 2023-05-30 DIAGNOSIS — I2510 Atherosclerotic heart disease of native coronary artery without angina pectoris: Secondary | ICD-10-CM | POA: Diagnosis present

## 2023-05-30 DIAGNOSIS — N189 Chronic kidney disease, unspecified: Secondary | ICD-10-CM | POA: Diagnosis present

## 2023-05-30 DIAGNOSIS — E875 Hyperkalemia: Secondary | ICD-10-CM | POA: Diagnosis present

## 2023-05-30 DIAGNOSIS — E871 Hypo-osmolality and hyponatremia: Secondary | ICD-10-CM | POA: Diagnosis present

## 2023-05-30 DIAGNOSIS — Z6821 Body mass index (BMI) 21.0-21.9, adult: Secondary | ICD-10-CM

## 2023-05-30 DIAGNOSIS — E1165 Type 2 diabetes mellitus with hyperglycemia: Secondary | ICD-10-CM | POA: Diagnosis present

## 2023-05-30 DIAGNOSIS — R570 Cardiogenic shock: Secondary | ICD-10-CM | POA: Diagnosis present

## 2023-05-30 DIAGNOSIS — Z8546 Personal history of malignant neoplasm of prostate: Secondary | ICD-10-CM

## 2023-05-30 DIAGNOSIS — I4510 Unspecified right bundle-branch block: Secondary | ICD-10-CM | POA: Diagnosis present

## 2023-05-30 DIAGNOSIS — Z1152 Encounter for screening for COVID-19: Secondary | ICD-10-CM

## 2023-05-30 DIAGNOSIS — N1831 Chronic kidney disease, stage 3a: Secondary | ICD-10-CM | POA: Diagnosis present

## 2023-05-30 DIAGNOSIS — I130 Hypertensive heart and chronic kidney disease with heart failure and stage 1 through stage 4 chronic kidney disease, or unspecified chronic kidney disease: Principal | ICD-10-CM | POA: Diagnosis present

## 2023-05-30 DIAGNOSIS — K7200 Acute and subacute hepatic failure without coma: Secondary | ICD-10-CM | POA: Diagnosis not present

## 2023-05-30 DIAGNOSIS — Z7901 Long term (current) use of anticoagulants: Secondary | ICD-10-CM

## 2023-05-30 LAB — COMPREHENSIVE METABOLIC PANEL
ALT: 24 U/L (ref 0–44)
AST: 30 U/L (ref 15–41)
Albumin: 4.2 g/dL (ref 3.5–5.0)
Alkaline Phosphatase: 55 U/L (ref 38–126)
Anion gap: 12 (ref 5–15)
BUN: 52 mg/dL — ABNORMAL HIGH (ref 8–23)
CO2: 20 mmol/L — ABNORMAL LOW (ref 22–32)
Calcium: 8.9 mg/dL (ref 8.9–10.3)
Chloride: 101 mmol/L (ref 98–111)
Creatinine, Ser: 1.7 mg/dL — ABNORMAL HIGH (ref 0.61–1.24)
GFR, Estimated: 39 mL/min — ABNORMAL LOW (ref >60)
Glucose, Bld: 180 mg/dL — ABNORMAL HIGH (ref 70–99)
Potassium: 5.3 mmol/L — ABNORMAL HIGH (ref 3.5–5.1)
Sodium: 133 mmol/L — ABNORMAL LOW (ref 135–145)
Total Bilirubin: 0.7 mg/dL (ref 0.0–1.2)
Total Protein: 7 g/dL (ref 6.5–8.1)

## 2023-05-30 LAB — TROPONIN I (HIGH SENSITIVITY)
Troponin I (High Sensitivity): 31 ng/L — ABNORMAL HIGH (ref <18)
Troponin I (High Sensitivity): 29 ng/L — ABNORMAL HIGH (ref <18)

## 2023-05-30 LAB — CBC
HCT: 43.2 % (ref 39.0–52.0)
Hemoglobin: 14 g/dL (ref 13.0–17.0)
MCH: 28.7 pg (ref 26.0–34.0)
MCHC: 32.4 g/dL (ref 30.0–36.0)
MCV: 88.7 fL (ref 80.0–100.0)
Platelets: 254 10*3/uL (ref 150–400)
RBC: 4.87 MIL/uL (ref 4.22–5.81)
RDW: 14.1 % (ref 11.5–15.5)
WBC: 7.5 10*3/uL (ref 4.0–10.5)
nRBC: 0 % (ref 0.0–0.2)

## 2023-05-30 LAB — RESP PANEL BY RT-PCR (RSV, FLU A&B, COVID)  RVPGX2
Influenza A by PCR: NEGATIVE
Influenza B by PCR: NEGATIVE
Resp Syncytial Virus by PCR: NEGATIVE
SARS Coronavirus 2 by RT PCR: NEGATIVE

## 2023-05-30 LAB — BRAIN NATRIURETIC PEPTIDE: B Natriuretic Peptide: 2426.1 pg/mL — ABNORMAL HIGH (ref 0.0–100.0)

## 2023-05-30 LAB — TSH: TSH: 4.151 u[IU]/mL (ref 0.350–4.500)

## 2023-05-30 MED ORDER — DILTIAZEM HCL-DEXTROSE 125-5 MG/125ML-% IV SOLN (PREMIX)
5.0000 mg/h | INTRAVENOUS | Status: DC
Start: 2023-05-30 — End: 2023-05-30
  Administered 2023-05-30: 5 mg/h via INTRAVENOUS
  Filled 2023-05-30: qty 125

## 2023-05-30 NOTE — ED Provider Notes (Signed)
 Accepted handoff at shift change from [facility], [place]. Please see prior provider note for more detail.   Briefly: Patient is 83 y.o. presenting for shortness of breath and fatigue.  Noted to be in A-fib with RVR on arrival.  DDX: concern for A-fib with RVR, ACS, CHF exacerbation, other  Plan: Admit to hospital for A-fib with RVR and CHF exacerbation   Physical Exam  BP 99/81   Pulse 95   Temp 99.1 F (37.3 C)   Resp (!) 22   SpO2 93%   Physical Exam  Procedures  .Critical Care  Performed by: Gareth Eagle, PA-C Authorized by: Gareth Eagle, PA-C   Critical care provider statement:    Critical care time (minutes):  30   Critical care was necessary to treat or prevent imminent or life-threatening deterioration of the following conditions:  Sepsis   Critical care was time spent personally by me on the following activities:  Development of treatment plan with patient or surrogate, discussions with consultants, evaluation of patient's response to treatment, examination of patient, ordering and review of laboratory studies, ordering and review of radiographic studies, ordering and performing treatments and interventions, pulse oximetry, re-evaluation of patient's condition and review of old charts   ED Course / MDM   Clinical Course as of 05/30/23 2313  Thu May 30, 2023  2035 CBC Negative. [CF]  2035 Troponin I (High Sensitivity)(!) Initial troponin is elevated in the setting of atrial fibrillation with RVR. [CF]  2130 TSH Normal.  [CF]  2132 Brain natriuretic peptide(!) Significantly elevated. About double from baseline.  [CF]  2139 Afib with RVR, BNP is elevated. Appears to be euvolemic. Prior history of afib on eliquis. Plan to admit. Awaiting cards consult. [JR]  2149 I spoke with Dr Orson Aloe with cardiology. He recommends cardioversion and potential discharge. He also recommends discontinuing diltiazem. [CF]  2209 Resp panel by RT-PCR (RSV, Flu A&B, Covid)  Anterior Nasal Swab Negative.  [CF]    Clinical Course User Index [CF] Teressa Lower, PA-C [JR] Gareth Eagle, PA-C   Medical Decision Making Amount and/or Complexity of Data Reviewed Labs: ordered. Decision-making details documented in ED Course. Radiology: ordered.  Risk Prescription drug management. Decision regarding hospitalization.    Patient remained stable on reassessment.  Signed out to oncoming ED physician Dr. Daun Peacock.  Did review his x-ray which revealed infiltrates.  Given persistent tachycardia and findings on the x-ray, raised concern for sepsis secondary to pneumonia.  Placed patient on broad-spectrum antibiotics.        Gareth Eagle, PA-C 06/01/23 0902    Tegeler, Canary Brim, MD 06/03/23 [phone]

## 2023-05-30 NOTE — ED Triage Notes (Signed)
 Pt was recently treated for afib and hospitalized at [facility].  Pt reports fatigue and activity intolerance.  Pt spoke with afib clinic and they advised for him to take a lasix to see if that would help.  No CP but pt reports sob while laying down and with exertion.

## 2023-05-30 NOTE — Progress Notes (Signed)
 Discharge Progress Report  Patient Details  Name: [name] MRN: [id number] Date of Birth: 10/29/1940 Referring Provider:   Doristine Devoid Pulmonary Rehab Walk Test from 04/01/2023 in [facility]  Referring Provider [facility]        Number of Visits: 3  Reason for Discharge:  Early Exit:  Lack of attendance  Smoking History:  Social History   Tobacco Use  Smoking Status Former   Current packs/day: 0.00   Average packs/day: 1 pack/day for 43.1 years (43.1 ttl pk-yrs)   Types: Cigarettes   Start date: 12/25/1963   Quit date: 01/31/2007   Years since quitting: 16.3  Smokeless Tobacco Never  Tobacco Comments   Former smoker 05/28/23    Diagnosis:  Atrial fibrillation status post cardioversion 04/29/2023 [facility]) - Plan: EKG 12-Lead, EKG 12-Lead  Stage 2 moderate COPD by GOLD classification (HCC)  ADL UCSD:  Pulmonary Assessment Scores     Row Name 04/01/23 1343         ADL UCSD   ADL Phase Entry     SOB Score total 14       CAT Score   CAT Score 10       mMRC Score   mMRC Score 2              Initial Exercise Prescription:  Initial Exercise Prescription - 04/01/23 1400       Date of Initial Exercise RX and Referring Provider   Date 04/01/23    Referring Provider Vassie Loll    Expected Discharge Date 06/27/23      Recumbant Bike   Level 2    RPM 60    Minutes 15    METs 2.2      Recumbant Elliptical   Level 2    RPM 60    Minutes 15    METs 2.2      Prescription Details   Frequency (times per week) 2    Duration Progress to 30 minutes of continuous aerobic without signs/symptoms of physical distress      Intensity   THRR 40-80% of Max Heartrate 54-109    Ratings of Perceived Exertion 11-13    Perceived Dyspnea 0-4      Progression   Progression Continue progressive overload as per policy without signs/symptoms or physical distress.      Resistance Training   Training Prescription  Yes    Weight red bands    Reps 10-15             Discharge Exercise Prescription (Final Exercise Prescription Changes):  Exercise Prescription Changes - 04/11/23 1205       Response to Exercise   Blood Pressure (Admit) 104/70    Blood Pressure (Exit) 100/58    Heart Rate (Admit) 97 bpm    Heart Rate (Exercise) 101 bpm    Heart Rate (Exit) 74 bpm    Oxygen Saturation (Admit) 97 %    Oxygen Saturation (Exercise) 97 %    Oxygen Saturation (Exit) 97 %    Rating of Perceived Exertion (Exercise) 12    Perceived Dyspnea (Exercise) 0    Duration Continue with 30 min of aerobic exercise without signs/symptoms of physical distress.    Intensity THRR New   54-125     Progression   Progression Continue to progress workloads to maintain intensity without signs/symptoms of physical distress.      Resistance Training   Training Prescription Yes  Weight red bands    Reps 10-15    Time 10 Minutes      Recumbant Bike   Level 2    RPM 42    Watts 18    Minutes 15    METs 2.2      Recumbant Elliptical   Level 2    Minutes 15    METs 3             Functional Capacity:  6 Minute Walk     Row Name 04/01/23 1408         6 Minute Walk   Phase Initial     Distance 1254 feet     Walk Time 6 minutes     # of Rest Breaks 0     MPH 2.38     METS 2.46     RPE 11     Perceived Dyspnea  1     VO2 Peak 8.6     Symptoms No     Resting HR 109 bpm     Resting BP 120/64     Resting Oxygen Saturation  96 %     Exercise Oxygen Saturation  during 6 min walk 92 %     Max Ex. HR 2.4 bpm     Max Ex. BP 120/50     2 Minute Post BP 110/50       Interval HR   1 Minute HR 123     2 Minute HR 123     3 Minute HR 111     4 Minute HR 114     5 Minute HR 122     6 Minute HR 126     2 Minute Post HR 93     Interval Heart Rate? Yes       Interval Oxygen   Interval Oxygen? Yes     Baseline Oxygen Saturation % 96 %     1 Minute Oxygen Saturation % 92 %     1 Minute Liters of  Oxygen 0 L     2 Minute Oxygen Saturation % 95 %     2 Minute Liters of Oxygen 0 L     3 Minute Oxygen Saturation % 95 %     3 Minute Liters of Oxygen 0 L     4 Minute Oxygen Saturation % 94 %     4 Minute Liters of Oxygen 0 L     5 Minute Oxygen Saturation % 94 %     5 Minute Liters of Oxygen 0 L     6 Minute Oxygen Saturation % 94 %     6 Minute Liters of Oxygen 0 L     2 Minute Post Oxygen Saturation % 95 %     2 Minute Post Liters of Oxygen 0 L              Psychological, QOL, Others - Outcomes: PHQ 2/9:    04/01/2023    1:02 PM  Depression screen PHQ 2/9  Decreased Interest 0  Down, Depressed, Hopeless 0  PHQ - 2 Score 0  Altered sleeping 1  Tired, decreased energy 1  Change in appetite 0  Feeling bad or failure about yourself  0  Trouble concentrating 0  Moving slowly or fidgety/restless 0  Suicidal thoughts 0  PHQ-9 Score 2  Difficult doing work/chores Somewhat difficult    Quality of Life:   Personal Goals: Goals established at orientation with interventions provided to  work toward goal.  Personal Goals and Risk Factors at Admission - 04/01/23 1320       Core Components/Risk Factors/Patient Goals on Admission   Improve shortness of breath with ADL's Yes    Intervention Provide education, individualized exercise plan and daily activity instruction to help decrease symptoms of SOB with activities of daily living.    Expected Outcomes Short Term: Improve cardiorespiratory fitness to achieve a reduction of symptoms when performing ADLs;Long Term: Be able to perform more ADLs without symptoms or delay the onset of symptoms    Increase knowledge of respiratory medications and ability to use respiratory devices properly  Yes    Expected Outcomes Short Term: Achieves understanding of medications use. Understands that oxygen is a medication prescribed by physician. Demonstrates appropriate use of inhaler and oxygen therapy.;Long Term: Maintain appropriate use of  medications, inhalers, and oxygen therapy.              Personal Goals Discharge:  Goals and Risk Factor Review     Row Name 04/05/23 1107 05/01/23 1126 05/27/23 1003 05/30/23 0748       Core Components/Risk Factors/Patient Goals Review   Personal Goals Review Improve shortness of breath with ADL's;Develop more efficient breathing techniques such as purse lipped breathing and diaphragmatic breathing and practicing self-pacing with activity.;Increase knowledge of respiratory medications and ability to use respiratory devices properly. Improve shortness of breath with ADL's;Develop more efficient breathing techniques such as purse lipped breathing and diaphragmatic breathing and practicing self-pacing with activity.;Increase knowledge of respiratory medications and ability to use respiratory devices properly. Improve shortness of breath with ADL's;Develop more efficient breathing techniques such as purse lipped breathing and diaphragmatic breathing and practicing self-pacing with activity.;Increase knowledge of respiratory medications and ability to use respiratory devices properly. Improve shortness of breath with ADL's;Develop more efficient breathing techniques such as purse lipped breathing and diaphragmatic breathing and practicing self-pacing with activity.;Increase knowledge of respiratory medications and ability to use respiratory devices properly.    Review We are unable to assess Armas's goals at this time. Samy is scheduled to start the program on 04/09/23. Peyton Najjar has attended two sessions so far. Unfortunately he was hospitalized due to Afib. Goal progressing on improving shortness of breath with ADL's. Goal progressing for developing more efficient breathing techniques such as purse lipped breathing and diaphragmatic breathing; and practicing self-pacing with activity. Goal progressing for increase knowledge of respiratory medications and ability to use respiratory devices properly. We  will continue to monitor her progress throughout the program. Undray has attended 3 sessions so far. Unfortunately, Brantlee has missed sessions due to being hospitalized for Afib. He was cleared to come back, but o his first day back he was sent home due to being back in A fib/flutter. Goal progressing on improving shortness of breath with ADL's. He has been able to keep his sats >88% on RA while exercising. He is currently exercising on the recumbant eliptical and the recumbent bike. Goal progressing for developing more efficient breathing techniques such as purse lipped breathing and diaphragmatic breathing; and practicing self-pacing with activity. Goal progressing for increase knowledge of respiratory medications and ability to use respiratory devices properly. We will continue to monitor her progress throughout the program. Peyton Najjar was discharged from [facility] program on 05/29/23 due to uncontrolled Afib. Unfortunately, Peyton Najjar only attended 3 sessions. He did not meet any of his goals.    Expected Outcomes See admission goals To improve shortness of breath with ADL's, develop more efficient breathing techniques such as  purse lipped breathing and diaphragmatic breathing; and practicing self-pacing with activity and increase knowledge of respiratory medications and ability to use respiratory devices properly. To improve shortness of breath with ADL's, develop more efficient breathing techniques such as purse lipped breathing and diaphragmatic breathing; and practicing self-pacing with activity and increase knowledge of respiratory medications and ability to use respiratory devices properly. --             Exercise Goals and Review:  Exercise Goals     Row Name 04/01/23 1321             Exercise Goals   Increase Physical Activity Yes       Intervention Provide advice, education, support and counseling about physical activity/exercise needs.;Develop an individualized exercise prescription for aerobic and  resistive training based on initial evaluation findings, risk stratification, comorbidities and participant's personal goals.       Expected Outcomes Short Term: Attend rehab on a regular basis to increase amount of physical activity.;Long Term: Add in home exercise to make exercise part of routine and to increase amount of physical activity.;Long Term: Exercising regularly at least 3-5 days a week.       Increase Strength and Stamina Yes       Intervention Provide advice, education, support and counseling about physical activity/exercise needs.;Develop an individualized exercise prescription for aerobic and resistive training based on initial evaluation findings, risk stratification, comorbidities and participant's personal goals.       Expected Outcomes Short Term: Increase workloads from initial exercise prescription for resistance, speed, and METs.;Short Term: Perform resistance training exercises routinely during rehab and add in resistance training at home;Long Term: Improve cardiorespiratory fitness, muscular endurance and strength as measured by increased METs and functional capacity ( )       Able to understand and use rate of perceived exertion (RPE) scale Yes       Intervention Provide education and explanation on how to use RPE scale       Expected Outcomes Short Term: Able to use RPE daily in rehab to express subjective intensity level;Long Term:  Able to use RPE to guide intensity level when exercising independently       Able to understand and use Dyspnea scale Yes       Intervention Provide education and explanation on how to use Dyspnea scale       Expected Outcomes Short Term: Able to use Dyspnea scale daily in rehab to express subjective sense of shortness of breath during exertion;Long Term: Able to use Dyspnea scale to guide intensity level when exercising independently       Knowledge and understanding of Target Heart Rate Range (THRR) Yes       Intervention Provide education and  explanation of THRR including how the numbers were predicted and where they are located for reference       Expected Outcomes Short Term: Able to state/look up THRR;Long Term: Able to use THRR to govern intensity when exercising independently;Short Term: Able to use daily as guideline for intensity in rehab       Understanding of Exercise Prescription Yes       Intervention Provide education, explanation, and written materials on patient's individual exercise prescription       Expected Outcomes Short Term: Able to explain program exercise prescription;Long Term: Able to explain home exercise prescription to exercise independently                Exercise Goals Re-Evaluation:  Exercise Goals Re-Evaluation  Row Name 04/05/23 1456 05/03/23 1307 05/29/23 0820         Exercise Goal Re-Evaluation   Exercise Goals Review Increase Physical Activity;Able to understand and use Dyspnea scale;Understanding of Exercise Prescription;Increase Strength and Stamina;Knowledge and understanding of Target Heart Rate Range (THRR);Able to understand and use rate of perceived exertion (RPE) scale Increase Physical Activity;Able to understand and use Dyspnea scale;Understanding of Exercise Prescription;Increase Strength and Stamina;Knowledge and understanding of Target Heart Rate Range (THRR);Able to understand and use rate of perceived exertion (RPE) scale Increase Physical Activity;Able to understand and use Dyspnea scale;Understanding of Exercise Prescription;Increase Strength and Stamina;Knowledge and understanding of Target Heart Rate Range (THRR);Able to understand and use rate of perceived exertion (RPE) scale     Comments Jess is scheduled to begin exercise on 1/21. Will monitor and progress as able. Peyton Najjar has completed 2 exercise sessions. He exercises for 15 min on the recumbent elliptical and recumbent bike. He averages 3.0 METs at level 2 on the recumbent elliptical and 2.2 METs at level 2 on the  recumbent bike. He performs the warmup and cooldown standing with some verbal cues. It is too soon to notate any discernable progressions. Peyton Najjar has missed several exercise sessions due being hospitalized for Afib. He currently on medical hold until he is approved by his cardiologist. Will continue to monitor and progress as able. Peyton Najjar has completed 2 exercise sessions. His peak METs were 3.1 on the recumbent elliptical and 2.9 on the recumbent bike. Peyton Najjar is being discharged due to physician advise.     Expected Outcomes Through exercise at rehab and home, the patient will decrease shortness of breath with daily activities and feel confident in carrying out an exercise regimen at home. Through exercise at rehab and home, the patient will decrease shortness of breath with daily activities and feel confident in carrying out an exercise regimen at home. Through exercise at rehab and home, the patient will decrease shortness of breath with daily activities and feel confident in carrying out an exercise regimen at home.              Nutrition & Weight - Outcomes:  Pre Biometrics - 04/01/23 1436       Pre Biometrics   Grip Strength 22 kg              Nutrition:   Nutrition Discharge:   Education Questionnaire Score:  Knowledge Questionnaire Score - 04/01/23 1344       Knowledge Questionnaire Score   Pre Score 16/18             Goals reviewed with patient; copy given to patient.

## 2023-05-30 NOTE — ED Provider Notes (Signed)
 [place] EMERGENCY DEPARTMENT AT [facility] Provider Note   CSN: [id number] Arrival date & time: 05/30/23  1926     History Chief Complaint  Patient presents with   Atrial Fibrillation    [name] is a 83 y.o. male patient with history of atrial fibrillation on Eliquis, COPD, hypertension, hypertriglyceridemia who presents to the emerged apartment today with easy fatigability and shortness of breath.  Patient was seen by his care team last week and was noted to be in atrial fibrillation.  He was seen in A-fib clinic on Tuesday and they were talking about whether or not to cardiovert him some point in the near future to get him out or continue medical therapy.  According to the wife, they ultimately decided to increase his amiodarone to 2 daily.  Last 24 hours, patient has now been having some exertional dyspnea and easy fatigability and poor appetite.  He denies any recent illness, cough, congestion, fever, chills.   Atrial Fibrillation       Home Medications Prior to Admission medications   Medication Sig Start Date End Date Taking? Authorizing Provider  albuterol (VENTOLIN HFA) 108 (90 Base) MCG/ACT inhaler Inhale 2 puffs into the lungs every 6 (six) hours as needed for wheezing or shortness of breath. 03/11/23   Burnadette Pop, MD  amiodarone (PACERONE) 200 MG tablet Take 1 tablet (200 mg total) by mouth 2 (two) times daily for 30 days, THEN 1 tablet (200 mg total) daily. 05/28/23 06/26/24  Eustace Pen, PA-C  Cholecalciferol (VITAMIN D3) 25 MCG (1000 UT) CAPS Take 1,000 Units by mouth daily.    [provider]  Cyanocobalamin (VITAMIN B-12 SL) Place 1 tablet under the tongue daily.    [provider]  dapagliflozin propanediol (FARXIGA) 10 MG TABS tablet Take 1 tablet (10 mg total) by mouth daily. 05/01/23   Parcells, Ladona Ridgel A, PA-C  ELIQUIS 5 MG TABS tablet Take 1 tablet (5 mg total) by mouth 2 (two) times daily. 04/22/23   [name], PA  folic  acid (FOLVITE) 1 MG tablet Take 1 mg by mouth daily.    [provider]  furosemide (LASIX) 40 MG tablet Take 1 tablet (40 mg total) by mouth daily. Patient not taking: Reported on 05/28/2023 04/16/23 07/15/23  [name] D, NP  HYDROcodone bit-homatropine (HYCODAN) 5-1.5 MG/5ML syrup Take 5 mLs by mouth every 4 (four) hours as needed for cough. 03/11/23   Burnadette Pop, MD  metFORMIN (GLUCOPHAGE) 500 MG tablet Take 500 mg by mouth daily with breakfast. 02/06/23   [provider]  pravastatin (PRAVACHOL) 40 MG tablet Take 1 tablet (40 mg total) by mouth daily. 02/17/14   Hilty, Lisette Abu, MD  spironolactone (ALDACTONE) 25 MG tablet Take 0.5 tablets (12.5 mg total) by mouth daily. 05/01/23   Parcells, Therisa Doyne, PA-C  SYMBICORT 80-4.5 MCG/ACT inhaler INHALE 2 PUFFS BY MOUTH IN THE MORNING AND 2 PUFFS AT BEDTIME 11/23/22   Oretha Milch, MD      Allergies    Other and Lisinopril    Review of Systems   Review of Systems  All other systems reviewed and are negative.   Physical Exam Updated Vital Signs BP 108/86 (BP Location: Left Arm)   Pulse 81   Temp 99.1 F (37.3 C)   Resp (!) 22   SpO2 95%  Physical Exam Vitals and nursing note reviewed.  Constitutional:      General: He is not in acute distress.  Appearance: Normal appearance.  HENT:     Head: Normocephalic and atraumatic.  Eyes:     General:        Right eye: No discharge.        Left eye: No discharge.  Cardiovascular:     Comments: Tachycardic and irregularly irregular. Pulmonary:     Comments: Clear to auscultation bilaterally.  Normal effort.  No respiratory distress.  No evidence of wheezes, rales, or rhonchi heard throughout. Abdominal:     General: Abdomen is flat. Bowel sounds are normal. There is no distension.     Tenderness: There is no abdominal tenderness. There is no guarding or rebound.  Musculoskeletal:        General: Normal range of motion.     Cervical back: Neck supple.  Skin:     General: Skin is warm and dry.     Findings: No rash.  Neurological:     General: No focal deficit present.     Mental Status: He is alert.  Psychiatric:        Mood and Affect: Mood normal.        Behavior: Behavior normal.     ED Results / Procedures / Treatments   Labs (all labs ordered are listed, but only abnormal results are displayed) Labs Reviewed  BRAIN NATRIURETIC PEPTIDE - Abnormal; Notable for the following components:      Result Value   B Natriuretic Peptide 2,426.1 (*)    All other components within normal limits  COMPREHENSIVE METABOLIC PANEL - Abnormal; Notable for the following components:   Sodium 133 (*)    Potassium 5.3 (*)    CO2 20 (*)    Glucose, Bld 180 (*)    BUN 52 (*)    Creatinine, Ser 1.70 (*)    GFR, Estimated 39 (*)    All other components within normal limits  TROPONIN I (HIGH SENSITIVITY) - Abnormal; Notable for the following components:   Troponin I (High Sensitivity) 31 (*)    All other components within normal limits  RESP PANEL BY RT-PCR (RSV, FLU A&B, COVID)  RVPGX2  CBC  TSH  TROPONIN I (HIGH SENSITIVITY)    EKG None  Radiology No results found.  Procedures .Critical Care  Performed by: Teressa Lower, PA-C Authorized by: Teressa Lower, PA-C   Critical care provider statement:    Critical care time (minutes):  35   Critical care time was exclusive of:  Separately billable procedures and treating other patients   Critical care was necessary to treat or prevent imminent or life-threatening deterioration of the following conditions:  Cardiac failure   Critical care was time spent personally by me on the following activities:  Blood draw for specimens, discussions with consultants, examination of patient, ordering and performing treatments and interventions, ordering and review of laboratory studies, ordering and review of radiographic studies, pulse oximetry, re-evaluation of patient's condition and development of  treatment plan with patient or surrogate     Medications Ordered in ED Medications - No data to display   ED Course/ Medical Decision Making/ A&P Clinical Course as of 05/30/23 2210  Thu May 30, 2023  2035 CBC Negative. [CF]  2035 Troponin I (High Sensitivity)(!) Initial troponin is elevated in the setting of atrial fibrillation with RVR. [CF]  2130 TSH Normal.  [CF]  2132 Brain natriuretic peptide(!) Significantly elevated. About double from baseline.  [CF]  2139 Afib with RVR, BNP is elevated. Appears to be euvolemic. Prior history of  afib on eliquis. Plan to admit. Awaiting cards consult. [JR]  2149 I spoke with Dr Orson Aloe with cardiology. He recommends cardioversion and potential discharge. He also recommends discontinuing diltiazem. [CF]  2209 Resp panel by RT-PCR (RSV, Flu A&B, Covid) Anterior Nasal Swab Negative.  [CF]    Clinical Course User Index [CF] Teressa Lower, PA-C [JR] Gareth Eagle, PA-C   {   Click here for ABCD2, HEART and other calculators  Medical Decision Making [name] is a 83 y.o. male patient who presents to the emergency department today for further evaluation of easy fatigability and shortness of breath.  Patient found to be in atrial fibrillation with RVR upon arrival.  Despite patient being on Eliquis and not missing doses do not feel that patient is a strong cardioversion candidate today.  Discussed case with attending and he is in agreement.  Will plan to do a diltiazem drip.  Once we get the patient's heart rate under control will plan to speak with cardiology.  I do feel the patient does meet inpatient criteria.  Now that the patient does appear to be an active heart failure although does appear to be euvolemic on exam I went ahead and pause the diltiazem as is the contraindication.  Shared decision making was done with patient and wife at the bedside whether not to perform a synchronized cardioversion here per cardiologist  recommendation.  They asked to speak with the attending.  Due to shift change, the rest of her care will be transferred to oncoming provider.  I do feel the patient should stay in the hospital given that his BNP has doubled and he does have a mild AKI.  Disposition to be made pending decision on cardioversion and by oncoming provider.   Amount and/or Complexity of Data Reviewed Labs: ordered. Decision-making details documented in ED Course. Radiology: ordered.  Risk Prescription drug management.    Final Clinical Impression(s) / ED Diagnoses Final diagnoses:  None    Rx / DC Orders ED Discharge Orders     None         Teressa Lower, [place] 05/30/23 2210    Tegeler, Canary Brim, MD 05/31/23 2317

## 2023-05-31 ENCOUNTER — Inpatient Hospital Stay (HOSPITAL_COMMUNITY)

## 2023-05-31 ENCOUNTER — Other Ambulatory Visit: Payer: Self-pay

## 2023-05-31 ENCOUNTER — Telehealth: Payer: Self-pay | Admitting: Internal Medicine

## 2023-05-31 ENCOUNTER — Encounter (HOSPITAL_COMMUNITY): Payer: Self-pay | Admitting: Internal Medicine

## 2023-05-31 DIAGNOSIS — I130 Hypertensive heart and chronic kidney disease with heart failure and stage 1 through stage 4 chronic kidney disease, or unspecified chronic kidney disease: Secondary | ICD-10-CM | POA: Diagnosis present

## 2023-05-31 DIAGNOSIS — Z66 Do not resuscitate: Secondary | ICD-10-CM | POA: Diagnosis not present

## 2023-05-31 DIAGNOSIS — I5020 Unspecified systolic (congestive) heart failure: Secondary | ICD-10-CM

## 2023-05-31 DIAGNOSIS — R918 Other nonspecific abnormal finding of lung field: Secondary | ICD-10-CM | POA: Diagnosis not present

## 2023-05-31 DIAGNOSIS — Z515 Encounter for palliative care: Secondary | ICD-10-CM | POA: Diagnosis not present

## 2023-05-31 DIAGNOSIS — E119 Type 2 diabetes mellitus without complications: Secondary | ICD-10-CM | POA: Diagnosis not present

## 2023-05-31 DIAGNOSIS — I4819 Other persistent atrial fibrillation: Secondary | ICD-10-CM | POA: Diagnosis not present

## 2023-05-31 DIAGNOSIS — Z452 Encounter for adjustment and management of vascular access device: Secondary | ICD-10-CM | POA: Diagnosis not present

## 2023-05-31 DIAGNOSIS — R6510 Systemic inflammatory response syndrome (SIRS) of non-infectious origin without acute organ dysfunction: Secondary | ICD-10-CM | POA: Diagnosis not present

## 2023-05-31 DIAGNOSIS — Z7189 Other specified counseling: Secondary | ICD-10-CM | POA: Diagnosis not present

## 2023-05-31 DIAGNOSIS — Z1152 Encounter for screening for COVID-19: Secondary | ICD-10-CM | POA: Diagnosis not present

## 2023-05-31 DIAGNOSIS — N1831 Chronic kidney disease, stage 3a: Secondary | ICD-10-CM | POA: Diagnosis present

## 2023-05-31 DIAGNOSIS — N179 Acute kidney failure, unspecified: Secondary | ICD-10-CM

## 2023-05-31 DIAGNOSIS — J4489 Other specified chronic obstructive pulmonary disease: Secondary | ICD-10-CM

## 2023-05-31 DIAGNOSIS — K7460 Unspecified cirrhosis of liver: Secondary | ICD-10-CM | POA: Diagnosis present

## 2023-05-31 DIAGNOSIS — D6869 Other thrombophilia: Secondary | ICD-10-CM | POA: Diagnosis present

## 2023-05-31 DIAGNOSIS — K567 Ileus, unspecified: Secondary | ICD-10-CM | POA: Diagnosis not present

## 2023-05-31 DIAGNOSIS — N189 Chronic kidney disease, unspecified: Secondary | ICD-10-CM

## 2023-05-31 DIAGNOSIS — E1122 Type 2 diabetes mellitus with diabetic chronic kidney disease: Secondary | ICD-10-CM | POA: Diagnosis present

## 2023-05-31 DIAGNOSIS — I5023 Acute on chronic systolic (congestive) heart failure: Secondary | ICD-10-CM

## 2023-05-31 DIAGNOSIS — E1165 Type 2 diabetes mellitus with hyperglycemia: Secondary | ICD-10-CM | POA: Diagnosis present

## 2023-05-31 DIAGNOSIS — I4892 Unspecified atrial flutter: Secondary | ICD-10-CM | POA: Diagnosis present

## 2023-05-31 DIAGNOSIS — J90 Pleural effusion, not elsewhere classified: Secondary | ICD-10-CM | POA: Diagnosis not present

## 2023-05-31 DIAGNOSIS — J9811 Atelectasis: Secondary | ICD-10-CM | POA: Diagnosis not present

## 2023-05-31 DIAGNOSIS — E43 Unspecified severe protein-calorie malnutrition: Secondary | ICD-10-CM | POA: Diagnosis present

## 2023-05-31 DIAGNOSIS — R7989 Other specified abnormal findings of blood chemistry: Secondary | ICD-10-CM | POA: Diagnosis not present

## 2023-05-31 DIAGNOSIS — N183 Chronic kidney disease, stage 3 unspecified: Secondary | ICD-10-CM | POA: Diagnosis not present

## 2023-05-31 DIAGNOSIS — J189 Pneumonia, unspecified organism: Secondary | ICD-10-CM | POA: Diagnosis present

## 2023-05-31 DIAGNOSIS — I5082 Biventricular heart failure: Secondary | ICD-10-CM | POA: Diagnosis present

## 2023-05-31 DIAGNOSIS — I5084 End stage heart failure: Secondary | ICD-10-CM | POA: Diagnosis present

## 2023-05-31 DIAGNOSIS — I4510 Unspecified right bundle-branch block: Secondary | ICD-10-CM | POA: Diagnosis present

## 2023-05-31 DIAGNOSIS — K7200 Acute and subacute hepatic failure without coma: Secondary | ICD-10-CM | POA: Diagnosis not present

## 2023-05-31 DIAGNOSIS — R001 Bradycardia, unspecified: Secondary | ICD-10-CM | POA: Diagnosis present

## 2023-05-31 DIAGNOSIS — Z951 Presence of aortocoronary bypass graft: Secondary | ICD-10-CM

## 2023-05-31 DIAGNOSIS — J811 Chronic pulmonary edema: Secondary | ICD-10-CM | POA: Diagnosis not present

## 2023-05-31 DIAGNOSIS — E875 Hyperkalemia: Secondary | ICD-10-CM | POA: Diagnosis not present

## 2023-05-31 DIAGNOSIS — I4891 Unspecified atrial fibrillation: Secondary | ICD-10-CM | POA: Insufficient documentation

## 2023-05-31 DIAGNOSIS — E871 Hypo-osmolality and hyponatremia: Secondary | ICD-10-CM | POA: Diagnosis present

## 2023-05-31 DIAGNOSIS — R0602 Shortness of breath: Secondary | ICD-10-CM | POA: Diagnosis present

## 2023-05-31 DIAGNOSIS — E781 Pure hyperglyceridemia: Secondary | ICD-10-CM | POA: Diagnosis present

## 2023-05-31 DIAGNOSIS — K5900 Constipation, unspecified: Secondary | ICD-10-CM | POA: Diagnosis not present

## 2023-05-31 DIAGNOSIS — R570 Cardiogenic shock: Secondary | ICD-10-CM | POA: Diagnosis not present

## 2023-05-31 LAB — BASIC METABOLIC PANEL
Anion gap: 12 (ref 5–15)
Anion gap: 18 — ABNORMAL HIGH (ref 5–15)
Anion gap: 27 — ABNORMAL HIGH (ref 5–15)
BUN: 58 mg/dL — ABNORMAL HIGH (ref 8–23)
BUN: 68 mg/dL — ABNORMAL HIGH (ref 8–23)
BUN: 70 mg/dL — ABNORMAL HIGH (ref 8–23)
CO2: 21 mmol/L — ABNORMAL LOW (ref 22–32)
CO2: 15 mmol/L — ABNORMAL LOW (ref 22–32)
CO2: 9 mmol/L — ABNORMAL LOW (ref 22–32)
Calcium: 8.9 mg/dL (ref 8.9–10.3)
Calcium: 8.7 mg/dL — ABNORMAL LOW (ref 8.9–10.3)
Calcium: 8.4 mg/dL — ABNORMAL LOW (ref 8.9–10.3)
Chloride: 102 mmol/L (ref 98–111)
Chloride: 101 mmol/L (ref 98–111)
Chloride: 98 mmol/L (ref 98–111)
Creatinine, Ser: 1.77 mg/dL — ABNORMAL HIGH (ref 0.61–1.24)
Creatinine, Ser: 2.44 mg/dL — ABNORMAL HIGH (ref 0.61–1.24)
Creatinine, Ser: 2.57 mg/dL — ABNORMAL HIGH (ref 0.61–1.24)
GFR, Estimated: 37 mL/min — ABNORMAL LOW (ref >60)
GFR, Estimated: 25 mL/min — ABNORMAL LOW (ref >60)
GFR, Estimated: 24 mL/min — ABNORMAL LOW (ref >60)
Glucose, Bld: 155 mg/dL — ABNORMAL HIGH (ref 70–99)
Glucose, Bld: 173 mg/dL — ABNORMAL HIGH (ref 70–99)
Glucose, Bld: 195 mg/dL — ABNORMAL HIGH (ref 70–99)
Potassium: 5.6 mmol/L — ABNORMAL HIGH (ref 3.5–5.1)
Potassium: 6 mmol/L — ABNORMAL HIGH (ref 3.5–5.1)
Potassium: 5.5 mmol/L — ABNORMAL HIGH (ref 3.5–5.1)
Sodium: 135 mmol/L (ref 135–145)
Sodium: 134 mmol/L — ABNORMAL LOW (ref 135–145)
Sodium: 134 mmol/L — ABNORMAL LOW (ref 135–145)

## 2023-05-31 LAB — MAGNESIUM: Magnesium: 2.7 mg/dL — ABNORMAL HIGH (ref 1.7–2.4)

## 2023-05-31 LAB — MRSA NEXT GEN BY PCR, NASAL: MRSA by PCR Next Gen: NOT DETECTED

## 2023-05-31 LAB — LACTIC ACID, PLASMA: Lactic Acid, Venous: 7.2 mmol/L (ref 0.5–1.9)

## 2023-05-31 MED ORDER — MOMETASONE FURO-FORMOTEROL FUM 100-5 MCG/ACT IN AERO
2.0000 | INHALATION_SPRAY | Freq: Two times a day (BID) | RESPIRATORY_TRACT | Status: DC
  Administered 2023-06-02 – 2023-06-10 (×15): 2 via RESPIRATORY_TRACT
  Filled 2023-05-31 (×4): qty 8.8

## 2023-05-31 MED ORDER — APIXABAN 2.5 MG PO TABS
2.5000 mg | ORAL_TABLET | Freq: Two times a day (BID) | ORAL | Status: DC
  Administered 2023-05-31 – 2023-06-10 (×21): 2.5 mg via ORAL
  Filled 2023-05-31 (×21): qty 1

## 2023-05-31 MED ORDER — SODIUM CHLORIDE 0.9% FLUSH: 10.0000 mL | INTRAVENOUS | Status: DC | PRN

## 2023-05-31 MED ORDER — CHLORHEXIDINE GLUCONATE CLOTH 2 % EX PADS
6.0000 | MEDICATED_PAD | Freq: Every day | CUTANEOUS | Status: DC
  Administered 2023-06-01 – 2023-06-10 (×10): 6 via TOPICAL

## 2023-05-31 MED ORDER — VANCOMYCIN HCL 1250 MG/250ML IV SOLN
1250.0000 mg | INTRAVENOUS | Status: DC
  Filled 2023-05-31: qty 250

## 2023-05-31 MED ORDER — CALCIUM GLUCONATE-NACL 1-0.675 GM/50ML-% IV SOLN
1.0000 g | INTRAVENOUS | Status: AC
  Administered 2023-06-01: 1000 mg via INTRAVENOUS
  Filled 2023-05-31 (×3): qty 50

## 2023-05-31 MED ORDER — AMIODARONE LOAD VIA INFUSION
150.0000 mg | Freq: Once | INTRAVENOUS | Status: AC
  Administered 2023-05-31: 150 mg via INTRAVENOUS
  Filled 2023-05-31: qty 83.34

## 2023-05-31 MED ORDER — AMIODARONE HCL IN DEXTROSE 360-4.14 MG/200ML-% IV SOLN
30.0000 mg/h | INTRAVENOUS | Status: DC
  Administered 2023-06-01 – 2023-06-02 (×4): 30 mg/h via INTRAVENOUS
  Administered 2023-06-03 (×2): 60 mg/h via INTRAVENOUS
  Administered 2023-06-03: 30 mg/h via INTRAVENOUS
  Administered 2023-06-04: 60 mg/h via INTRAVENOUS
  Administered 2023-06-04: 30 mg/h via INTRAVENOUS
  Administered 2023-06-04: 60 mg/h via INTRAVENOUS
  Administered 2023-06-04 – 2023-06-10 (×12): 30 mg/h via INTRAVENOUS
  Filled 2023-05-31 (×21): qty 200

## 2023-05-31 MED ORDER — SODIUM ZIRCONIUM CYCLOSILICATE 10 G PO PACK
10.0000 g | PACK | Freq: Once | ORAL | Status: AC
  Administered 2023-05-31: 10 g via ORAL
  Filled 2023-05-31: qty 1

## 2023-05-31 MED ORDER — PRAVASTATIN SODIUM 40 MG PO TABS
40.0000 mg | ORAL_TABLET | Freq: Every day | ORAL | Status: DC
  Administered 2023-05-31 – 2023-06-09 (×10): 40 mg via ORAL
  Filled 2023-05-31 (×11): qty 1

## 2023-05-31 MED ORDER — SODIUM CHLORIDE 0.9 % IV SOLN
2.0000 g | INTRAVENOUS | Status: AC
Start: 2023-05-31 — End: 2023-06-04
  Administered 2023-06-01 – 2023-06-04 (×5): 2 g via INTRAVENOUS
  Filled 2023-05-31 (×5): qty 12.5

## 2023-05-31 MED ORDER — FUROSEMIDE 10 MG/ML IJ SOLN
80.0000 mg | Freq: Two times a day (BID) | INTRAMUSCULAR | Status: DC
  Administered 2023-05-31 – 2023-06-01 (×2): 80 mg via INTRAVENOUS
  Filled 2023-05-31 (×3): qty 8

## 2023-05-31 MED ORDER — MILRINONE LACTATE IN DEXTROSE 20-5 MG/100ML-% IV SOLN
0.1250 ug/kg/min | INTRAVENOUS | Status: DC
  Administered 2023-05-31 – 2023-06-01 (×3): 0.25 ug/kg/min via INTRAVENOUS
  Administered 2023-06-02 – 2023-06-10 (×14): 0.375 ug/kg/min via INTRAVENOUS
  Filled 2023-05-31 (×17): qty 100

## 2023-05-31 MED ORDER — DILTIAZEM HCL-DEXTROSE 125-5 MG/125ML-% IV SOLN (PREMIX)
5.0000 mg/h | INTRAVENOUS | Status: DC
  Administered 2023-05-31: 5 mg/h via INTRAVENOUS
  Filled 2023-05-31: qty 125

## 2023-05-31 MED ORDER — FUROSEMIDE 10 MG/ML IJ SOLN: 80.0000 mg | Freq: Two times a day (BID) | INTRAMUSCULAR | Status: DC

## 2023-05-31 MED ORDER — DAPAGLIFLOZIN PROPANEDIOL 10 MG PO TABS
10.0000 mg | ORAL_TABLET | Freq: Every day | ORAL | Status: DC
  Administered 2023-05-31: 10 mg via ORAL
  Filled 2023-05-31: qty 1

## 2023-05-31 MED ORDER — SODIUM CHLORIDE 0.9% FLUSH
10.0000 mL | Freq: Two times a day (BID) | INTRAVENOUS | Status: DC
  Administered 2023-05-31 – 2023-06-03 (×5): 10 mL
  Administered 2023-06-03 – 2023-06-04 (×2): 20 mL
  Administered 2023-06-04 – 2023-06-09 (×11): 10 mL

## 2023-05-31 MED ORDER — SODIUM CHLORIDE 0.9% FLUSH
3.0000 mL | Freq: Two times a day (BID) | INTRAVENOUS | Status: DC
  Administered 2023-05-31 – 2023-06-09 (×20): 3 mL via INTRAVENOUS

## 2023-05-31 MED ORDER — ACETAMINOPHEN 650 MG RE SUPP: 650.0000 mg | Freq: Four times a day (QID) | RECTAL | Status: DC | PRN

## 2023-05-31 MED ORDER — TRIMETHOBENZAMIDE HCL 100 MG/ML IM SOLN
200.0000 mg | Freq: Four times a day (QID) | INTRAMUSCULAR | Status: DC | PRN
  Administered 2023-05-31: 200 mg via INTRAMUSCULAR
  Filled 2023-05-31 (×2): qty 2

## 2023-05-31 MED ORDER — SODIUM CHLORIDE 0.9 % IV SOLN
1.0000 g | INTRAVENOUS | Status: AC
  Administered 2023-05-31: 1 g via INTRAVENOUS

## 2023-05-31 MED ORDER — ALBUTEROL SULFATE (2.5 MG/3ML) 0.083% IN NEBU: 2.5000 mg | INHALATION_SOLUTION | RESPIRATORY_TRACT | Status: DC | PRN

## 2023-05-31 MED ORDER — SODIUM BICARBONATE 8.4 % IV SOLN
50.0000 meq | Freq: Once | INTRAVENOUS | Status: AC
  Administered 2023-05-31: 50 meq via INTRAVENOUS
  Filled 2023-05-31: qty 50

## 2023-05-31 MED ORDER — AMIODARONE HCL IN DEXTROSE 360-4.14 MG/200ML-% IV SOLN
60.0000 mg/h | INTRAVENOUS | Status: DC
  Administered 2023-05-31 (×2): 60 mg/h via INTRAVENOUS
  Filled 2023-05-31 (×2): qty 200

## 2023-05-31 MED ORDER — FUROSEMIDE 10 MG/ML IJ SOLN
40.0000 mg | Freq: Once | INTRAMUSCULAR | Status: AC
  Administered 2023-05-31: 40 mg via INTRAVENOUS
  Filled 2023-05-31: qty 4

## 2023-05-31 MED ORDER — VANCOMYCIN HCL IN DEXTROSE 1-5 GM/200ML-% IV SOLN
1000.0000 mg | Freq: Once | INTRAVENOUS | Status: AC
  Administered 2023-05-31: 1000 mg via INTRAVENOUS
  Filled 2023-05-31: qty 200

## 2023-05-31 MED ORDER — FUROSEMIDE 10 MG/ML IJ SOLN: 40.0000 mg | Freq: Once | INTRAMUSCULAR | Status: DC

## 2023-05-31 MED ORDER — SODIUM ZIRCONIUM CYCLOSILICATE 5 G PO PACK
5.0000 g | PACK | Freq: Once | ORAL | Status: AC
  Administered 2023-06-01: 5 g via ORAL
  Filled 2023-05-31: qty 1

## 2023-05-31 MED ORDER — ACETAMINOPHEN 325 MG PO TABS
650.0000 mg | ORAL_TABLET | Freq: Four times a day (QID) | ORAL | Status: DC | PRN
  Administered 2023-06-01 – 2023-06-08 (×3): 650 mg via ORAL
  Filled 2023-05-31 (×3): qty 2

## 2023-05-31 MED ORDER — ENSURE ENLIVE PO LIQD
237.0000 mL | Freq: Two times a day (BID) | ORAL | Status: DC
  Administered 2023-06-01 – 2023-06-05 (×5): 237 mL via ORAL

## 2023-05-31 NOTE — Consult Note (Addendum)
 Cardiology Consultation   Patient ID: [name] MRN: [id number]; DOB: January 05, 1941  Admit date: 05/30/2023 Date of Consult: 05/31/2023  PCP:  Merri Brunette, MD   [facility] Providers Cardiologist:  Jonathan Nose, MD  Electrophysiologist:  Jonathan Lemming, MD      Patient Profile:   [name] is a 83 y.o. 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 Katrinka Blazing.  History of Present Illness:   [name] has past medical history as listed above. He had prior CABG 2008 with last nuc 2020 showing findings felt c/w prior MI, EF 43% at that time. Prior echo 2017 had shown EF 50-55% but not obtained at time of nuc. He had a monitor in 2020 showing moderate PACs, PVCs, intermittent PSVT, short runs of NSVT - was noted to historically be intolerant of BB/CCB due to bradycardia. He was referred to EP and actually found to be in atrial fibrillation in their follow-up, treated with Eliquis and diltiazem, improved to NSR on follow-up. 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 He remained in NSR until recent readmissions. He was admitted 02/2023 with RSV PNA then found to be back in AF by PCP in 03/2023. He was in RVR at time of cardiology follow-up 04/22/23 and ultimately admitted 2/6-2/11/25 and was placed on IV amiodarone. 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 He underwent TEE/DCCV with conversion to NSR (TEE showed EF 20-25% with moderate RV dysfunction, smoke in LAA but no thrombus, mild MR, mild-moderate TR). He was discharged on amiodarone 400mg  daily x 6 days then 200mg  daily, Toprol 25mg  daily, Lasix 40mg  daily, spironolactone  12.5mg  daily, Farxiga 10mg  daily, continued on Eliquis - GDMT otherwise limited by hypotension. Plan was to reassess LVEF as outpatient after maintenance of NSR to guide need for ischemic workup. At f/u 2/18 he was in SB 47bpm so Toprol 25mg  daily stopped. In follow-up 2/24 he was doing much better, HR 60s in NSR. At follow-up 3/7 he was back in AF RVR so was started on diltiazem 120mg  daily that was then stopped by Afib clinic 3/11 who increased amiodarone to 200mg  BID with plans for outpatient DCCV after increased load.  Patient arrived to [facility] ED on 05/30/2023 after being advised to come to the ED per the A. Fib clinic as he was experiencing fatigue, dyspnea on exertion, orthopnea and poor appetite. He was found to be in continued atrial fibrillation with RVR in the ED with HR of 127 on EKG. He reports no missed doses of his amiodarone or his Eliquis.   Patient was not found to be hypotensive or hypoxic. BNP 2,426 (2x prior value), troponin 31 > 29, TSH normal, elevated potassium at 5.3, creatinine 1.70 (1.18 last week), CXR showed mild right perihilar and left basilar atelectasis and/or infiltrate.Patient was started on IV antibiotics and diltiazem drip that was then discontinued due to contraindication with reduced EF.   It was decided that patient should be transferred to [facility] for further inpatient treatment. He was transferred on 3/14, admitted to the medicine service and cardiology was asked to consult.   Since admission to [facility] patient has received IV amiodarone  bolus and placed on drip, IV Lasix 40 mg x 1, resumed Eliquis 2.5 mg BID, continued on IV antibiotics. He is currently in atrial flutter with HR 80-90s. He is currently on 2-3 L of supplemental oxygen via [place] and has been having SpO2 readings 88-95% with intermittent drops to low 80s.    After speaking with patient and his wife, they stated that they were told to switch PO Lasix at home from daily to PRN on 2/12  due to weight loss and frequent urination. He only recently started re-taking it, a couple of days ago, per instruction from A. Fib clinic due to reporting abdominal distention. He agrees with the history as stated above. Noting that he was never experiencing any chest pain, just shortness of breath especially on exertion and becoming profoundly weak. Patient's wife has very good documentation of everything that has happened since he left the hospital. They deny any missed doses of Eliquis or any other medications, unless instructed otherwise, since leaving the hospital.   His wife expresses concern that the patient has not being urinating at all over the last day, while receiving IV Lasix. The RN was in the room when I entered and she was bladder scanning him and said she found only 75 cc on max reading.   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   02/03/2016-respiratory allergy panel, environmental allergens to dust mites, cat dander, dog dander, Timothy grass, cockroach, fungus, cedar trees, ragweed, rough pigweed, mouse urine, IgE is 689 >>>Largest elevations are to cat dander, dog dander, dust mites  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)  06/27/2016-pulmonary function test-FVC 3.03 (87% predicted), postbronchodilator ratio 54, postbronc   Basal cell carcinoma 08/20/2016   left cheek-cx3 excision   BCC (basal cell carcinoma) 09/20/2016   left cheek-mohs   CAD (coronary artery disease)    cath & CABG in  2008   Cardiomyopathy, 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   COPD (chronic obstructive pulmonary disease) (HCC)    COPD with acute exacerbation (HCC) 04/02/2018   Diabetes (HCC)    type 2   DM2 (diabetes mellitus, type 2) (HCC) 02/05/2013   Dyslipidemia    Environmental allergies 02/03/2016   02/03/2016-respiratory allergy panel, environmental allergens to dust mites, cat dander, dog dander, Timothy grass, cockroach, fungus, cedar trees, ragweed, rough pigweed, mouse urine, IgE is 689 >>>Largest elevations are to cat dander, dog dander, dust mites    Essential hypertension 02/03/2016   Former smoker 05/14/2018   Former smoker Quit 2008 43-pack-year smoking history   History of nuclear stress test 10/08/2007   bruce myoview; normal scan with attenuation artifact; no ischemia/infarct; low risk    Hypertriglyceridemia 02/03/2016   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 Maren Beach - LIMA to LAD, sequential saphenous vein graft to OM1 and OM 2, SVG to posterior descending (done emergently)    Past Surgical History:  Procedure Laterality Date   CARDIAC CATHETERIZATION  2003   total occlusion of LAD with left to left collaterals, mod disease of RCA & mild disease in Cfx (Dr. Langston Reusing)   CARDIOVERSION N/A 04/29/2023   Procedure: CARDIOVERSION;  Surgeon: Little Ishikawa, MD;  Location: [facility] INVASIVE CV LAB;  Service: Cardiovascular;  Laterality: N/A;   Carotid Doppler  06/2010   stenosis of ICA less than 50%    CORONARY ARTERY BYPASS GRAFT  05/31/2006   LIMA to LAD, SVG to OM1 & OM2, SVG to PDA (Dr. Donata Clay)   NASAL SEPTOPLASTY W/ TURBINOPLASTY  1970s   PROSTATE BIOPSY     TONSILLECTOMY     TRANSESOPHAGEAL ECHOCARDIOGRAM (CATH LAB) N/A 04/29/2023   Procedure: TRANSESOPHAGEAL ECHOCARDIOGRAM;  Surgeon: Little Ishikawa, MD;  Location: [facility] INVASIVE CV LAB;  Service:  Cardiovascular;  Laterality: N/A;    Home Medications:  Prior to Admission medications   Medication Sig Start Date End Date Taking? Authorizing Provider  amiodarone (PACERONE) 200 MG tablet Take 1 tablet (200 mg total) by mouth 2 (two) times daily for 30 days, THEN 1 tablet (200 mg total) daily. 05/28/23 06/26/24 Yes Eustace Pen, PA-C  Cholecalciferol (VITAMIN D3) 25 MCG (1000 UT) CAPS Take 1,000 Units by mouth daily.   Yes [provider]  dapagliflozin propanediol (FARXIGA) 10 MG TABS tablet Take 1 tablet (10 mg total) by mouth daily. 05/01/23  Yes Parcells, Therisa Doyne, PA-C  ELIQUIS 5 MG TABS tablet Take 1 tablet (5 mg total) by mouth 2 (two) times daily. 04/22/23  Yes [name], PA  folic acid (FOLVITE) 1 MG tablet Take 1 mg by mouth daily.   Yes [provider]  furosemide (LASIX) 40 MG tablet Take 1 tablet (40 mg total) by mouth daily. 04/16/23 07/15/23 Yes [name] D, NP  metFORMIN (GLUCOPHAGE) 500 MG tablet Take 500 mg by mouth daily with breakfast. 02/06/23  Yes [provider]  pravastatin (PRAVACHOL) 40 MG tablet Take 1 tablet (40 mg total) by mouth daily. 02/17/14  Yes Hilty, Lisette Abu, MD  spironolactone (ALDACTONE) 25 MG tablet Take 0.5 tablets (12.5 mg total) by mouth daily. 05/01/23  Yes Parcells, Ladona Ridgel A, PA-C  SYMBICORT 80-4.5 MCG/ACT inhaler INHALE 2 PUFFS BY MOUTH IN THE MORNING AND 2 PUFFS AT BEDTIME Patient taking differently: Inhale 2 puffs into the lungs in the morning and at bedtime. 11/23/22  Yes Oretha Milch, MD   Inpatient Medications: Scheduled Meds:  apixaban  2.5 mg Oral BID   dapagliflozin propanediol  10 mg Oral Daily   feeding supplement  237 mL Oral BID BM   mometasone-formoterol  2 puff Inhalation BID   pravastatin  40 mg Oral Daily   sodium chloride flush  3 mL Intravenous Q12H   Continuous Infusions:  amiodarone 60 mg/hr (05/31/23 1524)   Followed by   amiodarone     ceFEPime (MAXIPIME) IV     [START ON 06/01/2023]  vancomycin     PRN Meds: acetaminophen **OR** acetaminophen, albuterol, trimethobenzamide  Allergies:    Allergies  Allergen Reactions   Other Shortness Of Breath and Other (See Comments)    Feline dander   Lisinopril Cough   Social History:   Social History   Socioeconomic History   Marital status: Married    Spouse name: Not on file   Number of children: Not on file   Years of education: Not on file   Highest education level: Bachelor's degree (e.g., BA, AB, BS)  Occupational History   Not on  file  Tobacco Use   Smoking status: Former    Current packs/day: 0.00    Average packs/day: 1 pack/day for 43.1 years (43.1 ttl pk-yrs)    Types: Cigarettes    Start date: 12/25/1963    Quit date: 01/31/2007    Years since quitting: 16.3   Smokeless tobacco: Never   Tobacco comments:    Former smoker 05/28/23  Vaping Use   Vaping status: Never Used  Substance and Sexual Activity   Alcohol use: Yes    Comment: few drinks a month   Drug use: No   Sexual activity: Not Currently    Partners: Female    Comment: married  Other Topics Concern   Not on file  Social History Narrative   Originally from [place], [place]. Moved to  in 1968. Has worked as a Geophysicist/field seismologist for [employer]. He did attend college in [place] and lived in [place]. He grew up in [place]. Currently has a dog. No bird or mold exposure.    Social Drivers of Corporate investment banker Strain: Not on file  Food Insecurity: No Food Insecurity (05/31/2023)   Hunger Vital Sign    Worried About Running Out of Food in the Last Year: Never true    Ran Out of Food in the Last Year: Never true  Transportation Needs: No Transportation Needs (05/31/2023)   PRAPARE - Administrator, Civil Service (Medical): No    Lack of Transportation (Non-Medical): No  Physical Activity: Not on file  Stress: Not on file  Social Connections: Moderately Isolated (05/31/2023)   Social Connection and Isolation Panel [NHANES]     Frequency of Communication with Friends and Family: Three times a week    Frequency of Social Gatherings with Friends and Family: Once a week    Attends Religious Services: Never    Database administrator or Organizations: No    Attends Banker Meetings: Never    Marital Status: Married  Catering manager Violence: Not At Risk (05/31/2023)   Humiliation, Afraid, Rape, and Kick questionnaire    Fear of Current or Ex-Partner: No    Emotionally Abused: No    Physically Abused: No    Sexually Abused: No    Family History:   Family History  Problem Relation Age of Onset   Diabetes Mother        died of CVA at 61, also HTN   Alzheimer's disease Father    CAD Father    Heart attack Father 37       also HTN   Stroke Maternal Grandmother    Lung disease Neg Hx     ROS:  Please see the history of present illness.  All other ROS reviewed and negative.     Physical Exam/Data:   Vitals:   05/31/23 0930 05/31/23 1006 05/31/23 1030 05/31/23 1128  BP: 118/78 (!) 107/91  110/89  Pulse: (!) 59   97  Resp: 20 (!) 25 (!) 25 20  Temp:      TempSrc:      SpO2: 97%  95% 93%  Weight:    61.6 kg  Height:    5\' 6"  (1.676 m)    Intake/Output Summary (Last 24 hours) at 05/31/2023 1558 Last data filed at 05/31/2023 1524 Gross per 24 hour  Intake 500.18 ml  Output 500 ml  Net 0.18 ml      05/31/2023   11:28 AM 05/28/2023    8:58 AM 05/24/2023  8:15 AM  Last 3 Weights  Weight (lbs) 135 lb 12.9 oz 137 lb 3.2 oz 135 lb  Weight (kg) 61.6 kg 62.234 kg 61.236 kg     Body mass index is 21.92 kg/m.  General:  ill-appearing, on 2.5 L oxygen via [place], seems weak HEENT: normal Vascular: Distal pulses 2+ bilaterally Cardiac:  normal S1, S2; RRR; no murmur  Lungs:  clear to auscultation bilaterally Abd: mild abdominal distention   Ext: no edema Musculoskeletal:  No deformities Skin: warm and dry  Neuro:  no focal abnormalities noted Psych:  Normal affect   EKG:  The EKG was  personally reviewed and demonstrates:  atrial fibrillation with RVR, HR 127 bpm with RBBB (previously noted)  Telemetry:  Telemetry was personally reviewed and demonstrates:  atrial flutter, HR in 80-90s  Relevant CV Studies: TEE guided DCCV 04/29/2023 IMPRESSIONS   1. Left ventricular ejection fraction, by estimation, is 20 to 25%. The left ventricle has severely decreased function. The left ventricle demonstrates global hypokinesis. The left ventricular internal cavity size was mildly dilated.   2. Right ventricular systolic function is moderately reduced. The right ventricular size is moderately enlarged.   3. Left atrial size was moderately dilated. No left atrial/left atrial appendage thrombus was detected. Smoke in LAA, but no thrombus seen   4. Right atrial size was severely dilated.   5. The mitral valve is normal in structure. Mild mitral valve regurgitation.   6. Tricuspid valve regurgitation is mild to moderate.   7. The aortic valve is tricuspid. Aortic valve regurgitation is trivial. No aortic stenosis is present.   Conclusion(s)/Recommendation(s): No LA/LAA thrombus identified. Successful  cardioversion performed with restoration of normal sinus rhythm.   Laboratory Data:  High Sensitivity Troponin:   Recent Labs  Lab 05/30/23 1944 05/30/23 2238  TROPONINIHS 31* 29*     Chemistry Recent Labs  Lab 05/30/23 2032 05/31/23 0848  NA 133* 135  K 5.3* 5.6*  CL 101 102  CO2 20* 21*  GLUCOSE 180* 155*  BUN 52* 58*  CREATININE 1.70* 1.77*  CALCIUM 8.9 8.9  GFRNONAA 39* 37*  ANIONGAP 12 12    Recent Labs  Lab 05/30/23 2032  PROT 7.0  ALBUMIN 4.2  AST 30  ALT 24  ALKPHOS 55  BILITOT 0.7   Lipids No results for input(s): "CHOL", "TRIG", "HDL", "LABVLDL", "LDLCALC", "CHOLHDL" in the last 168 hours.  Hematology Recent Labs  Lab 05/30/23 1944  WBC 7.5  RBC 4.87  HGB 14.0  HCT 43.2  MCV 88.7  MCH 28.7  MCHC 32.4  RDW 14.1  PLT 254   Thyroid  Recent  Labs  Lab 05/30/23 2032  TSH 4.151    BNP Recent Labs  Lab 05/30/23 1944  BNP 2,426.1*    DDimer No results for input(s): "DDIMER" in the last 168 hours.  Radiology/Studies:  DG Chest Port 1 View Result Date: 05/30/2023 CLINICAL DATA:  Shortness of breath with exertion. EXAM: PORTABLE CHEST 1 VIEW COMPARISON:  April 25, 2023 FINDINGS: Multiple sternal wires and vascular clips are noted. The cardiac silhouette is mildly enlarged and unchanged in size. Mild atelectasis and/or infiltrate is seen within the right perihilar region and left lung base. No pleural effusion or pneumothorax is identified. The visualized skeletal structures are unremarkable. IMPRESSION: 1. Evidence of prior median sternotomy/CABG. 2. Mild right perihilar and left basilar atelectasis and/or infiltrate. Electronically Signed   By: Aram Candela M.D.   On: 05/30/2023 22:26   Assessment and Plan:  Persistent atrial fibrillation/flutter with RVR, complicated by baseline bradycardia when in NSR S/p successful TEE guided DCCV on 04/29/2023 Long term anticoagulation on Eliquis CHA2DS2-VASc score of 6  Patient had previously maintained NSR post DCCV in 04/2023 and was found to be back in atrial fib/flutter with RVR during cardiac rehab on 05/21/2023 He very recently seen by A. Fib clinic on 3/11 who decided to re-load him with amiodarone 200 mg BID x 4 weeks (starting 3/11) and were planning to have him follow up in 3 weeks to reassess the need for repeat cardioversion Due to increasing symptoms likely related to his CHF and maintaining an elevated HR patient presented to the ED on 3/13  Currently appears to be in atrial flutter with HR 80s-90s Patient was given IV amiodarone bolus and then placed on drip and started on low-dose Eliquis (appropriate due to increased creatine and age of patient) was previously on full dose  Currently on 2-3 L oxygen via [place], does not wear oxygen at baseline, does not appear comfortable in  the room, seems to still be very weak and short of breath Continue IV amiodarone and Eliquis 2.5 mg BID  Will need to determine timing of repeat DCCV after IV amio load  Acute on chronic HFrEF Moderate MR, moderate TR   AKI on CKD stage 2-3a with hyperkalemia Echo from 04/2023: LVEF 20-25%, no RWMA, mildly reduced RV function, mildly elevated PA systolic pressure, moderate MR, moderate TR  BNP 2,426 -- 2x the value it was one month ago  Patient presented to ED on 3/13 complaining of DOE, fatigue, orthopnea  Home meds, when discharged from hospital: Farxiga 10 mg daily, Lasix 40 mg daily, Toprol 25 mg daily (stopped due to bradycardia), spironolactone 25 mg daily with plans to add ARB/ARNI in outpatient if BP was tolerable  He had been told to change daily PO Lasix to PRN because he was experiencing some weight loss on 2/12 -- he was only recently told to restart when he was complaining of abdominal distention and was presumed to be fluid overloaded  Patient was given IV Lasix 40 mg x 1 upon arrival to [facility]  Weight 135 lb today -- was discharged at a similar weight in 04/2023 Patients wife has reported no real urine output with IV Lasix dose  He has 500 cc output yesterday but there has been nothing today  Concern for low output state given elevated Cr, soft BP, minimal UOP Strict I&O's, continue to monitor BMPs, daily weights  Ordered BMP x 1 STAT to reevaluate potassium level and creatinine -- earlier creatinine 1.77 and potassium 5.6  Have asked Dr. Shirlee Latch with advanced HF to see, may need inotropic support Place PICC, obtain CVP and Co-Ox Hold on spironolactone/ARNI/ARB due to hyperkalemia Hold on beta blocker pending AHF eval  CAD s/p CABG x 4 in 2008 Underwent CABG in 2008 with LIMA to LAD, SVG to PDA, and SVG to OM1 and OM 2  Denies any chest pain at this time only reporting dyspnea on exertion  Patient was not taking any ASA due to Eliquis use  Home meds: pravastatin 40 mg  daily  HsTroponins low/flat Will need consideration of ischemic evaluation at some point contingent on LVEF and management of plan for Afib  Per primary  Diabetes  COPD  Risk Assessment/Risk Scores:       New York Heart Association (NYHA) Functional Class NYHA Class III  CHA2DS2-VASc Score = 6   This indicates a 9.7% annual  risk of stroke. The patient's score is based upon: CHF History: 1 HTN History: 1 Diabetes History: 1 Stroke History: 0 Vascular Disease History: 1 Age Score: 2 Gender Score: 0    For questions or updates, please contact [facility] Please consult [email or web address] for contact info under    Signed, Olena Leatherwood, PA-C  05/31/2023 3:58 PM  Patient seen with PA, I formulated the plan and agree with the above note.   83 y.o. with history of CAD s/p CABG, persistent atrial fibrillation, COPD, DM, hypertension was admitted with AF/RVR and volume overload.  See above for extensive history.   Patient was cardioverted to NSR in 2/25.  At that time, TEE showed EF 20-25%, moderate RV dysfunction, mild-moderate MR.  However, at 3/7 AF clinic appt, he was back in AF/RVR. He ended up in the ER with profound dyspnea and fatigue.  Creatinine up to 1.7 from 1.18, BNP elevated, K 5.6.  CXR concerning for PNA.  Hs-TnI minimally elevated with no trend.   General: Thin/frail Neck: JVP 16 cm, no thyromegaly or thyroid nodule.  Lungs: Distant BS CV: Nondisplaced PMI.  Heart irregular S1/S2, no S3/S4, no murmur.  No peripheral edema.  No carotid bruit.  Difficult to palpate pedal pulses.  Abdomen: Soft, nontender, no hepatosplenomegaly, mild distention.  Skin: Intact without lesions or rashes.  Neurologic: Alert and oriented x 3.  Psych: Normal affect. Extremities: No clubbing or cyanosis. Cool.  HEENT: Normal.   1. Acute on chronic systolic CHF: Cardiolite in 9/20 showed EF 43%, ischemic cardiomyopathy with anteroapical infarction.  In setting of AF/RVR in  2/25, TEE showed EF down to EF 20-25% with moderate RV dysfunction, mild-moderate MR.  Patient is now admitted with NYHA IV symptoms, cardiorenal syndrome with creatinine up to 1.7. 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    Extremities are cool, I am concerned for low output HF.  Still in AF/RVR.  Significant volume overload on exam.  - Will place PICC to follow CVP and co-ox.   - Check lactate - With cool extremities and rise in creatinine, I will empirically start milrinone 0.25 mcg/kg/min.  - Lasix 80 mg IV bid, start after milrinone begun.  - Hold spironolactone with hyperkalemia.  - He can continue [place].  - Needs conversion to NSR (see below). - Not candidate for advanced therapies with age/frailty.  - Narrow QRS, not CRT candidate.  2. Atrial fibrillation: Persistent.  Successful DCCV in 2/25 but back in AF/RVR by 05/24/23.  He has been on po amiodarone. 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  - Reload with IV amiodarone gtt, use 60 mg/hr for now.  - Continue apixaban.  - Plan DCCV after diuresis, may be able to do this Monday. He has not missed any Eliquis doses, does not need TEE.  3. CAD: H/o CABG 2008 with LIMA-LAD, seq SVG-OM1/2, SVG-PDA.  No cath since CABG.  Cardiolite in 9/20 with anteroapical infarction.  No chest pain.  HS-TnI minimally elevated with no trend, suspect demand ischemia from volume overload.  - Think we can avoid coronary angiography in absence of chest pain.  - Not on ASA given Eliquis.  - Continue home pravastatin, check lipids.  4. AKI on CKD stage 3: Creatinine up to 1.7 from around 1.2.  Suspect cardiorenal syndrome with low output HF.  Elevated K today.  - Will give 1 dose of Lokelma.  -  Starting milrinone as above.  - Follow creatinine closely.  5. ID: Possible PNA on CXR.  Afebrile, WBCs not elevated.  - On empiric  cefepime/vancomycin.  6. COPD: Prior smoker.   Marca Ancona 05/31/2023 5:16 PM  Repeat labs showed lactate 7, creatinine up to 2.4, HCO3 15 and K 6.   We are starting milrinone.  Get ECG.  He will get Lokelma 10 g and should also get calcium gluconate + 1 amp HCO3 with hyperkalemia. Expedite PICC, should transfer to 2H. Repeat BMET in 3-4 hrs.   Marca Ancona 05/31/2023

## 2023-05-31 NOTE — Progress Notes (Addendum)
 Peripherally Inserted Central Catheter Placement  The IV Nurse has discussed with the patient and/or persons authorized to consent for the patient, the purpose of this procedure and the potential benefits and risks involved with this procedure.  The benefits include less needle sticks, lab draws from the catheter, and the patient may be discharged home with the catheter. Risks include, but not limited to, infection, bleeding, blood clot (thrombus formation), and puncture of an artery; nerve damage and irregular heartbeat and possibility to perform a PICC exchange if needed/ordered by physician.  Alternatives to this procedure were also discussed.  Bard Power PICC patient education guide, fact sheet on infection prevention and patient information card has been provided to patient /or left at bedside. Awaiting PCXR reading for tip placement.    PICC Placement Documentation  PICC Triple Lumen 05/31/23 Right Brachial 36 cm 0 cm (Active)  Indication for Insertion or Continuance of Line Vasoactive infusions 05/31/23 2135  Exposed Catheter (cm) 0 cm 05/31/23 2135  Site Assessment Clean, Dry, Intact 05/31/23 2135  Lumen #1 Status Blood return noted;Flushed;Saline locked 05/31/23 2135  Lumen #2 Status Blood return noted;Flushed;Saline locked 05/31/23 2135  Lumen #3 Status Blood return noted;Flushed;Saline locked 05/31/23 2135  Dressing Type Transparent;Securing device 05/31/23 2135  Dressing Status Antimicrobial disc/dressing in place 05/31/23 2135  Line Care Connections checked and tightened 05/31/23 2135  Line Adjustment (NICU/IV Team Only) No 05/31/23 2135  Dressing Intervention New dressing;Adhesive placed at insertion site (IV team only) 05/31/23 2135  Dressing Change Due 06/07/23 05/31/23 2135       Christeen Douglas 05/31/2023, 9:56 PM

## 2023-05-31 NOTE — ED Notes (Signed)
 Taryn with cl called for transport

## 2023-05-31 NOTE — H&P (Addendum)
 History and Physical    Patient: [name] ZOX:[id number] DOB: 09-09-1940 DOA: 05/30/2023 DOS: the patient was seen and examined on 05/31/2023 PCP: Merri Brunette, MD  Patient coming from: Transfer from [facility]  Chief Complaint:  Chief Complaint  Patient presents with   Atrial Fibrillation   HPI: [name] is a 83 y.o. male with medical history significant of HTN, HLD, persistent atrial fibrillation, CAD s/p CABG, chronic systolic CHF, diabetes mellitus type 2, and COPD who presents with fatigue, shortness of breath, and abdominal bloating for the past week. Symptoms have been significant enough to prevent participation in his rehabilitation program where he was noted to be in atrial fibrillation.  Wife notes he had been prescribed Cardizem during office visit on 3/7, he followed up with atrial fibrillation clinic and was changed to amiodarone 3 days ago, which he started taking twice daily. Despite this change, his abdominal bloating has worsened, although he notes minimal swelling in his legs.  Patient also reports having symptoms of orthopnea.  His heart rate has been fluctuating between 80 and 113 beats per minute at home.  He had previously underwent cardioversion back on 04/29/2023, which helped him go back into sinus rhythm for a brief period in time.    He is not typically on oxygen at home.[name] His oxygen saturation levels have been generally good, although they occasionally drop into the 80s. He experiences significant shortness of breath with exertion, such as walking short distances, but is less symptomatic when stationary.  Upon admission into the emergency department patient was noted to have a temperature of 99.1 F with heart rates elevated up to 137 in atrial fibrillation, respiration 18-30, blood pressures relatively maintained, and O2 saturations as low as 86% with improvement on 2 L of nasal cannula oxygen.  Labs significant for WBC 7.5, sodium 133, potassium  5.3, BUN 52, creatinine 1.7, glucose 180, TSH 4.151, BNP 2446.1, and high-sensitivity troponin 31->29.  Chest x-ray noted evidence of prior median sternotomy/CABG with mild perihilar and left basilar atelectasis or infiltrate.  Patient had been given empiric antibiotics of vancomycin and cefepime.   Review of Systems: As mentioned in the history of present illness. All other systems reviewed and are negative. 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   02/03/2016-respiratory allergy panel, environmental allergens to dust mites, cat dander, dog dander, Timothy grass, cockroach, fungus, cedar trees, ragweed, rough pigweed, mouse urine, IgE is 689 >>>Largest elevations are to cat dander, dog dander, dust mites  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)  06/27/2016-pulmonary function test-FVC 3.03 (87% predicted), postbronchodilator ratio 54, postbronc   Basal cell carcinoma 08/20/2016   left cheek-cx3 excision   BCC (basal cell carcinoma) 09/20/2016   left cheek-mohs   CAD (coronary artery disease)    cath & CABG in 2008   Cardiomyopathy, 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   COPD (chronic obstructive pulmonary disease) (HCC)    COPD with acute exacerbation (HCC) 04/02/2018   Diabetes (HCC)    type 2   DM2 (diabetes mellitus, type 2) (HCC) 02/05/2013   Dyslipidemia    Environmental allergies  02/03/2016   02/03/2016-respiratory allergy panel, environmental allergens to dust mites, cat dander, dog dander, Timothy grass,  cockroach, fungus, cedar trees, ragweed, rough pigweed, mouse urine, IgE is 689 >>>Largest elevations are to cat dander, dog dander, dust mites    Essential hypertension 02/03/2016   Former smoker 05/14/2018   Former smoker Quit 2008 43-pack-year smoking history   History of nuclear stress test 10/08/2007   bruce myoview; normal scan with attenuation artifact; no ischemia/infarct; low risk    Hypertriglyceridemia 02/03/2016   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 Maren Beach - LIMA to LAD, sequential saphenous vein graft to OM1 and OM 2, SVG to posterior descending (done emergently)    Past Surgical History:  Procedure Laterality Date   CARDIAC CATHETERIZATION  2003   total occlusion of LAD with left to left collaterals, mod disease of RCA & mild disease in Cfx (Dr. Langston Reusing)   CARDIOVERSION N/A 04/29/2023   Procedure: CARDIOVERSION;  Surgeon: Little Ishikawa, MD;  Location: [facility] INVASIVE CV LAB;  Service: Cardiovascular;  Laterality: N/A;   Carotid Doppler  06/2010   stenosis of ICA less than 50%    CORONARY ARTERY BYPASS GRAFT  05/31/2006   LIMA to LAD, SVG to OM1 & OM2, SVG to PDA (Dr. Donata Clay)   NASAL SEPTOPLASTY W/ TURBINOPLASTY  1970s   PROSTATE BIOPSY     TONSILLECTOMY     TRANSESOPHAGEAL ECHOCARDIOGRAM (CATH LAB) N/A 04/29/2023   Procedure: TRANSESOPHAGEAL ECHOCARDIOGRAM;  Surgeon: Little Ishikawa, MD;  Location: [facility] INVASIVE CV LAB;  Service: Cardiovascular;  Laterality: N/A;   Social History:  reports that he quit smoking about 16 years ago. His smoking use included cigarettes. He started smoking about 59 years ago. He has a 43.1 pack-year smoking history. He has never used smokeless tobacco. He reports current alcohol use. He reports that he does not use drugs.  Allergies  Allergen Reactions   Other Shortness Of Breath and Other (See  Comments)    Feline dander   Lisinopril Cough    Family History  Problem Relation Age of Onset   Diabetes Mother        died of CVA at 20, also HTN   Alzheimer's disease Father    CAD Father    Heart attack Father 15       also HTN   Stroke Maternal Grandmother    Lung disease Neg Hx     Prior to Admission medications   Medication Sig Start Date End Date Taking? Authorizing Provider  losartan (COZAAR) 25 MG tablet Take 25 mg by mouth daily.   Yes [provider]  metoprolol tartrate (LOPRESSOR) 25 MG tablet Take 25 mg by mouth 2 (two) times daily.   Yes [provider]  potassium chloride (KLOR-CON) 10 MEQ tablet Take 10 mEq by mouth daily.   Yes [provider]  albuterol (VENTOLIN HFA) 108 (90 Base) MCG/ACT inhaler Inhale 2 puffs into the lungs every 6 (six) hours as needed for wheezing or shortness of breath. 03/11/23   Burnadette Pop, MD  amiodarone (PACERONE) 200 MG tablet Take 1 tablet (200 mg total) by mouth 2 (two) times daily for 30 days, THEN 1 tablet (200 mg total) daily. 05/28/23 06/26/24  Eustace Pen, PA-C  Cholecalciferol (VITAMIN D3) 25 MCG (1000 UT) CAPS Take  1,000 Units by mouth daily.    [provider]  Cyanocobalamin (VITAMIN B-12 SL) Place 1 tablet under the tongue daily.    [provider]  dapagliflozin propanediol (FARXIGA) 10 MG TABS tablet Take 1 tablet (10 mg total) by mouth daily. 05/01/23   Parcells, Ladona Ridgel A, PA-C  ELIQUIS 5 MG TABS tablet Take 1 tablet (5 mg total) by mouth 2 (two) times daily. 04/22/23   [name], PA  folic acid (FOLVITE) 1 MG tablet Take 1 mg by mouth daily.    [provider]  furosemide (LASIX) 40 MG tablet Take 1 tablet (40 mg total) by mouth daily. Patient not taking: Reported on 05/28/2023 04/16/23 07/15/23  [name] D, NP  HYDROcodone bit-homatropine (HYCODAN) 5-1.5 MG/5ML syrup Take 5 mLs by mouth every 4 (four) hours as needed for cough. 03/11/23   Burnadette Pop, MD   metFORMIN (GLUCOPHAGE) 500 MG tablet Take 500 mg by mouth daily with breakfast. 02/06/23   [provider]  pravastatin (PRAVACHOL) 40 MG tablet Take 1 tablet (40 mg total) by mouth daily. 02/17/14   Hilty, Lisette Abu, MD  spironolactone (ALDACTONE) 25 MG tablet Take 0.5 tablets (12.5 mg total) by mouth daily. 05/01/23   Parcells, Ladona Ridgel A, PA-C  SYMBICORT 80-4.5 MCG/ACT inhaler INHALE 2 PUFFS BY MOUTH IN THE MORNING AND 2 PUFFS AT BEDTIME Patient taking differently: Inhale 2 puffs into the lungs in the morning and at bedtime. 11/23/22   Oretha Milch, MD    Physical Exam: Vitals:   05/31/23 0930 05/31/23 1006 05/31/23 1030 05/31/23 1128  BP: 118/78 (!) 107/91  110/89  Pulse: (!) 59   97  Resp: 20 (!) 25 (!) 25 20  Temp:      TempSrc:      SpO2: 97%  95% 93%  Weight:    61.6 kg  Height:    5\' 6"  (1.676 m)   Constitutional: Elderly male who appears to be in no acute distress at this time Eyes: PERRL, lids and conjunctivae normal ENMT: Mucous membranes are moist. .Normal dentition.  Neck: normal, supple  Respiratory: clear to auscultation bilaterally, no wheezing, no crackles.   Cardiovascular: Irregular irregular Abdomen: no tenderness, no masses palpated. No hepatosplenomegaly. Bowel sounds positive.  Musculoskeletal: no clubbing / cyanosis. No joint deformity upper and lower extremities. Good ROM, no contractures. Normal muscle tone.  Skin: no rashes, lesions, ulcers. No induration Neurologic: CN 2-12 grossly intact.  4 out of 5.  Psychiatric: Normal judgment and insight. Alert and oriented x 3. Normal mood.   Data Reviewed:   EKG revealed atrial fibrillation at 127 bpm.  Reviewed labs, imaging, and pertinent records as documented  Assessment and Plan:   Persistent atrial fibrillation on chronic anticoagulation Patient in atrial fibrillation with heart rates in the 110s.  Previously underwent cardioversion 04/29/2023, but has since gone back into atrial fibrillation.   Patient had been started on Cardizem on 3/7 but switched to amiodarone on 3/11. CHA2DS2-VASc score equal to 6. -Admit to a progressive bed -Continue amiodarone drip -Continue Eliquis (dose adjusted due to kidney function) -Appreciate cardiology consultative services we will follow-up for any further recommendations   SIRS Patient had been noted to be tachycardic and tachypneic meeting SIRS criteria.  Chest x-ray had noted mild right perihilar and left basilar atelectasis and/or infiltrateBlood cultures have been obtained and patient had been started on empiric antibiotics of vancomycin and cefepime due to concern for possible pneumonia. -Lactic acid was ordered -Determine need to  continue empiric antibiotics  Hyperkalemia Acute.  Initial potassium noted to be 5.3 with repeat check 5.6. -Held spironolactone due to hyperkalemia  Heart failure with reduced ejection fraction Acute on chronic.  Patient reports having abdominal bloating and orthopnea.  No lower extremity edema appreciated on physical exam.  On admission BNP elevated at  2426.1.  Last echocardiogram noted EF to be 20 to 25% with mildly reduced RV function, mildly elevated PA systolic pressure, moderate MR, moderate TR. -Continuous pulse oximetry with oxygen to maintain O2 saturation greater than 90% -Strict I&Os and daily weights -Lasix 40 mg IV x 1 dose -Reassess response  Acute kidney injury superimposed on chronic kidney disease stage IIIa On admission creatinine noted to be elevated at 1.7.  Baseline creatinine previously noted to be 1.18 when last checked 05/24/2023.  Question cardiorenal syndrome. -Continue to monitor kidney function   Elevated troponin High-sensitivity troponins were 31->29.  Essentially flat and thought likely secondary to demand. -Continue to monitor  Controlled diabetes mellitus type 2, without long-term use of insulin On admission glucose 180.  Last available hemoglobin A1c was 6.4 when checked on  03/09/2023. -Hypoglycemic protocols -Hold metformin and Farxiga due to AKI  CAD Dyslipidemia Patient with prior history of four-vessel CABG back in 2008. -Continue pravastatin  Asthma/COPD Patient without significant wheezing appreciated on physical exam. -Continue pharmacy substitution for Symbicort -Albuterol nebs as needed for shortness of breath/wheezing  DVT prophylaxis: Eliquis Advance Care Planning:   Code Status: Full Code  Consults: Cardiology  Family Communication: Wife of 8 at bedside  Severity of Illness: The appropriate patient status for this patient is OBSERVATION. Observation status is judged to be reasonable and necessary in order to provide the required intensity of service to ensure the patient's safety. The patient's presenting symptoms, physical exam findings, and initial radiographic and laboratory data in the context of their medical condition is felt to place them at decreased risk for further clinical deterioration. Furthermore, it is anticipated that the patient will be medically stable for discharge from the hospital within 2 midnights of admission.   Author: Clydie Braun, MD 05/31/2023 12:58 PM  For on call review [email or web address].

## 2023-05-31 NOTE — Progress Notes (Signed)
 Pharmacy Antibiotic Note  [name] is a 83 y.o. male admitted on 05/30/2023 with pneumonia.  Pharmacy has been consulted for vancomycin and cefepime dosing. Pt is afebrile and WBC is WNL. Antibiotics were started overnight.   Plan: Vancomycin 1250mg  IV Q48H  Cefepime 2g IV 24H F/u renal fxn, C&S, clinical status and peak/trough at [facility] De-escalate as able     Temp (24hrs), Avg:98.7 F (37.1 C), Min:98.1 F (36.7 C), Max:99.1 F (37.3 C)  Recent Labs  Lab 05/24/23 0904 05/30/23 1944 05/30/23 2032  WBC 6.6 7.5  --   CREATININE 1.18  --  1.70*    Estimated Creatinine Clearance: 28.5 mL/min (A) (by C-G formula based on SCr of 1.7 mg/dL (H)).    Allergies  Allergen Reactions   Other Shortness Of Breath and Other (See Comments)    Feline dander   Lisinopril Cough    Antimicrobials this admission: Vanc 3/14>> Cefepime 3/14>>  Dose adjustments this admission: N/A  Microbiology results: Pending  Thank you for allowing pharmacy to be a part of this patient's care.  Jezabel Lecker, Drake Leach 05/31/2023 8:16 AM

## 2023-05-31 NOTE — Progress Notes (Signed)
 Spoke with primary RN regarding PICC placement. RN made aware the PICC will be placed on 3/15, and to place an IVT consult if further access is needed.

## 2023-05-31 NOTE — Telephone Encounter (Signed)
 Spoke to patient's wife.She stated husband went to [facility] ED yesterday with Afib.Stated he is still in ED at [facility] he is waiting to be transferred to [facility]She is requesting to speak to Dr.Hilty.Advised Dr.Hilty is out of office today.She requested to speak to a cardiologist here.Advised she will need to speak to provider there.I make Dr.Hilty aware.

## 2023-05-31 NOTE — Progress Notes (Signed)
   Called by RN with labs, K+ 6.0. Cr 2.4, Lactic acid 7.4. Seen by [facility] this afternoon, now starting on milrinone awaiting PICC line placement. Just received Lokelma. Discussed with Dr. Shirlee Latch, will transfer to 2H -- amp of Bicarb, calcium gluconate 1g now -- repeat BMET in 2 hrs  Signed, Laverda Page, NP-C 05/31/2023, 5:52 PM Pager: [phone]

## 2023-05-31 NOTE — Progress Notes (Signed)
 Pt seen at bedside. Markedly volume overloaded w/ s/s of low output. SCr elevated 1.7 (b/l 1.7), poor UOP despite IV Lasix 40 mg. CO2 20. In setting of persistent afib, currently in 90s on amio gtt.   Most recent echo 2/25 EF 20-25%, RV mildly reduced, moderate restricted MR.   D/w Dr. Shirlee Latch. Will empirically start milrinone 0.25 mcg/kg/min through PIV. Place PICC to monitor co-ox and CVPs. Increases IV Lasix to 80 mg bid. Continue amio gtt. Continue Eliquis.   Give dose of Lokelma for hyperkalemia.   Will need attempt at DCCV once diuresed.   Dr. Shirlee Latch to see and will sign cardiology consult note.   Robbie Lis, PA-C 05/31/2023

## 2023-05-31 NOTE — Plan of Care (Addendum)
 Plan of Care Note for accepted transfer   Patient name: [name] URK:[id number] DOB: [birth year]  Facility requesting transfer: [facility] Requesting Provider: Dr. Nicanor Alcon Facility course: 83 year old male with history of asthma/COPD, CAD, diabetes, hyperlipidemia, hypertension, prostate cancer, persistent A-fib/flutter on Eliquis, HFrEF (EF 20-25% on 04/29/2023) presented to the ED with complaints of fatigue and shortness of breath.  Noted to be in A-fib with RVR on arrival with rate in the 130s.  Not hypotensive or hypoxic.  Labs notable for sodium 133, potassium 5.3, bicarb 20, glucose 180, BUN 52, creatinine 1.7 (baseline 1.1), BNP 2426, troponin 31> 29, TSH normal, COVID/influenza/RSV PCR negative, blood cultures ordered.  Chest x-ray showing mild right perihilar and left basilar atelectasis and/or infiltrate. Patient was given vancomycin and cefepime.  He was started on Cardizem drip initially which was later stopped due to concern for heart failure exacerbation.  Heart rate currently 100-105.  Cardiology consulted by ED physician.  Plan of care: The patient is accepted for admission to Progressive unit at [facility].  [facility] will assume care on arrival to accepting facility. Until arrival, care as per EDP. However, [facility] available 24/7 for questions and assistance.  Check [email or web address] for on-call coverage.  Nursing staff, please call [facility] Admits & Consults System-Wide number under Amion on patient's arrival so appropriate admitting provider can evaluate the pt.

## 2023-05-31 NOTE — Plan of Care (Signed)
   Problem: Education: Goal: Knowledge of General Education information will improve Description: Including pain rating scale, medication(s)/side effects and non-pharmacologic comfort measures Outcome: Progressing   Problem: Clinical Measurements: Goal: Ability to maintain clinical measurements within normal limits will improve Outcome: Progressing

## 2023-05-31 NOTE — ED Notes (Signed)
 RT Note : Patient was found on 2.5L [place]. He is tolerating well at this time at 100%

## 2023-05-31 NOTE — Telephone Encounter (Signed)
 Pt's wife would like a c/b in regards to Afib. The pt is currently in the hospital at [facility] and wanted to know if Dr. [name] could come speak with her when the arrive to [facility] ED. Additionally she said the pt might need a cardioversion. Please advise

## 2023-05-31 NOTE — ED Notes (Signed)
 Pt is resting at this time. Pt is tachy and bp is stable right now. Made MD aware of HR at this time no further intervention will be taken.

## 2023-05-31 NOTE — Plan of Care (Signed)
  Problem: Education: Goal: Knowledge of General Education information will improve Description: Including pain rating scale, medication(s)/side effects and non-pharmacologic comfort measures 05/31/2023 1433 by Wannetta Sender, RN Outcome: Progressing 05/31/2023 1426 by Wannetta Sender, RN Outcome: Progressing   Problem: Clinical Measurements: Goal: Ability to maintain clinical measurements within normal limits will improve Outcome: Progressing   Problem: Education: Goal: Knowledge of disease or condition will improve Outcome: Progressing   Problem: Activity: Goal: Ability to tolerate increased activity will improve Outcome: Progressing

## 2023-06-01 DIAGNOSIS — I5023 Acute on chronic systolic (congestive) heart failure: Secondary | ICD-10-CM | POA: Diagnosis not present

## 2023-06-01 DIAGNOSIS — I4819 Other persistent atrial fibrillation: Secondary | ICD-10-CM | POA: Diagnosis not present

## 2023-06-01 LAB — COMPREHENSIVE METABOLIC PANEL
ALT: 1514 U/L — ABNORMAL HIGH (ref 0–44)
AST: 2704 U/L — ABNORMAL HIGH (ref 15–41)
Albumin: 3 g/dL — ABNORMAL LOW (ref 3.5–5.0)
Alkaline Phosphatase: 47 U/L (ref 38–126)
Anion gap: 14 (ref 5–15)
BUN: 76 mg/dL — ABNORMAL HIGH (ref 8–23)
CO2: 18 mmol/L — ABNORMAL LOW (ref 22–32)
Calcium: 7.9 mg/dL — ABNORMAL LOW (ref 8.9–10.3)
Chloride: 95 mmol/L — ABNORMAL LOW (ref 98–111)
Creatinine, Ser: 2.49 mg/dL — ABNORMAL HIGH (ref 0.61–1.24)
GFR, Estimated: 25 mL/min — ABNORMAL LOW (ref >60)
Glucose, Bld: 276 mg/dL — ABNORMAL HIGH (ref 70–99)
Potassium: 4.4 mmol/L (ref 3.5–5.1)
Sodium: 127 mmol/L — ABNORMAL LOW (ref 135–145)
Total Bilirubin: 1.1 mg/dL (ref 0.0–1.2)
Total Protein: 5.8 g/dL — ABNORMAL LOW (ref 6.5–8.1)

## 2023-06-01 LAB — COOXEMETRY PANEL
Carboxyhemoglobin: 1.3 % (ref 0.5–1.5)
Carboxyhemoglobin: 1.4 % (ref 0.5–1.5)
Carboxyhemoglobin: 1.6 % — ABNORMAL HIGH (ref 0.5–1.5)
Methemoglobin: 0.9 % (ref 0.0–1.5)
Methemoglobin: <0.7 % (ref 0.0–1.5)
Methemoglobin: <0.7 % (ref 0.0–1.5)
O2 Saturation: 60.2 %
O2 Saturation: 70.5 %
O2 Saturation: 73.4 %
Total hemoglobin: 10.6 g/dL — ABNORMAL LOW (ref 12.0–16.0)
Total hemoglobin: 11.7 g/dL — ABNORMAL LOW (ref 12.0–16.0)
Total hemoglobin: 12.5 g/dL (ref 12.0–16.0)

## 2023-06-01 LAB — BASIC METABOLIC PANEL
Anion gap: 18 — ABNORMAL HIGH (ref 5–15)
BUN: 83 mg/dL — ABNORMAL HIGH (ref 8–23)
CO2: 17 mmol/L — ABNORMAL LOW (ref 22–32)
Calcium: 7.7 mg/dL — ABNORMAL LOW (ref 8.9–10.3)
Chloride: 90 mmol/L — ABNORMAL LOW (ref 98–111)
Creatinine, Ser: 2.6 mg/dL — ABNORMAL HIGH (ref 0.61–1.24)
GFR, Estimated: 24 mL/min — ABNORMAL LOW (ref >60)
Glucose, Bld: 239 mg/dL — ABNORMAL HIGH (ref 70–99)
Potassium: 3.8 mmol/L (ref 3.5–5.1)
Sodium: 125 mmol/L — ABNORMAL LOW (ref 135–145)

## 2023-06-01 LAB — CBC
HCT: 33.7 % — ABNORMAL LOW (ref 39.0–52.0)
Hemoglobin: 11.4 g/dL — ABNORMAL LOW (ref 13.0–17.0)
MCH: 29.2 pg (ref 26.0–34.0)
MCHC: 33.8 g/dL (ref 30.0–36.0)
MCV: 86.2 fL (ref 80.0–100.0)
Platelets: 168 10*3/uL (ref 150–400)
RBC: 3.91 MIL/uL — ABNORMAL LOW (ref 4.22–5.81)
RDW: 13.7 % (ref 11.5–15.5)
WBC: 11.6 10*3/uL — ABNORMAL HIGH (ref 4.0–10.5)
nRBC: 0 % (ref 0.0–0.2)

## 2023-06-01 LAB — MAGNESIUM: Magnesium: 2.2 mg/dL (ref 1.7–2.4)

## 2023-06-01 LAB — LACTIC ACID, PLASMA: Lactic Acid, Venous: 1.9 mmol/L (ref 0.5–1.9)

## 2023-06-01 MED ORDER — FUROSEMIDE 10 MG/ML IJ SOLN: 80.0000 mg | Freq: Once | INTRAMUSCULAR | Status: DC

## 2023-06-01 MED ORDER — DEXTROSE 5 % IV SOLN
10.0000 mg/h | INTRAVENOUS | Status: DC
  Administered 2023-06-01 – 2023-06-02 (×3): 15 mg/h via INTRAVENOUS
  Administered 2023-06-03 – 2023-06-04 (×2): 10 mg/h via INTRAVENOUS
  Filled 2023-06-01 (×5): qty 20

## 2023-06-01 MED ORDER — METOLAZONE 2.5 MG PO TABS
2.5000 mg | ORAL_TABLET | Freq: Once | ORAL | Status: AC
  Administered 2023-06-01: 2.5 mg via ORAL
  Filled 2023-06-01: qty 1

## 2023-06-01 NOTE — Progress Notes (Signed)
 Patient ID: [name], male   DOB: 16-Jun-1940, 83 y.o.   MRN: [id number]     Advanced Heart Failure Rounding Note  Cardiologist: Chrystie Nose, MD  Chief Complaint: CHF Subjective:    Patient is now on milrinone 0.25 mcg/kg/min.  Co-ox up to 71%, lactate 7.2 => 1.9.  Creatinine 2.57 => 2.49.  He had some UOP overnight but not recorded.  CVP 17 this morning.   AST 2704 ALT 1514  Empiric vancomycin/cefepime for possible PNA. Afebrile, WBC 11.6.   In atrial fibrillation rate 90s, on amiodarone gtt and apixaban.    Objective:   Weight Range: 61.6 kg Body mass index is 21.92 kg/m.   Vital Signs:   Temp:  [98.1 F (36.7 C)] 98.1 F (36.7 C) (03/14 0800) Pulse Rate:  [59-110] 97 (03/15 0645) Resp:  [13-39] 15 (03/15 0645) BP: (84-118)/(43-94) 84/71 (03/15 0645) SpO2:  [89 %-100 %] 96 % (03/15 0645) Weight:  [61.6 kg] 61.6 kg (03/14 1128) Last BM Date : 05/30/23  Weight change: Filed Weights   05/31/23 1128  Weight: 61.6 kg    Intake/Output:   Intake/Output Summary (Last 24 hours) at 06/01/2023 0743 Last data filed at 06/01/2023 0703 Gross per 24 hour  Intake 763.94 ml  Output --  Net 763.94 ml      Physical Exam    General:  Well appearing. No resp difficulty HEENT: Normal Neck: Supple. JVP 16+. Carotids 2+ bilat; no bruits. No lymphadenopathy or thyromegaly appreciated. Cor: PMI nondisplaced. Irregular rate & rhythm. No rubs, gallops or murmurs. Lungs: Clear Abdomen: Soft, nontender, nondistended. No hepatosplenomegaly. No bruits or masses. Good bowel sounds. Extremities: No cyanosis, clubbing, rash, edema. Warmer this morning.  Neuro: Alert & orientedx3, cranial nerves grossly intact. moves all 4 extremities w/o difficulty. Affect pleasant   Telemetry   Atrial fibrillation rate 90s (personally reviewed)  Labs    CBC Recent Labs    05/30/23 1944 06/01/23 0507  WBC 7.5 11.6*  HGB 14.0 11.4*  HCT 43.2 33.7*  MCV 88.7 86.2  PLT 254 168    Basic Metabolic Panel Recent Labs    40/98/11 1659 05/31/23 2118 06/01/23 0507  NA 134* 134* 127*  K 6.0* 5.5* 4.4  CL 101 98 95*  CO2 15* 9* 18*  GLUCOSE 173* 195* 276*  BUN 68* 70* 76*  CREATININE 2.44* 2.57* 2.49*  CALCIUM 8.7* 8.4* 7.9*  MG 2.7*  --  2.2   Liver Function Tests Recent Labs    05/30/23 2032 06/01/23 0507  AST 30 2,704*  ALT 24 1,514*  ALKPHOS 55 47  BILITOT 0.7 1.1  PROT 7.0 5.8*  ALBUMIN 4.2 3.0*   No results for input(s): "LIPASE", "AMYLASE" in the last 72 hours. Cardiac Enzymes No results for input(s): "CKTOTAL", "CKMB", "CKMBINDEX", "TROPONINI" in the last 72 hours.  BNP: BNP (last 3 results) Recent Labs    03/08/23 1105 04/25/23 1151 05/30/23 1944  BNP 439.5* 1,246.8* 2,426.1*    ProBNP (last 3 results) No results for input(s): "PROBNP" in the last 8760 hours.   D-Dimer No results for input(s): "DDIMER" in the last 72 hours. Hemoglobin A1C No results for input(s): "HGBA1C" in the last 72 hours. Fasting Lipid Panel No results for input(s): "CHOL", "HDL", "LDLCALC", "TRIG", "CHOLHDL", "LDLDIRECT" in the last 72 hours. Thyroid Function Tests Recent Labs    05/30/23 2032  TSH 4.151    Other results:   Imaging    DG CHEST PORT 1 VIEW Result Date: 05/31/2023 CLINICAL  DATA:  PICC line EXAM: PORTABLE CHEST 1 VIEW COMPARISON:  05/30/2023, 12/22/2021, 04/25/2023 FINDINGS: Post sternotomy changes. Cardiomegaly with small pleural effusions. Mild diffuse increased interstitial opacity suggesting edema. More bandlike opacities in the right mid lung and right base suspected to represent atelectasis. New right upper extremity central venous catheter with tip over the SVC. Aortic atherosclerosis IMPRESSION: 1. New right upper extremity central venous catheter with tip over the SVC. 2. Cardiomegaly with small pleural effusions and mild interstitial edema. More bandlike opacities in the right mid lung and right base suspected to represent  atelectasis. Electronically Signed   By: Jasmine Pang M.D.   On: 05/31/2023 22:47   [place] EKG SITE RITE Result Date: 05/31/2023 If Site Rite image not attached, placement could not be confirmed due to current cardiac rhythm.    Medications:     Scheduled Medications:  apixaban  2.5 mg Oral BID   Chlorhexidine Gluconate Cloth  6 each Topical Daily   feeding supplement  237 mL Oral BID BM   furosemide  80 mg Intravenous BID   furosemide  80 mg Intravenous Once   mometasone-formoterol  2 puff Inhalation BID   pravastatin  40 mg Oral Daily   sodium chloride flush  10-40 mL Intracatheter Q12H   sodium chloride flush  3 mL Intravenous Q12H    Infusions:  amiodarone 30 mg/hr (06/01/23 0703)   ceFEPime (MAXIPIME) IV Stopped (06/01/23 0204)   furosemide (LASIX) 200 mg in dextrose 5 % 100 mL (2 mg/mL) infusion     milrinone 0.25 mcg/kg/min (06/01/23 0703)    PRN Medications: acetaminophen **OR** acetaminophen, albuterol, sodium chloride flush, trimethobenzamide    Assessment/Plan   1. Acute on chronic systolic CHF: Cardiolite in 9/20 showed EF 43%, ischemic cardiomyopathy with anteroapical infarction.  In setting of AF/RVR in 2/25, TEE showed EF down to EF 20-25% with moderate RV dysfunction, mild-moderate MR.  Patient is now admitted with NYHA IV symptoms, cardiorenal syndrome with creatinine up to 2.57. 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  Significant volume overload on exam. Initial lactate 7.2, milrinone 0.25 started.  Lactate 1.9 today with co-ox 71%.  Creatinine mildly lower 2.49.  CVP 17 on my read this morning.  Extremities warmer. SBP generally 90s-100s.  - Continue milrinone 0.25 mcg/kg/min.  - Lasix 80 mg IV x 1 then gtt at 15 mg/hr.   - Hold spironolactone with hyperkalemia.  - He can continue [place] for now.  - Needs conversion to NSR (see below). - Not candidate for  advanced therapies with age/frailty.  - Narrow QRS, not CRT candidate.  2. Atrial fibrillation: Persistent.  Successful DCCV in 2/25 but back in AF/RVR by 05/24/23.  He has been on po amiodarone. 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 HR in 90s this morning in AF.  - Continue amiodarone gtt 30 mg/hr for now.  - Continue apixaban.  - Plan DCCV after diuresis, may be able to do this Monday. He has not missed any Eliquis doses, does not need TEE.  3. CAD: H/o CABG 2008 with LIMA-LAD, seq SVG-OM1/2, SVG-PDA.  No cath since CABG.  Cardiolite in 9/20 with anteroapical infarction.  No chest pain.  HS-TnI minimally elevated with no trend, suspect demand ischemia from volume overload.  - Think we can avoid coronary angiography in absence of chest pain.  - Not on ASA given Eliquis.  - Continue home pravastatin, check lipids.  4. AKI on CKD stage 3: Creatinine up to 2.57 peak from around 1.2.  Suspect cardiorenal syndrome with low output HF.  Initially hyperkalemic, now resolved.  Creatinine 2.49 today.  - Support CO with milrinone.  - Follow creatinine closely.  5. ID: Possible PNA on CXR.  Afebrile, WBCs 11.3.   - On empiric cefepime/vancomycin => can stop vancomycin.  6. COPD: Prior smoker.  7. Hyponatremia: Hypervolemic hyponatremia, Na 127 today.  - Fluid restrict 1500 cc today.  8. Elevated LFTs: Suspect shock liver.  - Follow LFTs.   Mobilize, PT/OT.   CRITICAL CARE Performed by: Marca Ancona  Total critical care time: 40 minutes  Critical care time was exclusive of separately billable procedures and treating other patients.  Critical care was necessary to treat or prevent imminent or life-threatening deterioration.  Critical care was time spent personally by me on the following activities: development of treatment plan with patient and/or surrogate as well as nursing, discussions with consultants, evaluation of patient's response to treatment, examination  of patient, obtaining history from patient or surrogate, ordering and performing treatments and interventions, ordering and review of laboratory studies, ordering and review of radiographic studies, pulse oximetry and re-evaluation of patient's condition.   Length of Stay: 1  Marca Ancona, MD  06/01/2023, 7:43 AM  Advanced Heart Failure Team Pager [phone] (M-F; 7a - 5p)  Please contact [facility] for night-coverage after hours (5p -7a ) and weekends on [email or web address]

## 2023-06-01 NOTE — Progress Notes (Signed)
 Patient admitted to ICU 2H for CHF exac complicated by AKI, and A fib w/ rvr on positive ionotrops and Amio.  Discussed with CHF team, Dr Shirlee Latch who will assume primary team role while in the ICU.  TRH will be available once out of the ICU.  Appreciate help from CHF team.   Stephania Fragmin MD [facility]

## 2023-06-01 NOTE — Evaluation (Signed)
 Physical Therapy Evaluation Patient Details Name: [name] MRN: [id number] DOB: December 12, 1940 Today's Date: 06/01/2023  History of Present Illness  [name] is a 83 y.o. male who presents with fatigue, shortness of breath, and abdominal bloating for the past week.  PMH: HTN, HLD, persistent a-fib, CAD s/p CABG, chronic systolic CHF, diabetes mellitus type 2, and COPD  Clinical Impression  Pt admitted with above. PTA pt indep without AD and driving. Pt functioning at contact guard/minA at this time more so for line management. Suspect pt to progress well and be able to return home with spouse without follow up PT services. Acute PT to follow.        If plan is discharge home, recommend the following: Assist for transportation;Help with stairs or ramp for entrance;A little help with walking and/or transfers;A little help with bathing/dressing/bathroom   Can travel by private vehicle        Equipment Recommendations None recommended by PT  Recommendations for Other Services       Functional Status Assessment Patient has had a recent decline in their functional status and demonstrates the ability to make significant improvements in function in a reasonable and predictable amount of time.     Precautions / Restrictions Precautions Precautions: Fall Restrictions Weight Bearing Restrictions Per Provider Order: No      Mobility  Bed Mobility Overal bed mobility: Modified Independent             General bed mobility comments: pt able to bring self to EOB with HOB elevated, PT to manage lines    Transfers Overall transfer level: Needs assistance Equipment used: None Transfers: Sit to/from Stand Sit to Stand: Contact guard assist           General transfer comment: no difficulty, contact guard due to high volume of lines    Ambulation/Gait Ambulation/Gait assistance: Contact guard assist Gait Distance (Feet): 15 Feet (x2, to/from bathroom) Assistive device:  None Gait Pattern/deviations: Step-through pattern, Decreased stride length, Wide base of support Gait velocity: wfl     General Gait Details: pt mildly unsteady, needs assist for lines  Stairs            Wheelchair Mobility     Tilt Bed    Modified Rankin (Stroke Patients Only)       Balance Overall balance assessment: Mild deficits observed, not formally tested                                           Pertinent Vitals/Pain Pain Assessment Pain Assessment: No/denies pain    Home Living Family/patient expects to be discharged to:: Private residence Living Arrangements: Spouse/significant other Available Help at Discharge: Family;Available 24 hours/day Type of Home: House Home Access: Stairs to enter Entrance Stairs-Rails: Right Entrance Stairs-Number of Steps: 5 Alternate Level Stairs-Number of Steps: flight up to bedroom, flight down to laundry Home Layout: Two level;Bed/bath upstairs;Laundry or work area in [employer] Equipment: Agricultural consultant (2 wheels)      Prior Function Prior Level of Function : Independent/Modified Independent;Driving             Mobility Comments: driving, no AD ADLs Comments: indep     Extremity/Trunk Assessment   Upper Extremity Assessment Upper Extremity Assessment: Overall WFL for tasks assessed    Lower Extremity Assessment Lower Extremity Assessment: Overall WFL for tasks assessed    Cervical /  Trunk Assessment Cervical / Trunk Assessment: Normal  Communication   Communication Communication: No apparent difficulties    Cognition Arousal: Alert Behavior During Therapy: WFL for tasks assessed/performed   PT - Cognitive impairments: No apparent impairments                         Following commands: Intact       Cueing Cueing Techniques: Verbal cues     General Comments General comments (skin integrity, edema, etc.): pt in afib, needs 3LO2 via [place], pt assisted to bathroom,  indep with hygiene s/p BM    Exercises     Assessment/Plan    PT Assessment Patient needs continued PT services  PT Problem List Decreased strength;Decreased balance;Decreased mobility       PT Treatment Interventions DME instruction;Gait training;Stair training;Functional mobility training;Therapeutic activities;Therapeutic exercise    PT Goals (Current goals can be found in the Care Plan section)  Acute Rehab PT Goals Patient Stated Goal: home PT Goal Formulation: With patient Time For Goal Achievement: 06/15/23 Potential to Achieve Goals: Good    Frequency Min 2X/week     Co-evaluation               AM-PAC PT "6 Clicks" Mobility  Outcome Measure Help needed turning from your back to your side while in a flat bed without using bedrails?: A Little Help needed moving from lying on your back to sitting on the side of a flat bed without using bedrails?: A Little Help needed moving to and from a bed to a chair (including a wheelchair)?: A Little Help needed standing up from a chair using your arms (e.g., wheelchair or bedside chair)?: A Little Help needed to walk in hospital room?: A Little Help needed climbing 3-5 steps with a railing? : A Lot 6 Click Score: 17    End of Session Equipment Utilized During Treatment: Gait belt Activity Tolerance: Patient tolerated treatment well Patient left: in chair;with call bell/phone within reach;with chair alarm set;with nursing/sitter in room Nurse Communication: Mobility status (RN present during treatment) PT Visit Diagnosis: Unsteadiness on feet (R26.81);Muscle weakness (generalized) (M62.81)    Time: 1121-1140 PT Time Calculation (min) (ACUTE ONLY): 19 min   Charges:   PT Evaluation $PT Eval Low Complexity: 1 Low   PT General Charges $$ ACUTE PT VISIT: 1 Visit         Lewis Shock, PT, DPT Acute Rehabilitation Services Secure chat preferred Office #: [phone]   Iona Hansen 06/01/2023, 1:35 PM

## 2023-06-02 ENCOUNTER — Inpatient Hospital Stay (HOSPITAL_COMMUNITY)

## 2023-06-02 DIAGNOSIS — I5023 Acute on chronic systolic (congestive) heart failure: Secondary | ICD-10-CM | POA: Diagnosis not present

## 2023-06-02 DIAGNOSIS — I4819 Other persistent atrial fibrillation: Secondary | ICD-10-CM | POA: Diagnosis not present

## 2023-06-02 LAB — GLUCOSE, CAPILLARY
Glucose-Capillary: 225 mg/dL — ABNORMAL HIGH (ref 70–99)
Glucose-Capillary: 168 mg/dL — ABNORMAL HIGH (ref 70–99)

## 2023-06-02 LAB — COMPREHENSIVE METABOLIC PANEL
ALT: 1403 U/L — ABNORMAL HIGH (ref 0–44)
ALT: 1344 U/L — ABNORMAL HIGH (ref 0–44)
AST: 1498 U/L — ABNORMAL HIGH (ref 15–41)
AST: 1114 U/L — ABNORMAL HIGH (ref 15–41)
Albumin: 2.9 g/dL — ABNORMAL LOW (ref 3.5–5.0)
Albumin: 3 g/dL — ABNORMAL LOW (ref 3.5–5.0)
Alkaline Phosphatase: 50 U/L (ref 38–126)
Alkaline Phosphatase: 58 U/L (ref 38–126)
Anion gap: 14 (ref 5–15)
Anion gap: 17 — ABNORMAL HIGH (ref 5–15)
BUN: 86 mg/dL — ABNORMAL HIGH (ref 8–23)
BUN: 92 mg/dL — ABNORMAL HIGH (ref 8–23)
CO2: 21 mmol/L — ABNORMAL LOW (ref 22–32)
CO2: 18 mmol/L — ABNORMAL LOW (ref 22–32)
Calcium: 7.6 mg/dL — ABNORMAL LOW (ref 8.9–10.3)
Calcium: 7.6 mg/dL — ABNORMAL LOW (ref 8.9–10.3)
Chloride: 92 mmol/L — ABNORMAL LOW (ref 98–111)
Chloride: 87 mmol/L — ABNORMAL LOW (ref 98–111)
Creatinine, Ser: 2.99 mg/dL — ABNORMAL HIGH (ref 0.61–1.24)
Creatinine, Ser: 3.09 mg/dL — ABNORMAL HIGH (ref 0.61–1.24)
GFR, Estimated: 20 mL/min — ABNORMAL LOW (ref >60)
GFR, Estimated: 19 mL/min — ABNORMAL LOW (ref >60)
Glucose, Bld: 179 mg/dL — ABNORMAL HIGH (ref 70–99)
Glucose, Bld: 315 mg/dL — ABNORMAL HIGH (ref 70–99)
Potassium: 3.5 mmol/L (ref 3.5–5.1)
Potassium: 4.2 mmol/L (ref 3.5–5.1)
Sodium: 127 mmol/L — ABNORMAL LOW (ref 135–145)
Sodium: 122 mmol/L — ABNORMAL LOW (ref 135–145)
Total Bilirubin: 0.9 mg/dL (ref 0.0–1.2)
Total Bilirubin: 1 mg/dL (ref 0.0–1.2)
Total Protein: 5.8 g/dL — ABNORMAL LOW (ref 6.5–8.1)
Total Protein: 6.2 g/dL — ABNORMAL LOW (ref 6.5–8.1)

## 2023-06-02 LAB — CBC
HCT: 34.5 % — ABNORMAL LOW (ref 39.0–52.0)
Hemoglobin: 11.8 g/dL — ABNORMAL LOW (ref 13.0–17.0)
MCH: 29.1 pg (ref 26.0–34.0)
MCHC: 34.2 g/dL (ref 30.0–36.0)
MCV: 85 fL (ref 80.0–100.0)
Platelets: 168 10*3/uL (ref 150–400)
RBC: 4.06 MIL/uL — ABNORMAL LOW (ref 4.22–5.81)
RDW: 13.5 % (ref 11.5–15.5)
WBC: 10.1 10*3/uL (ref 4.0–10.5)
nRBC: 0 % (ref 0.0–0.2)

## 2023-06-02 LAB — HEPATIC FUNCTION PANEL
ALT: 1313 U/L — ABNORMAL HIGH (ref 0–44)
AST: 1226 U/L — ABNORMAL HIGH (ref 15–41)
Albumin: 2.8 g/dL — ABNORMAL LOW (ref 3.5–5.0)
Alkaline Phosphatase: 51 U/L (ref 38–126)
Bilirubin, Direct: 0.3 mg/dL — ABNORMAL HIGH (ref 0.0–0.2)
Indirect Bilirubin: 0.8 mg/dL (ref 0.3–0.9)
Total Bilirubin: 1.1 mg/dL (ref 0.0–1.2)
Total Protein: 5.8 g/dL — ABNORMAL LOW (ref 6.5–8.1)

## 2023-06-02 LAB — BASIC METABOLIC PANEL
Anion gap: 17 — ABNORMAL HIGH (ref 5–15)
BUN: 85 mg/dL — ABNORMAL HIGH (ref 8–23)
CO2: 18 mmol/L — ABNORMAL LOW (ref 22–32)
Calcium: 7.2 mg/dL — ABNORMAL LOW (ref 8.9–10.3)
Chloride: 89 mmol/L — ABNORMAL LOW (ref 98–111)
Creatinine, Ser: 2.86 mg/dL — ABNORMAL HIGH (ref 0.61–1.24)
GFR, Estimated: 21 mL/min — ABNORMAL LOW (ref >60)
Glucose, Bld: 264 mg/dL — ABNORMAL HIGH (ref 70–99)
Potassium: 3.2 mmol/L — ABNORMAL LOW (ref 3.5–5.1)
Sodium: 124 mmol/L — ABNORMAL LOW (ref 135–145)

## 2023-06-02 LAB — COOXEMETRY PANEL
Carboxyhemoglobin: 0.7 % (ref 0.5–1.5)
Carboxyhemoglobin: 1.5 % (ref 0.5–1.5)
Carboxyhemoglobin: 0.9 % (ref 0.5–1.5)
Methemoglobin: 0.7 % (ref 0.0–1.5)
Methemoglobin: 0.9 % (ref 0.0–1.5)
Methemoglobin: <0.7 % (ref 0.0–1.5)
O2 Saturation: 54.4 %
O2 Saturation: 77 %
O2 Saturation: 66.2 %
Total hemoglobin: 12.3 g/dL (ref 12.0–16.0)
Total hemoglobin: 12.7 g/dL (ref 12.0–16.0)
Total hemoglobin: 12.9 g/dL (ref 12.0–16.0)

## 2023-06-02 LAB — MAGNESIUM
Magnesium: 2.2 mg/dL (ref 1.7–2.4)
Magnesium: 2.1 mg/dL (ref 1.7–2.4)

## 2023-06-02 MED ORDER — TOLVAPTAN 15 MG PO TABS
15.0000 mg | ORAL_TABLET | Freq: Once | ORAL | Status: AC
  Administered 2023-06-02: 15 mg via ORAL
  Filled 2023-06-02: qty 1

## 2023-06-02 MED ORDER — POTASSIUM CHLORIDE 20 MEQ PO PACK
40.0000 meq | PACK | Freq: Once | ORAL | Status: AC
  Administered 2023-06-02: 40 meq via ORAL
  Filled 2023-06-02: qty 2

## 2023-06-02 MED ORDER — INSULIN ASPART 100 UNIT/ML IJ SOLN
0.0000 [IU] | INTRAMUSCULAR | Status: DC
  Administered 2023-06-02: 2 [IU] via SUBCUTANEOUS
  Administered 2023-06-03: 3 [IU] via SUBCUTANEOUS
  Administered 2023-06-03 (×2): 1 [IU] via SUBCUTANEOUS
  Administered 2023-06-03: 2 [IU] via SUBCUTANEOUS
  Administered 2023-06-03: 1 [IU] via SUBCUTANEOUS
  Administered 2023-06-04: 2 [IU] via SUBCUTANEOUS
  Administered 2023-06-04: 3 [IU] via SUBCUTANEOUS

## 2023-06-02 MED ORDER — POTASSIUM CHLORIDE CRYS ER 20 MEQ PO TBCR: 40.0000 meq | EXTENDED_RELEASE_TABLET | Freq: Once | ORAL | Status: DC

## 2023-06-02 MED ORDER — TOLVAPTAN 15 MG PO TABS
30.0000 mg | ORAL_TABLET | Freq: Once | ORAL | Status: AC
  Administered 2023-06-02: 30 mg via ORAL
  Filled 2023-06-02: qty 2

## 2023-06-02 MED ORDER — ALUM & MAG HYDROXIDE-SIMETH 200-200-20 MG/5ML PO SUSP
15.0000 mL | Freq: Four times a day (QID) | ORAL | Status: DC | PRN
  Administered 2023-06-02: 15 mL via ORAL
  Filled 2023-06-02: qty 30

## 2023-06-02 MED ORDER — POTASSIUM CHLORIDE CRYS ER 20 MEQ PO TBCR
40.0000 meq | EXTENDED_RELEASE_TABLET | Freq: Once | ORAL | Status: DC
  Filled 2023-06-02: qty 2

## 2023-06-02 MED ORDER — NOREPINEPHRINE 4 MG/250ML-% IV SOLN
0.0000 ug/min | INTRAVENOUS | Status: DC
Start: 2023-06-02 — End: 2023-06-10
  Administered 2023-06-02 – 2023-06-04 (×3): 3 ug/min via INTRAVENOUS
  Administered 2023-06-04: 7 ug/min via INTRAVENOUS
  Administered 2023-06-05: 12 ug/min via INTRAVENOUS
  Administered 2023-06-05: 10 ug/min via INTRAVENOUS
  Administered 2023-06-05 (×3): 12 ug/min via INTRAVENOUS
  Administered 2023-06-06 (×2): 11 ug/min via INTRAVENOUS
  Administered 2023-06-06 – 2023-06-07 (×3): 12 ug/min via INTRAVENOUS
  Administered 2023-06-07: 14 ug/min via INTRAVENOUS
  Administered 2023-06-07 – 2023-06-08 (×3): 12 ug/min via INTRAVENOUS
  Administered 2023-06-08: 14 ug/min via INTRAVENOUS
  Administered 2023-06-08: 12 ug/min via INTRAVENOUS
  Administered 2023-06-08: 13 ug/min via INTRAVENOUS
  Administered 2023-06-08: 12 ug/min via INTRAVENOUS
  Administered 2023-06-09: 11 ug/min via INTRAVENOUS
  Administered 2023-06-09: 13 ug/min via INTRAVENOUS
  Administered 2023-06-09 (×2): 11 ug/min via INTRAVENOUS
  Administered 2023-06-10 (×2): 12 ug/min via INTRAVENOUS
  Filled 2023-06-02 (×27): qty 250

## 2023-06-02 MED ORDER — POTASSIUM CHLORIDE CRYS ER 20 MEQ PO TBCR
40.0000 meq | EXTENDED_RELEASE_TABLET | Freq: Four times a day (QID) | ORAL | Status: AC
  Administered 2023-06-02 (×2): 40 meq via ORAL
  Filled 2023-06-02 (×2): qty 2

## 2023-06-02 NOTE — Plan of Care (Signed)

## 2023-06-02 NOTE — Progress Notes (Signed)
 Patient ID: [name], male   DOB: 1940-07-18, 83 y.o.   MRN: [id number]     Advanced Heart Failure Rounding Note  Cardiologist: Chrystie Nose, MD  Chief Complaint: CHF Subjective:    Co-ox lower at 54% this morning on milrinone 0.25.  Creatinine 2.57 => 2.49 => 2.99.  SBP 90s.  UOP increased overnight, clear urine this morning.  I/Os net negative 821 cc on Lasix gtt 15 mg/hr + metolazone 2.5 yesterday.  Na 125.   AST 2704 => 1498 ALT 1514 =>1413  Empiric cefepime for possible PNA. Afebrile, WBC 11.6 => 10.1.   In atrial fibrillation rate 90s-100s, on amiodarone gtt and apixaban.   Patient feels better overall.    Objective:   Weight Range: 61.4 kg Body mass index is 21.85 kg/m.   Vital Signs:   Temp:  [96.2 F (35.7 C)-97.7 F (36.5 C)] 97.7 F (36.5 C) (03/16 0400) Pulse Rate:  [82-123] 101 (03/16 0700) Resp:  [15-23] 16 (03/16 0700) BP: (81-109)/(51-92) 96/83 (03/16 0700) SpO2:  [79 %-100 %] 95 % (03/16 0700) Weight:  [61.4 kg] 61.4 kg (03/16 0427) Last BM Date : 06/01/23  Weight change: Filed Weights   05/31/23 1128 06/02/23 0427  Weight: 61.6 kg 61.4 kg    Intake/Output:   Intake/Output Summary (Last 24 hours) at 06/02/2023 0733 Last data filed at 06/02/2023 0700 Gross per 24 hour  Intake 1751.49 ml  Output 2595 ml  Net -843.51 ml      Physical Exam    General: NAD Neck: JVP 14-16, no thyromegaly or thyroid nodule.  Lungs: Clear to auscultation bilaterally with normal respiratory effort. CV: Nondisplaced PMI.  Heart mildly tachy, irregular S1/S2, no S3/S4, no murmur.  No peripheral edema.  .  Abdomen: Soft, nontender, no hepatosplenomegaly, no distention.  Skin: Intact without lesions or rashes.  Neurologic: Alert and oriented x 3.  Psych: Normal affect. Extremities: No clubbing or cyanosis.  HEENT: Normal.   Telemetry   Atrial fibrillation rate 90s-100s (personally reviewed)  Labs    CBC Recent Labs    06/01/23 0507 06/02/23 0414   WBC 11.6* 10.1  HGB 11.4* 11.8*  HCT 33.7* 34.5*  MCV 86.2 85.0  PLT 168 168   Basic Metabolic Panel Recent Labs    08/65/78 0507 06/01/23 1722 06/02/23 0414  NA 127* 125* 127*  K 4.4 3.8 3.5  CL 95* 90* 92*  CO2 18* 17* 21*  GLUCOSE 276* 239* 179*  BUN 76* 83* 86*  CREATININE 2.49* 2.60* 2.99*  CALCIUM 7.9* 7.7* 7.6*  MG 2.2  --  2.2   Liver Function Tests Recent Labs    06/01/23 0507 06/02/23 0414  AST 2,704* 1,498*  ALT 1,514* 1,403*  ALKPHOS 47 50  BILITOT 1.1 0.9  PROT 5.8* 5.8*  ALBUMIN 3.0* 2.9*   No results for input(s): "LIPASE", "AMYLASE" in the last 72 hours. Cardiac Enzymes No results for input(s): "CKTOTAL", "CKMB", "CKMBINDEX", "TROPONINI" in the last 72 hours.  BNP: BNP (last 3 results) Recent Labs    03/08/23 1105 04/25/23 1151 05/30/23 1944  BNP 439.5* 1,246.8* 2,426.1*    ProBNP (last 3 results) No results for input(s): "PROBNP" in the last 8760 hours.   D-Dimer No results for input(s): "DDIMER" in the last 72 hours. Hemoglobin A1C No results for input(s): "HGBA1C" in the last 72 hours. Fasting Lipid Panel No results for input(s): "CHOL", "HDL", "LDLCALC", "TRIG", "CHOLHDL", "LDLDIRECT" in the last 72 hours. Thyroid Function Tests Recent Labs  05/30/23 2032  TSH 4.151    Other results:   Imaging    No results found.    Medications:     Scheduled Medications:  apixaban  2.5 mg Oral BID   Chlorhexidine Gluconate Cloth  6 each Topical Daily   feeding supplement  237 mL Oral BID BM   furosemide  80 mg Intravenous Once   mometasone-formoterol  2 puff Inhalation BID   potassium chloride  40 mEq Oral Once   potassium chloride  40 mEq Oral Once   pravastatin  40 mg Oral Daily   sodium chloride flush  10-40 mL Intracatheter Q12H   sodium chloride flush  3 mL Intravenous Q12H   tolvaptan  15 mg Oral Once    Infusions:  amiodarone 30 mg/hr (06/02/23 0700)   ceFEPime (MAXIPIME) IV Stopped (06/01/23 2135)    furosemide (LASIX) 200 mg in dextrose 5 % 100 mL (2 mg/mL) infusion 15 mg/hr (06/02/23 0700)   milrinone 0.25 mcg/kg/min (06/02/23 0700)    PRN Medications: acetaminophen **OR** acetaminophen, albuterol, sodium chloride flush, trimethobenzamide    Assessment/Plan   1. Acute on chronic systolic CHF: Cardiolite in 9/20 showed EF 43%, ischemic cardiomyopathy with anteroapical infarction.  In setting of AF/RVR in 2/25, TEE showed EF down to EF 20-25% with moderate RV dysfunction, mild-moderate MR.  Patient is now admitted with NYHA IV symptoms, cardiorenal syndrome with creatinine up to 2.57. 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  Significant volume overload on exam. Initial lactate 7.2, milrinone 0.25 started.  Lactate 1.9 on 3/15 with co-ox 71%.  This morning, co-ox lower at 54% and creatinine up to 2.99 but UOP is picking up and is clear.  SBP 90s.  Still with volume overload on exam, CVP 15.  - Increase milrinone to 0.375.  - I will add low fixed dose NE at 3 to keep BP a little higher for renal perfusion.  - Continue Lasix gtt 15 mg/hr, will give dose of tolvaptan 15 mg today with hyponatremia.  - He can continue [place] for now.  - Needs conversion to NSR (see below). - Not candidate for advanced therapies with age/frailty. With worsening renal function, I worry that he is nearing end stage. Will see how he does today but expect we will need to involve palliative care.  Would not be a good HD candidate.  - Narrow QRS, not CRT candidate.  2. Atrial fibrillation: Persistent.  Successful DCCV in 2/25 but back in AF/RVR by 05/24/23.  He has been on po amiodarone. 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 HR in 90s-100s this morning in AF.  - Continue amiodarone gtt 30 mg/hr for now.  - Continue apixaban.  - Aim for DCCV after diuresis. He has not missed any Eliquis  doses, does not need TEE.  3. CAD: H/o CABG 2008 with LIMA-LAD, seq SVG-OM1/2, SVG-PDA.  No cath since CABG.  Cardiolite in 9/20 with anteroapical infarction.  No chest pain.  HS-TnI minimally elevated with no trend, suspect demand ischemia from volume overload.  - Think we can avoid coronary angiography in absence of chest pain with AKI.  - Not on ASA given Eliquis.  - Continue home pravastatin 4. AKI on CKD stage 3:  Suspect cardiorenal syndrome with low output HF.  Initially hyperkalemic, now resolved.  Creatinine 2.49 => 2.99 today.  However, UOP has picked up and urine is clear.  - Support CO  with milrinone.  - Fixed low dose NE to run MAP higher.  - Would be poor HD candidate.  5. ID: Possible PNA on CXR.  Afebrile, WBCs 11.3 => 10.   - On empiric cefepime.  6. COPD: Prior smoker.  7. Hyponatremia: Hypervolemic hyponatremia, Na 125 today.  - Fluid restrict - Will give dose of tolvaptan 15 mg x 1 8. Elevated LFTs: Suspect shock liver. LFTs trending down.  - Follow LFTs.   Mobilize, PT/OT.   CRITICAL CARE Performed by: Marca Ancona  Total critical care time: 40 minutes  Critical care time was exclusive of separately billable procedures and treating other patients.  Critical care was necessary to treat or prevent imminent or life-threatening deterioration.  Critical care was time spent personally by me on the following activities: development of treatment plan with patient and/or surrogate as well as nursing, discussions with consultants, evaluation of patient's response to treatment, examination of patient, obtaining history from patient or surrogate, ordering and performing treatments and interventions, ordering and review of laboratory studies, ordering and review of radiographic studies, pulse oximetry and re-evaluation of patient's condition.   Length of Stay: 2  Marca Ancona, MD  06/02/2023, 7:33 AM  Advanced Heart Failure Team Pager [phone] (M-F; 7a - 5p)  Please  contact [facility] for night-coverage after hours (5p -7a ) and weekends on [email or web address]

## 2023-06-03 ENCOUNTER — Ambulatory Visit: Payer: Medicare Other | Admitting: Nurse Practitioner

## 2023-06-03 DIAGNOSIS — I4819 Other persistent atrial fibrillation: Secondary | ICD-10-CM | POA: Diagnosis not present

## 2023-06-03 DIAGNOSIS — I5023 Acute on chronic systolic (congestive) heart failure: Secondary | ICD-10-CM | POA: Diagnosis not present

## 2023-06-03 LAB — COOXEMETRY PANEL
Carboxyhemoglobin: 1.1 % (ref 0.5–1.5)
Carboxyhemoglobin: 1.1 % (ref 0.5–1.5)
Methemoglobin: 0.9 % (ref 0.0–1.5)
Methemoglobin: <0.7 % (ref 0.0–1.5)
O2 Saturation: 58.6 %
O2 Saturation: 70 %
Total hemoglobin: 12.9 g/dL (ref 12.0–16.0)
Total hemoglobin: 12.8 g/dL (ref 12.0–16.0)

## 2023-06-03 LAB — COMPREHENSIVE METABOLIC PANEL
ALT: 1253 U/L — ABNORMAL HIGH (ref 0–44)
AST: 822 U/L — ABNORMAL HIGH (ref 15–41)
Albumin: 3 g/dL — ABNORMAL LOW (ref 3.5–5.0)
Alkaline Phosphatase: 55 U/L (ref 38–126)
Anion gap: 14 (ref 5–15)
BUN: 88 mg/dL — ABNORMAL HIGH (ref 8–23)
CO2: 21 mmol/L — ABNORMAL LOW (ref 22–32)
Calcium: 7.8 mg/dL — ABNORMAL LOW (ref 8.9–10.3)
Chloride: 92 mmol/L — ABNORMAL LOW (ref 98–111)
Creatinine, Ser: 3.2 mg/dL — ABNORMAL HIGH (ref 0.61–1.24)
GFR, Estimated: 18 mL/min — ABNORMAL LOW (ref >60)
Glucose, Bld: 233 mg/dL — ABNORMAL HIGH (ref 70–99)
Potassium: 4.4 mmol/L (ref 3.5–5.1)
Sodium: 127 mmol/L — ABNORMAL LOW (ref 135–145)
Total Bilirubin: 1 mg/dL (ref 0.0–1.2)
Total Protein: 6.3 g/dL — ABNORMAL LOW (ref 6.5–8.1)

## 2023-06-03 LAB — CBC
HCT: 35.3 % — ABNORMAL LOW (ref 39.0–52.0)
Hemoglobin: 12.2 g/dL — ABNORMAL LOW (ref 13.0–17.0)
MCH: 28.8 pg (ref 26.0–34.0)
MCHC: 34.6 g/dL (ref 30.0–36.0)
MCV: 83.3 fL (ref 80.0–100.0)
Platelets: 204 10*3/uL (ref 150–400)
RBC: 4.24 MIL/uL (ref 4.22–5.81)
RDW: 13.3 % (ref 11.5–15.5)
WBC: 9 10*3/uL (ref 4.0–10.5)
nRBC: 0 % (ref 0.0–0.2)

## 2023-06-03 LAB — GLUCOSE, CAPILLARY
Glucose-Capillary: 218 mg/dL — ABNORMAL HIGH (ref 70–99)
Glucose-Capillary: 144 mg/dL — ABNORMAL HIGH (ref 70–99)
Glucose-Capillary: 115 mg/dL — ABNORMAL HIGH (ref 70–99)
Glucose-Capillary: 133 mg/dL — ABNORMAL HIGH (ref 70–99)
Glucose-Capillary: 144 mg/dL — ABNORMAL HIGH (ref 70–99)
Glucose-Capillary: 178 mg/dL — ABNORMAL HIGH (ref 70–99)
Glucose-Capillary: 156 mg/dL — ABNORMAL HIGH (ref 70–99)

## 2023-06-03 LAB — CG4 I-STAT (LACTIC ACID): Lactic Acid, Venous: 1 mmol/L (ref 0.5–1.9)

## 2023-06-03 LAB — MAGNESIUM: Magnesium: 2.3 mg/dL (ref 1.7–2.4)

## 2023-06-03 NOTE — Evaluation (Signed)
 Occupational Therapy Evaluation Patient Details Name: [name] MRN: [id number] DOB: 1941/03/09 Today's Date: 06/03/2023   History of Present Illness   [name] is a 83 y.o. male who presents with fatigue, shortness of breath, and abdominal bloating for the past week.  PMH: HTN, HLD, persistent a-fib, CAD s/p CABG, chronic systolic CHF, diabetes mellitus type 2, and COPD     Clinical Impressions PTA, pt lives with spouse in a multi-level home, typically ambulatory without AD and Independent in all ADLs, IADLs and driving. Pt presents now with minor deficits in dynamic standing balance. Overall, pt able to mobilize around unit using RW with CGA and minor cues for optimal DME use. Pt requires no more than CGA for ADLs. Provided initial education/suggestions on ADL modifications and DME uses to decrease fall risk. Also encouraged pt to implement an slower pace initially. Pending progression acutely, pt may need RW vs Rollator for home use. No OT needs anticipated at DC.     If plan is discharge home, recommend the following:   Assistance with cooking/housework;Help with stairs or ramp for entrance     Functional Status Assessment   Patient has had a recent decline in their functional status and demonstrates the ability to make significant improvements in function in a reasonable and predictable amount of time.     Equipment Recommendations   Other (comment) (RW vs Rollator pending education)     Recommendations for Other Services         Precautions/Restrictions   Precautions Precautions: Fall Precaution/Restrictions Comments: watch O2 Restrictions Weight Bearing Restrictions Per Provider Order: No     Mobility Bed Mobility               General bed mobility comments: in recliner    Transfers Overall transfer level: Needs assistance Equipment used: Rolling walker (2 wheels) Transfers: Sit to/from Stand Sit to Stand: Supervision                   Balance Overall balance assessment: Mild deficits observed, not formally tested                                         ADL either performed or assessed with clinical judgement   ADL Overall ADL's : Needs assistance/impaired Eating/Feeding: Independent;Sitting   Grooming: Set up;Standing   Upper Body Bathing: Set up;Sitting   Lower Body Bathing: Contact guard assist;Sitting/lateral leans;Sit to/from stand   Upper Body Dressing : Set up;Sitting   Lower Body Dressing: Contact guard assist;Sit to/from stand;Sitting/lateral leans   Toilet Transfer: Contact guard assist;Ambulation;Rolling walker (2 wheels)   Toileting- Clothing Manipulation and Hygiene: Contact guard assist;Sitting/lateral lean;Sit to/from stand       Functional mobility during ADLs: Contact guard assist;Rolling walker (2 wheels) General ADL Comments: Educated on slowing pace with mobility, performing LB dressing seated and consideration of shower chair to decrease fall risk. Pt reports he and his wife had been talking about getting a shower chair or modifying bathroom. educated that wheels could be purchased for RW at home vs obtaining this DME at DC     Vision Baseline Vision/History: 1 Wears glasses Ability to See in Adequate Light: 0 Adequate Patient Visual Report: No change from baseline Vision Assessment?: No apparent visual deficits;Wears glasses for reading     Perception         Praxis  Pertinent Vitals/Pain Pain Assessment Pain Assessment: No/denies pain     Extremity/Trunk Assessment Upper Extremity Assessment Upper Extremity Assessment: Overall WFL for tasks assessed;Right hand dominant   Lower Extremity Assessment Lower Extremity Assessment: Defer to PT evaluation   Cervical / Trunk Assessment Cervical / Trunk Assessment: Normal   Communication Communication Communication: No apparent difficulties   Cognition Arousal: Alert Behavior During Therapy:  WFL for tasks assessed/performed Cognition: No apparent impairments                               Following commands: Intact       Cueing  General Comments   Cueing Techniques: Verbal cues  Wife at bedside. delayed O2 readings. placed probe on ear with good pleth acheived 99-100% on RA   Exercises     Shoulder Instructions      Home Living Family/patient expects to be discharged to:: Private residence Living Arrangements: Spouse/significant other Available Help at Discharge: Family;Available 24 hours/day Type of Home: House Home Access: Stairs to enter [employer] of Steps: 5 Entrance Stairs-Rails: Right Home Layout: Bed/bath upstairs;Laundry or work area in Engineer, site of Steps: flight up to bedroom, flight down to laundry Alternate Level Stairs-Rails: Left;Right Bathroom Shower/Tub: Producer, television/film/video: Handicapped height     Home Equipment: Engineer, materials - single point   Additional Comments: DME was pt's late mother in law's      Prior Functioning/Environment Prior Level of Function : Independent/Modified Independent;Driving             Mobility Comments: driving, no AD ADLs Comments: Indep with ADLs, stands for showers. Drives, grocery shops    OT Problem List: Impaired balance (sitting and/or standing)   OT Treatment/Interventions: Self-care/ADL training;Therapeutic exercise;Energy conservation;DME and/or AE instruction;Therapeutic activities;Patient/family education      OT Goals(Current goals can be found in the care plan section)   Acute Rehab OT Goals Patient Stated Goal: go for a walk OT Goal Formulation: With patient/family Time For Goal Achievement: 06/17/23 Potential to Achieve Goals: Good   OT Frequency:  Min 1X/week    Co-evaluation              AM-PAC OT "6 Clicks" Daily Activity     Outcome Measure Help from another person eating meals?:  None Help from another person taking care of personal grooming?: A Little Help from another person toileting, which includes using toliet, bedpan, or urinal?: A Little Help from another person bathing (including washing, rinsing, drying)?: A Little Help from another person to put on and taking off regular upper body clothing?: A Little Help from another person to put on and taking off regular lower body clothing?: A Little 6 Click Score: 19   End of Session Equipment Utilized During Treatment: Gait belt;Rolling walker (2 wheels) Nurse Communication: Mobility status  Activity Tolerance: Patient tolerated treatment well Patient left: in chair;with call bell/phone within reach;with family/visitor present  OT Visit Diagnosis: Unsteadiness on feet (R26.81)                Time: 8295-6213 OT Time Calculation (min): 30 min Charges:  OT General Charges $OT Visit: 1 Visit OT Evaluation $OT Eval Low Complexity: 1 Low OT Treatments $Therapeutic Activity: 8-22 mins  [name], OTR/L Acute Rehab Services Office: [phone]   Lorre Munroe 06/03/2023, 11:13 AM

## 2023-06-03 NOTE — Progress Notes (Addendum)
 Patient ID: [name], male   DOB: Jan 23, 1941, 83 y.o.   MRN: [id number]     Advanced Heart Failure Rounding Note  Cardiologist: Chrystie Nose, MD  Chief Complaint: CHF Subjective:   Co-ox 59% this morning on milrinone 0.375 and NE @3 .   Creatinine 2.57 > 2.49 > 2.99> 3.2.  SBP 100s  UOP increased overnight, clear urine this morning.  I/Os net negative 2.7L on Lasix gtt 10 mg/hr + tolvaptan yesterday.  Na 127 today. CVP 9  AST 2704> 1498> 822 ALT 1514>1413>1253  Empiric cefepime for possible PNA. Afebrile, WBC 11.6 > 10.1>9.   In atrial fibrillation rate 110s, on amiodarone gtt and apixaban.   Patient feels better this morning. Sitting up in chair eating breakfast.   Objective:   Weight Range: 58.4 kg Body mass index is 20.78 kg/m.   Vital Signs:   Temp:  [96.1 F (35.6 C)-97.5 F (36.4 C)] 97.5 F (36.4 C) (03/17 0000) Pulse Rate:  [103-149] 124 (03/17 0700) Resp:  [16-27] 19 (03/17 0700) BP: (83-111)/(61-90) 94/69 (03/17 0700) SpO2:  [85 %-97 %] 92 % (03/17 0700) Weight:  [58.4 kg] 58.4 kg (03/17 0500) Last BM Date : 06/02/23  Weight change: Filed Weights   05/31/23 1128 06/02/23 0427 06/03/23 0500  Weight: 61.6 kg 61.4 kg 58.4 kg   Intake/Output:   Intake/Output Summary (Last 24 hours) at 06/03/2023 0918 Last data filed at 06/03/2023 0700 Gross per 24 hour  Intake 1482.33 ml  Output 4725 ml  Net -3242.67 ml    CVP 9 Physical Exam  General:  elderly appearing.  No respiratory difficulty HEENT: normal Neck: supple. JVD ~6 cm.  Cor: PMI nondisplaced. Irregular rate & rhythm. No rubs, gallops or murmurs. Lungs: clear Abdomen: soft, nontender, nondistended. Good bowel sounds. Extremities: no cyanosis, clubbing, rash, edema. PICC RUE GU: +foley Neuro: alert & oriented x 3. Moves all 4 extremities w/o difficulty. Affect pleasant.   Telemetry   A fib 110s (personally reviewed)  Labs    CBC Recent Labs    06/02/23 0414 06/03/23 0416  WBC 10.1  9.0  HGB 11.8* 12.2*  HCT 34.5* 35.3*  MCV 85.0 83.3  PLT 168 204   Basic Metabolic Panel Recent Labs    82/95/62 1552 06/03/23 0416  NA 122* 127*  K 4.2 4.4  CL 87* 92*  CO2 18* 21*  GLUCOSE 315* 233*  BUN 92* 88*  CREATININE 3.09* 3.20*  CALCIUM 7.6* 7.8*  MG 2.1 2.3   Liver Function Tests Recent Labs    06/02/23 1552 06/03/23 0416  AST 1,114* 822*  ALT 1,344* 1,253*  ALKPHOS 58 55  BILITOT 1.0 1.0  PROT 6.2* 6.3*  ALBUMIN 3.0* 3.0*   No results for input(s): "LIPASE", "AMYLASE" in the last 72 hours. Cardiac Enzymes No results for input(s): "CKTOTAL", "CKMB", "CKMBINDEX", "TROPONINI" in the last 72 hours.  BNP: BNP (last 3 results) Recent Labs    03/08/23 1105 04/25/23 1151 05/30/23 1944  BNP 439.5* 1,246.8* 2,426.1*    ProBNP (last 3 results) No results for input(s): "PROBNP" in the last 8760 hours.   D-Dimer No results for input(s): "DDIMER" in the last 72 hours. Hemoglobin A1C No results for input(s): "HGBA1C" in the last 72 hours. Fasting Lipid Panel No results for input(s): "CHOL", "HDL", "LDLCALC", "TRIG", "CHOLHDL", "LDLDIRECT" in the last 72 hours. Thyroid Function Tests No results for input(s): "TSH", "T4TOTAL", "T3FREE", "THYROIDAB" in the last 72 hours.  Invalid input(s): "FREET3"  Other results:  Imaging  DG Abd 1 View Result Date: 06/02/2023 CLINICAL DATA:  Ileus EXAM: ABDOMEN - 1 VIEW COMPARISON:  None Available. FINDINGS: 2 supine frontal views of the abdomen and pelvis are obtained. No bowel obstruction or ileus. Minimal retained stool throughout the colon. There are no masses or abnormal calcifications. Stable enlarged cardiac silhouette. Stable scattered bibasilar atelectasis. IMPRESSION: 1. Unremarkable bowel gas pattern. Electronically Signed   By: Sharlet Salina M.D.   On: 06/02/2023 15:25    Medications:   Scheduled Medications:  apixaban  2.5 mg Oral BID   Chlorhexidine Gluconate Cloth  6 each Topical Daily   feeding  supplement  237 mL Oral BID BM   insulin aspart  0-9 Units Subcutaneous Q4H   mometasone-formoterol  2 puff Inhalation BID   pravastatin  40 mg Oral Daily   sodium chloride flush  10-40 mL Intracatheter Q12H   sodium chloride flush  3 mL Intravenous Q12H    Infusions:  amiodarone 30 mg/hr (06/03/23 0700)   ceFEPime (MAXIPIME) IV Stopped (06/02/23 2301)   furosemide (LASIX) 200 mg in dextrose 5 % 100 mL (2 mg/mL) infusion 10 mg/hr (06/03/23 0700)   milrinone 0.375 mcg/kg/min (06/03/23 0700)   norepinephrine (LEVOPHED) Adult infusion 3 mcg/min (06/03/23 0700)    PRN Medications: acetaminophen **OR** acetaminophen, albuterol, alum & mag hydroxide-simeth, sodium chloride flush, trimethobenzamide  Assessment/Plan  1. Acute on chronic systolic CHF: Cardiolite in 9/20 showed EF 43%, ischemic cardiomyopathy with anteroapical infarction.  In setting of AF/RVR in 2/25, TEE showed EF down to EF 20-25% with moderate RV dysfunction, mild-moderate MR.  Patient is now admitted with NYHA IV symptoms, cardiorenal syndrome with creatinine up to 2.57. 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  Significant volume overload on admission. Initial lactate 7.2, milrinone 0.25 started.  Lactate 1.9 on 3/15 with co-ox 71%.  - This morning, co-ox 59% and creatinine up to 3.2. UOP continues to pick up and is clear. SBP 100s. Still mildly volume overloaded on exam, CVP 9.  - Continue Lasix gtt 10 mg/hr. (+tolvaptan yesterday). Suspect PO transition tomorrow.  - Continue milrinone 0.375 (increased yesterday with drop in co-ox).  - Now on low fixed dose NE at 3 to keep BP a little higher for renal perfusion. Can drop to 2 today, wean as tolerated.  [employer], can restart once foley removed.  - Needs conversion to NSR (see below). - Not candidate for advanced therapies with age/frailty. With worsening renal  function, I worry that he is nearing end stage. Will see how he does but expect we will need to involve palliative care.  Would not be a good HD candidate.  - Narrow QRS, not CRT candidate.   2. Atrial fibrillation: Persistent.  Successful DCCV in 2/25 but back in AF/RVR by 05/24/23.  He has been on po amiodarone. 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 HR 110s this morning in AF.  - Continue amiodarone gtt 30 mg/hr for now while on inotrope's - Continue apixaban.  - Aim for DCCV after diuresis. He has not missed any Eliquis doses, does not need TEE.   3. CAD: H/o CABG 2008 with LIMA-LAD, seq SVG-OM1/2, SVG-PDA.  No cath since CABG.  Cardiolite in 9/20 with anteroapical infarction.  No chest pain.  HS-TnI minimally elevated with no trend, suspect demand ischemia from volume overload.  - Think we can avoid coronary angiography in absence of chest pain with AKI.  -  Not on ASA given Eliquis.  - Continue home pravastatin  4. AKI on CKD stage 3:  Suspect cardiorenal syndrome with low output HF.  Initially hyperkalemic, now resolved.  Creatinine 2.49> 2.99> 3.2 today. UOP has picked up and urine is clear.  - Support CO with milrinone.  - Fixed low dose NE to run MAP higher.  - Would be poor HD candidate.   5. ID: Possible PNA on CXR.  Afebrile, WBCs 11.3 => 10.   - On empiric cefepime.   6. COPD: Prior smoker.   7. Hyponatremia: Hypervolemic hyponatremia - Fluid restrict - s/p Tolvaptan 3/16. Na 122>127  8. Elevated LFTs: Suspect shock liver. LFTs trending down.  - Follow LFTs.   Mobilize, PT/OT.   CRITICAL CARE Performed by: Alen Bleacher  Total critical care time: 15 minutes  Critical care time was exclusive of separately billable procedures and treating other patients.  Critical care was necessary to treat or prevent imminent or life-threatening deterioration.  Critical care was time spent personally by me on the following activities: development of  treatment plan with patient and/or surrogate as well as nursing, discussions with consultants, evaluation of patient's response to treatment, examination of patient, obtaining history from patient or surrogate, ordering and performing treatments and interventions, ordering and review of laboratory studies, ordering and review of radiographic studies, pulse oximetry and re-evaluation of patient's condition.   Length of Stay: 3  Alen Bleacher, NP  06/03/2023, 9:18 AM  Advanced Heart Failure Team Pager [phone] (M-F; 7a - 5p)  Please contact [facility] for night-coverage after hours (5p -7a ) and weekends on [email or web address]   Agree with above.   Remains on NE and milrinone. Co-ox marginal. Scr worse today. LFTs trending down slowly. SNa 127 after tolvaptan yesterday   Remains in AF with RVR despite IV amio   Feels weak. Denies CP or SOB. Poor appetite.   General:  Elderly male sitting in chair No resp difficulty HEENT: normal Neck: supple.JVP 10 . Carotids 2+ bilat; no bruits. No lymphadenopathy or thryomegaly appreciated. Cor: Irreg tachy +s3 Lungs: clear Abdomen: soft, nontender, nondistended. No hepatosplenomegaly. No bruits or masses. Good bowel sounds. Extremities: no cyanosis, clubbing, rash, tr edema Neuro: alert & orientedx3, cranial nerves grossly intact. moves all 4 extremities w/o difficulty. Affect pleasant  Remains critically ill with cardiogenic shock and multi-system organ failure with progressive LV dysfunction in setting of AF with RVR.  Will continue NE/milrinone support. Follow co-ox closely. Recheck lactic acid.   Continue diuresis today   If we can stabilize him will need eventual DC-CV  Discussed tenuous prognosis with him and his family. We are not out of the woods yet.   CRITICAL CARE Performed by: Arvilla Meres  Total critical care time: 45 minutes  Critical care time was exclusive of separately billable procedures and treating other  patients.  Critical care was necessary to treat or prevent imminent or life-threatening deterioration.  Critical care was time spent personally by me (independent of midlevel providers or residents) on the following activities: development of treatment plan with patient and/or surrogate as well as nursing, discussions with consultants, evaluation of patient's response to treatment, examination of patient, obtaining history from patient or surrogate, ordering and performing treatments and interventions, ordering and review of laboratory studies, ordering and review of radiographic studies, pulse oximetry and re-evaluation of patient's condition.  Arvilla Meres, MD  5:58 PM

## 2023-06-03 NOTE — Progress Notes (Signed)
 Pharmacy Antibiotic Note  [name] is a 83 y.o. male admitted on 05/30/2023 with pneumonia.  Pharmacy has been consulted for cefepime dosing.   Currently on day #4 of cefepime. WBC 9, afebrile. Scr increasing to 3.2 (CrCl 14 mL/min).   Plan: Continue cefepime 2g IV 24H F/u renal fxn, C&S, clinical status and peak/trough at [facility] De-escalate as able  Height: 5\' 6"  (167.6 cm) Weight: 58.4 kg (128 lb 12 oz) IBW/kg (Calculated) : 63.8  Temp (24hrs), Avg:96.8 F (36 C), Min:96.1 F (35.6 C), Max:97.5 F (36.4 C)  Recent Labs  Lab 05/30/23 1944 05/30/23 2032 05/31/23 1659 05/31/23 2118 06/01/23 0507 06/01/23 1722 06/02/23 0414 06/02/23 0933 06/02/23 1552 06/03/23 0416  WBC 7.5  --   --   --  11.6*  --  10.1  --   --  9.0  CREATININE  --    < > 2.44*   < > 2.49* 2.60* 2.99* 2.86* 3.09* 3.20*  LATICACIDVEN  --   --  7.2*  --  1.9  --   --   --   --   --    < > = values in this interval not displayed.    Estimated Creatinine Clearance: 14.2 mL/min (A) (by C-G formula based on SCr of 3.2 mg/dL (H)).    Allergies  Allergen Reactions   Other Shortness Of Breath and Other (See Comments)    Feline dander   Lisinopril Cough    Antimicrobials this admission: Vanc 3/14 x1 Cefepime 3/14>>  Dose adjustments this admission: N/A  Microbiology results: Bcx 3/14: ngtd  MRSA PCR 3/14: neg  Thank you for allowing pharmacy to participate in this patient's care,  Sherron Monday, PharmD, BCCCP Clinical Pharmacist  Phone: [phone] 06/03/2023 2:13 PM  Please check AMION for all [facility] Pharmacy phone numbers After 10:00 PM, call Main Pharmacy [phone]

## 2023-06-04 ENCOUNTER — Encounter (HOSPITAL_COMMUNITY): Payer: Medicare Other

## 2023-06-04 ENCOUNTER — Ambulatory Visit: Payer: Medicare Other | Admitting: Dermatology

## 2023-06-04 DIAGNOSIS — I5023 Acute on chronic systolic (congestive) heart failure: Secondary | ICD-10-CM | POA: Diagnosis not present

## 2023-06-04 DIAGNOSIS — I4819 Other persistent atrial fibrillation: Secondary | ICD-10-CM | POA: Diagnosis not present

## 2023-06-04 LAB — COOXEMETRY PANEL
Carboxyhemoglobin: 1.3 % (ref 0.5–1.5)
Methemoglobin: <0.7 % (ref 0.0–1.5)
O2 Saturation: 64.2 %
Total hemoglobin: 11.5 g/dL — ABNORMAL LOW (ref 12.0–16.0)

## 2023-06-04 LAB — CBC
HCT: 32.9 % — ABNORMAL LOW (ref 39.0–52.0)
Hemoglobin: 11.4 g/dL — ABNORMAL LOW (ref 13.0–17.0)
MCH: 28.6 pg (ref 26.0–34.0)
MCHC: 34.7 g/dL (ref 30.0–36.0)
MCV: 82.7 fL (ref 80.0–100.0)
Platelets: 181 10*3/uL (ref 150–400)
RBC: 3.98 MIL/uL — ABNORMAL LOW (ref 4.22–5.81)
RDW: 13.2 % (ref 11.5–15.5)
WBC: 7.9 10*3/uL (ref 4.0–10.5)
nRBC: 0 % (ref 0.0–0.2)

## 2023-06-04 LAB — COMPREHENSIVE METABOLIC PANEL
ALT: 1015 U/L — ABNORMAL HIGH (ref 0–44)
AST: 493 U/L — ABNORMAL HIGH (ref 15–41)
Albumin: 2.7 g/dL — ABNORMAL LOW (ref 3.5–5.0)
Alkaline Phosphatase: 54 U/L (ref 38–126)
Anion gap: 17 — ABNORMAL HIGH (ref 5–15)
BUN: 90 mg/dL — ABNORMAL HIGH (ref 8–23)
CO2: 22 mmol/L (ref 22–32)
Calcium: 7.5 mg/dL — ABNORMAL LOW (ref 8.9–10.3)
Chloride: 87 mmol/L — ABNORMAL LOW (ref 98–111)
Creatinine, Ser: 3.34 mg/dL — ABNORMAL HIGH (ref 0.61–1.24)
GFR, Estimated: 17 mL/min — ABNORMAL LOW (ref >60)
Glucose, Bld: 241 mg/dL — ABNORMAL HIGH (ref 70–99)
Potassium: 3.2 mmol/L — ABNORMAL LOW (ref 3.5–5.1)
Sodium: 126 mmol/L — ABNORMAL LOW (ref 135–145)
Total Bilirubin: 1.1 mg/dL (ref 0.0–1.2)
Total Protein: 5.9 g/dL — ABNORMAL LOW (ref 6.5–8.1)

## 2023-06-04 LAB — GLUCOSE, CAPILLARY
Glucose-Capillary: 118 mg/dL — ABNORMAL HIGH (ref 70–99)
Glucose-Capillary: 245 mg/dL — ABNORMAL HIGH (ref 70–99)
Glucose-Capillary: 214 mg/dL — ABNORMAL HIGH (ref 70–99)

## 2023-06-04 LAB — MAGNESIUM: Magnesium: 2.3 mg/dL (ref 1.7–2.4)

## 2023-06-04 MED ORDER — MUSCLE RUB 10-15 % EX CREA
TOPICAL_CREAM | CUTANEOUS | Status: DC | PRN
  Administered 2023-06-05: 1 via TOPICAL
  Filled 2023-06-04: qty 85

## 2023-06-04 MED ORDER — POTASSIUM CHLORIDE CRYS ER 20 MEQ PO TBCR
60.0000 meq | EXTENDED_RELEASE_TABLET | Freq: Once | ORAL | Status: DC
  Filled 2023-06-04: qty 3

## 2023-06-04 MED ORDER — POTASSIUM CHLORIDE CRYS ER 20 MEQ PO TBCR
40.0000 meq | EXTENDED_RELEASE_TABLET | Freq: Once | ORAL | Status: AC
  Administered 2023-06-04: 40 meq via ORAL

## 2023-06-04 MED ORDER — LIDOCAINE 5 % EX PTCH
1.0000 | MEDICATED_PATCH | CUTANEOUS | Status: DC
  Administered 2023-06-04 – 2023-06-09 (×7): 1 via TRANSDERMAL
  Filled 2023-06-04 (×7): qty 1

## 2023-06-04 MED ORDER — INSULIN ASPART 100 UNIT/ML IJ SOLN
0.0000 [IU] | Freq: Three times a day (TID) | INTRAMUSCULAR | Status: DC
  Administered 2023-06-04: 5 [IU] via SUBCUTANEOUS
  Administered 2023-06-04: 2 [IU] via SUBCUTANEOUS
  Administered 2023-06-05: 5 [IU] via SUBCUTANEOUS
  Administered 2023-06-05: 3 [IU] via SUBCUTANEOUS
  Administered 2023-06-05 – 2023-06-06 (×2): 5 [IU] via SUBCUTANEOUS
  Administered 2023-06-06 (×2): 3 [IU] via SUBCUTANEOUS
  Administered 2023-06-07: 2 [IU] via SUBCUTANEOUS
  Administered 2023-06-07 (×2): 5 [IU] via SUBCUTANEOUS
  Administered 2023-06-08 – 2023-06-09 (×4): 3 [IU] via SUBCUTANEOUS
  Administered 2023-06-09: 5 [IU] via SUBCUTANEOUS
  Administered 2023-06-09: 3 [IU] via SUBCUTANEOUS
  Administered 2023-06-10 (×2): 2 [IU] via SUBCUTANEOUS

## 2023-06-04 NOTE — Progress Notes (Signed)
 Physical Therapy Treatment Patient Details Name: JAYCION TREML MRN: [id number] DOB: 16-Dec-1940 Today's Date: 06/04/2023   History of Present Illness SABASTIAN RAIMONDI is a 83 y.o. male who presents with fatigue, shortness of breath, and abdominal bloating for the past week.  PMH: HTN, HLD, persistent a-fib, CAD s/p CABG, chronic systolic CHF, diabetes mellitus type 2, and COPD    PT Comments  Patient able to incr ambulation distance with use of RW and 2L O2 (300 ft). Patient required max cues for proximity to RW and for upright posture. Sats 96% throughout, HR 110-120.     If plan is discharge home, recommend the following: Assist for transportation;Help with stairs or ramp for entrance;A little help with walking and/or transfers;A little help with bathing/dressing/bathroom   Can travel by private vehicle        Equipment Recommendations  None recommended by PT (continue to assess)    Recommendations for Other Services       Precautions / Restrictions Precautions Precautions: Fall Precaution/Restrictions Comments: watch O2 Restrictions Weight Bearing Restrictions Per Provider Order: No     Mobility  Bed Mobility Overal bed mobility: Modified Independent                  Transfers Overall transfer level: Needs assistance Equipment used: Rolling walker (2 wheels) Transfers: Sit to/from Stand Sit to Stand: Supervision           General transfer comment: vc for safe use of RW    Ambulation/Gait Ambulation/Gait assistance: Contact guard assist Gait Distance (Feet): 300 Feet Assistive device: Rolling walker (2 wheels) Gait Pattern/deviations: Step-through pattern, Wide base of support, Trunk flexed Gait velocity: wfl     General Gait Details: use of RW with pt tending to push too far ahead (despite cues) with resulting trunk flexion (would correct briefly)   Stairs             Wheelchair Mobility     Tilt Bed    Modified Rankin (Stroke Patients  Only)       Balance Overall balance assessment: Mild deficits observed, not formally tested                                          Communication Communication Communication: No apparent difficulties  Cognition Arousal: Alert Behavior During Therapy: WFL for tasks assessed/performed   PT - Cognitive impairments: No apparent impairments                         Following commands: Intact      Cueing Cueing Techniques: Verbal cues  Exercises      General Comments General comments (skin integrity, edema, etc.): Wife present. HR 110-120 sats 96% on 2L BP 100/72      Pertinent Vitals/Pain Pain Assessment Pain Assessment: No/denies pain    Home Living                          Prior Function            PT Goals (current goals can now be found in the care plan section) Acute Rehab PT Goals Patient Stated Goal: home Time For Goal Achievement: 06/15/23 Potential to Achieve Goals: Good Progress towards PT goals: Progressing toward goals    Frequency    Min 2X/week  PT Plan      Co-evaluation              AM-PAC PT "6 Clicks" Mobility   Outcome Measure  Help needed turning from your back to your side while in a flat bed without using bedrails?: None Help needed moving from lying on your back to sitting on the side of a flat bed without using bedrails?: None Help needed moving to and from a bed to a chair (including a wheelchair)?: A Little Help needed standing up from a chair using your arms (e.g., wheelchair or bedside chair)?: A Little Help needed to walk in hospital room?: A Little Help needed climbing 3-5 steps with a railing? : A Lot 6 Click Score: 19    End of Session Equipment Utilized During Treatment: Gait belt Activity Tolerance: Patient tolerated treatment well Patient left: in chair;with call bell/phone within reach;with chair alarm set;with family/visitor present   PT Visit Diagnosis:  Unsteadiness on feet (R26.81);Muscle weakness (generalized) (M62.81)     Time: 4098-1191 PT Time Calculation (min) (ACUTE ONLY): 24 min  Charges:    $Gait Training: 23-37 mins PT General Charges $$ ACUTE PT VISIT: 1 Visit                      Jerolyn Center, PT Acute Rehabilitation Services  Office [phone]    Zena Amos 06/04/2023, 3:32 PM

## 2023-06-04 NOTE — Progress Notes (Addendum)
 Patient ID: [name], male   DOB: [birth year], 83 y.o.   MRN: [id number]     Advanced Heart Failure Rounding Note  Cardiologist: Chrystie Nose, MD  Chief Complaint: CHF Subjective:   Co-ox 64% this morning on milrinone 0.375 and NE @3 .   Creatinine 2.57 > 2.49 > 2.99> 3.2>3.34.  SBP 80s-90s  Diuresed well with lasix gtt.  I/Os net negative .  Na 126 today. CVP 9. Lasix gtt stopped early this morning.   AST 2704> 1498> 822> 493 ALT 1514>1413>1253> 1K  Empiric cefepime for possible PNA. Afebrile, WBC 11.6 > 10.1>9>7.9.   In atrial fibrillation rate 90s-low 100s, on amiodarone gtt and apixaban.   Feels ok just sleepy this morning.   Objective:   Weight Range: 60.2 kg Body mass index is 21.42 kg/m.   Vital Signs:   Temp:  [97.5 F (36.4 C)-98.2 F (36.8 C)] 97.5 F (36.4 C) (03/18 0756) Pulse Rate:  [100-144] 107 (03/18 0815) Resp:  [11-25] 20 (03/18 0815) BP: (66-117)/(36-82) 86/69 (03/18 0815) SpO2:  [82 %-100 %] 96 % (03/18 0815) Weight:  [60.2 kg] 60.2 kg (03/18 0500) Last BM Date : 06/01/23  Weight change: Filed Weights   06/02/23 0427 06/03/23 0500 06/04/23 0500  Weight: 61.4 kg 58.4 kg 60.2 kg   Intake/Output:   Intake/Output Summary (Last 24 hours) at 06/04/2023 0847 Last data filed at 06/04/2023 0800 Gross per 24 hour  Intake 2058.28 ml  Output 2504 ml  Net -445.72 ml    CVP 9 Physical Exam  General:  elderly appearing.  No respiratory difficulty Neck: supple. JVD ~6 cm.  Cor: PMI nondisplaced. Regular rate & irregular rhythm. No rubs, gallops or murmurs. Lungs: clear Abdomen: soft, nontender, nondistended. Good bowel sounds. Extremities: no cyanosis, clubbing, rash, edema. PICC RUE GU: +foley Neuro: alert & oriented x 3. Moves all 4 extremities w/o difficulty. Affect pleasant.   Telemetry   A fib 90s-low 100s (personally reviewed)  Labs    CBC Recent Labs    06/03/23 0416 06/04/23 0408  WBC 9.0 7.9  HGB 12.2* 11.4*  HCT 35.3*  32.9*  MCV 83.3 82.7  PLT 204 181   Basic Metabolic Panel Recent Labs    40/98/11 0416 06/04/23 0408  NA 127* 126*  K 4.4 3.2*  CL 92* 87*  CO2 21* 22  GLUCOSE 233* 241*  BUN 88* 90*  CREATININE 3.20* 3.34*  CALCIUM 7.8* 7.5*  MG 2.3 2.3   Liver Function Tests Recent Labs    06/03/23 0416 06/04/23 0408  AST 822* 493*  ALT 1,253* 1,015*  ALKPHOS 55 54  BILITOT 1.0 1.1  PROT 6.3* 5.9*  ALBUMIN 3.0* 2.7*   No results for input(s): "LIPASE", "AMYLASE" in the last 72 hours. Cardiac Enzymes No results for input(s): "CKTOTAL", "CKMB", "CKMBINDEX", "TROPONINI" in the last 72 hours.  BNP: BNP (last 3 results) Recent Labs    03/08/23 1105 04/25/23 1151 05/30/23 1944  BNP 439.5* 1,246.8* 2,426.1*    ProBNP (last 3 results) No results for input(s): "PROBNP" in the last 8760 hours.   D-Dimer No results for input(s): "DDIMER" in the last 72 hours. Hemoglobin A1C No results for input(s): "HGBA1C" in the last 72 hours. Fasting Lipid Panel No results for input(s): "CHOL", "HDL", "LDLCALC", "TRIG", "CHOLHDL", "LDLDIRECT" in the last 72 hours. Thyroid Function Tests No results for input(s): "TSH", "T4TOTAL", "T3FREE", "THYROIDAB" in the last 72 hours.  Invalid input(s): "FREET3"  Other results:  Imaging  No results found.  Medications:   Scheduled Medications:  apixaban  2.5 mg Oral BID   Chlorhexidine Gluconate Cloth  6 each Topical Daily   feeding supplement  237 mL Oral BID BM   insulin aspart  0-9 Units Subcutaneous Q4H   mometasone-formoterol  2 puff Inhalation BID   pravastatin  40 mg Oral Daily   sodium chloride flush  10-40 mL Intracatheter Q12H   sodium chloride flush  3 mL Intravenous Q12H    Infusions:  amiodarone 60 mg/hr (06/04/23 0800)   ceFEPime (MAXIPIME) IV Stopped (06/03/23 2145)   milrinone 0.375 mcg/kg/min (06/04/23 0800)   norepinephrine (LEVOPHED) Adult infusion 3 mcg/min (06/04/23 0800)    PRN Medications: acetaminophen **OR**  acetaminophen, albuterol, alum & mag hydroxide-simeth, sodium chloride flush, trimethobenzamide  Assessment/Plan  1. Acute on chronic systolic CHF: Cardiolite in 9/20 showed EF 43%, ischemic cardiomyopathy with anteroapical infarction.  In setting of AF/RVR in 2/25, TEE showed EF down to EF 20-25% with moderate RV dysfunction, mild-moderate MR.  Patient is now admitted with NYHA IV symptoms, cardiorenal syndrome with creatinine up to 2.57. 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  Significant volume overload on admission. Initial lactate 7.2, milrinone 0.25 started.  Lactate 1.9 on 3/15 with co-ox 71%.  - This morning, co-ox 64% and creatinine up to 3.34. -2.3L UOP. SBP 80s-90s. Volume relatively stable. Lasix gtt stopped early this morning. CVP 9. Would hold diuretics for now. Will reevaluate CVP this afternoon and start PO lasix if needed.  - Continue milrinone 0.375 - Now on NE at 3 to keep BP a little higher for renal perfusion. Can wean as tolerated.  [employer], can restart once foley removed.  - Needs conversion to NSR (see below). - Not candidate for advanced therapies with age/frailty. With worsening renal function, I worry that he is nearing end stage. Will see how he does but expect we will need to involve palliative care.  Would not be a good HD candidate.  - Narrow QRS, not CRT candidate.   2. Atrial fibrillation: Persistent.  Successful DCCV in 2/25 but back in AF/RVR by 05/24/23.  He has been on po amiodarone. 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 HR 110s this morning in AF.  - Decrease amiodarone gtt 60>30 mg/hr for now while on inotrope's - Continue apixaban.  - Aim for DCCV after diuresis. He has not missed any Eliquis doses, does not need TEE.   3. CAD: H/o CABG 2008 with LIMA-LAD, seq SVG-OM1/2, SVG-PDA.  No cath since CABG.  Cardiolite in  9/20 with anteroapical infarction.  No chest pain.  HS-TnI minimally elevated with no trend, suspect demand ischemia from volume overload.  - Think we can avoid coronary angiography in absence of chest pain with AKI.  - Not on ASA given Eliquis.  - Continue home pravastatin  4. AKI on CKD stage 3:  Suspect cardiorenal syndrome with low output HF.  Initially hyperkalemic, now resolved.  Creatinine 2.49> 2.99> 3.2> 3.3 today. UOP slowed down - Support CO with milrinone.  - Fixed low dose NE to run MAP higher.  - Would be poor HD candidate.   5. ID: Possible PNA on CXR.  Afebrile, WBCs 11.3> 10> 7.9.   - On empiric cefepime, last day today.   6. COPD: Prior smoker.   7. Hyponatremia: Hypervolemic hyponatremia - Fluid restrict - s/p Tolvaptan 3/16. Na 122>127>126  8. Elevated LFTs: Suspect shock  liver. LFTs trending down.  - Follow LFTs.   Mobilize, PT/OT.   CRITICAL CARE Performed by: Alen Bleacher  Total critical care time: 14 minutes  Critical care time was exclusive of separately billable procedures and treating other patients.  Critical care was necessary to treat or prevent imminent or life-threatening deterioration.  Critical care was time spent personally by me on the following activities: development of treatment plan with patient and/or surrogate as well as nursing, discussions with consultants, evaluation of patient's response to treatment, examination of patient, obtaining history from patient or surrogate, ordering and performing treatments and interventions, ordering and review of laboratory studies, ordering and review of radiographic studies, pulse oximetry and re-evaluation of patient's condition.   Length of Stay: 4  Alen Bleacher, NP  06/04/2023, 8:47 AM  Advanced Heart Failure Team Pager [phone] (M-F; 7a - 5p)  Please contact [facility] for night-coverage after hours (5p -7a ) and weekends on [email or web address]  Agree with above.  Remains on NE 3  and milrinone  0.375. Co-ox 64% CVP 9 SBP running 70-90   In AF Rates low 100s on amio gtt. On Eliquis  SNa 126  Scr up to 3.3  Fatigued. Denies CP or SOB.  General: Elderly No resp difficulty HEENT: normal Neck: supple. JVP 9 Cor: Irregular rate & rhythm. No rubs, gallops or murmurs. Lungs: clear Abdomen: soft, nontender, nondistended. No hepatosplenomegaly. No bruits or masses. Good bowel sounds. Extremities: no cyanosis, clubbing, rash, tr edema Neuro: alert & orientedx3, cranial nerves grossly intact. moves all 4 extremities w/o difficulty. Affect pleasant  He remains extremely tenuous with MSOF and persistent hypotension. Renal function worsening.   Hold lasix. Use NE to keep SBP consistently > 90  If recovers will need eventual DC-CV  CRITICAL CARE Performed by: Arvilla Meres  Total critical care time: 45 minutes  Critical care time was exclusive of separately billable procedures and treating other patients.  Critical care was necessary to treat or prevent imminent or life-threatening deterioration.  Critical care was time spent personally by me (independent of midlevel providers or residents) on the following activities: development of treatment plan with patient and/or surrogate as well as nursing, discussions with consultants, evaluation of patient's response to treatment, examination of patient, obtaining history from patient or surrogate, ordering and performing treatments and interventions, ordering and review of laboratory studies, ordering and review of radiographic studies, pulse oximetry and re-evaluation of patient's condition.  Arvilla Meres, MD  10:11 AM

## 2023-06-05 DIAGNOSIS — I5023 Acute on chronic systolic (congestive) heart failure: Secondary | ICD-10-CM | POA: Diagnosis not present

## 2023-06-05 DIAGNOSIS — E43 Unspecified severe protein-calorie malnutrition: Secondary | ICD-10-CM | POA: Insufficient documentation

## 2023-06-05 DIAGNOSIS — I4819 Other persistent atrial fibrillation: Secondary | ICD-10-CM | POA: Diagnosis not present

## 2023-06-05 LAB — BASIC METABOLIC PANEL
Anion gap: 11 (ref 5–15)
Anion gap: 16 — ABNORMAL HIGH (ref 5–15)
BUN: 95 mg/dL — ABNORMAL HIGH (ref 8–23)
BUN: 100 mg/dL — ABNORMAL HIGH (ref 8–23)
CO2: 25 mmol/L (ref 22–32)
CO2: 23 mmol/L (ref 22–32)
Calcium: 7.4 mg/dL — ABNORMAL LOW (ref 8.9–10.3)
Calcium: 8.2 mg/dL — ABNORMAL LOW (ref 8.9–10.3)
Chloride: 85 mmol/L — ABNORMAL LOW (ref 98–111)
Chloride: 85 mmol/L — ABNORMAL LOW (ref 98–111)
Creatinine, Ser: 3.61 mg/dL — ABNORMAL HIGH (ref 0.61–1.24)
Creatinine, Ser: 3.92 mg/dL — ABNORMAL HIGH (ref 0.61–1.24)
GFR, Estimated: 16 mL/min — ABNORMAL LOW (ref >60)
GFR, Estimated: 14 mL/min — ABNORMAL LOW (ref >60)
Glucose, Bld: 309 mg/dL — ABNORMAL HIGH (ref 70–99)
Glucose, Bld: 184 mg/dL — ABNORMAL HIGH (ref 70–99)
Potassium: 3.5 mmol/L (ref 3.5–5.1)
Potassium: 4.6 mmol/L (ref 3.5–5.1)
Sodium: 121 mmol/L — ABNORMAL LOW (ref 135–145)
Sodium: 124 mmol/L — ABNORMAL LOW (ref 135–145)

## 2023-06-05 LAB — COOXEMETRY PANEL
Carboxyhemoglobin: 1.4 % (ref 0.5–1.5)
Methemoglobin: <0.7 % (ref 0.0–1.5)
O2 Saturation: 66.4 %
Total hemoglobin: 11.6 g/dL — ABNORMAL LOW (ref 12.0–16.0)

## 2023-06-05 LAB — COMPREHENSIVE METABOLIC PANEL
ALT: 786 U/L — ABNORMAL HIGH (ref 0–44)
AST: 264 U/L — ABNORMAL HIGH (ref 15–41)
Albumin: 2.7 g/dL — ABNORMAL LOW (ref 3.5–5.0)
Alkaline Phosphatase: 47 U/L (ref 38–126)
Anion gap: 15 (ref 5–15)
BUN: 92 mg/dL — ABNORMAL HIGH (ref 8–23)
CO2: 23 mmol/L (ref 22–32)
Calcium: 7.5 mg/dL — ABNORMAL LOW (ref 8.9–10.3)
Chloride: 80 mmol/L — ABNORMAL LOW (ref 98–111)
Creatinine, Ser: 3.52 mg/dL — ABNORMAL HIGH (ref 0.61–1.24)
GFR, Estimated: 16 mL/min — ABNORMAL LOW (ref >60)
Glucose, Bld: 444 mg/dL — ABNORMAL HIGH (ref 70–99)
Potassium: 3.4 mmol/L — ABNORMAL LOW (ref 3.5–5.1)
Sodium: 118 mmol/L — CL (ref 135–145)
Total Bilirubin: 1 mg/dL (ref 0.0–1.2)
Total Protein: 6.2 g/dL — ABNORMAL LOW (ref 6.5–8.1)

## 2023-06-05 LAB — CULTURE, BLOOD (ROUTINE X 2)
Culture: NO GROWTH
Culture: NO GROWTH
Special Requests: ADEQUATE

## 2023-06-05 LAB — CBC
HCT: 32.7 % — ABNORMAL LOW (ref 39.0–52.0)
Hemoglobin: 11.3 g/dL — ABNORMAL LOW (ref 13.0–17.0)
MCH: 28.8 pg (ref 26.0–34.0)
MCHC: 34.6 g/dL (ref 30.0–36.0)
MCV: 83.2 fL (ref 80.0–100.0)
Platelets: 176 10*3/uL (ref 150–400)
RBC: 3.93 MIL/uL — ABNORMAL LOW (ref 4.22–5.81)
RDW: 13.2 % (ref 11.5–15.5)
WBC: 7.7 10*3/uL (ref 4.0–10.5)
nRBC: 0.3 % — ABNORMAL HIGH (ref 0.0–0.2)

## 2023-06-05 LAB — GLUCOSE, CAPILLARY
Glucose-Capillary: 131 mg/dL — ABNORMAL HIGH (ref 70–99)
Glucose-Capillary: 244 mg/dL — ABNORMAL HIGH (ref 70–99)
Glucose-Capillary: 229 mg/dL — ABNORMAL HIGH (ref 70–99)
Glucose-Capillary: 159 mg/dL — ABNORMAL HIGH (ref 70–99)
Glucose-Capillary: 202 mg/dL — ABNORMAL HIGH (ref 70–99)

## 2023-06-05 LAB — LACTIC ACID, PLASMA
Lactic Acid, Venous: 1.3 mmol/L (ref 0.5–1.9)
Lactic Acid, Venous: 1.1 mmol/L (ref 0.5–1.9)

## 2023-06-05 LAB — MAGNESIUM: Magnesium: 2.2 mg/dL (ref 1.7–2.4)

## 2023-06-05 MED ORDER — INSULIN GLARGINE 100 UNIT/ML ~~LOC~~ SOLN
4.0000 [IU] | Freq: Every day | SUBCUTANEOUS | Status: DC
  Administered 2023-06-05 – 2023-06-07 (×3): 4 [IU] via SUBCUTANEOUS
  Filled 2023-06-05 (×4): qty 0.04

## 2023-06-05 MED ORDER — POTASSIUM CHLORIDE CRYS ER 20 MEQ PO TBCR
40.0000 meq | EXTENDED_RELEASE_TABLET | Freq: Once | ORAL | Status: AC
  Administered 2023-06-05: 40 meq via ORAL
  Filled 2023-06-05: qty 2

## 2023-06-05 MED ORDER — POLYETHYLENE GLYCOL 3350 17 G PO PACK
17.0000 g | PACK | Freq: Every day | ORAL | Status: DC
  Administered 2023-06-05 – 2023-06-10 (×5): 17 g via ORAL
  Filled 2023-06-05 (×6): qty 1

## 2023-06-05 MED ORDER — MIDODRINE HCL 5 MG PO TABS: 5.0000 mg | ORAL_TABLET | Freq: Three times a day (TID) | ORAL | Status: DC

## 2023-06-05 MED ORDER — SENNOSIDES-DOCUSATE SODIUM 8.6-50 MG PO TABS
1.0000 | ORAL_TABLET | Freq: Two times a day (BID) | ORAL | Status: DC
  Administered 2023-06-05 – 2023-06-10 (×7): 1 via ORAL
  Filled 2023-06-05 (×7): qty 1

## 2023-06-05 MED ORDER — ENSURE ENLIVE PO LIQD
237.0000 mL | Freq: Three times a day (TID) | ORAL | Status: DC
  Administered 2023-06-06 – 2023-06-07 (×4): 237 mL via ORAL

## 2023-06-05 MED ORDER — TOLVAPTAN 15 MG PO TABS
15.0000 mg | ORAL_TABLET | Freq: Once | ORAL | Status: AC
  Administered 2023-06-05: 15 mg via ORAL
  Filled 2023-06-05: qty 1

## 2023-06-05 NOTE — Progress Notes (Signed)
 Physical Therapy Treatment Patient Details Name: [name] MRN: [id number] DOB: [birth year] Today's Date: 06/05/2023   History of Present Illness [name] is a 83 y.o. male who presents with fatigue, shortness of breath, and abdominal bloating for the past week.  PMH: HTN, HLD, persistent a-fib, CAD s/p CABG, chronic systolic CHF, diabetes mellitus type 2, and COPD   PT Comments  Pt in bed upon arrival and agreeable to PT session. Pt was able to stand and ambulate with CGA and use of rollator. Pt was able to ambulate ~250 ft with cues for safety with rollator and technique. Pt needed frequent cues for how to lock/unlock brakes and to keep the rollator close by when turning and sitting. He reported preferring using the rollator as compared to a RW. Pt declined working on stair negotiation with agreement to practice in future sessions. Pt is progressing well towards goals. Acute PT to follow.   BP prior to session- 84/61 BP after session- 92/73, 81 HR 111 BPM; SpO2 >98% on 2L    If plan is discharge home, recommend the following: Assist for transportation;Help with stairs or ramp for entrance;A little help with walking and/or transfers;A little help with bathing/dressing/bathroom   Can travel by private vehicle      Yes  Equipment Recommendations  Rollator (4 wheels)       Precautions / Restrictions Precautions Precautions: Fall Precaution/Restrictions Comments: watch O2 Restrictions Weight Bearing Restrictions Per Provider Order: No     Mobility  Bed Mobility Overal bed mobility: Modified Independent    General bed mobility comments: with HOB elevated    Transfers Overall transfer level: Needs assistance Equipment used: Rollator (4 wheels) Transfers: Sit to/from Stand Sit to Stand: Contact guard assist    General transfer comment: cues for hand placement and to lock/unlock brakes. Cues to keep rollator close by before sitting as pt tends to leave it behind     Ambulation/Gait Ambulation/Gait assistance: Contact guard assist Gait Distance (Feet): 250 Feet Assistive device: Rollator (4 wheels) Gait Pattern/deviations: Step-through pattern, Trunk flexed Gait velocity: wfl     General Gait Details: cues for rollator proximity, short steady steps with no LOB        Balance Overall balance assessment: Mild deficits observed, not formally tested       Communication Communication Communication: No apparent difficulties  Cognition Arousal: Alert Behavior During Therapy: WFL for tasks assessed/performed   PT - Cognitive impairments: No apparent impairments    Following commands: Intact      Cueing Cueing Techniques: Verbal cues  Exercises General Exercises - Lower Extremity Long Arc Quad: AROM, Both, 10 reps, Seated Hip Flexion/Marching: AROM, Both, 10 reps, Seated        Pertinent Vitals/Pain Pain Assessment Pain Assessment: No/denies pain     PT Goals (current goals can now be found in the care plan section) Acute Rehab PT Goals Patient Stated Goal: home PT Goal Formulation: With patient Time For Goal Achievement: 06/15/23 Potential to Achieve Goals: Good Progress towards PT goals: Progressing toward goals    Frequency    Min 2X/week       AM-PAC PT "6 Clicks" Mobility   Outcome Measure  Help needed turning from your back to your side while in a flat bed without using bedrails?: None Help needed moving from lying on your back to sitting on the side of a flat bed without using bedrails?: None Help needed moving to and from a bed to a chair (including a  wheelchair)?: A Little Help needed standing up from a chair using your arms (e.g., wheelchair or bedside chair)?: A Little Help needed to walk in hospital room?: A Little Help needed climbing 3-5 steps with a railing? : A Lot 6 Click Score: 19    End of Session Equipment Utilized During Treatment: Gait belt;Oxygen Activity Tolerance: Patient tolerated  treatment well Patient left: with call bell/phone within reach;with family/visitor present;in bed Nurse Communication: Mobility status PT Visit Diagnosis: Unsteadiness on feet (R26.81);Muscle weakness (generalized) (M62.81)     Time: 9629-5284 PT Time Calculation (min) (ACUTE ONLY): 25 min  Charges:    $Gait Training: 8-22 mins $Therapeutic Exercise: 8-22 mins PT General Charges $$ ACUTE PT VISIT: 1 Visit                     [name], PT, DPT Secure Chat Preferred  Rehab Office [phone]    Arturo Morton Brion Aliment 06/05/2023, 12:28 PM

## 2023-06-05 NOTE — TOC Initial Note (Signed)
 Transition of Care [facility]) - Initial/Assessment Note    Patient Details  Name: [name] MRN: [id number] Date of Birth: 03-Jun-1940  [facility]) CM/SW Contact:    Elliot Cousin, RN Phone Number: [phone] 06/05/2023, 9:36 AM  Clinical Narrative:                  TOC CM spoke to pt at bedside along with wife. Gave permission to speak to wife. States he was independent pta. Has a RW at home but he does not use it currently. Has a scale for daily weights. Will continue to follow for dc needs.    Expected Discharge Plan: Home/Self Care Barriers to Discharge: Continued Medical Work up   Patient Goals and CMS Choice Patient states their goals for this hospitalization and ongoing recovery are:: wants to remain independent          Expected Discharge Plan and Services   Discharge Planning Services: CM Consult   Living arrangements for the past 2 months: Single Family Home                                      Prior Living Arrangements/Services Living arrangements for the past 2 months: Single Family Home Lives with:: Spouse Patient language and need for interpreter reviewed:: Yes Do you feel safe going back to the place where you live?: Yes      Need for Family Participation in Patient Care: No (Comment) Care giver support system in place?: Yes (comment) Current home services: DME Adult nurse) Criminal Activity/Legal Involvement Pertinent to Current Situation/Hospitalization: No - Comment as needed  Activities of Daily Living   ADL Screening (condition at time of admission) Independently performs ADLs?: Yes (appropriate for developmental age) Is the patient deaf or have difficulty hearing?: Yes Does the patient have difficulty seeing, even when wearing glasses/contacts?: No Does the patient have difficulty concentrating, remembering, or making decisions?: No  Permission Sought/Granted Permission sought to share information with : Case  Manager, Family Supports, PCP Permission granted to share information with : Yes, Verbal Permission Granted              Emotional Assessment Appearance:: Appears stated age Attitude/Demeanor/Rapport: Engaged Affect (typically observed): Accepting Orientation: : Oriented to Self, Oriented to Place, Oriented to  Time, Oriented to Situation   Psych Involvement: No (comment)  Admission diagnosis:  Atrial fibrillation with RVR (HCC) [I48.91] HCAP (healthcare-associated pneumonia) [J18.9] Patient Active Problem List   Diagnosis Date Noted   Atrial fibrillation with RVR (HCC) 05/31/2023   Hyperkalemia 05/31/2023   HFrEF (heart failure with reduced ejection fraction) (HCC) 05/31/2023   Acute kidney injury superimposed on chronic kidney disease (HCC) 05/31/2023   Elevated troponin 05/31/2023   SIRS (systemic inflammatory response syndrome) (HCC) 05/31/2023   Atrial flutter (HCC) 05/28/2023   Hypercoagulable state due to persistent atrial fibrillation (HCC) 05/28/2023   Persistent atrial fibrillation (HCC) 04/25/2023   SOB (shortness of breath) 04/25/2023   Acute on chronic heart failure with preserved ejection fraction (HCC) 04/25/2023   Respiratory failure with hypoxia secondary to RSV 03/08/2023   RSV (respiratory syncytial virus pneumonia) 03/08/2023   Paroxysmal atrial fibrillation (HCC) 03/08/2023   Allergic rhinitis 12/22/2021   Healthcare maintenance 12/04/2019   Abnormal finding on lung imaging 05/14/2018   Former smoker 05/14/2018   Acute exacerbation of COPD with asthma (HCC) 04/02/2018   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   Cardiomyopathy, 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   Hypertriglyceridemia 02/03/2016   Prostate cancer (HCC) 02/03/2016   Essential hypertension 02/03/2016   Environmental allergies 02/03/2016   CAD (coronary artery disease) 02/05/2013   CAD S/P CABG x 4 in 2008 02/05/2013   Controlled type 2 diabetes mellitus  without complication, without long-term current use of insulin (HCC) 02/05/2013   Dyslipidemia 02/05/2013   PCP:  Merri Brunette, MD Pharmacy:   [facility] [phone] - [place], [place] - [address] AT [facility] OF [address] & [address] [place] [address] Phone: [phone] Fax: [phone]  [facility] [address] [place] [address] Phone: [phone] Fax: [phone]     Social Drivers of Health (SDOH) Social History: SDOH Screenings   Food Insecurity: No Food Insecurity (05/31/2023)  Housing: Low Risk  (05/31/2023)  Transportation Needs: No Transportation Needs (05/31/2023)  Utilities: Not At Risk (05/31/2023)  Depression (PHQ2-9): Low Risk  (04/01/2023)  Social Connections: Moderately Isolated (05/31/2023)  Tobacco Use: Medium Risk (05/31/2023)   SDOH Interventions:     Readmission Risk Interventions     No data to display

## 2023-06-05 NOTE — Progress Notes (Signed)
 On-call MD Cherly Beach, MD) notified at [phone] for increasing pressor requirements and to have the ceiling increased for the Levophed. New orders placed. A.M. labs were drawn early as per verbal order. A critical Na+ (118) was reported to Mulga, MD at 0500 and the Glucose was elevated at 444 on that lab as well, from previous finger stick draws which were in the 100's-200's. A order was given per Cherly Beach, MD to redraw the Na on iSTAT which resulted at 122. MD made aware that the carrier fluids through PICC have dextrose and one of them cannot be paused at this time, leading to inaccurately elevated glucose levels. A finger stick glucose was taken and resulted at 229 that level was treated as per MAR. The patient was made to be a phlebotomy draw for future lab work for better accuracy with levels due to the Levophed not being able to be paused for lab draws at this time.

## 2023-06-05 NOTE — Progress Notes (Signed)
 Paged with pressor requirements increasing from levophed 7 mcg to 10 mcg from shift change to early morning to maintain sBP >90 and MAP >65. Increased parameters to allow levophed to 15 mcg. Obtained AM labs early. Notable for severe hypoNa and hyperglycemia. Has had hypoNa on 03/16 and given tolvaptan x2 (15/30 mg) with improvement. Asked to recheck iStat Na and returned at 122 however BG 444 on CMP. Amio with carrier D5 through PICC and and labs obtained there. Paused amio and nurse to get finger stick BG as well as repeat BMP through central line to assess for pseudo hypoNa. Patient resting and asx.

## 2023-06-05 NOTE — TOC Progression Note (Signed)
 Transition of Care [facility]) - Progression Note    Patient Details  Name: [name] MRN: [id number] Date of Birth: 03-08-1941  Transition [facility] Care [facility]) CM/SW Contact  Nicanor Bake Phone Number: [phone] 06/05/2023, 11:09 AM  Clinical Narrative:  10:31 AM- HF CSW attempted to meet with pt and fmaily at bedside. Pt was being seen by  staff. CSW will follow up at a more appropriate time.   TOC will continue following.      Expected Discharge Plan: Home/Self Care Barriers to Discharge: Continued Medical Work up  Expected Discharge Plan and Services   Discharge Planning Services: CM Consult   Living arrangements for the past 2 months: Single Family Home                                       Social Determinants of Health (SDOH) Interventions SDOH Screenings   Food Insecurity: No Food Insecurity (05/31/2023)  Housing: Low Risk  (05/31/2023)  Transportation Needs: No Transportation Needs (05/31/2023)  Utilities: Not At Risk (05/31/2023)  Depression (PHQ2-9): Low Risk  (04/01/2023)  Social Connections: Moderately Isolated (05/31/2023)  Tobacco Use: Medium Risk (05/31/2023)    Readmission Risk Interventions     No data to display

## 2023-06-05 NOTE — Progress Notes (Signed)
 BG on fingerstick 200s, carrier fluid with levophed may still be causing slight increase in lab draws. Will give tolvaptan 15 mg now for Na 121. Recheck BMP in 4 hours (ordered).

## 2023-06-05 NOTE — Progress Notes (Addendum)
 Patient ID: [name], male   DOB: [birth year], 83 y.o.   MRN: [id number]     Advanced Heart Failure Rounding Note  Cardiologist: Chrystie Nose, MD  Chief Complaint: CHF Subjective:   Co-ox 66% this morning on milrinone 0.375 and NE @12 .   Creatinine 2.57 > 2.49 > 2.99> 3.2>3.34>3.61.  SBP 90s  Na 118 this morning. Got Tolvaptan x1. Up to 121 this morning. With glucose correction Na at 126.   CVP ~10.   AST 2704> 1498> 822> 493 ALT 1514>1413>1253> 1K  Completed empiric cefepime for possible PNA. Afebrile, WBC WNL now.   In atrial fibrillation rate 90s-low 100s, on amiodarone gtt and apixaban.   Feels good this morning. Already sat in the chair this morning. Wife at bedside. Has been ambulating in the halls. Denies dizziness.   Objective:   Weight Range: 61.7 kg Body mass index is 21.97 kg/m.   Vital Signs:   Temp:  [97.5 F (36.4 C)-97.9 F (36.6 C)] 97.6 F (36.4 C) (03/19 0830) Pulse Rate:  [92-157] 112 (03/19 0800) Resp:  [14-32] 23 (03/19 0800) BP: (78-108)/(48-92) 105/74 (03/19 0800) SpO2:  [71 %-99 %] 95 % (03/19 0800) Weight:  [61.7 kg] 61.7 kg (03/19 0500) Last BM Date : 06/04/23  Weight change: Filed Weights   06/03/23 0500 06/04/23 0500 06/05/23 0500  Weight: 58.4 kg 60.2 kg 61.7 kg   Intake/Output:   Intake/Output Summary (Last 24 hours) at 06/05/2023 1022 Last data filed at 06/05/2023 0900 Gross per 24 hour  Intake 1593.31 ml  Output 1300 ml  Net 293.31 ml    CVP ~10 Physical Exam   General:  elderly appearing.  No respiratory difficulty Neck: supple. JVD ~7 cm.  Cor: PMI nondisplaced. Regular rate & irregular rhythm. No rubs, gallops or murmurs. Lungs: clear Abdomen: soft, nontender, nondistended. Good bowel sounds. Extremities: no cyanosis, clubbing, rash, edema. PICC RUE GU +foley Neuro: alert & oriented x 3. Moves all 4 extremities w/o difficulty. Affect pleasant.   Telemetry   A fib 90s-low 100s. Intt PVCs. 6 beat run NSVT  (personally reviewed)  Labs    CBC Recent Labs    06/04/23 0408 06/05/23 0349  WBC 7.9 7.7  HGB 11.4* 11.3*  HCT 32.9* 32.7*  MCV 82.7 83.2  PLT 181 176   Basic Metabolic Panel Recent Labs    91/47/82 0408 06/05/23 0349 06/05/23 0603  NA 126* 118* 121*  K 3.2* 3.4* 3.5  CL 87* 80* 85*  CO2 22 23 25   GLUCOSE 241* 444* 309*  BUN 90* 92* 95*  CREATININE 3.34* 3.52* 3.61*  CALCIUM 7.5* 7.5* 7.4*  MG 2.3 2.2  --    Liver Function Tests Recent Labs    06/04/23 0408 06/05/23 0349  AST 493* 264*  ALT 1,015* 786*  ALKPHOS 54 47  BILITOT 1.1 1.0  PROT 5.9* 6.2*  ALBUMIN 2.7* 2.7*   No results for input(s): "LIPASE", "AMYLASE" in the last 72 hours. Cardiac Enzymes No results for input(s): "CKTOTAL", "CKMB", "CKMBINDEX", "TROPONINI" in the last 72 hours.  BNP: BNP (last 3 results) Recent Labs    03/08/23 1105 04/25/23 1151 05/30/23 1944  BNP 439.5* 1,246.8* 2,426.1*    ProBNP (last 3 results) No results for input(s): "PROBNP" in the last 8760 hours.   D-Dimer No results for input(s): "DDIMER" in the last 72 hours. Hemoglobin A1C No results for input(s): "HGBA1C" in the last 72 hours. Fasting Lipid Panel No results for input(s): "CHOL", "HDL", "LDLCALC", "TRIG", "CHOLHDL", "  LDLDIRECT" in the last 72 hours. Thyroid Function Tests No results for input(s): "TSH", "T4TOTAL", "T3FREE", "THYROIDAB" in the last 72 hours.  Invalid input(s): "FREET3"  Other results:  Imaging  No results found.   Medications:   Scheduled Medications:  apixaban  2.5 mg Oral BID   Chlorhexidine Gluconate Cloth  6 each Topical Daily   feeding supplement  237 mL Oral BID BM   insulin aspart  0-15 Units Subcutaneous TID WC   lidocaine  1 patch Transdermal Q24H   mometasone-formoterol  2 puff Inhalation BID   pravastatin  40 mg Oral Daily   sodium chloride flush  10-40 mL Intracatheter Q12H   sodium chloride flush  3 mL Intravenous Q12H    Infusions:  amiodarone 30 mg/hr  (06/05/23 0900)   milrinone 0.375 mcg/kg/min (06/05/23 0900)   norepinephrine (LEVOPHED) Adult infusion 12 mcg/min (06/05/23 1016)    PRN Medications: acetaminophen **OR** acetaminophen, albuterol, alum & mag hydroxide-simeth, Muscle Rub, sodium chloride flush, trimethobenzamide  Assessment/Plan  1. Acute on chronic systolic CHF: Cardiolite in 9/20 showed EF 43%, ischemic cardiomyopathy with anteroapical infarction.  In setting of AF/RVR in 2/25, TEE showed EF down to EF 20-25% with moderate RV dysfunction, mild-moderate MR.  Patient is now admitted with NYHA IV symptoms, cardiorenal syndrome with creatinine up to 2.57. 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  Significant volume overload on admission. Initial lactate 7.2, milrinone 0.25 started.  Lactate 1.9 on 3/15 with co-ox 71%.  - This morning, co-ox 66% and creatinine up to 3.61. Na 118? Yesterday. Given Tolvaptan x1. Na 121 today. Corrected 126.  - Volume mildly elevated but stable. Weight up 4lbs. CVP ~10 - Continue milrinone 0.375 - NE up to 12 today.   - Will hold off on midodrine for now. Add compression socks.  [employer], can restart once foley removed.  - Needs conversion to NSR (see below). - Not candidate for advanced therapies with age/frailty. With worsening renal function, I worry that he is nearing end stage. Will see how he does but expect we will need to involve palliative care.  Would not be a good HD candidate.  - Narrow QRS, not CRT candidate.   2. Atrial fibrillation: Persistent.  Successful DCCV in 2/25 but back in AF/RVR by 05/24/23.  He has been on po amiodarone. 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 HR 100-110s this morning in AF.  - Amiodarone gtt at 30 mg/hr for now while on inotrope's - Continue apixaban.  - Aim for DCCV after diuresis. He has not missed any Eliquis doses,  does not need TEE.   3. CAD: H/o CABG 2008 with LIMA-LAD, seq SVG-OM1/2, SVG-PDA.  No cath since CABG.  Cardiolite in 9/20 with anteroapical infarction.  No chest pain.  HS-TnI minimally elevated with no trend, suspect demand ischemia from volume overload.  - Think we can avoid coronary angiography in absence of chest pain with AKI.  - Not on ASA given Eliquis.  - Continue home pravastatin  4. AKI on CKD stage 3:  Suspect cardiorenal syndrome with low output HF.  Initially hyperkalemic, now resolved.  Creatinine 2.49> 2.99> 3.2> 3.3>3.6 today. UOP slowed down but still clear - Support CO with milrinone and NE - Would be poor HD candidate.   5. ID: Possible PNA on CXR.  Afebrile, WBCs 11.3> 10> 7.9.   - Completed empiric cefepime.   6. COPD: Prior  smoker.   7. Hyponatremia: Hypervolemic hyponatremia - Fluid restrict - s/p Tolvaptan 3/16. Na 122>127>126>118 (given tolvaptan this morning) up to 121 (corrected at 126 with high glucose)  8. Elevated LFTs: Suspect shock liver. LFTs trending down.  - Follow LFTs.   Mobilize, PT/OT.   CRITICAL CARE Performed by: Alen Bleacher  Total critical care time: 14 minutes  Critical care time was exclusive of separately billable procedures and treating other patients.  Critical care was necessary to treat or prevent imminent or life-threatening deterioration.  Critical care was time spent personally by me on the following activities: development of treatment plan with patient and/or surrogate as well as nursing, discussions with consultants, evaluation of patient's response to treatment, examination of patient, obtaining history from patient or surrogate, ordering and performing treatments and interventions, ordering and review of laboratory studies, ordering and review of radiographic studies, pulse oximetry and re-evaluation of patient's condition.   Length of Stay: 5  Alen Bleacher, NP  06/05/2023, 10:22 AM  Advanced Heart Failure Team Pager  [phone] (M-F; 7a - 5p)  Please contact [facility] for night-coverage after hours (5p -7a ) and weekends on [email or web address]  Agree with above.   NE up to 12 overnight. Remains on milrinone 0.375. Co-ox marginal.   Remains in AF on IV amio with reasonable rate control.   Scr slightly worse but decent urine output  Serum sodium remains low   Denies CP or SOB  General:  Elderly  No resp difficulty HEENT: normal Neck: supple. JVP 10 Carotids 2+ bilat; no bruits. No lymphadenopathy or thryomegaly appreciated. Cor: irreg  Lungs: clear Abdomen: soft, nontender, nondistended. No hepatosplenomegaly. No bruits or masses. Good bowel sounds. Extremities: no cyanosis, clubbing, rash, edema Neuro: alert & orientedx3, cranial nerves grossly intact. moves all 4 extremities w/o difficulty. Affect pleasant  He is very tenuous with increasing pressor needs and worsening renal function.   Continue mirlinone and NE. Keep SBP > 90 to promote renal perfusion.   Hold lasix one more day. Agree with tolvaptan.   Continue amio   Long discussion with him and his wife. I am very concerned about his trajectory.   CRITICAL CARE Performed by: Arvilla Meres  Total critical care time: 45 minutes  Critical care time was exclusive of separately billable procedures and treating other patients.  Critical care was necessary to treat or prevent imminent or life-threatening deterioration.  Critical care was time spent personally by me (independent of midlevel providers or residents) on the following activities: development of treatment plan with patient and/or surrogate as well as nursing, discussions with consultants, evaluation of patient's response to treatment, examination of patient, obtaining history from patient or surrogate, ordering and performing treatments and interventions, ordering and review of laboratory studies, ordering and review of radiographic studies, pulse oximetry and re-evaluation of  patient's condition.  Arvilla Meres, MD  5:11 PM

## 2023-06-05 NOTE — Progress Notes (Addendum)
 Initial Nutrition Assessment  DOCUMENTATION CODES:   Severe malnutrition in context of chronic illness (COPD, CHF)  INTERVENTION:  Magic cup BID with meals, each supplement provides 290 kcal and 9 grams of protein  Ensure Enlive po BID, each supplement provides 350 kcal and 20 grams of protein.  Liberalize diet from heart healthy to regular to encourage adequate intake that meets calorie and protein needs. Fluid restriction 1.8L to allow ONS intake to help meet protein needs.  Discussed bowel regimen and insulin regimen with pharmacist and NP. Bowel regimen ordered to help constipation. Lantus ordered to help insulin coverage to control glucose levels.   NUTRITION DIAGNOSIS:  Severe Malnutrition related to chronic illness (CHF, COPD) as evidenced by severe fat depletion, severe muscle depletion, energy intake < 75% for > or equal to 1 month.  GOAL:  Patient will meet greater than or equal to 90% of their needs  MONITOR:  PO intake, Supplement acceptance, I & O's, Weight trends  REASON FOR ASSESSMENT:  Consult Assessment of nutrition requirement/status  ASSESSMENT:  Pt with hx of HLD, HTN, a-fib, CAD, CHF, type 2 diabetes, COPD, basal cell carcinoma, and asthma. Hx of CABG x4 (01/2013) and cardioversion (04/2023). Admitted for fatigue, SOB, abdominal bloating, diagnosed with a-fib on chronic anticoagulation, SIRS, and acute on chronic heart failure.  Pt has been lasix since admission, but not administered today. Pt has diuresed a net 3L since admission. Continued administering of levo at 40mcg/h. Bowel regimen ordered today for constipation after RD discussion with pharmacist. Insulin regimen adjusted, lantus ordered to help control blood glucose while admitted since it was high previous day. Nephrology suspects pt has progressed to renal failure.  Spoke with pt and pt's wife who was at bedside. Pt reports appetite during admission has been poor due to indigestion and feeling like he  gets full quickly. This may be due to pt carrying fluid in abdomen related to heart failure and not having a sufficient bowel movement recently, most recent reported bowel movement this morning 3/18 as type 1. Discussed adding a bowel regimen to help motility, pt agreed. Pt reports eating 1/3 of omelet for breakfast, 1/4 of his fruit cup and 1/4 of his coffee. Pt reports liking vanilla Ensure, agreeable to continue drinking them. Pt also agreeable to continue magic cups. Discussed liberalizing diet to offer more options for pt since he is not eating well and is malnourished.   PTA, pt's wife reports pt would eat 2-3 x per day and if he skipped a meal, it would be breakfast. Pt would try to eat yogurt for breakfast. He would eat something light for lunch like a tuna salad sandwich with cheese. For dinner, pt would have spaghetti or beef with vegetables. Pt reports not eating well for a few weeks leading up to admission.  Pt would regularly weigh himself at the same time every day before eating and reports his typical weight around 135#. Over the last few weeks, pt's weight had decreased down to 129# before pt and his wife started noticing fluid begin to build up. Pt reports nurse said his weight today is 132# while still holding some fluid, suspect dry weight of 135# may be inaccurate.   Pt's mobility PTA fair. Pt has been ambulating around hospital floor with RN assistance and walker multiple times per day. Pt feels weaker than before but still able to complete most ADLs independently or with little assistance.  Medications reviewed and include:  Novolog Lantus Levo  Miralax Senna  Labs reviewed:  CBG last 24 hours: 118-245 Sodium 118-121 Potassium 3.5<--- 3.4 A1c 6.4 Ca 8.44 BUN 95 Creatinine 3.61 AST 264, ALT 786  NUTRITION - FOCUSED PHYSICAL EXAM:  Flowsheet Row Most Recent Value  Orbital Region Severe depletion  Upper Arm Region Severe depletion  Thoracic and Lumbar Region Severe  depletion  Buccal Region Mild depletion  Temple Region Severe depletion  Clavicle Bone Region Severe depletion  Clavicle and Acromion Bone Region Severe depletion  Scapular Bone Region Severe depletion  Dorsal Hand Severe depletion  Patellar Region Severe depletion  Anterior Thigh Region Severe depletion  Posterior Calf Region Severe depletion  Edema (RD Assessment) Mild  [abdomen]  Hair Reviewed  Eyes Reviewed  Mouth Reviewed  Skin Reviewed  Nails Reviewed   Diet Order:   Diet Order             Diet heart healthy/carb modified Room service appropriate? Yes; Fluid consistency: Thin; Fluid restriction: 1200 mL Fluid  Diet effective now                  EDUCATION NEEDS:   Education needs have been addressed  Skin:  Skin Assessment: Skin Integrity Issues: Skin Integrity Issues:: Stage I Stage I: coccyx  Last BM:  3/18 type 1  Height:  Ht Readings from Last 1 Encounters:  05/31/23 5\' 6"  (1.676 m)   Weight:  Wt Readings from Last 1 Encounters:  06/05/23 61.7 kg   Ideal Body Weight:  64.5 kg  BMI:  Body mass index is 21.97 kg/m.  Estimated Nutritional Needs:   Kcal:  1700-1900  Protein:  75-90g  Fluid:  1L + UOP  Louis Meckel Dietetic Intern

## 2023-06-06 ENCOUNTER — Encounter (HOSPITAL_COMMUNITY): Payer: Medicare Other

## 2023-06-06 DIAGNOSIS — I4819 Other persistent atrial fibrillation: Secondary | ICD-10-CM | POA: Diagnosis not present

## 2023-06-06 DIAGNOSIS — I5023 Acute on chronic systolic (congestive) heart failure: Secondary | ICD-10-CM | POA: Diagnosis not present

## 2023-06-06 LAB — COOXEMETRY PANEL
Carboxyhemoglobin: 1.3 % (ref 0.5–1.5)
Methemoglobin: <0.7 % (ref 0.0–1.5)
O2 Saturation: 56.8 %
Total hemoglobin: 12.2 g/dL (ref 12.0–16.0)

## 2023-06-06 LAB — HEPATIC FUNCTION PANEL
ALT: 666 U/L — ABNORMAL HIGH (ref 0–44)
AST: 145 U/L — ABNORMAL HIGH (ref 15–41)
Albumin: 3.2 g/dL — ABNORMAL LOW (ref 3.5–5.0)
Alkaline Phosphatase: 64 U/L (ref 38–126)
Bilirubin, Direct: 0.2 mg/dL (ref 0.0–0.2)
Indirect Bilirubin: 0.7 mg/dL (ref 0.3–0.9)
Total Bilirubin: 0.9 mg/dL (ref 0.0–1.2)
Total Protein: 7.1 g/dL (ref 6.5–8.1)

## 2023-06-06 LAB — BASIC METABOLIC PANEL
Anion gap: 15 (ref 5–15)
BUN: 96 mg/dL — ABNORMAL HIGH (ref 8–23)
CO2: 23 mmol/L (ref 22–32)
Calcium: 8.3 mg/dL — ABNORMAL LOW (ref 8.9–10.3)
Chloride: 85 mmol/L — ABNORMAL LOW (ref 98–111)
Creatinine, Ser: 3.66 mg/dL — ABNORMAL HIGH (ref 0.61–1.24)
GFR, Estimated: 16 mL/min — ABNORMAL LOW (ref >60)
Glucose, Bld: 176 mg/dL — ABNORMAL HIGH (ref 70–99)
Potassium: 4.2 mmol/L (ref 3.5–5.1)
Sodium: 123 mmol/L — ABNORMAL LOW (ref 135–145)

## 2023-06-06 LAB — GLUCOSE, CAPILLARY
Glucose-Capillary: 180 mg/dL — ABNORMAL HIGH (ref 70–99)
Glucose-Capillary: 171 mg/dL — ABNORMAL HIGH (ref 70–99)
Glucose-Capillary: 184 mg/dL — ABNORMAL HIGH (ref 70–99)
Glucose-Capillary: 168 mg/dL — ABNORMAL HIGH (ref 70–99)
Glucose-Capillary: 198 mg/dL — ABNORMAL HIGH (ref 70–99)
Glucose-Capillary: 199 mg/dL — ABNORMAL HIGH (ref 70–99)
Glucose-Capillary: 224 mg/dL — ABNORMAL HIGH (ref 70–99)

## 2023-06-06 MED ORDER — TOLVAPTAN 15 MG PO TABS
15.0000 mg | ORAL_TABLET | Freq: Once | ORAL | Status: AC
  Administered 2023-06-06: 15 mg via ORAL
  Filled 2023-06-06: qty 1

## 2023-06-06 MED ORDER — FUROSEMIDE 10 MG/ML IJ SOLN
80.0000 mg | Freq: Once | INTRAMUSCULAR | Status: AC
  Administered 2023-06-06: 80 mg via INTRAVENOUS
  Filled 2023-06-06: qty 8

## 2023-06-06 MED ORDER — SORBITOL 70 % SOLN
30.0000 mL | Freq: Once | Status: AC
  Administered 2023-06-06: 30 mL via ORAL
  Filled 2023-06-06: qty 30

## 2023-06-06 NOTE — Progress Notes (Addendum)
 Patient ID: [name], male   DOB: [birth year], 83 y.o.   MRN: [id number]     Advanced Heart Failure Rounding Note  Cardiologist: Chrystie Nose, MD  Chief Complaint: CHF Subjective:   Co-ox 57% this morning on milrinone 0.375 and NE @12 .    Creatinine 2.57 > 2.49 > 2.99> 3.2>3.34>3.61>3.9>3.6.  SBP 90s-100s  Na 123.  CVP 10-11. Have been holding diuretics to watch for renal recovery. Weight up 3 lbs but he was weighted in bed this morning. Asked RN to reweigh standing.   Feels good this morning. Already sat in the chair early this morning and walked around the unit. Now resting. Denies CP. A little SOB after the recent activity.   Objective:   Weight Range: 63.5 kg Body mass index is 22.6 kg/m.   Vital Signs:   Temp:  [97.5 F (36.4 C)-97.8 F (36.6 C)] 97.5 F (36.4 C) (03/20 0700) Pulse Rate:  [105-131] 111 (03/20 0800) Resp:  [12-23] 17 (03/20 0800) BP: (84-111)/(56-86) 110/72 (03/20 0800) SpO2:  [82 %-98 %] 95 % (03/20 0800) Weight:  [63.5 kg] 63.5 kg (03/20 0500) Last BM Date : 06/04/23  Weight change: Filed Weights   06/04/23 0500 06/05/23 0500 06/06/23 0500  Weight: 60.2 kg 61.7 kg 63.5 kg   Intake/Output:   Intake/Output Summary (Last 24 hours) at 06/06/2023 0929 Last data filed at 06/06/2023 0800 Gross per 24 hour  Intake 2164.73 ml  Output 775 ml  Net 1389.73 ml    CVP 10-11 Physical Exam   General:  Elderly appearing.  No respiratory difficulty HEENT: normal Neck: supple. JVD ~12 with TR waves to ~14 cm.  Cor: PMI nondisplaced. Regular rate & irregular rhythm. No rubs, gallops or murmurs. Lungs: clear Extremities: no cyanosis, clubbing, rash, edema/ + compression socks Neuro: alert & oriented x 3. Moves all 4 extremities w/o difficulty. Affect pleasant.   Telemetry   A fib 110s. 1-5 PVCs/min (personally reviewed)  Labs    CBC Recent Labs    06/04/23 0408 06/05/23 0349  WBC 7.9 7.7  HGB 11.4* 11.3*  HCT 32.9* 32.7*  MCV 82.7 83.2   PLT 181 176   Basic Metabolic Panel Recent Labs    75/64/33 0408 06/05/23 0349 06/05/23 0603 06/05/23 1427 06/06/23 0345  NA 126* 118*   < > 124* 123*  K 3.2* 3.4*   < > 4.6 4.2  CL 87* 80*   < > 85* 85*  CO2 22 23   < > 23 23  GLUCOSE 241* 444*   < > 184* 176*  BUN 90* 92*   < > 100* 96*  CREATININE 3.34* 3.52*   < > 3.92* 3.66*  CALCIUM 7.5* 7.5*   < > 8.2* 8.3*  MG 2.3 2.2  --   --   --    < > = values in this interval not displayed.   Liver Function Tests Recent Labs    06/04/23 0408 06/05/23 0349  AST 493* 264*  ALT 1,015* 786*  ALKPHOS 54 47  BILITOT 1.1 1.0  PROT 5.9* 6.2*  ALBUMIN 2.7* 2.7*   No results for input(s): "LIPASE", "AMYLASE" in the last 72 hours. Cardiac Enzymes No results for input(s): "CKTOTAL", "CKMB", "CKMBINDEX", "TROPONINI" in the last 72 hours.  BNP: BNP (last 3 results) Recent Labs    03/08/23 1105 04/25/23 1151 05/30/23 1944  BNP 439.5* 1,246.8* 2,426.1*    ProBNP (last 3 results) No results for input(s): "PROBNP" in the last 8760 hours.  D-Dimer No results for input(s): "DDIMER" in the last 72 hours. Hemoglobin A1C No results for input(s): "HGBA1C" in the last 72 hours. Fasting Lipid Panel No results for input(s): "CHOL", "HDL", "LDLCALC", "TRIG", "CHOLHDL", "LDLDIRECT" in the last 72 hours. Thyroid Function Tests No results for input(s): "TSH", "T4TOTAL", "T3FREE", "THYROIDAB" in the last 72 hours.  Invalid input(s): "FREET3"  Other results:  Imaging  No results found.   Medications:   Scheduled Medications:  apixaban  2.5 mg Oral BID   Chlorhexidine Gluconate Cloth  6 each Topical Daily   feeding supplement  237 mL Oral TID BM   insulin aspart  0-15 Units Subcutaneous TID WC   insulin glargine  4 Units Subcutaneous Daily   lidocaine  1 patch Transdermal Q24H   mometasone-formoterol  2 puff Inhalation BID   polyethylene glycol  17 g Oral Daily   pravastatin  40 mg Oral Daily   senna-docusate  1 tablet  Oral BID   sodium chloride flush  10-40 mL Intracatheter Q12H   sodium chloride flush  3 mL Intravenous Q12H    Infusions:  amiodarone 30 mg/hr (06/06/23 0800)   milrinone 0.375 mcg/kg/min (06/06/23 0800)   norepinephrine (LEVOPHED) Adult infusion 12 mcg/min (06/06/23 0848)    PRN Medications: acetaminophen **OR** acetaminophen, albuterol, alum & mag hydroxide-simeth, Muscle Rub, sodium chloride flush, trimethobenzamide  Assessment/Plan  1. Acute on chronic systolic CHF: Cardiolite in 9/20 showed EF 43%, ischemic cardiomyopathy with anteroapical infarction.  In setting of AF/RVR in 2/25, TEE showed EF down to EF 20-25% with moderate RV dysfunction, mild-moderate MR.  Patient is now admitted with NYHA IV symptoms, cardiorenal syndrome with creatinine up to 2.57. 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  Significant volume overload on admission. Initial lactate 7.2, milrinone 0.25 started.  Lactate 1.9 on 3/15 with co-ox 71%.  - This morning, co-ox 57% and creatinine down trending> 3.61. Have been holding diuretics in hopes of renal recovery.  - Volume mildly elevated but stable. Weight trending up but weighted differently. Asked RN to get standing weight. CVP 10-11. Give 80 IV lasix x1 - Continue milrinone 0.375 - NE @ 12 today.  Goal SBP >90 - Will hold off on midodrine for now. Compression socks added yesterday.  - Needs conversion to NSR (see below). - Not candidate for advanced therapies with age/frailty. With worsening renal function, I worry that he is nearing end stage. Will see how he does but expect we will need to involve palliative care.  Would not be a good HD candidate.  - Narrow QRS, not CRT candidate.   2. Atrial fibrillation: Persistent.  Successful DCCV in 2/25 but back in AF/RVR by 05/24/23.  He has been on po amiodarone. As above, given concern for tachy-mediated CMP, needs  conversion to NSR. HR 100-110s this morning in AF.  - Amiodarone gtt at 30 mg/hr for now while on inotrope's - Continue apixaban.  - Aim for DCCV after fully diuresed. He has not missed any Eliquis doses, does not need TEE.   3. CAD: H/o CABG 2008 with LIMA-LAD, seq SVG-OM1/2, SVG-PDA.  No cath since CABG.  Cardiolite in 9/20 with anteroapical infarction.  No chest pain.  HS-TnI minimally elevated with no trend, suspect demand ischemia from volume overload.  - Think we can avoid coronary angiography in absence of chest pain with AKI.  - Not on ASA given Eliquis.  - Continue home pravastatin  4. AKI on  CKD stage 3:  Suspect cardiorenal syndrome with low output HF.  Initially hyperkalemic, now resolved.  Creatinine 2.49> 2.99> 3.2> 3.3>3.6 today. UOP slowed down but still clear - Support CO with milrinone and NE - Would be poor HD candidate.   5. ID: Possible PNA on CXR.  Afebrile, WBCs 11.3> 10> 7.9.   - Completed empiric cefepime for possible PNA. Afebrile, WBC WNL now.  - CBC tomorrow  6. COPD: Prior smoker.   7. Hyponatremia: Hypervolemic hyponatremia - Fluid restrict - s/p Tolvaptan 3/16 and 3/19. Na 123 today (corrected at 125 with high glucose) - Will repeat tolvaptan today  8. Elevated LFTs: Suspect shock liver. LFTs trending down.  - AST 2704> 1498> 822> 493> pending today - ALT 1514>1413>1253> 1K>pending today.   Mobilize, PT/OT.   CRITICAL CARE Performed by: Alen Bleacher  Total critical care time: 10 minutes  Critical care time was exclusive of separately billable procedures and treating other patients.  Critical care was necessary to treat or prevent imminent or life-threatening deterioration.  Critical care was time spent personally by me on the following activities: development of treatment plan with patient and/or surrogate as well as nursing, discussions with consultants, evaluation of patient's response to treatment, examination of patient, obtaining history from  patient or surrogate, ordering and performing treatments and interventions, ordering and review of laboratory studies, ordering and review of radiographic studies, pulse oximetry and re-evaluation of patient's condition.   Length of Stay: 6  Alen Bleacher, NP  06/06/2023, 9:29 AM  Advanced Heart Failure Team Pager [phone] (M-F; 7a - 5p)  Please contact [facility] for night-coverage after hours (5p -7a ) and weekends on [email or web address]  Agree with above.  Remains on NE 12 and milrinone 0.375 Coox 57% CVP 10   Denies CP or SOB. Scr slightly better  Remains in AF on IV amio  General:  Elderly No resp difficulty HEENT: normal Neck: supple. JVP 10 Carotids 2+ bilat; no bruits. No lymphadenopathy or thryomegaly appreciated. Cor: Irreg  Lungs: clear Abdomen: soft, nontender, nondistended. No hepatosplenomegaly. No bruits or masses. Good bowel sounds. Extremities: no cyanosis, clubbing, rash, tr edema Neuro: alert & orientedx3, cranial nerves grossly intact. moves all 4 extremities w/o difficulty. Affect pleasant  Remains very tenuous.   Continue NE 12 and milrinone to support cardiac output and BP (SBP > 90) until Scr nadirs. If no significant improvement will need to consider Palliative Care. If renal function improving will slowly wean inotropes once Scr improves.   Continue amio.   Tolvaptan for NA 123 today.   D/w patient and his wife  CRITICAL CARE Performed by: Arvilla Meres  Total critical care time: 45 minutes  Critical care time was exclusive of separately billable procedures and treating other patients.  Critical care was necessary to treat or prevent imminent or life-threatening deterioration.  Critical care was time spent personally by me (independent of midlevel providers or residents) on the following activities: development of treatment plan with patient and/or surrogate as well as nursing, discussions with consultants, evaluation of patient's response to  treatment, examination of patient, obtaining history from patient or surrogate, ordering and performing treatments and interventions, ordering and review of laboratory studies, ordering and review of radiographic studies, pulse oximetry and re-evaluation of patient's condition.   Arvilla Meres, MD  3:26 PM

## 2023-06-06 NOTE — Progress Notes (Signed)
 Occupational Therapy Treatment Patient Details Name: [name] MRN: [id number] DOB: 10-31-1940 Today's Date: 06/06/2023   History of present illness [name] is a 83 y.o. male who presents with fatigue, shortness of breath, and abdominal bloating for the past week.  PMH: HTN, HLD, persistent a-fib, CAD s/p CABG, chronic systolic CHF, diabetes mellitus type 2, and COPD   OT comments  Session focused on continued mobility and determination of most appropriate DME to decrease fall risk. Pt able to mobilize w/ Supervision with Rollator and RW; reporting preference for RW during session today. SpO2 100% on 2 L O2 when using ear probe to monitor. Per wife, they only have a standard walker at home (no wheels on it) and would need RW at time of DC.      If plan is discharge home, recommend the following:  Assistance with cooking/housework;Help with stairs or ramp for entrance   Equipment Recommendations  Other (comment) (RW (they have only a standard walker at home))    Recommendations for Other Services      Precautions / Restrictions Precautions Precautions: Fall Precaution/Restrictions Comments: watch O2 Restrictions Weight Bearing Restrictions Per Provider Order: No       Mobility Bed Mobility Overal bed mobility: Modified Independent                  Transfers Overall transfer level: Needs assistance Equipment used: Rollator (4 wheels) Transfers: Sit to/from Stand Sit to Stand: Supervision           General transfer comment: cues for brake mgmt with pt able to return demo. does stand quickly     Balance Overall balance assessment: Mild deficits observed, not formally tested                                         ADL either performed or assessed with clinical judgement   ADL Overall ADL's : Needs assistance/impaired                     Lower Body Dressing: Supervision/safety;Sitting/lateral leans;Sit to/from stand                Functional mobility during ADLs: Supervision/safety;Rollator (4 wheels);Rolling walker (2 wheels) General ADL Comments: Trial of Rollator vs RW w/ pt preferring RW today d/t feeling like Rollator was getting away from him. Pt and RN opted for in room session d/t recent laxative administration.    Extremity/Trunk Assessment Upper Extremity Assessment Upper Extremity Assessment: Overall WFL for tasks assessed;Right hand dominant   Lower Extremity Assessment Lower Extremity Assessment: Defer to PT evaluation        Vision   Vision Assessment?: No apparent visual deficits;Wears glasses for reading   Perception     Praxis     Communication Communication Communication: No apparent difficulties   Cognition Arousal: Alert Behavior During Therapy: WFL for tasks assessed/performed Cognition: No apparent impairments                               Following commands: Intact        Cueing   Cueing Techniques: Verbal cues  Exercises      Shoulder Instructions       General Comments Wife at bedside. Pt with better O2 readings on ear - on 2 L O2 today during session with  SpO2 100% with activity. On RA in prior OT session and did well    Pertinent Vitals/ Pain       Pain Assessment Pain Assessment: No/denies pain  Home Living                                          Prior Functioning/Environment              Frequency  Min 1X/week        Progress Toward Goals  OT Goals(current goals can now be found in the care plan section)  Progress towards OT goals: Progressing toward goals  Acute Rehab OT Goals Patient Stated Goal: for kidney function to improve OT Goal Formulation: With patient/family Time For Goal Achievement: 06/17/23 Potential to Achieve Goals: Good ADL Goals Pt Will Transfer to Toilet: with modified independence;ambulating Additional ADL Goal #1: Pt to verbalize at least 3 fall prevention strategies to  implement at home. Additional ADL Goal #2: Pt to demo ability to gather ADL/IADL items without LOB or safety concerns using least restrictive AD  Plan      Co-evaluation                 AM-PAC OT "6 Clicks" Daily Activity     Outcome Measure   Help from another person eating meals?: None Help from another person taking care of personal grooming?: A Little Help from another person toileting, which includes using toliet, bedpan, or urinal?: A Little Help from another person bathing (including washing, rinsing, drying)?: A Little Help from another person to put on and taking off regular upper body clothing?: A Little Help from another person to put on and taking off regular lower body clothing?: A Little 6 Click Score: 19    End of Session Equipment Utilized During Treatment: Rollator (4 wheels);Oxygen  OT Visit Diagnosis: Unsteadiness on feet (R26.81)   Activity Tolerance Patient tolerated treatment well   Patient Left in chair;with call bell/phone within reach;with family/visitor present   Nurse Communication          Time: 8469-6295 OT Time Calculation (min): 21 min  Charges: OT General Charges $OT Visit: 1 Visit OT Treatments $Therapeutic Activity: 8-22 mins  [name], OTR/L Acute Rehab Services Office: [phone]   Lorre Munroe 06/06/2023, 1:45 PM

## 2023-06-07 DIAGNOSIS — I5023 Acute on chronic systolic (congestive) heart failure: Secondary | ICD-10-CM | POA: Diagnosis not present

## 2023-06-07 DIAGNOSIS — I4819 Other persistent atrial fibrillation: Secondary | ICD-10-CM | POA: Diagnosis not present

## 2023-06-07 LAB — COMPREHENSIVE METABOLIC PANEL
ALT: 519 U/L — ABNORMAL HIGH (ref 0–44)
AST: 92 U/L — ABNORMAL HIGH (ref 15–41)
Albumin: 2.8 g/dL — ABNORMAL LOW (ref 3.5–5.0)
Alkaline Phosphatase: 56 U/L (ref 38–126)
Anion gap: 12 (ref 5–15)
BUN: 89 mg/dL — ABNORMAL HIGH (ref 8–23)
CO2: 21 mmol/L — ABNORMAL LOW (ref 22–32)
Calcium: 7.3 mg/dL — ABNORMAL LOW (ref 8.9–10.3)
Chloride: 83 mmol/L — ABNORMAL LOW (ref 98–111)
Creatinine, Ser: 3.41 mg/dL — ABNORMAL HIGH (ref 0.61–1.24)
GFR, Estimated: 17 mL/min — ABNORMAL LOW (ref >60)
Glucose, Bld: 351 mg/dL — ABNORMAL HIGH (ref 70–99)
Potassium: 3 mmol/L — ABNORMAL LOW (ref 3.5–5.1)
Sodium: 116 mmol/L — CL (ref 135–145)
Total Bilirubin: 0.8 mg/dL (ref 0.0–1.2)
Total Protein: 6.1 g/dL — ABNORMAL LOW (ref 6.5–8.1)

## 2023-06-07 LAB — BASIC METABOLIC PANEL
Anion gap: 15 (ref 5–15)
BUN: 92 mg/dL — ABNORMAL HIGH (ref 8–23)
CO2: 24 mmol/L (ref 22–32)
Calcium: 8.1 mg/dL — ABNORMAL LOW (ref 8.9–10.3)
Chloride: 80 mmol/L — ABNORMAL LOW (ref 98–111)
Creatinine, Ser: 3.52 mg/dL — ABNORMAL HIGH (ref 0.61–1.24)
GFR, Estimated: 16 mL/min — ABNORMAL LOW (ref >60)
Glucose, Bld: 311 mg/dL — ABNORMAL HIGH (ref 70–99)
Potassium: 3.1 mmol/L — ABNORMAL LOW (ref 3.5–5.1)
Sodium: 119 mmol/L — CL (ref 135–145)

## 2023-06-07 LAB — COOXEMETRY PANEL
Carboxyhemoglobin: 1.6 % — ABNORMAL HIGH (ref 0.5–1.5)
Methemoglobin: <0.7 % (ref 0.0–1.5)
O2 Saturation: 68.3 %
Total hemoglobin: 11.2 g/dL — ABNORMAL LOW (ref 12.0–16.0)

## 2023-06-07 LAB — GLUCOSE, CAPILLARY
Glucose-Capillary: 168 mg/dL — ABNORMAL HIGH (ref 70–99)
Glucose-Capillary: 170 mg/dL — ABNORMAL HIGH (ref 70–99)
Glucose-Capillary: 238 mg/dL — ABNORMAL HIGH (ref 70–99)
Glucose-Capillary: 80 mg/dL (ref 70–99)
Glucose-Capillary: 136 mg/dL — ABNORMAL HIGH (ref 70–99)

## 2023-06-07 LAB — CBC
HCT: 31.1 % — ABNORMAL LOW (ref 39.0–52.0)
Hemoglobin: 10.9 g/dL — ABNORMAL LOW (ref 13.0–17.0)
MCH: 28.5 pg (ref 26.0–34.0)
MCHC: 35 g/dL (ref 30.0–36.0)
MCV: 81.2 fL (ref 80.0–100.0)
Platelets: 159 10*3/uL (ref 150–400)
RBC: 3.83 MIL/uL — ABNORMAL LOW (ref 4.22–5.81)
RDW: 13.3 % (ref 11.5–15.5)
WBC: 8.5 10*3/uL (ref 4.0–10.5)
nRBC: 0.2 % (ref 0.0–0.2)

## 2023-06-07 MED ORDER — INSULIN GLARGINE 100 UNIT/ML ~~LOC~~ SOLN
6.0000 [IU] | Freq: Every day | SUBCUTANEOUS | Status: DC
  Administered 2023-06-08 – 2023-06-10 (×3): 6 [IU] via SUBCUTANEOUS
  Filled 2023-06-07 (×3): qty 0.06

## 2023-06-07 MED ORDER — TOLVAPTAN 15 MG PO TABS
30.0000 mg | ORAL_TABLET | Freq: Once | ORAL | Status: AC
  Administered 2023-06-07: 30 mg via ORAL
  Filled 2023-06-07: qty 2

## 2023-06-07 MED ORDER — POTASSIUM CHLORIDE CRYS ER 20 MEQ PO TBCR
40.0000 meq | EXTENDED_RELEASE_TABLET | Freq: Once | ORAL | Status: AC
  Administered 2023-06-07: 40 meq via ORAL
  Filled 2023-06-07: qty 2

## 2023-06-07 NOTE — Progress Notes (Signed)
 Nutrition Follow-up  DOCUMENTATION CODES:   Severe malnutrition in context of chronic illness (COPD, CHF)  INTERVENTION:   Liberalize diet to Carb Modified diet with continue fluid restriction  D/C Ensure Enlive Wife prefers Ensure Max, each supplement provides 150 kcal and 30 grams of protein in 11 ounces. Pt does not really like this supplement either but wife can usually get pt to drink an entire one in a full day. RD offered to order EnsureMax for patient as we have this supplement on formulary but wife prefers to continue to bring from home.   Continue Magic cup TID with meals, each supplement provides 290 kcal and 9 grams of protein  Encourage small, frequent meals; Wife plans to bring in trail mix (low sodium/no added salt) and other nutrient dense snacks for pt to snack on though out the day. These type of snacks provide increased calories/protein without adding increased volume. Also discussed addition of protein powder to smoothies, milkshakes, drinks etc. Plan to order scheduled snacks from nutritional services to be sent as well based on pt preferences.  Recommend continuing to adjust insulin to best manage blood glucose in acute setting, especially in malnourished pt who is not eating well   NUTRITION DIAGNOSIS:   Severe Malnutrition related to chronic illness (CHF, COPD) as evidenced by severe fat depletion, severe muscle depletion, energy intake < 75% for > or equal to 1 month.  Continues but being addressed via diet liberalization, adjusting supplements, encouraging snacks, education   GOAL:   Patient will meet greater than or equal to 90% of their needs  Not Met  MONITOR:   PO intake, Supplement acceptance, I & O's, Weight trends  REASON FOR ASSESSMENT:   Consult Assessment of nutrition requirement/status  ASSESSMENT:   Pt with hx of HLD, HTN, a-fib, CAD, CHF, type 2 diabetes, COPD, basal cell carcinoma, and asthma. Hx of CABG x4 (01/2013) and cardioversion  (04/2023). Admitted for fatigue, SOB, abdominal bloating, diagnosed with a-fib on chronic anticoagulation, SIRS, and acute on chronic heart failure.  Wife at bedside.  Levophed at 12, Milrinone 0.375 Pt remains full code, noted no significant improvements in renal function, heart failure, pt not good candidate for RRT. Noted palliative care consult today  PO intake remains poor with recorded po intake 0-25% of meals. Pt also reports poor appetite, early satiety, persistent "indigestion," increased weakness. Pt reports he feels like he ate a little better this AM which is good to hear. Pt reports he would have eaten more of his eggs if he had been allowed to have some ketchup, currently not "allowed" on heart healthy diet. Given continued inadequate oral intake, plan to liberalize diet again to promote increased intake. Briefly discussed that if unable to increase oral intake with max interventions, insertion of Cortrak with supplemental TF would be next step. However, no plan for Cortrak at this time and plan to continue to encourage intake by mouth  Pt does not like the Ensure supplement, not drinking. Pt is receiving magic cup and likes these; pt is not eating one each time he receives it but is willing to continue these. Wife with EnsureMAX at bedside. Wife concerned about the sugar content in Ensure given hx hx of DM and difficulty controlled blood sugars while in patient. Discussed importance of adequacy of nutrition at this time, pt needs both calories and protein which is why a supplement like Ensure Enlive/Plus High Protein, that is high in both calories and protein, would be recommended. Ensure Max is  lower in sugar but is also low calorie. Pt's poorly controlled blood sugars, from an oral nutrition standpoint, are probably related to inadequate and inconsistent intake rather than excess intake of carbohydrates. Noted insulin being adjusted as well; pt also with acute stress/acute illness which can  further worsen glucose management  Large BM yesterday post bowel regimen; then another small BM. No BM today. Pt reports he felt better after big BM yesterday  UOP 1.3 L in 24 hours. Based on po intake documentation, pt consume around 500-600 mL fluid yesterday by mouth Current wt 59.7 kg Admit wt 61.5 kg Lowest wt 58.4 kg  CBGs still remain an issue-Wife extremely concerned about his poorly controlled DM; CBG 80-238. Pt with hx of DM, on oral DM meds at home, relatively well controlled at home with HgbA1c 6.4 in December. Currently on sliding scale with meals and lantus daily 4 units, increased to 6 units starting tomorrow.  Lab Results  Component Value Date   HGBA1C 6.4 (H) 03/09/2023    Labs: Sodium 119 (L)-corrected sodium 122-124 with serum glucose 311 (question contamination of lab draw given serum glucose does not correlate with CBGs) Potassium 3.1 (L) BUN 92 Creatinine 3.52  Meds KCl Miralax daily Colace Senna-docusate BID Tigan prn for N/V Tolvaptan x 1  Diet Order:  HEART HEALTHY/CARB MODIFIED with 1800 mL Fluid Restriction  EDUCATION NEEDS:   Education needs have been addressed  Skin:  Skin Assessment: Skin Integrity Issues: Skin Integrity Issues:: Stage I Stage I: coccyx  Last BM:  3/21  Height:   Ht Readings from Last 1 Encounters:  05/31/23 5\' 6"  (1.676 m)    Weight:   Wt Readings from Last 1 Encounters:  06/07/23 59.7 kg    Ideal Body Weight:  64.5 kg  BMI:  Body mass index is 21.24 kg/m.  Estimated Nutritional Needs:   Kcal:  1700-1900  Protein:  75-90g  Fluid:  1L + UOP  Romelle Starcher MS, RDN, LDN, CNSC Registered Dietitian 3 Clinical Nutrition RD Inpatient Contact Info in Amion

## 2023-06-07 NOTE — Plan of Care (Signed)

## 2023-06-07 NOTE — Progress Notes (Addendum)
 Patient ID: [name], male   DOB: [birth year], 83 y.o.   MRN: [id number]     Advanced Heart Failure Rounding Note  Cardiologist: Chrystie Nose, MD  Chief Complaint: CHF Subjective:    On Milrinone 0.375 + NE 12. Co-ox 68%. SBPs 90s.   1.3 L in UOP yesterday w/ 80 IV Lasix + tolvaptan. CVP 14 remains on 2L [place]. Repeat BMP. Doubt initial accurate.    Feels slightly better this morning. Appetite increasing. Went for walk this morning. Felt weak but no dyspnea.    Objective:   Weight Range: 59.7 kg Body mass index is 21.24 kg/m.   Vital Signs:   Temp:  [97.5 F (36.4 C)-97.6 F (36.4 C)] 97.6 F (36.4 C) (03/21 0400) Pulse Rate:  [103-136] 109 (03/21 0745) Resp:  [12-28] 20 (03/21 0745) BP: (76-115)/(50-85) 102/64 (03/21 0745) SpO2:  [81 %-99 %] 90 % (03/21 0745) FiO2 (%):  [28 %] 28 % (03/21 0745) Weight:  [59.7 kg] 59.7 kg (03/21 0700) Last BM Date : 06/07/23  Weight change: Filed Weights   06/05/23 0500 06/06/23 0500 06/07/23 0700  Weight: 61.7 kg 63.5 kg 59.7 kg   Intake/Output:   Intake/Output Summary (Last 24 hours) at 06/07/2023 0909 Last data filed at 06/07/2023 5366 Gross per 24 hour  Intake 1599.18 ml  Output 1300 ml  Net 299.18 ml     Physical Exam   CVP 14  General:  elderly, mildly fatigued appearing, no respiratory distress  HEENT: normal Neck: supple. JVD 14 cm. Carotids 2+ bilat; no bruits. No lymphadenopathy or thyromegaly appreciated. Cor: PMI nondisplaced. Irregularly irregular rhythm and rate No rubs, gallops or murmurs. Lungs: decreased BS at the bases  Abdomen: soft, nontender, nondistended. No hepatosplenomegaly. No bruits or masses. Good bowel sounds. Extremities: no cyanosis, clubbing, rash, edema + TED hoses  Neuro: alert & oriented x 3, cranial nerves grossly intact. moves all 4 extremities w/o difficulty. Affect pleasant. GU: + Foley   Telemetry   Afib 110s (personally reviewed)  Labs    CBC Recent Labs    06/05/23 0349  06/07/23 0516  WBC 7.7 8.5  HGB 11.3* 10.9*  HCT 32.7* 31.1*  MCV 83.2 81.2  PLT 176 159   Basic Metabolic Panel Recent Labs    44/03/47 0349 06/05/23 0603 06/06/23 0345 06/07/23 0516  NA 118*   < > 123* 116*  K 3.4*   < > 4.2 3.0*  CL 80*   < > 85* 83*  CO2 23   < > 23 21*  GLUCOSE 444*   < > 176* 351*  BUN 92*   < > 96* 89*  CREATININE 3.52*   < > 3.66* 3.41*  CALCIUM 7.5*   < > 8.3* 7.3*  MG 2.2  --   --   --    < > = values in this interval not displayed.   Liver Function Tests Recent Labs    06/06/23 1331 06/07/23 0516  AST 145* 92*  ALT 666* 519*  ALKPHOS 64 56  BILITOT 0.9 0.8  PROT 7.1 6.1*  ALBUMIN 3.2* 2.8*   No results for input(s): "LIPASE", "AMYLASE" in the last 72 hours. Cardiac Enzymes No results for input(s): "CKTOTAL", "CKMB", "CKMBINDEX", "TROPONINI" in the last 72 hours.  BNP: BNP (last 3 results) Recent Labs    03/08/23 1105 04/25/23 1151 05/30/23 1944  BNP 439.5* 1,246.8* 2,426.1*    ProBNP (last 3 results) No results for input(s): "PROBNP" in the last 8760 hours.  D-Dimer No results for input(s): "DDIMER" in the last 72 hours. Hemoglobin A1C No results for input(s): "HGBA1C" in the last 72 hours. Fasting Lipid Panel No results for input(s): "CHOL", "HDL", "LDLCALC", "TRIG", "CHOLHDL", "LDLDIRECT" in the last 72 hours. Thyroid Function Tests No results for input(s): "TSH", "T4TOTAL", "T3FREE", "THYROIDAB" in the last 72 hours.  Invalid input(s): "FREET3"  Other results:  Imaging  No results found.   Medications:   Scheduled Medications:  apixaban  2.5 mg Oral BID   Chlorhexidine Gluconate Cloth  6 each Topical Daily   feeding supplement  237 mL Oral TID BM   insulin aspart  0-15 Units Subcutaneous TID WC   insulin glargine  4 Units Subcutaneous Daily   lidocaine  1 patch Transdermal Q24H   mometasone-formoterol  2 puff Inhalation BID   polyethylene glycol  17 g Oral Daily   pravastatin  40 mg Oral Daily    senna-docusate  1 tablet Oral BID   sodium chloride flush  10-40 mL Intracatheter Q12H   sodium chloride flush  3 mL Intravenous Q12H    Infusions:  amiodarone 30 mg/hr (06/07/23 0700)   milrinone 0.375 mcg/kg/min (06/07/23 0700)   norepinephrine (LEVOPHED) Adult infusion 12 mcg/min (06/07/23 0738)    PRN Medications: acetaminophen **OR** acetaminophen, albuterol, alum & mag hydroxide-simeth, Muscle Rub, sodium chloride flush, trimethobenzamide  Assessment/Plan  1. Acute on chronic systolic CHF: Cardiolite in 9/20 showed EF 43%, ischemic cardiomyopathy with anteroapical infarction.  In setting of AF/RVR in 2/25, TEE showed EF down to EF 20-25% with moderate RV dysfunction, mild-moderate MR.  Patient is now admitted with NYHA IV symptoms, cardiorenal syndrome with creatinine up to 2.57. 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  Significant volume overload on admission. Initial lactate 7.2, started on milrinone. NE later added for BP support.  - this morning, remains on milrinone 0.375 + NE 12. Amio gtt 30. Co-ox 68% - 1.3 L in UOP yesterday w/ 80 IV Lasix + tolvaptan. CVP 14 today. BMP pending  - Hold IV Lasix today. Repeat tolvaptan if Na < 125  - Needs conversion to NSR (see below). - Not candidate for advanced therapies with age/frailty. With worsening renal function, I worry that he is nearing end stage. Will see how he does but expect we will need to involve palliative care.  Would not be a good HD candidate.  - Narrow QRS, not CRT candidate.   2. Atrial fibrillation: Persistent.  Successful DCCV in 2/25 but back in AF/RVR by 05/24/23.  He has been on po amiodarone. 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  - Amiodarone gtt at 30 mg/hr for now while on inotrope's - Continue apixaban.  - Aim for DCCV after fully diuresed. He has not missed any Eliquis doses,  does not need TEE.   3. CAD: H/o CABG 2008 with LIMA-LAD, seq SVG-OM1/2, SVG-PDA.  No cath since CABG.  Cardiolite in 9/20 with anteroapical infarction.  No chest pain.  HS-TnI minimally elevated with no trend, suspect demand ischemia from volume overload.  - Think we can avoid coronary angiography in absence of chest pain with AKI.  - Not on ASA given Eliquis.  - Continue home pravastatin  4. AKI on CKD stage 3:  Suspect cardiorenal syndrome with low output HF.   -Creatinine 2.49> 2.99> 3.2> 3.3>3.6>3.4 today.  - Support CO with milrinone and NE - Would be poor HD candidate.  5. ID: Possible PNA on CXR.   - Completed empiric cefepime for possible PNA. Afebrile, WBC WNL now.   6. COPD: Prior smoker.   7. Hyponatremia: Hypervolemic hyponatremia - Fluid restrict - s/p Tolvaptan 3/16, 3/19 and 3/20.  - Obtain repeat BMP today   8. Elevated LFTs: Suspect shock liver. LFTs trending down.  - AST 2704>>92 today - ALT 1514>519 today.  - continue inotropic support    Length of Stay: [address], PA-C  06/07/2023, 9:09 AM  Advanced Heart Failure Team Pager [phone] (M-F; 7a - 5p)  Please contact [facility] for night-coverage after hours (5p -7a ) and weekends on [email or web address]   Agree with above.   Remains on NE 12 and milrinone 0.375.   Diuresed modestly with IV lasix.   Denies CP or SOB. Feels weak. Poor appetite   Remains in AF on IV amio and Eliquis  CVP 14 Co-ox 68%  Scr 3.6 -> 3.4    General:  Elderly No resp difficulty HEENT: normal Neck: supple. JVP to jaw Carotids 2+ bilat; no bruits. No lymphadenopathy or thryomegaly appreciated. Cor: Irreg + s3  Lungs: clear Abdomen: soft, nontender, nondistended. No hepatosplenomegaly. No bruits or masses. Good bowel sounds. Extremities: no cyanosis, clubbing, rash, edema Neuro: alert & orientedx3, cranial nerves grossly intact. moves all 4 extremities w/o difficulty. Affect pleasant  Remains on NE and milrinone.  Making very slow/little progress.   Will continue NE/milrinone for now to support renal recovery. Give tolvaptan for hyponatremia.   Will consult Palliative Care.   CRITICAL CARE Performed by: Arvilla Meres  Total critical care time: 45 minutes  Critical care time was exclusive of separately billable procedures and treating other patients.  Critical care was necessary to treat or prevent imminent or life-threatening deterioration.  Critical care was time spent personally by me (independent of midlevel providers or residents) on the following activities: development of treatment plan with patient and/or surrogate as well as nursing, discussions with consultants, evaluation of patient's response to treatment, examination of patient, obtaining history from patient or surrogate, ordering and performing treatments and interventions, ordering and review of laboratory studies, ordering and review of radiographic studies, pulse oximetry and re-evaluation of patient's condition.  Arvilla Meres, MD  2:44 PM

## 2023-06-07 NOTE — Inpatient Diabetes Management (Signed)
 Inpatient Diabetes Program Recommendations  AACE/ADA: New Consensus Statement on Inpatient Glycemic Control (2015)  Target Ranges:  Prepandial:   less than 140 mg/dL      Peak postprandial:   less than 180 mg/dL (1-2 hours)      Critically ill patients:  140 - 180 mg/dL   Lab Results  Component Value Date   GLUCAP 238 (H) 06/07/2023   HGBA1C 6.4 (H) 03/09/2023    Latest Reference Range & Units 06/06/23 07:19 06/06/23 11:12 06/06/23 15:29 06/06/23 19:58 06/06/23 23:55 06/07/23 04:18 06/07/23 08:16  Glucose-Capillary 70 - 99 mg/dL 562 (H) 130 (H) 865 (H) 80 168 (H) 170 (H) 238 (H)  (H): Data is abnormally high  Diabetes history: DM2 Outpatient Diabetes medications: Metformin 500 mg every day  Current orders for Inpatient glycemic control: Lantus 4 units, Novolog 0-9 units Q4H   Inpatient Diabetes Program Recommendations:   Please consider: -Increase Lantus to 6 units daily  Thank you, Jonathan Hong E. Joseth Weigel, RN, MSN, CDCES  Diabetes Coordinator Inpatient Glycemic Control Team Team Pager [phone] (8am-5pm) 06/07/2023 9:41 AM

## 2023-06-07 NOTE — Progress Notes (Signed)
 Physical Therapy Treatment Patient Details Name: [name] MRN: [id number] DOB: [birth year] Today's Date: 06/07/2023   History of Present Illness [name] is a 83 y.o. male who presents 05/31/23 with fatigue, shortness of breath, and abdominal bloating for the past week.  PMH: HTN, HLD, persistent a-fib, CAD s/p CABG, chronic systolic CHF, diabetes mellitus type 2, and COPD    PT Comments  Session focused on gait training to promote safe stair navigation in the home. Pt completed 9 total steps, requiring 1 seated rest break but performed each step with good eccentric control, speed, and safety. Additionally, pt continues to increase his activity tolerance and reported he walked multiple times today with less fatigue. While the pt continues to have difficulty with prolonged activity and some ambulation, he is showing gradual progress in all impairments. Given pt's PLOF and continued need for an AD, pt would benefit from OPPT follow up PT upon d/c. Pt would benefit from continued mobility with nurses and mobility specialists until d/c. Will continue to follow acutely.       If plan is discharge home, recommend the following: Assist for transportation;Help with stairs or ramp for entrance;A little help with walking and/or transfers;A little help with bathing/dressing/bathroom   Can travel by private Psychologist, clinical (4 wheels)    Recommendations for Other Services       Precautions / Restrictions Precautions Precautions: Fall Recall of Precautions/Restrictions: Intact Precaution/Restrictions Comments: watch O2 Restrictions Weight Bearing Restrictions Per Provider Order: No     Mobility  Bed Mobility Overal bed mobility: Modified Independent             General bed mobility comments: with HOB elevated    Transfers Overall transfer level: Needs assistance Equipment used: Rolling walker (2 wheels) Transfers: Sit to/from Stand Sit to  Stand: Supervision           General transfer comment: pt completes sit to stand from elevated bed. needs cueing for hand placement on seated surface    Ambulation/Gait Ambulation/Gait assistance: Contact guard assist Gait Distance (Feet): 80 Feet Assistive device: Rolling walker (2 wheels) Gait Pattern/deviations: Step-through pattern, Trunk flexed Gait velocity: wfl Gait velocity interpretation: >2.62 ft/sec, indicative of community ambulatory   General Gait Details: pt walks with slightly increased trunk flexion that increases with fatigue   Stairs Stairs: Yes Stairs assistance: Contact guard assist Stair Management: One rail Left, One rail Right, Step to pattern, Forwards Number of Stairs: 9 General stair comments: pt performed 2 bouts of stairs, 1st 3 up and down twice before rest break; 2nd 3 up and down once. Pt performs with step-to-pattern and mostly with R leading up and  L leading down   Wheelchair Mobility     Tilt Bed    Modified Rankin (Stroke Patients Only)       Balance Overall balance assessment: Needs assistance Sitting-balance support: No upper extremity supported, Feet supported Sitting balance-Leahy Scale: Good Sitting balance - Comments: pt sits EOB without LOB   Standing balance support: Bilateral upper extremity supported, During functional activity, No upper extremity supported, Single extremity supported Standing balance-Leahy Scale: Fair Standing balance comment: pt stands without UE support preparing and when finishing the stairs without LOB or directional leaning                            Communication Communication Communication: Impaired Factors Affecting Communication: Hearing impaired  Cognition Arousal: Alert Behavior During Therapy: WFL for tasks assessed/performed   PT - Cognitive impairments: No apparent impairments                         Following commands: Intact      Cueing Cueing Techniques:  Verbal cues  Exercises      General Comments General comments (skin integrity, edema, etc.): Pt's SpO2 decreased to around 80% on 3L by [place] and was increased to 4 L by [place]. Pt didn't report or display adverse symptoms      Pertinent Vitals/Pain Pain Assessment Pain Assessment: No/denies pain    Home Living                          Prior Function            PT Goals (current goals can now be found in the care plan section) Acute Rehab PT Goals Patient Stated Goal: home PT Goal Formulation: With patient/family Time For Goal Achievement: 06/15/23 Potential to Achieve Goals: Good Progress towards PT goals: Progressing toward goals    Frequency    Min 2X/week      PT Plan      Co-evaluation              AM-PAC PT "6 Clicks" Mobility   Outcome Measure  Help needed turning from your back to your side while in a flat bed without using bedrails?: None Help needed moving from lying on your back to sitting on the side of a flat bed without using bedrails?: None Help needed moving to and from a bed to a chair (including a wheelchair)?: A Little Help needed standing up from a chair using your arms (e.g., wheelchair or bedside chair)?: A Little Help needed to walk in hospital room?: A Little Help needed climbing 3-5 steps with a railing? : A Little 6 Click Score: 20    End of Session Equipment Utilized During Treatment: Gait belt;Oxygen Activity Tolerance: Patient tolerated treatment well Patient left: in bed;with call bell/phone within reach;with family/visitor present;with bed alarm set Nurse Communication: Mobility status PT Visit Diagnosis: Unsteadiness on feet (R26.81);Muscle weakness (generalized) (M62.81)     Time: 8657-8469 PT Time Calculation (min) (ACUTE ONLY): 24 min  Charges:    $Gait Training: 23-37 mins PT General Charges $$ ACUTE PT VISIT: 1 Visit                     [address], SPT    [place] 06/07/2023, 6:23 PM

## 2023-06-08 DIAGNOSIS — I4819 Other persistent atrial fibrillation: Secondary | ICD-10-CM | POA: Diagnosis not present

## 2023-06-08 DIAGNOSIS — I5023 Acute on chronic systolic (congestive) heart failure: Secondary | ICD-10-CM | POA: Diagnosis not present

## 2023-06-08 LAB — CBC
HCT: 30.7 % — ABNORMAL LOW (ref 39.0–52.0)
Hemoglobin: 10.8 g/dL — ABNORMAL LOW (ref 13.0–17.0)
MCH: 28.7 pg (ref 26.0–34.0)
MCHC: 35.2 g/dL (ref 30.0–36.0)
MCV: 81.6 fL (ref 80.0–100.0)
Platelets: 152 10*3/uL (ref 150–400)
RBC: 3.76 MIL/uL — ABNORMAL LOW (ref 4.22–5.81)
RDW: 13.4 % (ref 11.5–15.5)
WBC: 7.6 10*3/uL (ref 4.0–10.5)
nRBC: 0 % (ref 0.0–0.2)

## 2023-06-08 LAB — COOXEMETRY PANEL
Carboxyhemoglobin: 1.8 % — ABNORMAL HIGH (ref 0.5–1.5)
Methemoglobin: <0.7 % (ref 0.0–1.5)
O2 Saturation: 71.9 %
Total hemoglobin: 11 g/dL — ABNORMAL LOW (ref 12.0–16.0)

## 2023-06-08 LAB — BASIC METABOLIC PANEL
Anion gap: 15 (ref 5–15)
BUN: 93 mg/dL — ABNORMAL HIGH (ref 8–23)
CO2: 21 mmol/L — ABNORMAL LOW (ref 22–32)
Calcium: 7.7 mg/dL — ABNORMAL LOW (ref 8.9–10.3)
Chloride: 79 mmol/L — ABNORMAL LOW (ref 98–111)
Creatinine, Ser: 3.45 mg/dL — ABNORMAL HIGH (ref 0.61–1.24)
GFR, Estimated: 17 mL/min — ABNORMAL LOW (ref >60)
Glucose, Bld: 332 mg/dL — ABNORMAL HIGH (ref 70–99)
Potassium: 3.8 mmol/L (ref 3.5–5.1)
Sodium: 115 mmol/L — CL (ref 135–145)

## 2023-06-08 MED ORDER — FUROSEMIDE 10 MG/ML IJ SOLN
80.0000 mg | Freq: Once | INTRAMUSCULAR | Status: AC
  Administered 2023-06-08: 80 mg via INTRAVENOUS
  Filled 2023-06-08: qty 8

## 2023-06-08 MED ORDER — POTASSIUM CHLORIDE CRYS ER 20 MEQ PO TBCR
40.0000 meq | EXTENDED_RELEASE_TABLET | Freq: Once | ORAL | Status: AC
  Administered 2023-06-08: 40 meq via ORAL
  Filled 2023-06-08: qty 2

## 2023-06-08 MED ORDER — TOLVAPTAN 15 MG PO TABS
30.0000 mg | ORAL_TABLET | Freq: Once | ORAL | Status: AC
  Administered 2023-06-08: 30 mg via ORAL
  Filled 2023-06-08: qty 2

## 2023-06-08 NOTE — Progress Notes (Addendum)
 Patient ID: [name], male   DOB: 1940-05-12, 83 y.o.   MRN: [id number]     Advanced Heart Failure Rounding Note  Cardiologist: Chrystie Nose, MD  Chief Complaint: CHF Subjective:    On Milrinone 0.375 + NE 12. Co-ox 72%. SBP 90s  CVP 14-15  SNa 115 SCr 3.52 -> 3.45  Feels ok. No CP or SOB. Poor appetite  Remains in AF on IV amio    Objective:   Weight Range: 62.8 kg Body mass index is 22.35 kg/m.   Vital Signs:   Temp:  [97.5 F (36.4 C)-97.6 F (36.4 C)] 97.5 F (36.4 C) (03/22 0730) Pulse Rate:  [101-125] 110 (03/22 1000) Resp:  [12-27] 17 (03/22 1000) BP: (76-107)/(36-83) 99/78 (03/22 1000) SpO2:  [85 %-98 %] 88 % (03/22 1000) Weight:  [62.8 kg] 62.8 kg (03/22 0500) Last BM Date : 06/07/23  Weight change: Filed Weights   06/06/23 0500 06/07/23 0700 06/08/23 0500  Weight: 63.5 kg 59.7 kg 62.8 kg   Intake/Output:   Intake/Output Summary (Last 24 hours) at 06/08/2023 1223 Last data filed at 06/08/2023 0601 Gross per 24 hour  Intake 1679.26 ml  Output --  Net 1679.26 ml     Physical Exam   CVP 14  General:  elderly, mildly fatigued appearing, no respiratory distress  HEENT: normal Neck: supple. JVD 14 cm. Carotids 2+ bilat; no bruits. No lymphadenopathy or thyromegaly appreciated. Cor: PMI nondisplaced. Irregularly irregular rhythm and rate No rubs, gallops or murmurs. Lungs: decreased BS at the bases  Abdomen: soft, nontender, nondistended. No hepatosplenomegaly. No bruits or masses. Good bowel sounds. Extremities: no cyanosis, clubbing, rash, edema + TED hoses  Neuro: alert & oriented x 3, cranial nerves grossly intact. moves all 4 extremities w/o difficulty. Affect pleasant. GU: + Foley   Telemetry   Afib 110s (personally reviewed)  Labs    CBC Recent Labs    06/07/23 0516 06/08/23 0510  WBC 8.5 7.6  HGB 10.9* 10.8*  HCT 31.1* 30.7*  MCV 81.2 81.6  PLT 159 152   Basic Metabolic Panel Recent Labs    82/95/62 1025 06/08/23 0510   NA 119* 115*  K 3.1* 3.8  CL 80* 79*  CO2 24 21*  GLUCOSE 311* 332*  BUN 92* 93*  CREATININE 3.52* 3.45*  CALCIUM 8.1* 7.7*   Liver Function Tests Recent Labs    06/06/23 1331 06/07/23 0516  AST 145* 92*  ALT 666* 519*  ALKPHOS 64 56  BILITOT 0.9 0.8  PROT 7.1 6.1*  ALBUMIN 3.2* 2.8*   No results for input(s): "LIPASE", "AMYLASE" in the last 72 hours. Cardiac Enzymes No results for input(s): "CKTOTAL", "CKMB", "CKMBINDEX", "TROPONINI" in the last 72 hours.  BNP: BNP (last 3 results) Recent Labs    03/08/23 1105 04/25/23 1151 05/30/23 1944  BNP 439.5* 1,246.8* 2,426.1*    ProBNP (last 3 results) No results for input(s): "PROBNP" in the last 8760 hours.   D-Dimer No results for input(s): "DDIMER" in the last 72 hours. Hemoglobin A1C No results for input(s): "HGBA1C" in the last 72 hours. Fasting Lipid Panel No results for input(s): "CHOL", "HDL", "LDLCALC", "TRIG", "CHOLHDL", "LDLDIRECT" in the last 72 hours. Thyroid Function Tests No results for input(s): "TSH", "T4TOTAL", "T3FREE", "THYROIDAB" in the last 72 hours.  Invalid input(s): "FREET3"  Other results:  Imaging  No results found.   Medications:   Scheduled Medications:  apixaban  2.5 mg Oral BID   Chlorhexidine Gluconate Cloth  6 each Topical  Daily   insulin aspart  0-15 Units Subcutaneous TID WC   insulin glargine  6 Units Subcutaneous Daily   lidocaine  1 patch Transdermal Q24H   mometasone-formoterol  2 puff Inhalation BID   polyethylene glycol  17 g Oral Daily   pravastatin  40 mg Oral Daily   senna-docusate  1 tablet Oral BID   sodium chloride flush  10-40 mL Intracatheter Q12H   sodium chloride flush  3 mL Intravenous Q12H   tolvaptan  30 mg Oral Once    Infusions:  amiodarone 30 mg/hr (06/08/23 0700)   milrinone 0.375 mcg/kg/min (06/08/23 0700)   norepinephrine (LEVOPHED) Adult infusion 12 mcg/min (06/08/23 0700)    PRN Medications: acetaminophen **OR** acetaminophen,  albuterol, alum & mag hydroxide-simeth, Muscle Rub, sodium chloride flush, trimethobenzamide  Assessment/Plan  1. Acute on chronic systolic CHF -> cardiogenic shock: Cardiolite in 9/20 showed EF 43%, ischemic cardiomyopathy with anteroapical infarction.  In setting of AF/RVR in 2/25, TEE showed EF down to EF 20-25% with moderate RV dysfunction, mild-moderate MR.  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  Significant volume overload on admission. Initial lactate 7.2, started on milrinone. NE later added for BP support.  - Remains on mirlinone 0.375 + NE 12. Amio gtt 30. Co-ox 71% - Lasix on hold  - CVP up to 14. Will give lasix today + tolvaptan  - Not candidate for advanced therapies with age/frailty and renal failure - Making little to no progress after 2 weeks of aggressive care.  - Suspect this is truly end-stage. Have d/w him and his wife. Palliative Care consult called - Will give IV lasix and tolvaptan today  2. Atrial fibrillation: Persistent.  Successful DCCV in 2/25 but back in AF/RVR by 05/24/23.  He has been on po amiodarone. 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 Cardiac output restored with inotropes - Amiodarone gtt at 30 mg/hr for now while on inotrope's - Continue apixaban.   3. CAD: H/o CABG 2008 with LIMA-LAD, seq SVG-OM1/2, SVG-PDA.  No cath since CABG.  Cardiolite in 9/20 with anteroapical infarction.  No chest pain.  HS-TnI minimally elevated with no trend, suspect demand ischemia from volume overload.  - No evidence ACS  4. AKI on CKD stage 3:  Suspect cardiorenal syndrome with low output HF.   -Creatinine 2.49> 2.99> 3.2> 3.3>3.6>3.4 -> 3.5 today.  - Support CO with milrinone and NE - Not HD canddiate  5. ID: Possible PNA on CXR.   - Completed empiric cefepime  for possible PNA. Afebrile, WBC WNL now.   6. COPD: Prior smoker.   7. Hyponatremia: Hypervolemic hyponatremia - NA 116 - Fluid restrict - s/p Tolvaptan 3/16, 3/19 and 3/20.  - repeat today   8. Elevated LFTs: Suspect shock liver. LFTs trending down.  - AST 2704/ ALT 1514 on admit. Improving - continue inotropic support   9. Severe protein calorie malnutrition - Nutrition team involved. Taking supplements as able  10. DNR/DNI  Await Palliative Care consultation. Given lack of improvement despite ongoing inotropic support, at this point think only option is consideration of transition to Hospice Care.   I d/w him and his wife and they are very realistic and understanding but would like a little more time to see if he will get better which I agree with. We did discuss change in  code status and will make DNR/DNI as no role for heroic measures given his current condition.   CRITICAL CARE Performed by: Arvilla Meres  Total critical care time: 45 minutes  Critical care time was exclusive of separately billable procedures and treating other patients.  Critical care was necessary to treat or prevent imminent or life-threatening deterioration.  Critical care was time spent personally by me (independent of midlevel providers or residents) on the following activities: development of treatment plan with patient and/or surrogate as well as nursing, discussions with consultants, evaluation of patient's response to treatment, examination of patient, obtaining history from patient or surrogate, ordering and performing treatments and interventions, ordering and review of laboratory studies, ordering and review of radiographic studies, pulse oximetry and re-evaluation of patient's condition.   Length of Stay: 8  Arvilla Meres, MD  06/08/2023, 12:23 PM  Advanced Heart Failure Team Pager [phone] (M-F; 7a - 5p)  Please contact [facility] for night-coverage after hours (5p -7a ) and  weekends on [email or web address]

## 2023-06-08 NOTE — Plan of Care (Addendum)
 Patient has stated he just doesn't feel well today and is fatigued. Refused walking today. Encouraged to continue active ROM and turning frequently. Wife at bedside.

## 2023-06-09 DIAGNOSIS — Z515 Encounter for palliative care: Secondary | ICD-10-CM

## 2023-06-09 DIAGNOSIS — Z7189 Other specified counseling: Secondary | ICD-10-CM

## 2023-06-09 LAB — BASIC METABOLIC PANEL
Anion gap: 16 — ABNORMAL HIGH (ref 5–15)
Anion gap: 17 — ABNORMAL HIGH (ref 5–15)
BUN: 91 mg/dL — ABNORMAL HIGH (ref 8–23)
BUN: 98 mg/dL — ABNORMAL HIGH (ref 8–23)
CO2: 18 mmol/L — ABNORMAL LOW (ref 22–32)
CO2: 17 mmol/L — ABNORMAL LOW (ref 22–32)
Calcium: 7.6 mg/dL — ABNORMAL LOW (ref 8.9–10.3)
Calcium: 8.1 mg/dL — ABNORMAL LOW (ref 8.9–10.3)
Chloride: 77 mmol/L — ABNORMAL LOW (ref 98–111)
Chloride: 82 mmol/L — ABNORMAL LOW (ref 98–111)
Creatinine, Ser: 3.49 mg/dL — ABNORMAL HIGH (ref 0.61–1.24)
Creatinine, Ser: 3.77 mg/dL — ABNORMAL HIGH (ref 0.61–1.24)
GFR, Estimated: 17 mL/min — ABNORMAL LOW (ref >60)
GFR, Estimated: 15 mL/min — ABNORMAL LOW (ref >60)
Glucose, Bld: 355 mg/dL — ABNORMAL HIGH (ref 70–99)
Glucose, Bld: 164 mg/dL — ABNORMAL HIGH (ref 70–99)
Potassium: 4.3 mmol/L (ref 3.5–5.1)
Potassium: 4.5 mmol/L (ref 3.5–5.1)
Sodium: 111 mmol/L — CL (ref 135–145)
Sodium: 116 mmol/L — CL (ref 135–145)

## 2023-06-09 LAB — SODIUM
Sodium: 114 mmol/L — CL (ref 135–145)
Sodium: 116 mmol/L — CL (ref 135–145)
Sodium: 116 mmol/L — CL (ref 135–145)
Sodium: 116 mmol/L — CL (ref 135–145)
Sodium: 118 mmol/L — CL (ref 135–145)

## 2023-06-09 LAB — CBC
HCT: 31.6 % — ABNORMAL LOW (ref 39.0–52.0)
Hemoglobin: 11.1 g/dL — ABNORMAL LOW (ref 13.0–17.0)
MCH: 28.2 pg (ref 26.0–34.0)
MCHC: 35.1 g/dL (ref 30.0–36.0)
MCV: 80.4 fL (ref 80.0–100.0)
Platelets: 160 10*3/uL (ref 150–400)
RBC: 3.93 MIL/uL — ABNORMAL LOW (ref 4.22–5.81)
RDW: 13.5 % (ref 11.5–15.5)
WBC: 8.2 10*3/uL (ref 4.0–10.5)
nRBC: 0.2 % (ref 0.0–0.2)

## 2023-06-09 LAB — SODIUM, URINE, RANDOM: Sodium, Ur: <10 mmol/L

## 2023-06-09 LAB — OSMOLALITY, URINE: Osmolality, Ur: 273 mosm/kg — ABNORMAL LOW (ref 300–900)

## 2023-06-09 LAB — COOXEMETRY PANEL
Carboxyhemoglobin: 1 % (ref 0.5–1.5)
Methemoglobin: <0.7 % (ref 0.0–1.5)
O2 Saturation: 61.7 %
Total hemoglobin: 11.4 g/dL — ABNORMAL LOW (ref 12.0–16.0)

## 2023-06-09 LAB — OSMOLALITY
Osmolality: 280 mosm/kg (ref 275–295)
Osmolality: 282 mosm/kg (ref 275–295)

## 2023-06-09 MED ORDER — ORAL CARE MOUTH RINSE: 15.0000 mL | OROMUCOSAL | Status: DC | PRN

## 2023-06-09 MED ORDER — FUROSEMIDE 10 MG/ML IJ SOLN
160.0000 mg | Freq: Once | INTRAVENOUS | Status: AC
  Administered 2023-06-09: 160 mg via INTRAVENOUS
  Filled 2023-06-09: qty 16

## 2023-06-09 MED ORDER — ENSURE ENLIVE PO LIQD: 237.0000 mL | Freq: Two times a day (BID) | ORAL | Status: DC

## 2023-06-09 MED ORDER — SODIUM CHLORIDE 3 % IV SOLN
INTRAVENOUS | Status: DC
  Filled 2023-06-09 (×4): qty 500

## 2023-06-09 NOTE — Consult Note (Addendum)
 Palliative Medicine Inpatient Consult Note  Consulting Provider:  Deborha Payment   Reason for consult:   Palliative Care Consult Services Palliative Medicine Consult  Reason for Consult? end-stage heart failure, inability to wean off pressors   06/09/2023  HPI:  Per intake H&P --> [name] is a 83 y.o. male with medical history significant of HTN, HLD, persistent atrial fibrillation, CAD s/p CABG, chronic systolic CHF, diabetes mellitus type 2, and COPD who presents with fatigue, shortness of breath, and abdominal bloating for the past week. Being treated for cardiogenic shock. Palliative care has been asked to support additional goals of care conversations in the setting of end stage disease.  Clinical Assessment/Goals of Care:  *Please note that this is a verbal dictation therefore any spelling or grammatical errors are due to the "Dragon Medical One" system interpretation.  I have reviewed medical records including EPIC notes, labs and imaging, received report from bedside RN, assessed the patient who is sitting up in the chair eating oatmeal.    I met with Rozell Searing to further discuss diagnosis prognosis, GOC, EOL wishes, disposition and options.   I introduced Palliative Medicine as specialized medical care for people living with serious illness. It focuses on providing relief from the symptoms and stress of a serious illness. The goal is to improve quality of life for both the patient and the family.  Medical History Review and Understanding:  A review of patient's past medical history inclusive of A-fib, CAD, type 2 diabetes, COPD, hypertension, and advanced heart failure was completed.  Social History:  [name] is from [place] originally.  He and his wife, [name] have been married for the past 57 years.  They do not have children he shares that they do have a beagle named Kendal Hymen.  He enjoys watching television, movies, and sports.  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  [name] shares he does not have any particular denomination all religious affirmations.  Functional and Nutritional State:  Preceding hospitalization [name] shares that he was fully functional able to perform all BADLs and IADLs on his own.  His appetite has been variable.  Palliative Symptoms:  Identified to have decreased activity tolerance.  Advance Directives: A detailed discussion was had today regarding advanced directives.    Code Status:  Concepts specific to code status, artifical feeding and hydration, continued IV antibiotics and rehospitalization was had.  The difference between a aggressive medical intervention path  and a palliative comfort care path for this patient at this time was had.   [name] is an established DO NOT RESUSCITATE DO NOT INTUBATE CODE STATUS at this time.  Discussion:  [name] shares that he has spoken to Dr. Gala Romney who has been quite candid regarding his long-term prognosis. He is aware of his poor long term prognosis in the setting of his cardiogenic shock and advanced heart failure.   [name] and I reviewed the options given that patients current condition will likely neglect to improve. I discussed with Parmvir what comfort care is. I also talked about hospice care. At this time, [name] would like to wait another day or two prior to communicating with hospice.  I shared with [name] that I would be calling and speaking with his wife.   Discussed the importance of continued conversation with family and their  medical providers regarding overall plan of care and treatment options, ensuring decisions are within the context of the patients values and GOCs. ____________________________ Addendum:  I spoke with [name] over the phone this morning. I shared the above conversation with her.  [name] would like to meet at some point to discuss options. She shares that she realizes how hard this is to come to  grips with though the advanced heart failure team has been candid about poor outcomes.  _______________________________ Addendum #2:  I met with [name] and how wife, [name] at bedside this afternoon. We reviewed Palliative care and my role with them. We discussed options moving forward and the differences between inpatient comfort care, hospice home placement, and placement at home with hospice.   Plan for patient and his wife to meet with a hospice liaison this afternoon to learn more about options.   Add Time: 30 minutes  Decision Maker: [name],Brenda (Spouse): [phone] (Mobile)   SUMMARY OF RECOMMENDATIONS   DNAR/DNI  Open and honest conversations held in the setting of [name]'s advanced disease  Have discussed comfort care as well as hospice  Appreciate TOC coordinating with [facility] liaison to meet with patient and spouse   Ongoing PMT support  Code Status/Advance Care Planning: DNAR/DNI  Palliative Prophylaxis:  Aspiration, Bowel Regimen, Delirium Protocol, Frequent Pain Assessment, Oral Care, Palliative Wound Care, and Turn Reposition  Additional Recommendations (Limitations, Scope, Preferences): Continue current care  Psycho-social/Spiritual:  Desire for further Chaplaincy support: No Additional Recommendations: Education on advanced heart failure   Prognosis: Exceptionally limited.  Discharge Planning: To be determined.  Vitals:   06/09/23 0645 06/09/23 0700  BP: 100/69 94/79  Pulse: (!) 110 (!) 113  Resp: 18 19  Temp:    SpO2: 93% 95%    Intake/Output Summary (Last 24 hours) at 06/09/2023 0703 Last data filed at 06/09/2023 0600 Gross per 24 hour  Intake 2122.74 ml  Output 425 ml  Net 1697.74 ml   Last Weight  Most recent update: 06/09/2023  6:54 AM    Weight  63.8 kg (140 lb 10.5 oz)            Gen:  Frail elderly M chronically ill appearing HEENT: moist mucous membranes CV: Irregular rate and rhythm  PULM: On 2LPM [place], breathing is even  and nonlabored ABD: soft/nontender  Neuro: Alert and oriented x3   PPS: 40%   This conversation/these recommendations were discussed with patient primary care team, Dr. Gala Romney  Billing based on MDM: High ______________________________________________________ Lamarr Lulas Shepherd Palliative Medicine Team Team Cell Phone: [phone] Please utilize secure chat with additional questions, if there is no response within 30 minutes please call the above phone number  Palliative Medicine Team providers are available by phone from 7am to 7pm daily and can be reached through the team cell phone.  Should this patient require assistance outside of these hours, please call the patient's attending physician.

## 2023-06-09 NOTE — Progress Notes (Signed)
 Patient ID: [name], male   DOB: Dec 01, 1940, 83 y.o.   MRN: [id number]     Advanced Heart Failure Rounding Note  Cardiologist: Chrystie Nose, MD  Chief Complaint: CHF Subjective:    On Milrinone 0.375 + NE 12. Co-ox 62%.SBP 90s CVP 18-19  SNa 115 -> 111 (despite tolvaptan)  SCr 3.52 -> 3.45 -> 3.49  Feels weak. No CP or SOB.   Received IV lasix yesterday. < 500 cc urine output   Objective:   Weight Range: 63.8 kg Body mass index is 22.7 kg/m.   Vital Signs:   Temp:  [97.5 F (36.4 C)-98.6 F (37 C)] 97.6 F (36.4 C) (03/23 0745) Pulse Rate:  [101-118] 106 (03/23 1000) Resp:  [11-21] 17 (03/23 1000) BP: (84-108)/(48-93) 98/63 (03/23 1000) SpO2:  [77 %-97 %] 94 % (03/23 1000) Weight:  [63.8 kg] 63.8 kg (03/23 0500) Last BM Date : 06/08/23  Weight change: Filed Weights   06/07/23 0700 06/08/23 0500 06/09/23 0500  Weight: 59.7 kg 62.8 kg 63.8 kg   Intake/Output:   Intake/Output Summary (Last 24 hours) at 06/09/2023 1047 Last data filed at 06/09/2023 0944 Gross per 24 hour  Intake 1971.62 ml  Output 425 ml  Net 1546.62 ml     Physical Exam   General:  Elderly male lying in bed. Weak appearing No resp difficulty HEENT: normal Neck: supple. JVP to ear Carotids 2+ bilat; no bruits. No lymphadenopathy or thryomegaly appreciated. Cor: Irregular rate & rhythm. No rubs, gallops or murmurs. Lungs: clear Abdomen: soft, nontender, + distended. No hepatosplenomegaly. No bruits or masses. Good bowel sounds. Extremities: no cyanosis, clubbing, rash, tr edema Neuro: alert & orientedx3, cranial nerves grossly intact. moves all 4 extremities w/o difficulty. Affect pleasant  Telemetry   Afib 100-110 Personally reviewed   Labs    CBC Recent Labs    06/08/23 0510 06/09/23 0547  WBC 7.6 8.2  HGB 10.8* 11.1*  HCT 30.7* 31.6*  MCV 81.6 80.4  PLT 152 160   Basic Metabolic Panel Recent Labs    40/98/11 0510 06/09/23 0547  NA 115* 111*  K 3.8 4.3  CL 79*  77*  CO2 21* 18*  GLUCOSE 332* 355*  BUN 93* 91*  CREATININE 3.45* 3.49*  CALCIUM 7.7* 7.6*   Liver Function Tests Recent Labs    06/06/23 1331 06/07/23 0516  AST 145* 92*  ALT 666* 519*  ALKPHOS 64 56  BILITOT 0.9 0.8  PROT 7.1 6.1*  ALBUMIN 3.2* 2.8*   No results for input(s): "LIPASE", "AMYLASE" in the last 72 hours. Cardiac Enzymes No results for input(s): "CKTOTAL", "CKMB", "CKMBINDEX", "TROPONINI" in the last 72 hours.  BNP: BNP (last 3 results) Recent Labs    03/08/23 1105 04/25/23 1151 05/30/23 1944  BNP 439.5* 1,246.8* 2,426.1*    ProBNP (last 3 results) No results for input(s): "PROBNP" in the last 8760 hours.   D-Dimer No results for input(s): "DDIMER" in the last 72 hours. Hemoglobin A1C No results for input(s): "HGBA1C" in the last 72 hours. Fasting Lipid Panel No results for input(s): "CHOL", "HDL", "LDLCALC", "TRIG", "CHOLHDL", "LDLDIRECT" in the last 72 hours. Thyroid Function Tests No results for input(s): "TSH", "T4TOTAL", "T3FREE", "THYROIDAB" in the last 72 hours.  Invalid input(s): "FREET3"  Other results:  Imaging  No results found.   Medications:   Scheduled Medications:  apixaban  2.5 mg Oral BID   Chlorhexidine Gluconate Cloth  6 each Topical Daily   insulin aspart  0-15 Units Subcutaneous  TID WC   insulin glargine  6 Units Subcutaneous Daily   lidocaine  1 patch Transdermal Q24H   mometasone-formoterol  2 puff Inhalation BID   polyethylene glycol  17 g Oral Daily   pravastatin  40 mg Oral Daily   senna-docusate  1 tablet Oral BID   sodium chloride flush  10-40 mL Intracatheter Q12H   sodium chloride flush  3 mL Intravenous Q12H    Infusions:  amiodarone 30 mg/hr (06/09/23 0900)   milrinone 0.375 mcg/kg/min (06/09/23 0900)   norepinephrine (LEVOPHED) Adult infusion 9 mcg/min (06/09/23 1040)   sodium chloride (hypertonic) 40 mL/hr at 06/09/23 0900    PRN Medications: acetaminophen **OR** acetaminophen, albuterol,  alum & mag hydroxide-simeth, Muscle Rub, sodium chloride flush, trimethobenzamide  Assessment/Plan   1. Acute on chronic systolic CHF -> cardiogenic shock:  - Cardiolite 9/20 sEF 43%, ischemic cardiomyopathy with anteroapical infarction.  - Echo 2/25 EF 20-25% in setting of AF/RVR in 2/25, - admitted with profound shock with LA 7.2 and shock liver/kidney - started on milrinone. NE later added for BP support.  - Remains on mirlinone 0.375 + NE 12. Amio gtt 30. Co-ox 62% - Making little to no progress after 2 weeks of aggressive care. Continues with severe hyponatremia (confounded by hyperglycemia - see below) - Suspect this is truly end-stage. Have d/w him and his wife. Palliative Care consult. Initial visit this am - Give lasix 160 IV x 1 today  2. Atrial fibrillation: Persistent.  Successful DCCV in 2/25 but back in AF/RVR by 05/24/23.  He has been on po amiodarone. 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 Cardiac output restored with inotropes - Amiodarone gtt at 30 mg/hr for now while on inotrope. No role for DC-CV  - Continue apixaban.   3. CAD: H/o CABG 2008 with LIMA-LAD, seq SVG-OM1/2, SVG-PDA.  No cath since CABG.  Cardiolite in 9/20 with anteroapical infarction.  - No chest pain.  HS-TnI minimally elevated with no trend, suspect demand ischemia from volume overload.  - not cath candidate with AKI  4. AKI on CKD stage 3:  Suspect cardiorenal syndrome with low output HF.   -Creatinine 2.49> 2.99> 3.2> 3.3>3.6>3.4 -> 3.5 -> 3.5 today - no improvement over the past 1-2 weeks despite support CO with milrinone and NE - Not HD canddiate  5. ID: Possible PNA on CXR.   - Completed empiric cefepime for possible PNA. Afebrile, WBC WNL now.   6. COPD: Prior smoker.   7. Hyponatremia: Hypervolemic hyponatremia - Na 111 today - however this was drawn from PICC with existing infusions not stopped - Fluid restrict - s/p Tolvaptan 3/16, 3/19, 3/20 and  3/22 - have started 3% saline at 40/hr - will get peripheral stick to recheck  8. Elevated LFTs: Suspect shock liver. LFTs trending down.  - AST 2704/ ALT 1514 on admit. Improving - continue inotropic support   9. DNR/DNI  Given lack of improvement despite ongoing inotropic support, at this point think only option is consideration of transition to Hospice Care.   I d/w him and his wife and they are very realistic and understanding but would like a little more time to see if he will get better which I agree with. We did discuss change in code status and will make DNR/DNI as no role for heroic measures given his current condition.   Palliative Care has seen today and discussions ongoing.   CRITICAL CARE Performed by: Arvilla Meres  Total  critical care time: 50 minutes  Critical care time was exclusive of separately billable procedures and treating other patients.  Critical care was necessary to treat or prevent imminent or life-threatening deterioration.  Critical care was time spent personally by me (independent of midlevel providers or residents) on the following activities: development of treatment plan with patient and/or surrogate as well as nursing, discussions with consultants, evaluation of patient's response to treatment, examination of patient, obtaining history from patient or surrogate, ordering and performing treatments and interventions, ordering and review of laboratory studies, ordering and review of radiographic studies, pulse oximetry and re-evaluation of patient's condition.   Length of Stay: 9  Arvilla Meres, MD  06/09/2023, 10:47 AM  Advanced Heart Failure Team Pager [phone] (M-F; 7a - 5p)  Please contact [facility] for night-coverage after hours (5p -7a ) and weekends on [email or web address]

## 2023-06-09 NOTE — Plan of Care (Signed)
  Problem: Education: Goal: Knowledge of General Education information will improve Description: Including pain rating scale, medication(s)/side effects and non-pharmacologic comfort measures Outcome: Progressing   Problem: Health Behavior/Discharge Planning: Goal: Ability to manage health-related needs will improve Outcome: Progressing   Problem: Clinical Measurements: Goal: Will remain free from infection Outcome: Progressing   Problem: Coping: Goal: Level of anxiety will decrease Outcome: Progressing   Problem: Elimination: Goal: Will not experience complications related to bowel motility Outcome: Progressing Goal: Will not experience complications related to urinary retention Outcome: Progressing   Problem: Pain Managment: Goal: General experience of comfort will improve and/or be controlled Outcome: Progressing   Problem: Safety: Goal: Ability to remain free from injury will improve Outcome: Progressing   Problem: Skin Integrity: Goal: Risk for impaired skin integrity will decrease Outcome: Progressing   Problem: Education: Goal: Knowledge of disease or condition will improve Outcome: Progressing Goal: Understanding of medication regimen will improve Outcome: Progressing

## 2023-06-09 NOTE — Progress Notes (Signed)
 [facility] Liaison Note   Received request for family interest in hospice inpatient unit vs home with hospice. Visited patient and spoke with wife at bedside to discuss services and hospice philosophy of care. Provided information on inpatient unit and criteria as well as home with hospice.   At this time, patient has not transitioned to full comfort care. States they will be making that decision in a day or two.    We would be happy to assess for inpatient unit appropriateness and/or home with hospice when patient and wife make a decision to move forward with comfort care measures.     Thank you for the opportunity to participate in this patient's care.    Roe Rutherford, BSN, [facility] Liaison [phone]

## 2023-06-10 DIAGNOSIS — R570 Cardiogenic shock: Secondary | ICD-10-CM | POA: Diagnosis not present

## 2023-06-10 DIAGNOSIS — N179 Acute kidney failure, unspecified: Secondary | ICD-10-CM | POA: Diagnosis not present

## 2023-06-10 DIAGNOSIS — Z7189 Other specified counseling: Secondary | ICD-10-CM | POA: Diagnosis not present

## 2023-06-10 DIAGNOSIS — N183 Chronic kidney disease, stage 3 unspecified: Secondary | ICD-10-CM

## 2023-06-10 DIAGNOSIS — Z515 Encounter for palliative care: Secondary | ICD-10-CM | POA: Diagnosis not present

## 2023-06-10 DIAGNOSIS — I5023 Acute on chronic systolic (congestive) heart failure: Secondary | ICD-10-CM | POA: Diagnosis not present

## 2023-06-10 LAB — CBC
HCT: 31.2 % — ABNORMAL LOW (ref 39.0–52.0)
Hemoglobin: 10.7 g/dL — ABNORMAL LOW (ref 13.0–17.0)
MCH: 28.4 pg (ref 26.0–34.0)
MCHC: 34.3 g/dL (ref 30.0–36.0)
MCV: 82.8 fL (ref 80.0–100.0)
Platelets: 170 10*3/uL (ref 150–400)
RBC: 3.77 MIL/uL — ABNORMAL LOW (ref 4.22–5.81)
RDW: 13.6 % (ref 11.5–15.5)
WBC: 7.5 10*3/uL (ref 4.0–10.5)
nRBC: 0 % (ref 0.0–0.2)

## 2023-06-10 LAB — GLUCOSE, CAPILLARY
Glucose-Capillary: 104 mg/dL — ABNORMAL HIGH (ref 70–99)
Glucose-Capillary: 111 mg/dL — ABNORMAL HIGH (ref 70–99)
Glucose-Capillary: 116 mg/dL — ABNORMAL HIGH (ref 70–99)
Glucose-Capillary: 132 mg/dL — ABNORMAL HIGH (ref 70–99)
Glucose-Capillary: 133 mg/dL — ABNORMAL HIGH (ref 70–99)
Glucose-Capillary: 134 mg/dL — ABNORMAL HIGH (ref 70–99)
Glucose-Capillary: 137 mg/dL — ABNORMAL HIGH (ref 70–99)
Glucose-Capillary: 138 mg/dL — ABNORMAL HIGH (ref 70–99)
Glucose-Capillary: 148 mg/dL — ABNORMAL HIGH (ref 70–99)
Glucose-Capillary: 158 mg/dL — ABNORMAL HIGH (ref 70–99)
Glucose-Capillary: 160 mg/dL — ABNORMAL HIGH (ref 70–99)
Glucose-Capillary: 170 mg/dL — ABNORMAL HIGH (ref 70–99)
Glucose-Capillary: 181 mg/dL — ABNORMAL HIGH (ref 70–99)
Glucose-Capillary: 187 mg/dL — ABNORMAL HIGH (ref 70–99)
Glucose-Capillary: 202 mg/dL — ABNORMAL HIGH (ref 70–99)
Glucose-Capillary: 228 mg/dL — ABNORMAL HIGH (ref 70–99)
Glucose-Capillary: 233 mg/dL — ABNORMAL HIGH (ref 70–99)
Glucose-Capillary: 146 mg/dL — ABNORMAL HIGH (ref 70–99)

## 2023-06-10 LAB — COMPREHENSIVE METABOLIC PANEL
ALT: 256 U/L — ABNORMAL HIGH (ref 0–44)
AST: 39 U/L (ref 15–41)
Albumin: 2.9 g/dL — ABNORMAL LOW (ref 3.5–5.0)
Alkaline Phosphatase: 57 U/L (ref 38–126)
Anion gap: 14 (ref 5–15)
BUN: 95 mg/dL — ABNORMAL HIGH (ref 8–23)
CO2: 16 mmol/L — ABNORMAL LOW (ref 22–32)
Calcium: 7.6 mg/dL — ABNORMAL LOW (ref 8.9–10.3)
Chloride: 91 mmol/L — ABNORMAL LOW (ref 98–111)
Creatinine, Ser: 3.66 mg/dL — ABNORMAL HIGH (ref 0.61–1.24)
GFR, Estimated: 16 mL/min — ABNORMAL LOW (ref >60)
Glucose, Bld: 240 mg/dL — ABNORMAL HIGH (ref 70–99)
Potassium: 4.1 mmol/L (ref 3.5–5.1)
Sodium: 121 mmol/L — ABNORMAL LOW (ref 135–145)
Total Bilirubin: 0.9 mg/dL (ref 0.0–1.2)
Total Protein: 6.1 g/dL — ABNORMAL LOW (ref 6.5–8.1)

## 2023-06-10 LAB — COOXEMETRY PANEL
Carboxyhemoglobin: 1.1 % (ref 0.5–1.5)
Methemoglobin: <0.7 % (ref 0.0–1.5)
O2 Saturation: 57.9 %
Total hemoglobin: 11.1 g/dL — ABNORMAL LOW (ref 12.0–16.0)

## 2023-06-10 LAB — PROTIME-INR
INR: 2.4 — ABNORMAL HIGH (ref 0.8–1.2)
Prothrombin Time: 26.6 s — ABNORMAL HIGH (ref 11.4–15.2)

## 2023-06-10 LAB — SODIUM
Sodium: 120 mmol/L — ABNORMAL LOW (ref 135–145)
Sodium: 121 mmol/L — ABNORMAL LOW (ref 135–145)

## 2023-06-10 MED ORDER — GLYCOPYRROLATE 0.2 MG/ML IJ SOLN: 0.2000 mg | INTRAMUSCULAR | Status: DC | PRN

## 2023-06-10 MED ORDER — METOLAZONE 5 MG PO TABS
10.0000 mg | ORAL_TABLET | Freq: Once | ORAL | Status: AC
  Administered 2023-06-10: 10 mg via ORAL
  Filled 2023-06-10: qty 2

## 2023-06-10 MED ORDER — MIDODRINE HCL 5 MG PO TABS
10.0000 mg | ORAL_TABLET | Freq: Three times a day (TID) | ORAL | Status: DC
  Administered 2023-06-10: 10 mg via ORAL
  Filled 2023-06-10: qty 2

## 2023-06-10 MED ORDER — GLYCOPYRROLATE 1 MG PO TABS: 1.0000 mg | ORAL_TABLET | ORAL | Status: DC | PRN

## 2023-06-10 MED ORDER — FUROSEMIDE 10 MG/ML IJ SOLN
200.0000 mg | Freq: Two times a day (BID) | INTRAVENOUS | Status: DC
  Administered 2023-06-10: 200 mg via INTRAVENOUS
  Filled 2023-06-10 (×3): qty 20

## 2023-06-10 MED ORDER — ACETAZOLAMIDE 250 MG PO TABS
500.0000 mg | ORAL_TABLET | Freq: Two times a day (BID) | ORAL | Status: DC
  Administered 2023-06-10: 500 mg via ORAL
  Filled 2023-06-10: qty 2

## 2023-06-10 MED ORDER — POLYVINYL ALCOHOL 1.4 % OP SOLN: 1.0000 [drp] | Freq: Four times a day (QID) | OPHTHALMIC | Status: DC | PRN

## 2023-06-10 MED ORDER — ACETAMINOPHEN 650 MG RE SUPP: 650.0000 mg | Freq: Four times a day (QID) | RECTAL | Status: DC | PRN

## 2023-06-10 MED ORDER — ACETAMINOPHEN 325 MG PO TABS: 650.0000 mg | ORAL_TABLET | Freq: Four times a day (QID) | ORAL | Status: DC | PRN

## 2023-06-10 MED ORDER — FUROSEMIDE 10 MG/ML IJ SOLN
160.0000 mg | Freq: Two times a day (BID) | INTRAVENOUS | Status: DC
  Filled 2023-06-10 (×2): qty 16

## 2023-06-10 MED ORDER — MORPHINE SULFATE (PF) 2 MG/ML IV SOLN
2.0000 mg | INTRAVENOUS | Status: DC | PRN
  Administered 2023-06-10: 2 mg via INTRAVENOUS
  Filled 2023-06-10: qty 1

## 2023-06-10 MED ORDER — SODIUM CHLORIDE 0.9% FLUSH: 3.0000 mL | Freq: Two times a day (BID) | INTRAVENOUS | Status: DC

## 2023-06-10 MED ORDER — SODIUM CHLORIDE 0.9% FLUSH: 3.0000 mL | INTRAVENOUS | Status: DC | PRN

## 2023-06-10 MED ORDER — MIDAZOLAM HCL 2 MG/2ML IJ SOLN
2.0000 mg | INTRAMUSCULAR | Status: DC | PRN
  Administered 2023-06-10: 2 mg via INTRAVENOUS
  Filled 2023-06-10: qty 2

## 2023-06-10 MED ORDER — MORPHINE BOLUS VIA INFUSION
5.0000 mg | INTRAVENOUS | Status: DC | PRN
  Administered 2023-06-10: 4 mg via INTRAVENOUS

## 2023-06-10 MED ORDER — MIDODRINE HCL 5 MG PO TABS: 10.0000 mg | ORAL_TABLET | Freq: Three times a day (TID) | ORAL | Status: DC

## 2023-06-10 MED ORDER — MORPHINE 100MG IN NS 100ML (1MG/ML) PREMIX INFUSION
0.0000 mg/h | INTRAVENOUS | Status: DC
  Administered 2023-06-10: 1 mg/h via INTRAVENOUS
  Filled 2023-06-10: qty 100

## 2023-06-10 NOTE — Progress Notes (Addendum)
 Seen on afternoon rounds.  He's had minimal UOP today despite high dose diuretics. Remains hyponatremic with Na of 121. Scr 3.66 and CO2 16.  BP soft 85/65 on 8 NE, 0.125 milrinone and 10 midodrine TID.  He is more fatigued and short of breath this afternoon with generalized discomfort.   Unfortunately, it is very unlikely we will be able to get him home safely. Dr. Elwyn Lade discussed this with patient and his wife. They are considering transition to comfort measures but want to discuss with family first.   In the meantime, order placed for 2-4 mg morphine IV q 1 hr PRN.

## 2023-06-10 NOTE — Progress Notes (Signed)
 eLink Physician-Brief Progress Note Patient Name: [name] DOB: [birth year] MRN: [id number]   Date of Service  06/10/2023  HPI/Events of Note   83 y.o. male with medical history significant of HTN, HLD, persistent atrial fibrillation, CAD s/p CABG, chronic systolic CHF, diabetes mellitus type 2, and COPD who presents with fatigue, shortness of breath, and abdominal bloating for the past week. Being treated for cardiogenic shock  Notified about critical sodium, seems to be stable and now transition to 118.  On 3% saline.  Anticipating transition to comfort measures over the next few days  eICU Interventions  Increase 3% saline to 60 cc an hour, goal sodium 6 AM around 122-124     Intervention Category Intermediate Interventions: Electrolyte abnormality - evaluation and management  [name] 06/10/2023, 12:04 AM

## 2023-06-10 NOTE — Progress Notes (Signed)
 Dr Elwyn Lade and Lillia Abed PA at bedside. Weaning norepi as tolerated but it remains at 8 mcgs. He discussed current status and VS. Likely progressing towards comfort care. Wife and patient verbalize understanding.

## 2023-06-10 NOTE — TOC Progression Note (Signed)
 Transition of Care [facility]) - Progression Note    Patient Details  Name: ASAR EVILSIZER MRN: [id number] Date of Birth: [birth year]  [facility] of [facility] CM/SW Contact  Elliot Cousin, RN Phone Number: [phone] 06/10/2023, 12:07 PM  Clinical Narrative:     TOC CM spoke to pt and wife at bedside. Offered choice for Home Hospice. Medicare.gov list with rating placed on chart and provided to pt/wife. Wife requested [facility]. Referral sent to [facility] rep, Shawn. Pt will need portable to dc home. Wife states she prefers to take pt home. Will reassess closer to dc, may need PTAR/DNR form.   Expected Discharge Plan: Home w Hospice Care Barriers to Discharge: Continued Medical Work up  Expected Discharge Plan and Services   Discharge Planning Services: CM Consult Post Acute Care Choice: Hospice Living arrangements for the past 2 months: Single Family Home                           HH Arranged: RN HH Agency:  [facility]) Date [facility] Agency Contacted: 06/10/23 Time HH Agency Contacted: 1206 Representative spoke with at [facility] Agency: Glenna Fellows   Social Determinants of Health (SDOH) Interventions SDOH Screenings   Food Insecurity: No Food Insecurity (05/31/2023)  Housing: Low Risk  (05/31/2023)  Transportation Needs: No Transportation Needs (05/31/2023)  Utilities: Not At Risk (05/31/2023)  Depression (PHQ2-9): Low Risk  (04/01/2023)  Social Connections: Moderately Isolated (05/31/2023)  Tobacco Use: Medium Risk (05/31/2023)    Readmission Risk Interventions     No data to display

## 2023-06-10 NOTE — Progress Notes (Signed)
 PT Cancellation Note  Patient Details Name: [name] MRN: [id number] DOB: 01-27-1941   Cancelled Treatment:    Reason Eval/Treat Not Completed: Other (comment). Pt politely declined due to fatigue. Aware pt is transitioning to hospice care. Acute PT to return as able, if desired by patient.  Lewis Shock, PT, DPT Acute Rehabilitation Services Secure chat preferred Office #: [phone]    Iona Hansen 06/10/2023, 1:23 PM

## 2023-06-10 NOTE — Progress Notes (Signed)
   Palliative Medicine Inpatient Follow Up Note HPI: [name] is a 83 y.o. male with medical history significant of HTN, HLD, persistent atrial fibrillation, CAD s/p CABG, chronic systolic CHF, diabetes mellitus type 2, and COPD who presents with fatigue, shortness of breath, and abdominal bloating for the past week. Being treated for cardiogenic shock. Palliative care has been asked to support additional goals of care conversations in the setting of end stage disease.   Today's Discussion 06/10/2023  *Please note that this is a verbal dictation therefore any spelling or grammatical errors are due to the "Dragon Medical One" system interpretation.  Chart reviewed inclusive of vital signs, progress notes, laboratory results, and diagnostic images.   I met with [name] and his wife, [name] this morning.   [name] shares that he feels less short of breath though is not motivated to do much of anything nor does he have an appetite.  Created space and opportunity for patient to explore thoughts feelings and fears regarding current medical situation. Discussed that Dr. Elwyn Lade has shared the plan to continue present treatment for a few days further.  Patients ideal goal is to get home with hospice. We reviewed the "what if's" of if he were to be weaned off medications and have increased symptom burden. We reviewed that even if things are set up for him to go home this is a plan that can always be modified.   Patient and his wife and looking forward to speaking with [facility] today in regards to the above questions also.   Questions and concerns addressed/Palliative Support Provided.   Objective Assessment: Vital Signs Vitals:   06/10/23 1100 06/10/23 1115  BP: 97/63 98/72  Pulse: (!) 106 (!) 119  Resp: 18 17  Temp: 97.6 F (36.4 C)   SpO2: 94% 95%    Intake/Output Summary (Last 24 hours) at 06/10/2023 1131 Last data filed at 06/10/2023 0800 Gross per 24 hour  Intake 2663.69 ml   Output 525 ml  Net 2138.69 ml   Last Weight  Most recent update: 06/10/2023  5:15 AM    Weight  65.6 kg (144 lb 10 oz)            Gen:  Frail elderly M chronically ill appearing HEENT: moist mucous membranes CV: Irregular rate and rhythm  PULM: On 5LPM [place], breathing is even and nonlabored ABD: soft/nontender  Neuro: Alert and oriented x3   SUMMARY OF RECOMMENDATIONS   DNAR/DNI   HF working on weaning patient off vasodialators   [facility] plan to meet with patient and his wife again today  Patient would ideally like to get home with hospice   Ongoing PMT support  Billing based on MDM: High in the setting of complex decision making to escalate vs de-escalation of care ______________________________________________________________________________________ [name] Palliative Medicine Team Team Cell Phone: [phone] Please utilize secure chat with additional questions, if there is no response within 30 minutes please call the above phone number  Palliative Medicine Team providers are available by phone from 7am to 7pm daily and can be reached through the team cell phone.  Should this patient require assistance outside of these hours, please call the patient's attending physician.

## 2023-06-10 NOTE — Progress Notes (Signed)
 Patient ID: [name], male   DOB: [birth year], 83 y.o.   MRN: [id number]     Advanced Heart Failure Rounding Note  Cardiologist: Chrystie Nose, MD  Chief Complaint: CHF Subjective:    On Milrinone 0.375 + NE 12. Co-ox 57%.SBP 90s CVP 19-20  Serum sodium now 121 after increase in 3% NaCL  Feels similar to yesterday. Long discussion with wife and patient today about his wishes. We discussed that in all likelihood he is at the end of his life and hospice care is appropriate. Currently, he would like to do all that is necessary to return home.   Given this, will decrease milrinone to 0.174mcg/kg/min given significantly vasodilated state, start midodrine for BP assistance, and attempt to wean NE over the next 24-48 hours. If he responds, he will likely have a very short window with which to get home.Discussed with palliative care team.     Objective:   Weight Range: 65.6 kg Body mass index is 23.34 kg/m.   Vital Signs:   Temp:  [92.2 F (33.4 C)-97.5 F (36.4 C)] 95.1 F (35.1 C) (03/24 0400) Pulse Rate:  [82-134] 114 (03/24 0800) Resp:  [12-33] 15 (03/24 0800) BP: (76-102)/(52-80) 101/72 (03/24 0800) SpO2:  [82 %-99 %] 94 % (03/24 0800) Weight:  [65.6 kg] 65.6 kg (03/24 0500) Last BM Date : 06/09/23  Weight change: Filed Weights   06/08/23 0500 06/09/23 0500 06/10/23 0500  Weight: 62.8 kg 63.8 kg 65.6 kg   Intake/Output:   Intake/Output Summary (Last 24 hours) at 06/10/2023 1059 Last data filed at 06/10/2023 0800 Gross per 24 hour  Intake 2783.69 ml  Output 525 ml  Net 2258.69 ml     Physical Exam   General:  Elderly male lying in bed. Weak appearing No resp difficulty HEENT: normal Neck: supple. JVP to ear  Cor: Irregular rate & rhythm. No rubs, gallops or murmurs. Lungs: clear Abdomen: soft, nontender, + distended.  Extremities: no cyanosis, clubbing, rash, tr edema Neuro: alert & oriented x3  Telemetry   Afib 100-110 Personally reviewed   Labs     CBC Recent Labs    06/09/23 0547 06/10/23 0508  WBC 8.2 7.5  HGB 11.1* 10.7*  HCT 31.6* 31.2*  MCV 80.4 82.8  PLT 160 170   Basic Metabolic Panel Recent Labs    40/98/11 0547 06/09/23 1050 06/09/23 1123 06/09/23 1340 06/10/23 0258 06/10/23 0635  NA 111*   < > 116*   < > 120* 121*  K 4.3  --  4.5  --   --   --   CL 77*  --  82*  --   --   --   CO2 18*  --  17*  --   --   --   GLUCOSE 355*  --  164*  --   --   --   BUN 91*  --  98*  --   --   --   CREATININE 3.49*  --  3.77*  --   --   --   CALCIUM 7.6*  --  8.1*  --   --   --    < > = values in this interval not displayed.   Liver Function Tests No results for input(s): "AST", "ALT", "ALKPHOS", "BILITOT", "PROT", "ALBUMIN" in the last 72 hours.  No results for input(s): "LIPASE", "AMYLASE" in the last 72 hours. Cardiac Enzymes No results for input(s): "CKTOTAL", "CKMB", "CKMBINDEX", "TROPONINI" in the last 72 hours.  BNP:  BNP (last 3 results) Recent Labs    03/08/23 1105 04/25/23 1151 05/30/23 1944  BNP 439.5* 1,246.8* 2,426.1*    ProBNP (last 3 results) No results for input(s): "PROBNP" in the last 8760 hours.   D-Dimer No results for input(s): "DDIMER" in the last 72 hours. Hemoglobin A1C No results for input(s): "HGBA1C" in the last 72 hours. Fasting Lipid Panel No results for input(s): "CHOL", "HDL", "LDLCALC", "TRIG", "CHOLHDL", "LDLDIRECT" in the last 72 hours. Thyroid Function Tests No results for input(s): "TSH", "T4TOTAL", "T3FREE", "THYROIDAB" in the last 72 hours.  Invalid input(s): "FREET3"  Other results:  Imaging  No results found.   Medications:   Scheduled Medications:  acetaZOLAMIDE  500 mg Oral BID   apixaban  2.5 mg Oral BID   Chlorhexidine Gluconate Cloth  6 each Topical Daily   feeding supplement  237 mL Oral BID BM   insulin aspart  0-15 Units Subcutaneous TID WC   insulin glargine  6 Units Subcutaneous Daily   lidocaine  1 patch Transdermal Q24H   midodrine  10 mg  Oral Q8H   mometasone-formoterol  2 puff Inhalation BID   polyethylene glycol  17 g Oral Daily   senna-docusate  1 tablet Oral BID   sodium chloride flush  10-40 mL Intracatheter Q12H   sodium chloride flush  3 mL Intravenous Q12H    Infusions:  amiodarone 30 mg/hr (06/10/23 0800)   furosemide     milrinone 0.375 mcg/kg/min (06/10/23 0800)   norepinephrine (LEVOPHED) Adult infusion 12 mcg/min (06/10/23 0800)   sodium chloride (hypertonic) 40 mL/hr at 06/10/23 0800    PRN Medications: acetaminophen **OR** acetaminophen, albuterol, alum & mag hydroxide-simeth, Muscle Rub, mouth rinse, sodium chloride flush, trimethobenzamide  Assessment/Plan   1. Acute on chronic systolic CHF -> cardiogenic shock:  - Cardiolite 9/20 sEF 43%, ischemic cardiomyopathy with anteroapical infarction.  - Echo 2/25 EF 20-25% in setting of AF/RVR in 2/25, - admitted with profound shock with LA 7.2 and shock liver/kidney - started on milrinone. NE later added for BP support.  - Remains on mirlinone 0.375 + NE 13. Amio gtt 30. - Will attempt to wean vasodilators and start midodrine in an attempt to potentially get home with hospice.  - End stage cardiomyopathy, goal to get home with hospice - Give lasix 200 IV BID plus acetazolamide 500mg  BID plus metolazone 10mg  x1  2. Atrial fibrillation: Persistent.  Successful DCCV in 2/25 but back in AF/RVR by 05/24/23.  He has been on po amiodarone. - Rates improved on amio. Cardiac output restored with inotropes - Amiodarone gtt at 30 mg/hr for now while on inotrope. No role for DC-CV  - Continue apixaban.   3. CAD: H/o CABG 2008 with LIMA-LAD, seq SVG-OM1/2, SVG-PDA.  No cath since CABG.  Cardiolite in 9/20 with anteroapical infarction.  - No chest pain.  HS-TnI minimally elevated with no trend, suspect demand ischemia from volume overload.  - not cath candidate with AKI  4. AKI on CKD stage 3:  Suspect cardiorenal syndrome with low output HF.   -Creatinine rising,  3.5 yesterday, lab pending - Urine sodium of <10 and low chloride despite elevated CVP suggest profound diuretic resistance and suboptimal response to IV diuresis  - Not HD canddiate  5. ID: Possible PNA on CXR.   - Completed empiric cefepime for possible PNA. Afebrile, WBC WNL now.   6. COPD: Prior smoker.   7. Hyponatremia: Hypervolemic hyponatremia. Suspect some degree of liver dysfunction as well, potentially cardiac  cirrhosis.  - Na 121 today, if trending up will need to stop hypertonic - Fluid restrict - No indication for further tolvaptan - continue 3% saline at 60/hr  8. Elevated LFTs: Suspect shock liver. LFTs trending down.  - AST 2704/ ALT 1514 on admit. Improving - continue inotropic support   9. DNR/DNI  Long discussion today, Will spend the next 24-48 hours aggressively diuresing with midodrine support for BP and weaning IV milrinone.   Palliative Care has seen today and discussions ongoing.   CRITICAL CARE Performed by: Romie Minus   Total critical care time: 50 minutes  Critical care time was exclusive of separately billable procedures and treating other patients.  Critical care was necessary to treat or prevent imminent or life-threatening deterioration.  Critical care was time spent personally by me on the following activities: development of treatment plan with patient and/or surrogate as well as nursing, discussions with consultants, evaluation of patient's response to treatment, examination of patient, obtaining history from patient or surrogate, ordering and performing treatments and interventions, ordering and review of laboratory studies, ordering and review of radiographic studies, pulse oximetry and re-evaluation of patient's condition.    Length of Stay: 10  Romie Minus, MD  06/10/2023, 10:59 AM  Advanced Heart Failure Team Pager [phone] (M-F; 7a - 5p)  Please contact [facility] for night-coverage after hours (5p -7a ) and  weekends on [email or web address]

## 2023-06-11 ENCOUNTER — Encounter (HOSPITAL_COMMUNITY): Payer: Medicare Other

## 2023-06-11 DIAGNOSIS — N183 Chronic kidney disease, stage 3 unspecified: Secondary | ICD-10-CM | POA: Diagnosis not present

## 2023-06-11 DIAGNOSIS — I5023 Acute on chronic systolic (congestive) heart failure: Secondary | ICD-10-CM | POA: Diagnosis not present

## 2023-06-11 DIAGNOSIS — R570 Cardiogenic shock: Secondary | ICD-10-CM | POA: Diagnosis not present

## 2023-06-11 DIAGNOSIS — N179 Acute kidney failure, unspecified: Secondary | ICD-10-CM | POA: Diagnosis not present

## 2023-06-13 ENCOUNTER — Encounter (HOSPITAL_COMMUNITY): Payer: Medicare Other

## 2023-06-18 ENCOUNTER — Encounter (HOSPITAL_COMMUNITY): Payer: Medicare Other

## 2023-06-18 ENCOUNTER — Ambulatory Visit (HOSPITAL_COMMUNITY): Admitting: Internal Medicine

## 2023-06-18 NOTE — Death Summary Note (Signed)
  Advanced Heart Failure Death Summary  Death Summary   Patient ID: EVON LOPEZPEREZ MRN: [id number], DOB/AGE: 1940/10/25 83 y.o. Admit date: 05/30/2023 D/C date:     06/14/2023   Primary Discharge Diagnoses:  Acute on chronic systolic heart failure, ACC/AHA Stage D biventricular failure  Hospital Course:   Patient presented on 3/13 with acute on chronic systolic heart failure complicated by renal failure, hyponatremia, and likely cirrhosis.  He was placed on high-dose inotropes and vasopressors with escalating doses of diuretics in an attempt to reverse his hyponatremia and renal dysfunction.  Given his age and frailty he was not a candidate for advanced therapies and given his ongoing hypotension was not a dialysis candidate.  His symptoms progressed and he began becoming short of breath at rest.  Palliative care had been consulted and after discussion with wife and patient the decision was made on 3/24 to pursue inpatient comfort care.  Given inability to come off vasopressors it was unlikely that he would make it home.  Patient was placed on a morphine drip for comfort, vasopressors were stopped, and after spending an evening with his wife he passed peacefully this morning at 0903. Autopsy was deferred.      Significant Diagnostic Studies None   Consultations  Palliative care Advanced Heart failure  Duration of Discharge Encounter: 40 minutes   Signed, Romie Minus, MD 05/25/2023, 9:25 AM

## 2023-06-18 NOTE — Plan of Care (Addendum)
   Palliative Medicine Inpatient Follow Up Note  I went to see [name] though was informed by nursing staff that he had passed away roughly 45 minutes prior to my arrival.   I offered patients spouse, [name] my condolences and support under the given circumstances.  No Charge ______________________________________________________________________________________ [name]  Palliative Medicine Team Team Cell Phone: [phone] Please utilize secure chat with additional questions, if there is no response within 30 minutes please call the above phone number  Palliative Medicine Team providers are available by phone from 7am to 7pm daily and can be reached through the team cell phone.  Should this patient require assistance outside of these hours, please call the patient's attending physician.

## 2023-06-18 DEATH — deceased

## 2023-06-20 ENCOUNTER — Encounter (HOSPITAL_COMMUNITY): Payer: Medicare Other

## 2023-06-25 ENCOUNTER — Encounter (HOSPITAL_COMMUNITY): Payer: Medicare Other

## 2023-06-27 ENCOUNTER — Encounter (HOSPITAL_COMMUNITY): Payer: Medicare Other

## 2023-07-18 ENCOUNTER — Other Ambulatory Visit (HOSPITAL_COMMUNITY): Payer: Medicare Other

## 2023-07-22 ENCOUNTER — Ambulatory Visit (HOSPITAL_BASED_OUTPATIENT_CLINIC_OR_DEPARTMENT_OTHER): Payer: Medicare Other | Admitting: Pulmonary Disease

## 2023-07-23 ENCOUNTER — Telehealth (HOSPITAL_COMMUNITY): Payer: Self-pay | Admitting: Cardiology

## 2023-07-23 ENCOUNTER — Encounter (HOSPITAL_COMMUNITY): Payer: Self-pay

## 2023-07-23 ENCOUNTER — Other Ambulatory Visit (HOSPITAL_COMMUNITY): Payer: Medicare Other

## 2023-07-23 NOTE — Telephone Encounter (Signed)
 Patients wife called to request to speak directly with provider (Dr Alease Amend)  Requests a quick conversation for peace of mind after recent passing of husband -would like to know what factors or what caused husband become comfort care only     [name] [phone]

## 2023-08-07 ENCOUNTER — Ambulatory Visit: Payer: Medicare Other | Admitting: Emergency Medicine

## 2023-09-18 ENCOUNTER — Ambulatory Visit: Admitting: Nurse Practitioner
